# Patient Record
Sex: Female | Born: 1970
Health system: Southern US, Community
[De-identification: ages and names within clinical notes are randomized; demographics above are authoritative.]

## PROBLEM LIST (undated history)

## (undated) DIAGNOSIS — F988 Other specified behavioral and emotional disorders with onset usually occurring in childhood and adolescence: Secondary | ICD-10-CM

## (undated) DIAGNOSIS — Z973 Presence of spectacles and contact lenses: Secondary | ICD-10-CM

## (undated) DIAGNOSIS — C170 Malignant neoplasm of duodenum: Secondary | ICD-10-CM

## (undated) DIAGNOSIS — F419 Anxiety disorder, unspecified: Secondary | ICD-10-CM

## (undated) DIAGNOSIS — Z8489 Family history of other specified conditions: Secondary | ICD-10-CM

## (undated) HISTORY — PX: PERINEAL LACERATION REPAIR: SHX5389

---

## 2000-01-11 ENCOUNTER — Encounter: Payer: Self-pay | Admitting: Family Medicine

## 2000-01-11 ENCOUNTER — Ambulatory Visit (HOSPITAL_COMMUNITY): Admission: RE | Admit: 2000-01-11 | Discharge: 2000-01-11 | Payer: Self-pay | Admitting: Family Medicine

## 2000-02-29 ENCOUNTER — Other Ambulatory Visit: Admission: RE | Admit: 2000-02-29 | Discharge: 2000-02-29 | Payer: Self-pay | Admitting: Obstetrics and Gynecology

## 2004-11-27 ENCOUNTER — Inpatient Hospital Stay (HOSPITAL_COMMUNITY): Admission: AD | Admit: 2004-11-27 | Discharge: 2004-11-27 | Payer: Self-pay | Admitting: Obstetrics and Gynecology

## 2004-12-27 ENCOUNTER — Inpatient Hospital Stay (HOSPITAL_COMMUNITY): Admission: AD | Admit: 2004-12-27 | Discharge: 2004-12-29 | Payer: Self-pay | Admitting: *Deleted

## 2004-12-30 ENCOUNTER — Encounter: Admission: RE | Admit: 2004-12-30 | Discharge: 2005-01-29 | Payer: Self-pay | Admitting: Obstetrics and Gynecology

## 2005-01-27 ENCOUNTER — Other Ambulatory Visit: Admission: RE | Admit: 2005-01-27 | Discharge: 2005-01-27 | Payer: Self-pay | Admitting: Obstetrics and Gynecology

## 2007-06-25 ENCOUNTER — Inpatient Hospital Stay (HOSPITAL_COMMUNITY): Admission: AD | Admit: 2007-06-25 | Discharge: 2007-06-27 | Payer: Self-pay | Admitting: Obstetrics and Gynecology

## 2007-06-28 ENCOUNTER — Encounter: Admission: RE | Admit: 2007-06-28 | Discharge: 2007-07-25 | Payer: Self-pay | Admitting: Obstetrics & Gynecology

## 2007-07-26 ENCOUNTER — Encounter: Admission: RE | Admit: 2007-07-26 | Discharge: 2007-08-25 | Payer: Self-pay | Admitting: Obstetrics & Gynecology

## 2010-10-01 NOTE — Discharge Summary (Signed)
NAMEMarland Houston  DALLAS, SCORSONE NO.:  [id number]   MEDICAL RECORD NO.:  [id number]          PATIENT TYPE:  INP   LOCATION:  9110                          FACILITY:  [facility]   PHYSICIAN:  Malva Limes, M.D.    DATE OF BIRTH:  [birth year]   DATE OF ADMISSION:  12/27/2004  DATE OF DISCHARGE:  12/29/2004                                 DISCHARGE SUMMARY   FINAL DIAGNOSES:  1.  Intrauterine pregnancy at 68 and one-seventh weeks gestation.  2.  Uncomplicated spontaneous vaginal delivery of a 7-pound 5-ounce female      infant with Apgars of 9 and 9.  3.  Postpartum bleeding of left labial laceration and uterine atony.   PROCEDURES:  1.  Delivery performed by Dr. Conley Simmonds.  2.  Manual uterine exploration, suturing left labial laceration in the OR,      Dr. Conley Simmonds.   COMPLICATIONS:  None.   This 40 year old G1 P0 was admitted on December 27, 2004 at 83 and one-seventh  weeks gestation in active labor. The patient's antepartum course up to that  point had been uncomplicated. She had a negative group B strep culture  obtained in the office at 35 weeks. She was admitted at this time. She had  spontaneous vaginal delivery of a 7-pound 5-ounce female infant with Apgars of  9 and 9. The delivery went without complications. At that point, a bilateral  labial laceration was repaired. Dr. Edward Jolly was called later that day. The  patient was still having heavy vaginal bleeding with uterine atony and had  clots per vagina. The patient did receive some Pitocin and did have some  improvement but there was still an excess of blood. On examination there was  no laceration of the cervix, no clots in the vagina, but she was bleeding  from the left labial laceration site. She was taken to the OR at this time  to be examined under anesthesia. The patient was diagnosed with a left  labial vaginal laceration. There was a small hematoma forming in the left  labia and she was taken to the operating room  where manual uterine  exploration was performed with the absence of products of conception from  within the uterine cavity. There was a small clot removed. The cervix was  intact. The left labial laceration was repaired and this provided excellent  hemostasis. The patient's postpartum and postoperative courses were benign.  Her hemoglobin did drop to 8.2. She was felt ready for discharge on  postoperative day #2. She was sent home on a regular diet, told to decrease  activities, told to continue her prenatal vitamins. She needed to add an  iron supplement 325 milligrams twice daily, was given Percocet one to two  every 4 hours as needed for pain, told she could use over-the-counter  ibuprofen up to 600 milligrams every 6 hours as needed for pain. Was to  follow up in the office in 4 weeks.   LABORATORY ON DISCHARGE:  The patient had a hemoglobin of 8.2; white blood  cell count of 13.3; and platelets of 221,000.  Leilani Able, P.A.-C.    ______________________________  Malva Limes, M.D.    MB/MEDQ  D:  02/03/2005  T:  02/03/2005  Job:  [id number]

## 2010-10-01 NOTE — Op Note (Signed)
NAME:  Hailey Houston, Hailey Houston NO.:  [id number]   MEDICAL RECORD NO.:  [id number]          PATIENT TYPE:  INP   LOCATION:  9110                          FACILITY:  [facility]   PHYSICIAN:  Randye Lobo, M.D.   DATE OF BIRTH:  08-26-1970   DATE OF PROCEDURE:  12/27/2004  DATE OF DISCHARGE:                                 OPERATIVE REPORT   PREOPERATIVE DIAGNOSES:  1.  Status post spontaneous vaginal delivery.  2.  Bleeding left vaginal labial laceration.  3.  Uterine atony.   POSTOPERATIVE DIAGNOSES:  1.  Status post normal spontaneous vaginal delivery.  2.  Bleeding left vaginal labial laceration.  3.  Uterine atony.   PROCEDURES:  1.  Examination under anesthesia.  2.  Manual uterine exploration.  3.  Suturing of left vaginal labial laceration.   SURGEON:  Conley Simmonds, M.D.   ANESTHESIA:  IV sedation.   ESTIMATED BLOOD LOSS:  300 mL  of clot per vagina at the beginning of the  procedure.   URINE OUTPUT:  300 mL  by I&O catheterization.   COMPLICATIONS:  None.   INDICATIONS FOR PROCEDURE:  The patient is a 40 year old gravida 1, para 2,  Caucasian female admitted on December 27, 2004, at 24 plus one weeks'  gestation, who had a normal spontaneous vaginal delivery of a viable female at  47 with Apgars of 9 at one minute and 9 at five minutes, status post  bilateral labial laceration repair with 2-0 Vicryl, who was noted to have  uterine atony with vaginal bleeding and clots per vagina later that evening.  The patient did receive Pitocin intravenously and had some improvement of  the bleeding but still had a constant vaginal flow of blood.  The patient  was feeling well.   Upon examination the patient had a temperature of 99 degrees Fahrenheit and  vital signs which were stable.  The patient was examined vaginally, and a  ring forceps was placed on the anterior cervical lip.  There were no cervical lacerations.  The perineum was noted to be intact.  The right  vaginal laceration site was hemostatic.  The left labial  laceration site was examined and was found to have an arteriolar bleeder.   The patient was diagnosed with a bleeding left labial vaginal laceration,  and the plan was made to perform suturing at the bedside.  Local 1%  lidocaine was injected to the left labia and a series of figure-of-eight and  simple interrupted sutures of 2-0 Vicryl were placed.  Bleeding continued at  this time.  There was a small hematoma forming of the left labia.   A decision was made at this time to proceed with an examination under  anesthesia along with suturing of the left vaginal labial laceration after  risks and benefits were reviewed.   FINDINGS:  Manual uterine exploration at the beginning of the patient's  procedure documented the absence of products of conception from within the  uterine cavity.  A very small amount of clot was removed.  The cervix was  noted to be intact.  The left vaginal labial laceration was noted to be  bleeding as had been previously diagnosed in the patient's postpartum room.   SPECIMEN:  None.   PROCEDURE:  The patient was escorted from her postpartum room down to the  operating room.  The patient was placed in the dorsal lithotomy position and  IV sedation was administered.  The patient's vagina and perineum were then  sterilely prepped and the bladder was sterilely catheterized of urine.  She  was then sterilely draped.   A manual uterine exploration was performed first, and the findings ere as  noted above.  The cervix was then visualized with a ring forceps, and there  was no evidence of any bleeding from the cervix itself.  A series of figure-  of-eight and interrupted sutures of #2 chromic were placed along the left  labial vaginal laceration site.  This provided excellent hemostasis.   The patient was observed following completion of the suturing, and there was  no further bleeding appreciated.   This  concluded the patient's surgery.  There were no complications.  She is  escorted to the recovery room in stable and awake condition.  All needle,  instrument, sponge counts were correct.      Randye Lobo, M.D.  Electronically Signed     BES/MEDQ  D:  12/27/2004  T:  12/28/2004  Job:  [id number]

## 2011-02-04 LAB — CBC
HCT: 28.3 — ABNORMAL LOW
HCT: 34 — ABNORMAL LOW
Hemoglobin: 11.6 — ABNORMAL LOW
Hemoglobin: 9.7 — ABNORMAL LOW
MCHC: 34
MCHC: 34.1
MCV: 91.5
MCV: 92.1
Platelets: 262
Platelets: 327
RBC: 3.1 — ABNORMAL LOW
RBC: 3.69 — ABNORMAL LOW
RDW: 13.7
RDW: 13.9
WBC: 12.3 — ABNORMAL HIGH
WBC: 14.8 — ABNORMAL HIGH

## 2011-02-04 LAB — RPR: RPR Ser Ql: NONREACTIVE

## 2013-08-26 ENCOUNTER — Emergency Department (HOSPITAL_COMMUNITY)
Admission: EM | Admit: 2013-08-26 | Discharge: 2013-08-26 | Disposition: A | Discharge status: Home / Self Care | Payer: BC Managed Care – PPO | Attending: Emergency Medicine | Admitting: Emergency Medicine

## 2013-08-26 ENCOUNTER — Emergency Department (HOSPITAL_COMMUNITY): Payer: BC Managed Care – PPO

## 2013-08-26 ENCOUNTER — Encounter (HOSPITAL_COMMUNITY): Payer: Self-pay | Admitting: Emergency Medicine

## 2013-08-26 DIAGNOSIS — W010XXA Fall on same level from slipping, tripping and stumbling without subsequent striking against object, initial encounter: Secondary | ICD-10-CM | POA: Insufficient documentation

## 2013-08-26 DIAGNOSIS — F17200 Nicotine dependence, unspecified, uncomplicated: Secondary | ICD-10-CM | POA: Insufficient documentation

## 2013-08-26 DIAGNOSIS — S82209A Unspecified fracture of shaft of unspecified tibia, initial encounter for closed fracture: Secondary | ICD-10-CM | POA: Insufficient documentation

## 2013-08-26 DIAGNOSIS — S82202A Unspecified fracture of shaft of left tibia, initial encounter for closed fracture: Secondary | ICD-10-CM

## 2013-08-26 DIAGNOSIS — Y939 Activity, unspecified: Secondary | ICD-10-CM | POA: Insufficient documentation

## 2013-08-26 DIAGNOSIS — R110 Nausea: Secondary | ICD-10-CM | POA: Insufficient documentation

## 2013-08-26 DIAGNOSIS — Z79899 Other long term (current) drug therapy: Secondary | ICD-10-CM | POA: Insufficient documentation

## 2013-08-26 DIAGNOSIS — S82409A Unspecified fracture of shaft of unspecified fibula, initial encounter for closed fracture: Principal | ICD-10-CM

## 2013-08-26 DIAGNOSIS — R42 Dizziness and giddiness: Secondary | ICD-10-CM | POA: Insufficient documentation

## 2013-08-26 DIAGNOSIS — Y92009 Unspecified place in unspecified non-institutional (private) residence as the place of occurrence of the external cause: Secondary | ICD-10-CM | POA: Insufficient documentation

## 2013-08-26 DIAGNOSIS — S82402A Unspecified fracture of shaft of left fibula, initial encounter for closed fracture: Secondary | ICD-10-CM

## 2013-08-26 DIAGNOSIS — F988 Other specified behavioral and emotional disorders with onset usually occurring in childhood and adolescence: Secondary | ICD-10-CM | POA: Insufficient documentation

## 2013-08-26 DIAGNOSIS — S9000XA Contusion of unspecified ankle, initial encounter: Secondary | ICD-10-CM | POA: Insufficient documentation

## 2013-08-26 HISTORY — DX: Other specified behavioral and emotional disorders with onset usually occurring in childhood and adolescence: F98.8

## 2013-08-26 MED ORDER — FENTANYL CITRATE 0.05 MG/ML IJ SOLN
100.0000 ug | Freq: Once | INTRAMUSCULAR | Status: AC
  Administered 2013-08-26: 100 ug via INTRAVENOUS
  Filled 2013-08-26: qty 2

## 2013-08-26 MED ORDER — FENTANYL CITRATE 0.05 MG/ML IJ SOLN
50.0000 ug | Freq: Once | INTRAMUSCULAR | Status: AC
  Administered 2013-08-26: 50 ug via INTRAVENOUS
  Filled 2013-08-26: qty 2

## 2013-08-26 MED ORDER — HYDROMORPHONE HCL PF 1 MG/ML IJ SOLN
1.0000 mg | Freq: Once | INTRAMUSCULAR | Status: AC
  Administered 2013-08-26: 1 mg via INTRAVENOUS
  Filled 2013-08-26: qty 1

## 2013-08-26 MED ORDER — HYDROMORPHONE HCL PF 1 MG/ML IJ SOLN
1.0000 mg | INTRAMUSCULAR | Status: AC | PRN
  Administered 2013-08-26: 1 mg via INTRAVENOUS
  Filled 2013-08-26: qty 1

## 2013-08-26 MED ORDER — OXYCODONE-ACETAMINOPHEN 5-325 MG PO TABS: 2.0000 | ORAL_TABLET | ORAL | Status: DC | PRN

## 2013-08-26 MED ORDER — OXYCODONE-ACETAMINOPHEN 5-325 MG PO TABS
2.0000 | ORAL_TABLET | Freq: Once | ORAL | Status: AC
  Administered 2013-08-26: 2 via ORAL
  Filled 2013-08-26: qty 2

## 2013-08-26 NOTE — ED Notes (Signed)
Patient slipped on porch and fell, has obvious deformity to left ankle

## 2013-08-26 NOTE — ED Provider Notes (Addendum)
Medical screening examination/treatment/procedure(s) were conducted as a shared visit with non-physician practitioner(s) and myself.  I personally evaluated the patient during the encounter.   EKG Interpretation None      Dr. Percell Miller evaluated xrays and recommends action during splinting with pain medicine. This was performed by me in good anatomical alignment was achieved. The patient remained or vascular intact. Her compartments are soft. Her pain did improve somewhat with IV pain medications and reduction. It was then splinted by orthostatics. Dr. Percell Miller evaluated the reduction xrays and will see the patient in 2 days for surgery. The patient feels that her pain is controlled well enough that she shouldn't take oral meds at home. I discussed her to return precautions regarding worsening pain or concerns for compartment syndrome. The patient understands these will return if symptoms worsen.  Reduction of dislocation Date/Time: 10:45 PM Performed by: Ephraim Hamburger Authorized by: Ephraim Hamburger Consent: Verbal consent obtained. Risks and benefits: risks, benefits and alternatives were discussed Consent given by: patient Required items: required blood products, implants, devices, and special equipment available Time out: Immediately prior to procedure a "time out" was called to verify the correct patient, procedure, equipment, support staff and site/side marked as required.  Patient sedated: Dilaudid IV, total time 5 minutes  Vitals: Vital signs were monitored during sedation. Patient tolerance: Patient tolerated the procedure well with no immediate complications. Joint: Distal left tib/fib fracture with displacement above ankle joint Reduction technique: Traction    Ephraim Hamburger, MD 08/26/13 8325  Ephraim Hamburger, MD 09/04/13 1530

## 2013-08-26 NOTE — Progress Notes (Signed)
Orthopedic Tech Progress Note Patient Details:  Hailey Houston [birth year] [id number]  Ortho Devices Type of Ortho Device: Ace wrap;Crutches;Stirrup splint;Post (short leg) splint Ortho Device/Splint Location: LLE Ortho Device/Splint Interventions: Ordered;Application;Adjustment   Braulio Bosch 08/26/2013, 9:04 PM

## 2013-08-26 NOTE — Discharge Instructions (Signed)
Cast or Splint Care  Casts and splints support injured limbs and keep bones from moving while they heal. It is important to care for your cast or splint at home.   HOME CARE INSTRUCTIONS   Keep the cast or splint uncovered during the drying period. It can take 24 to 48 hours to dry if it is made of plaster. A fiberglass cast will dry in less than 1 hour.   Do not rest the cast on anything harder than a pillow for the first 24 hours.   Do not put weight on your injured limb or apply pressure to the cast until your health care provider gives you permission.   Keep the cast or splint dry. Wet casts or splints can lose their shape and may not support the limb as well. A wet cast that has lost its shape can also create harmful pressure on your skin when it dries. Also, wet skin can become infected.   Cover the cast or splint with a plastic bag when bathing or when out in the rain or snow. If the cast is on the trunk of the body, take sponge baths until the cast is removed.   If your cast does become wet, dry it with a towel or a blow dryer on the cool setting only.   Keep your cast or splint clean. Soiled casts may be wiped with a moistened cloth.   Do not place any hard or soft foreign objects under your cast or splint, such as cotton, toilet paper, lotion, or powder.   Do not try to scratch the skin under the cast with any object. The object could get stuck inside the cast. Also, scratching could lead to an infection. If itching is a problem, use a blow dryer on a cool setting to relieve discomfort.   Do not trim or cut your cast or remove padding from inside of it.   Exercise all joints next to the injury that are not immobilized by the cast or splint. For example, if you have a long leg cast, exercise the hip joint and toes. If you have an arm cast or splint, exercise the shoulder, elbow, thumb, and fingers.   Elevate your injured arm or leg on 1 or 2 pillows for the first 1 to 3 days to decrease  swelling and pain.It is best if you can comfortably elevate your cast so it is higher than your heart.  SEEK MEDICAL CARE IF:    Your cast or splint cracks.   Your cast or splint is too tight or too loose.   You have unbearable itching inside the cast.   Your cast becomes wet or develops a soft spot or area.   You have a bad smell coming from inside your cast.   You get an object stuck under your cast.   Your skin around the cast becomes red or raw.   You have new pain or worsening pain after the cast has been applied.  SEEK IMMEDIATE MEDICAL CARE IF:    You have fluid leaking through the cast.   You are unable to move your fingers or toes.   You have discolored (blue or white), cool, painful, or very swollen fingers or toes beyond the cast.   You have tingling or numbness around the injured area.   You have severe pain or pressure under the cast.   You have any difficulty with your breathing or have shortness of breath.   You have chest 

## 2013-08-26 NOTE — ED Notes (Signed)
Pt told this RN that she drank vodka before coming to hospital for pain control from fall. PA aware.

## 2013-08-26 NOTE — ED Provider Notes (Signed)
CSN: [id number]     Arrival date & time 08/26/13  1815 History   First MD Initiated Contact with Patient 08/26/13 1833     Chief Complaint  Patient presents with  . Ankle Pain     (Consider location/radiation/quality/duration/timing/severity/associated sxs/prior Treatment) HPI Comments: Pt is a 43 y/o female who presents to the ED complaining of left ankle pain after slipping off her porch and falling onto her leg. States her pain is severe, worse with any movement. She has not had any medication prior to arrival but did have less than an ounce of vodka to try to ease the pain. States when she looks down at her ankle looks deformed, swollen and bruised. She is feeling lightheaded and nauseated. Denies numbness. Denies knee pain. States she slipped and fell 1 week ago and bruised her lower leg.  Patient is a 43 y.o. female presenting with ankle pain. The history is provided by the patient.  Ankle Pain   Past Medical History  Diagnosis Date  . ADD (attention deficit disorder)    History reviewed. No pertinent past surgical history. History reviewed. No pertinent family history. History  Substance Use Topics  . Smoking status: Current Every Day Smoker  . Smokeless tobacco: Not on file  . Alcohol Use: Yes   OB History   Grav Para Term Preterm Abortions TAB SAB Ect Mult Living                 Review of Systems  Gastrointestinal: Positive for nausea.  Musculoskeletal:       Positive for left ankle pain and swelling.  Neurological: Positive for light-headedness.  All other systems reviewed and are negative.     Allergies  Review of patient's allergies indicates no known allergies.  Home Medications   Current Outpatient Rx  Name  Route  Sig  Dispense  Refill  . ALPRAZolam (XANAX) 0.5 MG tablet   Oral   Take 0.5 mg by mouth 2 (two) times daily as needed for anxiety.         Marland Kitchen amphetamine-dextroamphetamine (ADDERALL XR) 30 MG 24 hr capsule   Oral   Take 30 mg by mouth  every morning. Take 30mg  xr in the mornings, and regular 30mg  in the evening twice a day         . amphetamine-dextroamphetamine (ADDERALL) 30 MG tablet   Oral   Take 30 mg by mouth 2 (two) times daily.         . cetirizine (ZYRTEC) 10 MG tablet   Oral   Take 10 mg by mouth daily as needed for allergies.         Marland Kitchen levonorgestrel (MIRENA) 20 MCG/24HR IUD   Intrauterine   1 each by Intrauterine route once.         . venlafaxine (EFFEXOR) 75 MG tablet   Oral   Take 75 mg by mouth 4 (four) times daily.         . zaleplon (SONATA) 10 MG capsule   Oral   Take 10 mg by mouth at bedtime as needed for sleep.         Marland Kitchen oxyCODONE-acetaminophen (PERCOCET) 5-325 MG per tablet   Oral   Take 2 tablets by mouth every 4 (four) hours as needed.   30 tablet   0    BP 118/69  Pulse 105  Temp(Src) 98.2 F (36.8 C) (Oral)  Resp 18  Ht 5\' 11"  (1.803 m)  Wt 180 lb (81.647 kg)  BMI  25.12 kg/m2  SpO2 97%  LMP 02/26/2007 Physical Exam  Nursing note and vitals reviewed. Constitutional: She is oriented to person, place, and time. She appears well-developed and well-nourished. She appears distressed.  HENT:  Head: Normocephalic and atraumatic.  Mouth/Throat: Oropharynx is clear and moist.  Eyes: Conjunctivae are normal.  Neck: Normal range of motion. Neck supple.  Cardiovascular: Normal rate, regular rhythm and normal heart sounds.   Pulses:      Dorsalis pedis pulses are 2+ on the left side.  Pulmonary/Chest: Effort normal and breath sounds normal.  Musculoskeletal:  Left foot rotated outward with deformity at ankle, overlying bruising and swelling. Tenderness to palpation of entire ankle. TTP lower leg. Bruising noted proximal tibia.  Neurological: She is alert and oriented to person, place, and time.  Skin: Skin is warm and dry. She is not diaphoretic.  Psychiatric: She has a normal mood and affect. Her behavior is normal.    ED Course  Procedures (including critical care  time) Labs Review Labs Reviewed - No data to display Imaging Review Dg Tibia/fibula Left Port  08/26/2013   CLINICAL DATA:  Recent injury with pain  EXAM: PORTABLE LEFT TIBIA AND FIBULA - 2 VIEW  COMPARISON:  None.  FINDINGS: Comminuted fractures of the distal tibia and fibula are noted. Some lateral displacement of the distal fracture fragments is seen. Additionally posterior angulation of the distal fracture fragments is noted. No other focal abnormality is seen.  IMPRESSION: Distal left tibial and fibular fractures.   Electronically Signed   By: Inez Catalina M.D.   On: 08/26/2013 20:43   Dg Ankle Left Port  08/26/2013   CLINICAL DATA:  Postreduction film  EXAM: PORTABLE LEFT ANKLE - 2 VIEW  COMPARISON:  DG TIBIA/FIBULA*L*PORT dated 08/26/2013  FINDINGS: There is an oblique comminuted distal left tibial diametaphyseal fracture with improved alignment with minimal displacement. No significant angulation. There is a oblique comminuted displaced fracture of the left distal fibular diaphysis with 5 mm of lateral displacement and 9 mm of posterior displacement.  IMPRESSION: 1. Oblique comminuted distal left tibial diametaphyseal fracture with improved interval alignment. 2. Oblique comminuted displaced fracture of the left distal fibular diaphysis with improved alignment. There is persistent displacement. No significant angulation.   Electronically Signed   By: Kathreen Devoid   On: 08/26/2013 21:31   Dg Ankle Left Port  08/26/2013   CLINICAL DATA:  Pain and deformity secondary to a fall tonight.  EXAM: PORTABLE LEFT ANKLE - 2 VIEW  COMPARISON:  None.  FINDINGS: There are angulated displaced comminuted fractures of the distal shafts of the tibia and fibula. The fractures do not appear to involve the ankle joint.  IMPRESSION: Comminuted fractures of the distal shafts of the tibia and fibula.   Electronically Signed   By: Rozetta Nunnery M.D.   On: 08/26/2013 20:37     EKG Interpretation None      MDM    Final diagnoses:  Closed fracture of left tibia and fibula   Neurovascularly intact. Pain controlled with dilaudid. Awaiting ortho consult. Pt signed out to Dr. Regenia Skeeter at shift change.     Illene Labrador, PA-C 08/27/13 [phone]

## 2013-08-26 NOTE — ED Notes (Signed)
Pt states she hurt her L ankle falling down the stairs to go get the mail. Deformity and swelling present to L ankle.

## 2013-08-28 ENCOUNTER — Encounter (HOSPITAL_BASED_OUTPATIENT_CLINIC_OR_DEPARTMENT_OTHER): Payer: Self-pay | Admitting: *Deleted

## 2013-08-28 NOTE — H&P (Signed)
ORTHOPAEDIC CONSULTATION  REQUESTING PHYSICIAN: [name], MD  Chief Complaint: Left tibia fx, lat mal fx  HPI: Hailey Houston is a 43 y.o. female who complains of  pain  Past Medical History  Diagnosis Date  . ADD (attention deficit disorder)    No past surgical history on file. History   Social History  . Marital Status: Married    Spouse Name: N/A    Number of Children: N/A  . Years of Education: N/A   Social History Main Topics  . Smoking status: Current Every Day Smoker  . Smokeless tobacco: Not on file  . Alcohol Use: Yes  . Drug Use: No  . Sexual Activity: Not on file   Other Topics Concern  . Not on file   Social History Narrative  . No narrative on file   No family history on file. No Known Allergies Prior to Admission medications   Medication Sig Start Date End Date Taking? Authorizing Provider  ALPRAZolam Duanne Moron) 0.5 MG tablet Take 0.5 mg by mouth 2 (two) times daily as needed for anxiety.    Historical Provider, MD  amphetamine-dextroamphetamine (ADDERALL XR) 30 MG 24 hr capsule Take 30 mg by mouth every morning. Take 38m xr in the mornings, and regular 357min the evening twice a day    Historical Provider, MD  amphetamine-dextroamphetamine (ADDERALL) 30 MG tablet Take 30 mg by mouth 2 (two) times daily.    Historical Provider, MD  cetirizine (ZYRTEC) 10 MG tablet Take 10 mg by mouth daily as needed for allergies.    Historical Provider, MD  levonorgestrel (MIRENA) 20 MCG/24HR IUD 1 each by Intrauterine route once.    Historical Provider, MD  oxyCODONE-acetaminophen (PERCOCET) 5-325 MG per tablet Take 2 tablets by mouth every 4 (four) hours as needed. 08/26/13   ScEphraim HamburgerMD  venlafaxine (EFFEXOR) 75 MG tablet Take 75 mg by mouth 4 (four) times daily.    Historical Provider, MD  zaleplon (SONATA) 10 MG capsule Take 10 mg by mouth at bedtime as needed for sleep.    Historical Provider, MD   Dg Tibia/fibula Left Port  08/26/2013    CLINICAL DATA:  Recent injury with pain  EXAM: PORTABLE LEFT TIBIA AND FIBULA - 2 VIEW  COMPARISON:  None.  FINDINGS: Comminuted fractures of the distal tibia and fibula are noted. Some lateral displacement of the distal fracture fragments is seen. Additionally posterior angulation of the distal fracture fragments is noted. No other focal abnormality is seen.  IMPRESSION: Distal left tibial and fibular fractures.   Electronically Signed   By: MaInez Catalina.D.   On: 08/26/2013 20:43   Dg Ankle Left Port  08/26/2013   CLINICAL DATA:  Postreduction film  EXAM: PORTABLE LEFT ANKLE - 2 VIEW  COMPARISON:  DG TIBIA/FIBULA*L*PORT dated 08/26/2013  FINDINGS: There is an oblique comminuted distal left tibial diametaphyseal fracture with improved alignment with minimal displacement. No significant angulation. There is a oblique comminuted displaced fracture of the left distal fibular diaphysis with 5 mm of lateral displacement and 9 mm of posterior displacement.  IMPRESSION: 1. Oblique comminuted distal left tibial diametaphyseal fracture with improved interval alignment. 2. Oblique comminuted displaced fracture of the left distal fibular diaphysis with improved alignment. There is persistent displacement. No significant angulation.   Electronically Signed   By: HeKathreen Devoid On: 08/26/2013 21:31   Dg Ankle Left Port  08/26/2013   CLINICAL DATA:  Pain and deformity  secondary to a fall tonight.  EXAM: PORTABLE LEFT ANKLE - 2 VIEW  COMPARISON:  None.  FINDINGS: There are angulated displaced comminuted fractures of the distal shafts of the tibia and fibula. The fractures do not appear to involve the ankle joint.  IMPRESSION: Comminuted fractures of the distal shafts of the tibia and fibula.   Electronically Signed   By: Rozetta Nunnery M.D.   On: 08/26/2013 20:37    Positive ROS: All other systems have been reviewed and were otherwise negative with the exception of those mentioned in the HPI and as above.  Labs cbc No  results found for this basename: WBC, HGB, HCT, PLT,  in the last 72 hours  Labs inflam No results found for this basename: ESR, CRP,  in the last 72 hours  Labs coag No results found for this basename: INR, PT, PTT,  in the last 72 hours  No results found for this basename: NA, K, CL, CO2, GLUCOSE, BUN, CREATININE, CALCIUM,  in the last 72 hours  Physical Exam: There were no vitals filed for this visit. General: Alert, no acute distress Cardiovascular: No pedal edema Respiratory: No cyanosis, no use of accessory musculature GI: No organomegaly, abdomen is soft and non-tender Skin: No lesions in the area of chief complaint Neurologic: Sensation intact distally Psychiatric: Patient is competent for consent with normal mood and affect Lymphatic: No axillary or cervical lymphadenopathy  MUSCULOSKELETAL:  Toes NVI, splint intact Other extremities are atraumatic with painless ROM and NVI.  Assessment: Left tibia and lat mal fx  Plan: IMnail tibia, ORIF lat mal    [name], MD Cell [phone]   08/28/2013 1:58 PM

## 2013-08-28 NOTE — Progress Notes (Signed)
Pt has been in a lot of pain-will bring all meds overnight bag and plan to stay-mom coming from [facility] will stay with her

## 2013-08-29 ENCOUNTER — Ambulatory Visit (HOSPITAL_BASED_OUTPATIENT_CLINIC_OR_DEPARTMENT_OTHER)
Admission: RE | Admit: 2013-08-29 | Discharge: 2013-08-30 | Disposition: A | Discharge status: Home / Self Care | Payer: BC Managed Care – PPO | Source: Ambulatory Visit | Attending: Orthopedic Surgery | Admitting: Orthopedic Surgery

## 2013-08-29 ENCOUNTER — Encounter (HOSPITAL_BASED_OUTPATIENT_CLINIC_OR_DEPARTMENT_OTHER): Payer: Self-pay | Admitting: *Deleted

## 2013-08-29 ENCOUNTER — Ambulatory Visit (HOSPITAL_BASED_OUTPATIENT_CLINIC_OR_DEPARTMENT_OTHER): Payer: BC Managed Care – PPO | Admitting: Anesthesiology

## 2013-08-29 ENCOUNTER — Encounter (HOSPITAL_BASED_OUTPATIENT_CLINIC_OR_DEPARTMENT_OTHER): Payer: BC Managed Care – PPO | Admitting: Anesthesiology

## 2013-08-29 ENCOUNTER — Ambulatory Visit (HOSPITAL_COMMUNITY): Payer: BC Managed Care – PPO

## 2013-08-29 ENCOUNTER — Encounter (HOSPITAL_BASED_OUTPATIENT_CLINIC_OR_DEPARTMENT_OTHER)
Admission: RE | Disposition: A | Discharge status: Home / Self Care | Payer: Self-pay | Source: Ambulatory Visit | Attending: Orthopedic Surgery

## 2013-08-29 DIAGNOSIS — S82409A Unspecified fracture of shaft of unspecified fibula, initial encounter for closed fracture: Secondary | ICD-10-CM

## 2013-08-29 DIAGNOSIS — F17200 Nicotine dependence, unspecified, uncomplicated: Secondary | ICD-10-CM | POA: Insufficient documentation

## 2013-08-29 DIAGNOSIS — S82209A Unspecified fracture of shaft of unspecified tibia, initial encounter for closed fracture: Secondary | ICD-10-CM | POA: Diagnosis present

## 2013-08-29 DIAGNOSIS — F411 Generalized anxiety disorder: Secondary | ICD-10-CM | POA: Insufficient documentation

## 2013-08-29 DIAGNOSIS — S8263XA Displaced fracture of lateral malleolus of unspecified fibula, initial encounter for closed fracture: Secondary | ICD-10-CM | POA: Insufficient documentation

## 2013-08-29 DIAGNOSIS — X58XXXA Exposure to other specified factors, initial encounter: Secondary | ICD-10-CM | POA: Insufficient documentation

## 2013-08-29 DIAGNOSIS — F988 Other specified behavioral and emotional disorders with onset usually occurring in childhood and adolescence: Secondary | ICD-10-CM | POA: Insufficient documentation

## 2013-08-29 HISTORY — DX: Anxiety disorder, unspecified: F41.9

## 2013-08-29 HISTORY — PX: TIBIA IM NAIL INSERTION: SHX2516

## 2013-08-29 HISTORY — PX: ORIF ANKLE FRACTURE: SHX5408

## 2013-08-29 HISTORY — DX: Presence of spectacles and contact lenses: Z97.3

## 2013-08-29 LAB — POCT HEMOGLOBIN-HEMACUE: Hemoglobin: 12.2 g/dL (ref 12.0–15.0)

## 2013-08-29 SURGERY — OPEN REDUCTION INTERNAL FIXATION (ORIF) ANKLE FRACTURE
Anesthesia: Regional | Site: Leg Lower | Laterality: Left

## 2013-08-29 MED ORDER — BUPIVACAINE HCL 0.25 % IJ SOLN
INTRAMUSCULAR | Status: DC | PRN
  Administered 2013-08-29: 20 mL

## 2013-08-29 MED ORDER — FENTANYL CITRATE 0.05 MG/ML IJ SOLN
INTRAMUSCULAR | Status: AC
  Filled 2013-08-29: qty 2

## 2013-08-29 MED ORDER — VENLAFAXINE HCL 75 MG PO TABS
300.0000 mg | ORAL_TABLET | Freq: Every day | ORAL | Status: DC
  Administered 2013-08-29: 300 mg via ORAL

## 2013-08-29 MED ORDER — MIDAZOLAM HCL 2 MG/2ML IJ SOLN
INTRAMUSCULAR | Status: AC
  Filled 2013-08-29: qty 2

## 2013-08-29 MED ORDER — FENTANYL CITRATE 0.05 MG/ML IJ SOLN
50.0000 ug | INTRAMUSCULAR | Status: DC | PRN
  Administered 2013-08-29 (×2): 100 ug via INTRAVENOUS

## 2013-08-29 MED ORDER — ASPIRIN 81 MG PO CHEW
81.0000 mg | CHEWABLE_TABLET | Freq: Every day | ORAL | Status: DC
  Administered 2013-08-29: 81 mg via ORAL
  Filled 2013-08-29: qty 1

## 2013-08-29 MED ORDER — ACETAMINOPHEN 500 MG PO TABS
1000.0000 mg | ORAL_TABLET | Freq: Once | ORAL | Status: AC
Start: 2013-08-29 — End: 2013-08-29
  Administered 2013-08-29: 1000 mg via ORAL

## 2013-08-29 MED ORDER — PROPOFOL 10 MG/ML IV BOLUS
INTRAVENOUS | Status: AC
  Filled 2013-08-29: qty 20

## 2013-08-29 MED ORDER — DIAZEPAM 2 MG PO TABS
2.0000 mg | ORAL_TABLET | Freq: Four times a day (QID) | ORAL | Status: DC | PRN
  Administered 2013-08-29 – 2013-08-30 (×2): 2 mg via ORAL
  Filled 2013-08-29 (×2): qty 1

## 2013-08-29 MED ORDER — DEXAMETHASONE SODIUM PHOSPHATE 4 MG/ML IJ SOLN
INTRAMUSCULAR | Status: DC | PRN
  Administered 2013-08-29: 10 mg via INTRAVENOUS

## 2013-08-29 MED ORDER — METOCLOPRAMIDE HCL 5 MG/ML IJ SOLN: 5.0000 mg | Freq: Three times a day (TID) | INTRAMUSCULAR | Status: DC | PRN

## 2013-08-29 MED ORDER — BUPIVACAINE-EPINEPHRINE PF 0.5-1:200000 % IJ SOLN
INTRAMUSCULAR | Status: DC | PRN
  Administered 2013-08-29: 30 mL via PERINEURAL

## 2013-08-29 MED ORDER — CEFAZOLIN SODIUM-DEXTROSE 2-3 GM-% IV SOLR
2.0000 g | Freq: Four times a day (QID) | INTRAVENOUS | Status: AC
  Administered 2013-08-29 – 2013-08-30 (×3): 2 g via INTRAVENOUS

## 2013-08-29 MED ORDER — METOCLOPRAMIDE HCL 5 MG PO TABS: 5.0000 mg | ORAL_TABLET | Freq: Three times a day (TID) | ORAL | Status: DC | PRN

## 2013-08-29 MED ORDER — CEFAZOLIN SODIUM-DEXTROSE 2-3 GM-% IV SOLR
2.0000 g | INTRAVENOUS | Status: AC
  Administered 2013-08-29: 2 g via INTRAVENOUS

## 2013-08-29 MED ORDER — ASPIRIN 81 MG PO TABS: 81.0000 mg | ORAL_TABLET | Freq: Every day | ORAL | Status: DC

## 2013-08-29 MED ORDER — HYDROMORPHONE HCL PF 1 MG/ML IJ SOLN
0.2500 mg | INTRAMUSCULAR | Status: DC | PRN
  Administered 2013-08-29 (×2): 0.5 mg via INTRAVENOUS

## 2013-08-29 MED ORDER — FENTANYL CITRATE 0.05 MG/ML IJ SOLN
INTRAMUSCULAR | Status: DC | PRN
  Administered 2013-08-29 (×4): 25 ug via INTRAVENOUS
  Administered 2013-08-29: 100 ug via INTRAVENOUS
  Administered 2013-08-29 (×2): 25 ug via INTRAVENOUS

## 2013-08-29 MED ORDER — DEXTROSE-NACL 5-0.45 % IV SOLN: 100.0000 mL/h | INTRAVENOUS | Status: DC

## 2013-08-29 MED ORDER — BUPIVACAINE HCL (PF) 0.5 % IJ SOLN
INTRAMUSCULAR | Status: DC | PRN
  Administered 2013-08-29: 10 mL

## 2013-08-29 MED ORDER — PROMETHAZINE HCL 25 MG PO TABS
25.0000 mg | ORAL_TABLET | Freq: Four times a day (QID) | ORAL | Status: DC | PRN
  Administered 2013-08-30: 25 mg via ORAL
  Filled 2013-08-29: qty 1

## 2013-08-29 MED ORDER — HYDROCODONE-ACETAMINOPHEN 5-325 MG PO TABS: 2.0000 | ORAL_TABLET | ORAL | Status: DC | PRN

## 2013-08-29 MED ORDER — FENTANYL CITRATE 0.05 MG/ML IJ SOLN
INTRAMUSCULAR | Status: AC
  Filled 2013-08-29: qty 6

## 2013-08-29 MED ORDER — BUPIVACAINE HCL (PF) 0.25 % IJ SOLN
INTRAMUSCULAR | Status: AC
  Filled 2013-08-29: qty 30

## 2013-08-29 MED ORDER — CYCLOBENZAPRINE HCL 10 MG PO TABS
10.0000 mg | ORAL_TABLET | Freq: Three times a day (TID) | ORAL | Status: DC | PRN
  Administered 2013-08-30 (×2): 10 mg via ORAL
  Filled 2013-08-29 (×2): qty 1

## 2013-08-29 MED ORDER — CEFAZOLIN SODIUM-DEXTROSE 2-3 GM-% IV SOLR
INTRAVENOUS | Status: AC
  Filled 2013-08-29: qty 100

## 2013-08-29 MED ORDER — CEFAZOLIN SODIUM-DEXTROSE 2-3 GM-% IV SOLR
INTRAVENOUS | Status: AC
  Filled 2013-08-29: qty 50

## 2013-08-29 MED ORDER — ACETAMINOPHEN 500 MG PO TABS
ORAL_TABLET | ORAL | Status: AC
  Filled 2013-08-29: qty 2

## 2013-08-29 MED ORDER — DOCUSATE SODIUM 100 MG PO CAPS: 100.0000 mg | ORAL_CAPSULE | Freq: Two times a day (BID) | ORAL | Status: DC

## 2013-08-29 MED ORDER — PROPOFOL 10 MG/ML IV BOLUS
INTRAVENOUS | Status: DC | PRN
  Administered 2013-08-29: 200 mg via INTRAVENOUS
  Administered 2013-08-29: 50 mg via INTRAVENOUS
  Administered 2013-08-29 (×2): 30 mg via INTRAVENOUS

## 2013-08-29 MED ORDER — ONDANSETRON HCL 4 MG PO TABS: 4.0000 mg | ORAL_TABLET | Freq: Three times a day (TID) | ORAL | Status: DC | PRN

## 2013-08-29 MED ORDER — DIPHENHYDRAMINE HCL 25 MG PO TABS
25.0000 mg | ORAL_TABLET | Freq: Four times a day (QID) | ORAL | Status: DC | PRN
  Administered 2013-08-29 – 2013-08-30 (×2): 25 mg via ORAL
  Filled 2013-08-29 (×2): qty 1

## 2013-08-29 MED ORDER — ONDANSETRON HCL 4 MG/2ML IJ SOLN: 4.0000 mg | Freq: Four times a day (QID) | INTRAMUSCULAR | Status: DC | PRN

## 2013-08-29 MED ORDER — HYDROMORPHONE HCL PF 1 MG/ML IJ SOLN
INTRAMUSCULAR | Status: AC
  Filled 2013-08-29: qty 1

## 2013-08-29 MED ORDER — ONDANSETRON HCL 4 MG/2ML IJ SOLN
INTRAMUSCULAR | Status: DC | PRN
  Administered 2013-08-29: 4 mg via INTRAVENOUS

## 2013-08-29 MED ORDER — MIDAZOLAM HCL 2 MG/2ML IJ SOLN
1.0000 mg | INTRAMUSCULAR | Status: DC | PRN
  Administered 2013-08-29 (×2): 2 mg via INTRAVENOUS

## 2013-08-29 MED ORDER — LACTATED RINGERS IV SOLN
INTRAVENOUS | Status: DC
  Administered 2013-08-29 (×3): via INTRAVENOUS

## 2013-08-29 MED ORDER — OXYCODONE-ACETAMINOPHEN 5-325 MG PO TABS
1.0000 | ORAL_TABLET | ORAL | Status: DC | PRN
  Administered 2013-08-29 – 2013-08-30 (×4): 2 via ORAL
  Filled 2013-08-29 (×4): qty 2

## 2013-08-29 MED ORDER — ONDANSETRON HCL 4 MG PO TABS
4.0000 mg | ORAL_TABLET | Freq: Four times a day (QID) | ORAL | Status: DC | PRN
  Administered 2013-08-29: 4 mg via ORAL
  Filled 2013-08-29: qty 1

## 2013-08-29 MED ORDER — HYDROMORPHONE HCL PF 1 MG/ML IJ SOLN
0.5000 mg | INTRAMUSCULAR | Status: DC | PRN
  Administered 2013-08-29 – 2013-08-30 (×5): 1 mg via INTRAVENOUS
  Filled 2013-08-29 (×5): qty 1

## 2013-08-29 SURGICAL SUPPLY — 95 items
BANDAGE ELASTIC 4 VELCRO ST LF (GAUZE/BANDAGES/DRESSINGS) ×4 IMPLANT
BANDAGE ELASTIC 6 VELCRO ST LF (GAUZE/BANDAGES/DRESSINGS) ×4 IMPLANT
BANDAGE ESMARK 6X9 LF (GAUZE/BANDAGES/DRESSINGS) ×4 IMPLANT
BIT DRILL 2.5X125 (BIT) ×8 IMPLANT
BIT DRILL 3.5X125 (BIT) ×2 IMPLANT
BIT DRILL AO GAMMA 4.2X340 (BIT) ×4 IMPLANT
BLADE SURG 15 STRL LF DISP TIS (BLADE) ×4 IMPLANT
BLADE SURG 15 STRL SS (BLADE) ×4
BNDG COHESIVE 4X5 TAN STRL (GAUZE/BANDAGES/DRESSINGS) ×4 IMPLANT
BNDG ESMARK 6X9 LF (GAUZE/BANDAGES/DRESSINGS) ×8
CHLORAPREP W/TINT 26ML (MISCELLANEOUS) ×4 IMPLANT
CLOSURE WOUND 1/2 X4 (GAUZE/BANDAGES/DRESSINGS) ×1
COVER MAYO STAND STRL (DRAPES) ×4 IMPLANT
COVER TABLE BACK 60X90 (DRAPES) ×4 IMPLANT
CUFF TOURNIQUET SINGLE 24IN (TOURNIQUET CUFF) IMPLANT
CUFF TOURNIQUET SINGLE 34IN LL (TOURNIQUET CUFF) ×4 IMPLANT
DECANTER SPIKE VIAL GLASS SM (MISCELLANEOUS) IMPLANT
DRAPE C-ARM 42X72 X-RAY (DRAPES) IMPLANT
DRAPE C-ARMOR (DRAPES) ×4 IMPLANT
DRAPE EXTREMITY T 121X128X90 (DRAPE) ×4 IMPLANT
DRAPE OEC MINIVIEW 54X84 (DRAPES) IMPLANT
DRAPE U 20/CS (DRAPES) ×4 IMPLANT
DRAPE U-SHAPE 47X51 STRL (DRAPES) ×4 IMPLANT
DRAPE UTILITY 15X26 W/TAPE STR (DRAPE) ×4 IMPLANT
DRILL BIT 3.5X125 (BIT) ×2
DRILL, AO ×4 IMPLANT
DRSG EMULSION OIL 3X3 NADH (GAUZE/BANDAGES/DRESSINGS) ×4 IMPLANT
ELECT REM PT RETURN 9FT ADLT (ELECTROSURGICAL) ×4
ELECTRODE REM PT RTRN 9FT ADLT (ELECTROSURGICAL) ×2 IMPLANT
FACESHIELD LNG OPTICON STERILE (SAFETY) IMPLANT
GLOVE BIO SURGEON STRL SZ 6.5 (GLOVE) ×3 IMPLANT
GLOVE BIO SURGEON STRL SZ7.5 (GLOVE) ×4 IMPLANT
GLOVE BIO SURGEONS STRL SZ 6.5 (GLOVE) ×1
GLOVE BIOGEL M STRL SZ7.5 (GLOVE) IMPLANT
GLOVE BIOGEL PI IND STRL 7.0 (GLOVE) ×2 IMPLANT
GLOVE BIOGEL PI IND STRL 8 (GLOVE) ×4 IMPLANT
GLOVE BIOGEL PI INDICATOR 7.0 (GLOVE) ×2
GLOVE BIOGEL PI INDICATOR 8 (GLOVE) ×4
GLOVE INDICATOR 7.5 STRL GRN (GLOVE) ×4 IMPLANT
GLOVE SURG SS PI 7.0 STRL IVOR (GLOVE) ×4 IMPLANT
GLOVE SURG SS PI 7.5 STRL IVOR (GLOVE) ×4 IMPLANT
GOWN BRE IMP PREV XXLGXLNG (GOWN DISPOSABLE) ×4 IMPLANT
GOWN PREVENTION PLUS LG XLONG (DISPOSABLE) ×4 IMPLANT
GOWN STRL REIN XL XLG (GOWN DISPOSABLE) ×4 IMPLANT
GOWN STRL REUS W/ TWL LRG LVL3 (GOWN DISPOSABLE) ×8 IMPLANT
GOWN STRL REUS W/ TWL XL LVL3 (GOWN DISPOSABLE) ×2 IMPLANT
GOWN STRL REUS W/TWL LRG LVL3 (GOWN DISPOSABLE) ×8
GOWN STRL REUS W/TWL XL LVL3 (GOWN DISPOSABLE) ×2
GUIDEWIRE GAMMA (WIRE) ×4 IMPLANT
GUIDEWIRE GAMMA 800 (WIRE) ×4 IMPLANT
NAIL TIBIAL STD 9X345MM (Nail) ×4 IMPLANT
NEEDLE HYPO 22GX1.5 SAFETY (NEEDLE) IMPLANT
NS IRRIG 1000ML POUR BTL (IV SOLUTION) ×4 IMPLANT
PACK BASIN DAY SURGERY FS (CUSTOM PROCEDURE TRAY) ×4 IMPLANT
PAD CAST 4YDX4 CTTN HI CHSV (CAST SUPPLIES) ×2 IMPLANT
PADDING CAST COTTON 4X4 STRL (CAST SUPPLIES) ×2
PADDING CAST COTTON 6X4 STRL (CAST SUPPLIES) ×4 IMPLANT
PENCIL BUTTON HOLSTER BLD 10FT (ELECTRODE) ×4 IMPLANT
PLATE TUBUAL 1/3 6H (Plate) ×4 IMPLANT
POSITIONER SURGICAL ARM (MISCELLANEOUS) ×4 IMPLANT
REAMER SHAFT BIXCUT (INSTRUMENTS) ×4 IMPLANT
SCREW CANC FT 20X4X2.5XHEX (Screw) ×2 IMPLANT
SCREW CANCELLOUS 4.0X20 (Screw) ×2 IMPLANT
SCREW CANCELLOUS FT 4.0X18MM (Screw) ×4 IMPLANT
SCREW CORTEX ST MATTA 3.5X16MM (Screw) ×12 IMPLANT
SCREW CORTEX ST MATTA 3.5X20 (Screw) ×4 IMPLANT
SCREW GAMMA (Screw) ×4 IMPLANT
SCREW LOCKING T2 F/T  5MMX45MM (Screw) ×2 IMPLANT
SCREW LOCKING T2 F/T  5MMX50MM (Screw) ×2 IMPLANT
SCREW LOCKING T2 F/T  5X37.5MM (Screw) ×2 IMPLANT
SCREW LOCKING T2 F/T  5X42.5MM (Screw) ×2 IMPLANT
SCREW LOCKING T2 F/T 5MMX45MM (Screw) ×2 IMPLANT
SCREW LOCKING T2 F/T 5MMX50MM (Screw) ×2 IMPLANT
SCREW LOCKING T2 F/T 5X37.5MM (Screw) ×2 IMPLANT
SCREW LOCKING T2 F/T 5X42.5MM (Screw) ×2 IMPLANT
SLEEVE SCD COMPRESS KNEE MED (MISCELLANEOUS) IMPLANT
SPLINT FAST PLASTER 5X30 (CAST SUPPLIES)
SPLINT PLASTER CAST FAST 5X30 (CAST SUPPLIES) IMPLANT
SPONGE GAUZE 4X4 12PLY (GAUZE/BANDAGES/DRESSINGS) ×4 IMPLANT
SPONGE LAP 4X18 X RAY DECT (DISPOSABLE) ×4 IMPLANT
STRIP CLOSURE SKIN 1/2X4 (GAUZE/BANDAGES/DRESSINGS) ×3 IMPLANT
SUCTION FRAZIER TIP 10 FR DISP (SUCTIONS) ×4 IMPLANT
SUT MON AB 2-0 CT1 36 (SUTURE) ×4 IMPLANT
SUT MON AB 4-0 PC3 18 (SUTURE) ×4 IMPLANT
SUT VIC AB 0 SH 27 (SUTURE) IMPLANT
SUT VIC AB 2-0 SH 27 (SUTURE)
SUT VIC AB 2-0 SH 27XBRD (SUTURE) IMPLANT
SYR 20CC LL (SYRINGE) IMPLANT
SYR BULB 3OZ (MISCELLANEOUS) ×4 IMPLANT
TOWEL OR 17X24 6PK STRL BLUE (TOWEL DISPOSABLE) ×8 IMPLANT
TOWEL OR 17X26 10 PK STRL BLUE (TOWEL DISPOSABLE) ×8 IMPLANT
TOWEL OR NON WOVEN STRL DISP B (DISPOSABLE) ×4 IMPLANT
TUBE CONNECTING 20'X1/4 (TUBING) ×1
TUBE CONNECTING 20X1/4 (TUBING) ×3 IMPLANT
UNDERPAD 30X30 INCONTINENT (UNDERPADS AND DIAPERS) ×4 IMPLANT

## 2013-08-29 NOTE — Transfer of Care (Signed)
Immediate Anesthesia Transfer of Care Note  Patient: Hailey Houston  Procedure(s) Performed: Procedure(s): LEFT FIBULA -FRACTURE OPEN TREATMENT DISTAL FIBULAR (LATERALERAL MALLEOLUS) INCLUDES INTERNAL FIXATION,  (Left) FRACTURE TREATMENT TIBIAL SHAFT BY INTRAMEDULLARY IMPLANT WITH SCREWS  (Left)  Patient Location: PACU  Anesthesia Type:GA combined with regional for post-op pain  Level of Consciousness: awake, sedated and patient cooperative  Airway & Oxygen Therapy: Patient Spontanous Breathing and Patient connected to face mask oxygen  Post-op Assessment: Report given to PACU RN and Post -op Vital signs reviewed and stable  Post vital signs: Reviewed and stable  Complications: No apparent anesthesia complications

## 2013-08-29 NOTE — Anesthesia Preprocedure Evaluation (Addendum)
Anesthesia Evaluation  Patient identified by MRN, date of birth, ID band Patient awake    Reviewed: Allergy & Precautions, H&P , NPO status , Patient's Chart, lab work & pertinent test results  Airway Mallampati: II TM Distance: >3 FB Neck ROM: Full    Dental no notable dental hx. (+) Teeth Intact, Dental Advisory Given   Pulmonary Current Smoker,  breath sounds clear to auscultation  Pulmonary exam normal       Cardiovascular negative cardio ROS  Rhythm:Regular Rate:Normal     Neuro/Psych Anxiety negative neurological ROS     GI/Hepatic negative GI ROS, Neg liver ROS,   Endo/Other  negative endocrine ROS  Renal/GU negative Renal ROS  negative genitourinary   Musculoskeletal   Abdominal   Peds  Hematology negative hematology ROS (+)   Anesthesia Other Findings   Reproductive/Obstetrics negative OB ROS                          Anesthesia Physical Anesthesia Plan  ASA: II  Anesthesia Plan: General and Regional   Post-op Pain Management:    Induction: Intravenous  Airway Management Planned: LMA  Additional Equipment:   Intra-op Plan:   Post-operative Plan: Extubation in OR  Informed Consent: I have reviewed the patients History and Physical, chart, labs and discussed the procedure including the risks, benefits and alternatives for the proposed anesthesia with the patient or authorized representative who has indicated his/her understanding and acceptance.   Dental advisory given  Plan Discussed with: CRNA  Anesthesia Plan Comments:         Anesthesia Quick Evaluation

## 2013-08-29 NOTE — Progress Notes (Signed)
Assisted Dr. Ola Spurr with left, ultrasound guided, popliteal, ankle block. Side rails up, monitors on throughout procedure. See vital signs in flow sheet. Tolerated Procedure well.

## 2013-08-29 NOTE — Anesthesia Postprocedure Evaluation (Signed)
  Anesthesia Post-op Note  Patient: Hailey Houston  Procedure(s) Performed: Procedure(s): LEFT FIBULA -FRACTURE OPEN TREATMENT DISTAL FIBULAR (LATERALERAL MALLEOLUS) INCLUDES INTERNAL FIXATION,  (Left) FRACTURE TREATMENT TIBIAL SHAFT BY INTRAMEDULLARY IMPLANT WITH SCREWS  (Left)  Patient Location: PACU  Anesthesia Type:General and block  Level of Consciousness: awake and alert   Airway and Oxygen Therapy: Patient Spontanous Breathing  Post-op Pain: mild  Post-op Assessment: Post-op Vital signs reviewed, Patient's Cardiovascular Status Stable and Respiratory Function Stable  Post-op Vital Signs: Reviewed  Filed Vitals:   08/29/13 1215  BP: 124/68  Pulse: 102  Temp:   Resp: 14    Complications: No apparent anesthesia complications

## 2013-08-29 NOTE — Anesthesia Procedure Notes (Signed)
Anesthesia Regional Block:  Popliteal block  Pre-Anesthetic Checklist: ,, timeout performed, Correct Patient, Correct Site, Correct Laterality, Correct Procedure, Correct Position, site marked, Risks and benefits discussed, pre-op evaluation, post-op pain management  Laterality: Left  Prep: Maximum Sterile Barrier Precautions used and chloraprep       Needles:  Injection technique: Single-shot  Needle Type: Echogenic Stimulator Needle     Needle Length: 9cm 9 cm Needle Gauge: 21 and 21 G    Additional Needles:  Procedures: ultrasound guided (picture in chart) and nerve stimulator Popliteal block  Nerve Stimulator or Paresthesia:  Response: Peroneal,  Response: Tibial,   Additional Responses:   Narrative:  Start time: 08/29/2013 8:03 AM End time: 08/29/2013 8:19 AM Injection made incrementally with aspirations every 5 mL. Anesthesiologist: Ola Spurr, MD  Additional Notes: 2% Lidocaine skin wheel. Saphenous block with 10cc of 0.5% Bupivicaine plain.

## 2013-08-29 NOTE — Op Note (Signed)
08/29/2013  10:26 AM  PATIENT:  Hailey Houston    PRE-OPERATIVE DIAGNOSIS:  Closed fracture of lateral malleolus   LEFT  Closed fracture of shaft of fibula with tibia LEFT   POST-OPERATIVE DIAGNOSIS:  Same  PROCEDURE:  LEFT FIBULA -FRACTURE OPEN TREATMENT DISTAL FIBULAR (LATERALERAL MALLEOLUS) INCLUDES INTERNAL FIXATION, , FRACTURE TREATMENT TIBIAL SHAFT BY INTRAMEDULLARY IMPLANT WITH SCREWS   SURGEON:  [name], MD  ASSISTANT: Joya Gaskins OPA  ANESTHESIA:   General  PREOPERATIVE INDICATIONS:  Hailey Houston is a  43 y.o. female with a diagnosis of Closed fracture of lateral malleolus   LEFT  Closed fracture of shaft of fibula with tibia LEFT  who failed conservative measures and elected for surgical management.    The risks benefits and alternatives were discussed with the patient preoperatively including but not limited to the risks of infection, bleeding, nerve injury, cardiopulmonary complications, the need for revision surgery, among others, and the patient was willing to proceed.  OPERATIVE IMPLANTS: Stryker T2 Tibial nail 9x345, and a 1/3 tubular plate   BLOOD LOSS: minimal  COMPLICATIONS: none  TOURNIQUET TIME: none  OPERATIVE PROCEDURE:  Patient was identified in the preoperative holding area and site was marked by me He was transported to the operating theater and placed on the table in supine position taking care to pad all bony prominences. After a preincinduction time out anesthesia was induced. The left lower extremity was prepped and draped in normal sterile fashion and a pre-incision timeout was performed. Hailey Houston received ancef for preoperative antibiotics.   Given the distal nature of the fibular fracture as where about ankle stability side elected to fix this independently. I made a 5 cm incision centered over the fracture and dissected sharply down to the bone distally and then used spreading dissection proximally to protect the superficial  peroneal nerve. Once the periosteum I incised this in line with the skin incision and protected the peroneal tendons. I then performed a reduction maneuver with a clamp and placed a front to back lag screw in the fibula. As happy with the reduction and the holder the lag screws I placed a 6 hole one third tubular plate treating it posteriorly to protect from prominence and tibia proximal to the watershed line so as not to irritate the peroneal tendons. I then thoroughly irrigated and close this wound in layers with absorbable stitch.  This significantly helped the reduction of the tibia and it was lined up very well I felt that I can easily get 2 distal interlock screws in the nail side carried on with the plan to nail the tibia.  The leg was placed over the radiolucent triangle. I then made a 4 cm incision over the patella tendon. I dissected down and incised the patella tendon taking care to not penetrate into the joint itself.   I placed a guidewire under fluoroscopic guidance just medial to lateral tibial spine. I was happy with this placement on all views. I used the entry reamer to create a path into the proximal tibia staying out of the joint itself.  I then passed the ball tip guidewire down the tibia and across the fracture site. I held appropriate reduction confirmed on multiple views of fluoroscopy and sequentially reamed up to an appropriate size with appropriate chatter. I selected the above-sized nail and passed it down the ball-tipped guidewire and seated it completely to a was sunk into the proximal tibia.  I then used a perfect  circles technique to place one transverse distal interlock screw given the distraction from sinking the nail as low as we could. Next I back slapped on the outrigger compressing the fracture back together.  By this I confirmed the rotation was appropriate.  I then used the outrigger placed 2 oblique proximal locking screws. As happy with her length and  placement on multiple oblique fluoroscopic views.  The wounds were then all thoroughly irrigated and closed in layers. Sterile dressing was applied and he was taken to the PACU in stable condition.  POST OPERATIVE PLAN: NWB, DVT prophylaxis: Early ambulation, SCD's, ASA 81mg  daily

## 2013-08-29 NOTE — Interval H&P Note (Signed)
History and Physical Interval Note:  08/29/2013 7:22 AM  Hailey Houston  has presented today for surgery, with the diagnosis of Closed fracture of lateral malleolus   LEFT  Closed fracture of shaft of fibula with tibia LEFT   The various methods of treatment have been discussed with the patient and family. After consideration of risks, benefits and other options for treatment, the patient has consented to  Procedure(s): LEFT FIBULA -FRACTURE OPEN TREATMENT DISTAL FIBULAR (LATERALERAL MALLEOLUS) INCLUDES INTERNAL FIXATION,  (Left) FRACTURE TREATMENT TIBIAL SHAFT BY INTRAMEDULLARY IMPLANT WITH SCREWS  (Left) as a surgical intervention .  The patient's history has been reviewed, patient examined, no change in status, stable for surgery.  I have reviewed the patient's chart and labs.  Questions were answered to the patient's satisfaction.     Hailey Houston

## 2013-08-30 NOTE — Discharge Instructions (Signed)
No weight on leg.  Take Aspirin 81mg  daily for 30 days                                                                                                                                                                              Ankle Arthrodesis Care After Refer to this sheet during the next few weeks. These discharge instructions provide you with general information on caring for yourself after you leave the hospital or surgery center. Your caregiver may also give you additional specific instructions. Your treatment has been planned according to the most current medical practices available, but unavoidable complications sometimes occur. If you have any problems or questions after discharge, call your caregiver.  HOME CARE INSTRUCTIONS   Keep your foot raised (elevated) above the level of your heart as much as possible. This will decrease swelling.  Use 2 pillows under you knee and ankle to keep your leg elevated while you are sleeping.  Only put weight on your ankle as directed by your caregiver.  Non-weight bearing.  Bathe and shower with leg covered with plastic bag with tape or rubber band at top to keep dressing from getting wet.  If it does get damp, dry with hair dryer set on cool.  If it gets very wet, call dr's office to get dressing change to prevent post-operative infection.  Take all medicines as directed by your caregiver.  Do not use over-the-counter medicines, vitamins, or herbal supplements without your caregiver's approval.  Do not drive while you are using narcotic pain medicines or medicines for when you feel sick to your stomach (nauseous).  Follow the diet prescribed by your caregiver.  You may be asked to weigh yourself or measure your blood pressure daily. Your caregiver will tell you what results should be reported right away.  Follow your caregiver's recommendations for the activities you can do, the amount of weight you can lift, and when you can return to work.  These recommendations will depend on the type of surgery you had and your general health.  Your caregiver will explain the correct way to care for your splint and incision(s). Follow these recommendations carefully.  Write down questions to ask at your next appointment.  SEEK MEDICAL CARE IF:   Your pain level increases and does not improve after you have taken the pain medicines you have been given.  You are nauseous or you throw up (vomit).  You cannot have a bowel movement (are constipated) or you have diarrhea.  You develop a rash.  You have a burning sensation when you urinate or you need to urinate more often than usual.  You develop a new cough or difficulty breathing.   SEEK  IMMEDIATE MEDICAL CARE IF:   You have an oral temperature above 102 F (38.9 C), not controlled by medicine.  You have chest pain.  You have chills.  You have shortness of breath.  Your bandages become soaked with blood.  Your incision(s) becomes red, opens up, or develops a creamy or bad smelling discharge.  You have a severe increase in pain.  Your foot becomes very cold, blue, numb, or tingly.  You have pain, tenderness, or redness in your calf.  Bending or stretching your toes produces severe leg pain.

## 2013-09-04 ENCOUNTER — Encounter (HOSPITAL_BASED_OUTPATIENT_CLINIC_OR_DEPARTMENT_OTHER): Payer: Self-pay | Admitting: Orthopedic Surgery

## 2018-04-04 ENCOUNTER — Other Ambulatory Visit: Payer: Self-pay | Admitting: Psychiatry

## 2018-04-04 DIAGNOSIS — F900 Attention-deficit hyperactivity disorder, predominantly inattentive type: Secondary | ICD-10-CM

## 2018-04-04 MED ORDER — AMPHETAMINE-DEXTROAMPHETAMINE 30 MG PO TABS: 30.0000 mg | ORAL_TABLET | Freq: Two times a day (BID) | ORAL | 0 refills | Status: DC

## 2018-04-04 NOTE — Progress Notes (Signed)
adderall rf 3 mos

## 2018-05-27 DIAGNOSIS — F909 Attention-deficit hyperactivity disorder, unspecified type: Secondary | ICD-10-CM

## 2018-06-01 ENCOUNTER — Other Ambulatory Visit: Payer: Self-pay | Admitting: Family

## 2018-06-01 DIAGNOSIS — R748 Abnormal levels of other serum enzymes: Secondary | ICD-10-CM

## 2018-06-01 DIAGNOSIS — R1084 Generalized abdominal pain: Secondary | ICD-10-CM

## 2018-06-06 ENCOUNTER — Encounter: Payer: Self-pay | Admitting: Physician Assistant

## 2018-06-06 ENCOUNTER — Ambulatory Visit (INDEPENDENT_AMBULATORY_CARE_PROVIDER_SITE_OTHER): Payer: 59 | Admitting: Physician Assistant

## 2018-06-06 ENCOUNTER — Other Ambulatory Visit (INDEPENDENT_AMBULATORY_CARE_PROVIDER_SITE_OTHER): Payer: 59

## 2018-06-06 ENCOUNTER — Ambulatory Visit: Payer: Self-pay | Admitting: Gastroenterology

## 2018-06-06 VITALS — BP 124/80 | HR 78 | Ht 71.0 in | Wt 237.1 lb

## 2018-06-06 DIAGNOSIS — R945 Abnormal results of liver function studies: Secondary | ICD-10-CM

## 2018-06-06 DIAGNOSIS — R110 Nausea: Secondary | ICD-10-CM | POA: Diagnosis not present

## 2018-06-06 DIAGNOSIS — R9389 Abnormal findings on diagnostic imaging of other specified body structures: Secondary | ICD-10-CM

## 2018-06-06 DIAGNOSIS — R7989 Other specified abnormal findings of blood chemistry: Secondary | ICD-10-CM

## 2018-06-06 DIAGNOSIS — R1013 Epigastric pain: Secondary | ICD-10-CM

## 2018-06-06 DIAGNOSIS — R17 Unspecified jaundice: Secondary | ICD-10-CM

## 2018-06-06 DIAGNOSIS — K8689 Other specified diseases of pancreas: Secondary | ICD-10-CM

## 2018-06-06 DIAGNOSIS — K838 Other specified diseases of biliary tract: Secondary | ICD-10-CM

## 2018-06-06 LAB — CBC WITH DIFFERENTIAL/PLATELET
BASOS PCT: 0.4 % (ref 0.0–3.0)
Basophils Absolute: 0 10*3/uL (ref 0.0–0.1)
Eosinophils Absolute: 0.1 10*3/uL (ref 0.0–0.7)
Eosinophils Relative: 2.5 % (ref 0.0–5.0)
HEMATOCRIT: 36.3 % (ref 36.0–46.0)
Hemoglobin: 12.1 g/dL (ref 12.0–15.0)
LYMPHS ABS: 1.5 10*3/uL (ref 0.7–4.0)
LYMPHS PCT: 30.2 % (ref 12.0–46.0)
MCHC: 33.3 g/dL (ref 30.0–36.0)
MCV: 95 fl (ref 78.0–100.0)
Monocytes Absolute: 0.5 10*3/uL (ref 0.1–1.0)
Monocytes Relative: 10.6 % (ref 3.0–12.0)
NEUTROS ABS: 2.9 10*3/uL (ref 1.4–7.7)
NEUTROS PCT: 56.3 % (ref 43.0–77.0)
PLATELETS: 415 10*3/uL — AB (ref 150.0–400.0)
RBC: 3.83 Mil/uL — ABNORMAL LOW (ref 3.87–5.11)
RDW: 14.1 % (ref 11.5–15.5)
WBC: 5.1 10*3/uL (ref 4.0–10.5)

## 2018-06-06 LAB — PROTIME-INR
INR: 1 ratio (ref 0.8–1.0)
Prothrombin Time: 12.2 s (ref 9.6–13.1)

## 2018-06-06 MED ORDER — HYDROXYZINE HCL 25 MG PO TABS: ORAL_TABLET | ORAL | 2 refills | Status: DC

## 2018-06-06 MED ORDER — NYSTATIN 100000 UNIT/ML MT SUSP: 5.0000 mL | Freq: Four times a day (QID) | OROMUCOSAL | 1 refills | Status: DC

## 2018-06-06 MED ORDER — KETOROLAC TROMETHAMINE 10 MG PO TABS: ORAL_TABLET | ORAL | 1 refills | Status: DC

## 2018-06-06 MED ORDER — ONDANSETRON HCL 4 MG PO TABS: ORAL_TABLET | ORAL | 2 refills | Status: DC

## 2018-06-06 NOTE — Patient Instructions (Addendum)
Please go to the basement level to have your labs drawn.   We sent prescriptions to [facility]  1. Zofran 4 mg  2. Toradol 10 mg 3. Atarax 25 mg.   You have been scheduled for an ERCP Please follow written instructions given to you at your visit today. If you use inhalers (even only as needed), please bring them with you on the day of your procedure.   Normal BMI (Body Mass Index- based on height and weight) is between 19 and 25. Your BMI today is Body mass index is 33.07 kg/m. Marland Kitchen Please consider follow up  regarding your BMI with your Primary Care Provider.Marland Kitchen

## 2018-06-06 NOTE — Progress Notes (Signed)
I agree with the above note, plan 

## 2018-06-06 NOTE — H&P (View-Only) (Signed)
Subjective:    Patient ID: Hailey Houston, female    DOB: 03-02-1971, 48 y.o.   MRN: [id number]  HPI Hailey Houston is a pleasant 48 year old white female, new to GI today, referred by Alicia Amel, NP/[facility] at patient's employer. She has not had any prior GI issues. She says she started having problems around Christmas, noting that she just felt poorly in general.  She then began feeling queasy and nauseated the point that she did not feel like eating.  She has vomited but only occasionally with gagging with toothbrushing.  She has had some progressive difficulty eating and has started noticing that she generally feels worse with upper abdominal discomfort and queasiness after eating.  She has not had any fever or chills, no changes in her bowel habits melena or hematochezia.  She has lost about 6 pounds.  She is also noticed that her urine has become darker and is now tea colored over the past week or so despite pushing hydration. She was seen at employee health, had labs done on 06/05/2018 with BUN of 9/creatinine 0.67.  Total bili 2.1/alk phos 382/AST 276/ALT 439, Lipase 580 CT scan of the abdomen and pelvis was then done here in [place] at [facility] yesterday. CT scan shows focal fatty infiltration of the liver, a distended gallbladder containing dependent gallstones.  There is intra-and extrahepatic ductal dilation with common bile duct measuring 1.5 cm.  Also with mild dilation of the pancreatic duct and a heterogeneous masslike enlargement of the pancreatic head with a small amount of surrounding hazy stranding There is porta hepatic, gastrohepatic and retroperitoneal lymphadenopathy present, and pulmonary nodules in the left lower lobe concerning for metastatic disease. CT results had not been discussed with the patient prior to this office visit.  Review of Systems Pertinent positive and negative review of systems were noted in the above  HPI section.  All other review of systems was otherwise negative.  Outpatient Encounter Medications as of 06/06/2018  Medication Sig  . amphetamine-dextroamphetamine (ADDERALL XR) 30 MG 24 hr capsule Take 30 mg by mouth every morning. Take 30m xr in the mornings, and regular 324min the evening twice a day  . cetirizine (ZYRTEC) 10 MG tablet Take 10 mg by mouth daily as needed for allergies.  . hydrOXYzine (ATARAX/VISTARIL) 25 MG tablet Take 1-2 tablets by mouth every 6 hours as needed for itching.  . Marland Kitchenetorolac (TORADOL) 10 MG tablet Take 1 tablet by mouth every 6-8 hours as needed.  . Marland Kitchenevonorgestrel (MIRENA) 20 MCG/24HR IUD 1 each by Intrauterine route once.  . nystatin-triamcinolone ointment (MYCOLOG) Apply 1 application topically 2 (two) times daily.  . ondansetron (ZOFRAN) 4 MG tablet Take 1 tablet every 6 hours as needed for nausea.  . [DISCONTINUED] hydrOXYzine (ATARAX/VISTARIL) 25 MG tablet Take 25 mg by mouth 3 (three) times daily as needed.  . [DISCONTINUED] ketorolac (TORADOL) 10 MG tablet Take 10 mg by mouth every 6 (six) hours as needed.  . [DISCONTINUED] ondansetron (ZOFRAN) 4 MG tablet Take 1 tablet (4 mg total) by mouth every 8 (eight) hours as needed for nausea.  . [DISCONTINUED] ondansetron (ZOFRAN) 4 MG tablet Take 1 tablet every 6 hours as needed for nausea.  . Marland Kitchenystatin (MYCOSTATIN) 100000 UNIT/ML suspension Take 5 mLs (500,000 Units total) by mouth 4 (four) times daily.  . [DISCONTINUED] ALPRAZolam (XANAX) 0.5 MG tablet Take 0.5 mg by mouth 2 (two) times daily as needed for anxiety.  . [DISCONTINUED] amphetamine-dextroamphetamine (ADDERALL) 30  MG tablet Take 1 tablet by mouth 2 (two) times daily. (Patient not taking: Reported on 06/06/2018)  . [DISCONTINUED] amphetamine-dextroamphetamine (ADDERALL) 30 MG tablet Take 1 tablet by mouth 2 (two) times daily. (Patient not taking: Reported on 06/06/2018)  . [DISCONTINUED] amphetamine-dextroamphetamine (ADDERALL) 30 MG tablet Take 1  tablet by mouth 2 (two) times daily. (Patient not taking: Reported on 06/06/2018)  . [DISCONTINUED] aspirin 81 MG tablet Take 1 tablet (81 mg total) by mouth daily. (Patient not taking: Reported on 06/06/2018)  . [DISCONTINUED] cyclobenzaprine (FLEXERIL) 10 MG tablet Take 10 mg by mouth 3 (three) times daily as needed for muscle spasms.  . [DISCONTINUED] diazepam (VALIUM) 2 MG tablet Take 2 mg by mouth every 6 (six) hours as needed for anxiety.  . [DISCONTINUED] diphenhydrAMINE (BENADRYL) 25 MG tablet Take 25 mg by mouth every 6 (six) hours as needed.  . [DISCONTINUED] docusate sodium (COLACE) 100 MG capsule Take 1 capsule (100 mg total) by mouth 2 (two) times daily. Continue this while taking narcotics to help with bowel movements (Patient not taking: Reported on 06/06/2018)  . [DISCONTINUED] HYDROcodone-acetaminophen (NORCO) 5-325 MG per tablet Take 2 tablets by mouth every 4 (four) hours as needed. (Patient not taking: Reported on 06/06/2018)  . [DISCONTINUED] HYDROmorphone (DILAUDID) 2 MG tablet Take by mouth every 4 (four) hours as needed for severe pain.  . [DISCONTINUED] oxyCODONE-acetaminophen (PERCOCET/ROXICET) 5-325 MG per tablet Take by mouth every 4 (four) hours as needed for severe pain.  . [DISCONTINUED] promethazine (PHENERGAN) 25 MG tablet Take 25 mg by mouth every 6 (six) hours as needed for nausea or vomiting.  . [DISCONTINUED] venlafaxine (EFFEXOR) 75 MG tablet Take 300 mg by mouth daily.   . [DISCONTINUED] zaleplon (SONATA) 10 MG capsule Take 10 mg by mouth at bedtime as needed for sleep.   No facility-administered encounter medications on file as of 06/06/2018.    Allergies  Allergen Reactions  . Oxycodone Itching   Patient Active Problem List   Diagnosis Date Noted  . Attention deficit hyperactivity disorder (ADHD) 05/27/2018  . Tibia fracture 08/29/2013   Social History   Socioeconomic History  . Marital status: Single    Spouse name: Not on file  . Number of children:  Not on file  . Years of education: Not on file  . Highest education level: Not on file  Occupational History  . Not on file  Social Needs  . Financial resource strain: Not on file  . Food insecurity:    Worry: Not on file    Inability: Not on file  . Transportation needs:    Medical: Not on file    Non-medical: Not on file  Tobacco Use  . Smoking status: Current Some Day Smoker  . Smokeless tobacco: Never Used  Substance and Sexual Activity  . Alcohol use: Yes    Comment: occ  . Drug use: No  . Sexual activity: Not Currently    Comment: a pk/week  Lifestyle  . Physical activity:    Days per week: Not on file    Minutes per session: Not on file  . Stress: Not on file  Relationships  . Social connections:    Talks on phone: Not on file    Gets together: Not on file    Attends religious service: Not on file    Active member of club or organization: Not on file    Attends meetings of clubs or organizations: Not on file    Relationship status: Not on file  .  Intimate partner violence:    Fear of current or ex partner: Not on file    Emotionally abused: Not on file    Physically abused: Not on file    Forced sexual activity: Not on file  Other Topics Concern  . Not on file  Social History Narrative  . Not on file    Ms. Achor's family history includes Heart disease in her father and mother.      Objective:    Vitals:   06/06/18 1332  BP: 124/80  Pulse: 78  SpO2: 98%    Physical Exam; well-developed white female in no acute distress, very pleasant, height, 5 foot 11, weight 237, BMI 33.  HEENT; nontraumatic normocephalic EOMI PERRLA sclera with early icterus, oral mucosa benign, she does have a whitish coloration to the tongue but no definite thrush.  Cardiovascular; regular rate and rhythm with S1-S2 no murmur rub or gallop, Pulmonary ;clear bilaterally, Abdomen; soft, bowel sounds are present, there is no palpable mass or hepatosplenomegaly she has tenderness in  the epigastrium, and also has a macular rash along the upper abdomen, and a smaller patch and line in the lower abdomen.  There are some excoriations from itching.  Rectal ;exam not done, Extremities; no clubbing cyanosis or edema skin warm and dry, Neuro psych; alert and oriented, grossly nonfocal mood and affect appropriate       Assessment & Plan:   #49 48 year old white female with 1 month history of queasiness,and nausea, decreased appetite and vague abdominal discomfort worse after eating.  She has had a weight loss of about 6 pounds since onset and noted onset of tea colored urine within the past week. LFTs are elevated with T bili of 2.1/alk phos above 300. CT scan imaging done yesterday shows a pancreatic head mass, and intra-and extrahepatic biliary dilation with CBD of 1.5 cm, and a distended gallbladder with gallstones. In addition there is upper abdominal adenopathy involving the porta hepatis gastrohepatic area and retroperitoneum in addition to pulmonary nodules in the left lower lobe concerning for metastatic disease.  Imaging is consistent with a pancreatic malignancy with biliary obstruction, and possible metastatic disease.  #2 history of ADD  Plan; I had a long discussion with the patient regarding CT scan findings, and correlation with her symptoms including early jaundice consistent with impending biliary obstruction. I informed her that this appeared to be a malignancy of the pancreas which is causing partial obstruction of the bile duct.  Patient needs to have further diagnostic evaluation with upper endoscopy/ERCP with possible stent, and EUS/biopsy.  These procedures were discussed in detail with the patient including indications risks and benefits and she is agreeable to proceed.  Procedures have been scheduled with Dr. Ardis Hughs at [facility] on January 30 at 11:30 AM.  We will check CBC with differential, pro time/INR, CEA and CA-19-9 today We will plan to  repeat CBC and C met on January 30 prior to procedure.  Patient was assured that we would work to get her a definite diagnosis as soon as possible, help guide her through this process and and refer her to other specialists-i.e. oncology and/or surgery as appropriate for help with definitive management.    Genia Harold PA-C 06/06/2018   Cc: Alicia Amel NP

## 2018-06-06 NOTE — Progress Notes (Signed)
Subjective:    Patient ID: Hailey Houston, female    DOB: 03-02-1971, 48 y.o.   MRN: [id number]  HPI Hailey Houston is a pleasant 48 year old white female, new to GI today, referred by Alicia Amel, NP/[facility] at patient's employer. She has not had any prior GI issues. She says she started having problems around Christmas, noting that she just felt poorly in general.  She then began feeling queasy and nauseated the point that she did not feel like eating.  She has vomited but only occasionally with gagging with toothbrushing.  She has had some progressive difficulty eating and has started noticing that she generally feels worse with upper abdominal discomfort and queasiness after eating.  She has not had any fever or chills, no changes in her bowel habits melena or hematochezia.  She has lost about 6 pounds.  She is also noticed that her urine has become darker and is now tea colored over the past week or so despite pushing hydration. She was seen at employee health, had labs done on 06/05/2018 with BUN of 9/creatinine 0.67.  Total bili 2.1/alk phos 382/AST 276/ALT 439, Lipase 580 CT scan of the abdomen and pelvis was then done here in [place] at [facility] yesterday. CT scan shows focal fatty infiltration of the liver, a distended gallbladder containing dependent gallstones.  There is intra-and extrahepatic ductal dilation with common bile duct measuring 1.5 cm.  Also with mild dilation of the pancreatic duct and a heterogeneous masslike enlargement of the pancreatic head with a small amount of surrounding hazy stranding There is porta hepatic, gastrohepatic and retroperitoneal lymphadenopathy present, and pulmonary nodules in the left lower lobe concerning for metastatic disease. CT results had not been discussed with the patient prior to this office visit.  Review of Systems Pertinent positive and negative review of systems were noted in the above  HPI section.  All other review of systems was otherwise negative.  Outpatient Encounter Medications as of 06/06/2018  Medication Sig  . amphetamine-dextroamphetamine (ADDERALL XR) 30 MG 24 hr capsule Take 30 mg by mouth every morning. Take 30m xr in the mornings, and regular 324min the evening twice a day  . cetirizine (ZYRTEC) 10 MG tablet Take 10 mg by mouth daily as needed for allergies.  . hydrOXYzine (ATARAX/VISTARIL) 25 MG tablet Take 1-2 tablets by mouth every 6 hours as needed for itching.  . Marland Kitchenetorolac (TORADOL) 10 MG tablet Take 1 tablet by mouth every 6-8 hours as needed.  . Marland Kitchenevonorgestrel (MIRENA) 20 MCG/24HR IUD 1 each by Intrauterine route once.  . nystatin-triamcinolone ointment (MYCOLOG) Apply 1 application topically 2 (two) times daily.  . ondansetron (ZOFRAN) 4 MG tablet Take 1 tablet every 6 hours as needed for nausea.  . [DISCONTINUED] hydrOXYzine (ATARAX/VISTARIL) 25 MG tablet Take 25 mg by mouth 3 (three) times daily as needed.  . [DISCONTINUED] ketorolac (TORADOL) 10 MG tablet Take 10 mg by mouth every 6 (six) hours as needed.  . [DISCONTINUED] ondansetron (ZOFRAN) 4 MG tablet Take 1 tablet (4 mg total) by mouth every 8 (eight) hours as needed for nausea.  . [DISCONTINUED] ondansetron (ZOFRAN) 4 MG tablet Take 1 tablet every 6 hours as needed for nausea.  . Marland Kitchenystatin (MYCOSTATIN) 100000 UNIT/ML suspension Take 5 mLs (500,000 Units total) by mouth 4 (four) times daily.  . [DISCONTINUED] ALPRAZolam (XANAX) 0.5 MG tablet Take 0.5 mg by mouth 2 (two) times daily as needed for anxiety.  . [DISCONTINUED] amphetamine-dextroamphetamine (ADDERALL) 30  MG tablet Take 1 tablet by mouth 2 (two) times daily. (Patient not taking: Reported on 06/06/2018)  . [DISCONTINUED] amphetamine-dextroamphetamine (ADDERALL) 30 MG tablet Take 1 tablet by mouth 2 (two) times daily. (Patient not taking: Reported on 06/06/2018)  . [DISCONTINUED] amphetamine-dextroamphetamine (ADDERALL) 30 MG tablet Take 1  tablet by mouth 2 (two) times daily. (Patient not taking: Reported on 06/06/2018)  . [DISCONTINUED] aspirin 81 MG tablet Take 1 tablet (81 mg total) by mouth daily. (Patient not taking: Reported on 06/06/2018)  . [DISCONTINUED] cyclobenzaprine (FLEXERIL) 10 MG tablet Take 10 mg by mouth 3 (three) times daily as needed for muscle spasms.  . [DISCONTINUED] diazepam (VALIUM) 2 MG tablet Take 2 mg by mouth every 6 (six) hours as needed for anxiety.  . [DISCONTINUED] diphenhydrAMINE (BENADRYL) 25 MG tablet Take 25 mg by mouth every 6 (six) hours as needed.  . [DISCONTINUED] docusate sodium (COLACE) 100 MG capsule Take 1 capsule (100 mg total) by mouth 2 (two) times daily. Continue this while taking narcotics to help with bowel movements (Patient not taking: Reported on 06/06/2018)  . [DISCONTINUED] HYDROcodone-acetaminophen (NORCO) 5-325 MG per tablet Take 2 tablets by mouth every 4 (four) hours as needed. (Patient not taking: Reported on 06/06/2018)  . [DISCONTINUED] HYDROmorphone (DILAUDID) 2 MG tablet Take by mouth every 4 (four) hours as needed for severe pain.  . [DISCONTINUED] oxyCODONE-acetaminophen (PERCOCET/ROXICET) 5-325 MG per tablet Take by mouth every 4 (four) hours as needed for severe pain.  . [DISCONTINUED] promethazine (PHENERGAN) 25 MG tablet Take 25 mg by mouth every 6 (six) hours as needed for nausea or vomiting.  . [DISCONTINUED] venlafaxine (EFFEXOR) 75 MG tablet Take 300 mg by mouth daily.   . [DISCONTINUED] zaleplon (SONATA) 10 MG capsule Take 10 mg by mouth at bedtime as needed for sleep.   No facility-administered encounter medications on file as of 06/06/2018.    Allergies  Allergen Reactions  . Oxycodone Itching   Patient Active Problem List   Diagnosis Date Noted  . Attention deficit hyperactivity disorder (ADHD) 05/27/2018  . Tibia fracture 08/29/2013   Social History   Socioeconomic History  . Marital status: Single    Spouse name: Not on file  . Number of children:  Not on file  . Years of education: Not on file  . Highest education level: Not on file  Occupational History  . Not on file  Social Needs  . Financial resource strain: Not on file  . Food insecurity:    Worry: Not on file    Inability: Not on file  . Transportation needs:    Medical: Not on file    Non-medical: Not on file  Tobacco Use  . Smoking status: Current Some Day Smoker  . Smokeless tobacco: Never Used  Substance and Sexual Activity  . Alcohol use: Yes    Comment: occ  . Drug use: No  . Sexual activity: Not Currently    Comment: a pk/week  Lifestyle  . Physical activity:    Days per week: Not on file    Minutes per session: Not on file  . Stress: Not on file  Relationships  . Social connections:    Talks on phone: Not on file    Gets together: Not on file    Attends religious service: Not on file    Active member of club or organization: Not on file    Attends meetings of clubs or organizations: Not on file    Relationship status: Not on file  .  Intimate partner violence:    Fear of current or ex partner: Not on file    Emotionally abused: Not on file    Physically abused: Not on file    Forced sexual activity: Not on file  Other Topics Concern  . Not on file  Social History Narrative  . Not on file    Ms. Achor's family history includes Heart disease in her father and mother.      Objective:    Vitals:   06/06/18 1332  BP: 124/80  Pulse: 78  SpO2: 98%    Physical Exam; well-developed white female in no acute distress, very pleasant, height, 5 foot 11, weight 237, BMI 33.  HEENT; nontraumatic normocephalic EOMI PERRLA sclera with early icterus, oral mucosa benign, she does have a whitish coloration to the tongue but no definite thrush.  Cardiovascular; regular rate and rhythm with S1-S2 no murmur rub or gallop, Pulmonary ;clear bilaterally, Abdomen; soft, bowel sounds are present, there is no palpable mass or hepatosplenomegaly she has tenderness in  the epigastrium, and also has a macular rash along the upper abdomen, and a smaller patch and line in the lower abdomen.  There are some excoriations from itching.  Rectal ;exam not done, Extremities; no clubbing cyanosis or edema skin warm and dry, Neuro psych; alert and oriented, grossly nonfocal mood and affect appropriate       Assessment & Plan:   #49 48 year old white female with 1 month history of queasiness,and nausea, decreased appetite and vague abdominal discomfort worse after eating.  She has had a weight loss of about 6 pounds since onset and noted onset of tea colored urine within the past week. LFTs are elevated with T bili of 2.1/alk phos above 300. CT scan imaging done yesterday shows a pancreatic head mass, and intra-and extrahepatic biliary dilation with CBD of 1.5 cm, and a distended gallbladder with gallstones. In addition there is upper abdominal adenopathy involving the porta hepatis gastrohepatic area and retroperitoneum in addition to pulmonary nodules in the left lower lobe concerning for metastatic disease.  Imaging is consistent with a pancreatic malignancy with biliary obstruction, and possible metastatic disease.  #2 history of ADD  Plan; I had a long discussion with the patient regarding CT scan findings, and correlation with her symptoms including early jaundice consistent with impending biliary obstruction. I informed her that this appeared to be a malignancy of the pancreas which is causing partial obstruction of the bile duct.  Patient needs to have further diagnostic evaluation with upper endoscopy/ERCP with possible stent, and EUS/biopsy.  These procedures were discussed in detail with the patient including indications risks and benefits and she is agreeable to proceed.  Procedures have been scheduled with Dr. Ardis Hughs at [facility] on January 30 at 11:30 AM.  We will check CBC with differential, pro time/INR, CEA and CA-19-9 today We will plan to  repeat CBC and C met on January 30 prior to procedure.  Patient was assured that we would work to get her a definite diagnosis as soon as possible, help guide her through this process and and refer her to other specialists-i.e. oncology and/or surgery as appropriate for help with definitive management.   Amarii  Genia Harold PA-C 06/06/2018   Cc: Alicia Amel NP

## 2018-06-07 ENCOUNTER — Telehealth: Payer: Self-pay | Admitting: Gastroenterology

## 2018-06-07 ENCOUNTER — Telehealth: Payer: Self-pay | Admitting: Physician Assistant

## 2018-06-07 LAB — CANCER ANTIGEN 19-9: CA 19 9: 30 U/mL (ref <34)

## 2018-06-07 LAB — CEA: CEA: 7.9 ng/mL — ABNORMAL HIGH

## 2018-06-07 NOTE — Telephone Encounter (Signed)
Correction DOS * 06-06-2018

## 2018-06-07 NOTE — Telephone Encounter (Signed)
Pt pcp offc Dub Mikes called and wanted to know if visit notes can be faxed to [phone] for the [facility] 06/14/2018

## 2018-06-07 NOTE — Telephone Encounter (Signed)
This needs to go to [place] please route accordingly.  Thank you

## 2018-06-07 NOTE — Telephone Encounter (Signed)
Pt pcp offc Dub Mikes called and wanted to know if visit notes can be faxed to [phone] for the [facility] 06/14/2018

## 2018-06-08 NOTE — Telephone Encounter (Signed)
Faxed Belinda Block PA's office  Note from 06-07-18 to [phone] as requested.

## 2018-06-14 ENCOUNTER — Encounter (HOSPITAL_COMMUNITY): Payer: Self-pay | Admitting: *Deleted

## 2018-06-14 ENCOUNTER — Ambulatory Visit (HOSPITAL_COMMUNITY): Payer: 59 | Admitting: Anesthesiology

## 2018-06-14 ENCOUNTER — Other Ambulatory Visit: Payer: Self-pay

## 2018-06-14 ENCOUNTER — Encounter (HOSPITAL_COMMUNITY)
Admission: RE | Disposition: A | Discharge status: Home / Self Care | Payer: Self-pay | Source: Home / Self Care | Attending: Gastroenterology

## 2018-06-14 ENCOUNTER — Ambulatory Visit (HOSPITAL_BASED_OUTPATIENT_CLINIC_OR_DEPARTMENT_OTHER)
Admission: RE | Admit: 2018-06-14 | Discharge: 2018-06-14 | Disposition: A | Discharge status: Home / Self Care | Payer: 59 | Source: Home / Self Care | Attending: Gastroenterology | Admitting: Gastroenterology

## 2018-06-14 ENCOUNTER — Ambulatory Visit (HOSPITAL_COMMUNITY): Payer: 59

## 2018-06-14 DIAGNOSIS — K838 Other specified diseases of biliary tract: Secondary | ICD-10-CM

## 2018-06-14 DIAGNOSIS — R590 Localized enlarged lymph nodes: Secondary | ICD-10-CM | POA: Insufficient documentation

## 2018-06-14 DIAGNOSIS — K831 Obstruction of bile duct: Secondary | ICD-10-CM | POA: Insufficient documentation

## 2018-06-14 DIAGNOSIS — R17 Unspecified jaundice: Secondary | ICD-10-CM

## 2018-06-14 DIAGNOSIS — F909 Attention-deficit hyperactivity disorder, unspecified type: Secondary | ICD-10-CM

## 2018-06-14 DIAGNOSIS — Z8249 Family history of ischemic heart disease and other diseases of the circulatory system: Secondary | ICD-10-CM

## 2018-06-14 DIAGNOSIS — R1013 Epigastric pain: Secondary | ICD-10-CM

## 2018-06-14 DIAGNOSIS — F419 Anxiety disorder, unspecified: Secondary | ICD-10-CM

## 2018-06-14 DIAGNOSIS — Z79899 Other long term (current) drug therapy: Secondary | ICD-10-CM | POA: Insufficient documentation

## 2018-06-14 DIAGNOSIS — F17200 Nicotine dependence, unspecified, uncomplicated: Secondary | ICD-10-CM

## 2018-06-14 DIAGNOSIS — C170 Malignant neoplasm of duodenum: Secondary | ICD-10-CM

## 2018-06-14 DIAGNOSIS — R9389 Abnormal findings on diagnostic imaging of other specified body structures: Secondary | ICD-10-CM

## 2018-06-14 DIAGNOSIS — K3189 Other diseases of stomach and duodenum: Secondary | ICD-10-CM | POA: Insufficient documentation

## 2018-06-14 DIAGNOSIS — R110 Nausea: Secondary | ICD-10-CM

## 2018-06-14 DIAGNOSIS — R945 Abnormal results of liver function studies: Principal | ICD-10-CM

## 2018-06-14 DIAGNOSIS — K858 Other acute pancreatitis without necrosis or infection: Secondary | ICD-10-CM | POA: Diagnosis not present

## 2018-06-14 DIAGNOSIS — K8689 Other specified diseases of pancreas: Secondary | ICD-10-CM

## 2018-06-14 DIAGNOSIS — R7989 Other specified abnormal findings of blood chemistry: Secondary | ICD-10-CM

## 2018-06-14 HISTORY — PX: ESOPHAGOGASTRODUODENOSCOPY: SHX5428

## 2018-06-14 HISTORY — PX: BILIARY STENT PLACEMENT: SHX5538

## 2018-06-14 HISTORY — DX: Family history of other specified conditions: Z84.89

## 2018-06-14 HISTORY — PX: EUS: SHX5427

## 2018-06-14 HISTORY — PX: SPHINCTEROTOMY: SHX5544

## 2018-06-14 HISTORY — PX: ENDOSCOPIC RETROGRADE CHOLANGIOPANCREATOGRAPHY (ERCP) WITH PROPOFOL: SHX5810

## 2018-06-14 HISTORY — PX: BIOPSY: SHX5522

## 2018-06-14 LAB — COMPREHENSIVE METABOLIC PANEL
ALT: 439 U/L — ABNORMAL HIGH (ref 0–44)
AST: 282 U/L — ABNORMAL HIGH (ref 15–41)
Albumin: 4 g/dL (ref 3.5–5.0)
Alkaline Phosphatase: 341 U/L — ABNORMAL HIGH (ref 38–126)
Anion gap: 8 (ref 5–15)
BUN: 11 mg/dL (ref 6–20)
CO2: 23 mmol/L (ref 22–32)
Calcium: 9.3 mg/dL (ref 8.9–10.3)
Chloride: 106 mmol/L (ref 98–111)
Creatinine, Ser: 0.61 mg/dL (ref 0.44–1.00)
GFR calc Af Amer: >60 mL/min (ref >60)
GFR calc non Af Amer: >60 mL/min (ref >60)
Glucose, Bld: 113 mg/dL — ABNORMAL HIGH (ref 70–99)
Potassium: 3.7 mmol/L (ref 3.5–5.1)
SODIUM: 137 mmol/L (ref 135–145)
Total Bilirubin: 6.9 mg/dL — ABNORMAL HIGH (ref 0.3–1.2)
Total Protein: 7.6 g/dL (ref 6.5–8.1)

## 2018-06-14 LAB — CBC
HCT: 34.4 % — ABNORMAL LOW (ref 36.0–46.0)
Hemoglobin: 11.2 g/dL — ABNORMAL LOW (ref 12.0–15.0)
MCH: 31.2 pg (ref 26.0–34.0)
MCHC: 32.6 g/dL (ref 30.0–36.0)
MCV: 95.8 fL (ref 80.0–100.0)
PLATELETS: 394 10*3/uL (ref 150–400)
RBC: 3.59 MIL/uL — ABNORMAL LOW (ref 3.87–5.11)
RDW: 15.7 % — AB (ref 11.5–15.5)
WBC: 6.3 10*3/uL (ref 4.0–10.5)
nRBC: 0 % (ref 0.0–0.2)

## 2018-06-14 LAB — PREGNANCY, URINE: Preg Test, Ur: NEGATIVE

## 2018-06-14 SURGERY — UPPER ENDOSCOPIC ULTRASOUND (EUS) RADIAL
Anesthesia: General

## 2018-06-14 MED ORDER — MIDAZOLAM HCL 2 MG/2ML IJ SOLN
INTRAMUSCULAR | Status: AC
  Filled 2018-06-14: qty 2

## 2018-06-14 MED ORDER — GLUCAGON HCL RDNA (DIAGNOSTIC) 1 MG IJ SOLR
INTRAMUSCULAR | Status: AC
  Filled 2018-06-14: qty 1

## 2018-06-14 MED ORDER — SUGAMMADEX SODIUM 200 MG/2ML IV SOLN
INTRAVENOUS | Status: DC | PRN
  Administered 2018-06-14: 250 mg via INTRAVENOUS

## 2018-06-14 MED ORDER — INDOMETHACIN 50 MG RE SUPP
RECTAL | Status: AC
  Filled 2018-06-14: qty 2

## 2018-06-14 MED ORDER — FENTANYL CITRATE (PF) 100 MCG/2ML IJ SOLN
INTRAMUSCULAR | Status: AC
  Filled 2018-06-14: qty 2

## 2018-06-14 MED ORDER — LACTATED RINGERS IV SOLN
INTRAVENOUS | Status: DC
  Administered 2018-06-14 (×2): via INTRAVENOUS

## 2018-06-14 MED ORDER — FENTANYL CITRATE (PF) 100 MCG/2ML IJ SOLN
INTRAMUSCULAR | Status: DC | PRN
  Administered 2018-06-14 (×3): 50 ug via INTRAVENOUS
  Administered 2018-06-14: 100 ug via INTRAVENOUS

## 2018-06-14 MED ORDER — ALPRAZOLAM 0.5 MG PO TABS: 0.5000 mg | ORAL_TABLET | Freq: Three times a day (TID) | ORAL | 0 refills | Status: DC | PRN

## 2018-06-14 MED ORDER — INDOMETHACIN 50 MG RE SUPP
RECTAL | Status: DC | PRN
  Administered 2018-06-14: 100 mg via RECTAL

## 2018-06-14 MED ORDER — LIDOCAINE 2% (20 MG/ML) 5 ML SYRINGE
INTRAMUSCULAR | Status: DC | PRN
  Administered 2018-06-14: 100 mg via INTRAVENOUS

## 2018-06-14 MED ORDER — SODIUM CHLORIDE 0.9 % IV SOLN: INTRAVENOUS | Status: DC

## 2018-06-14 MED ORDER — IOPAMIDOL (ISOVUE-M 300) INJECTION 61%
INTRAMUSCULAR | Status: DC | PRN
  Administered 2018-06-14: 30 mL via INTRATHECAL

## 2018-06-14 MED ORDER — CIPROFLOXACIN IN D5W 400 MG/200ML IV SOLN
INTRAVENOUS | Status: AC
  Filled 2018-06-14: qty 200

## 2018-06-14 MED ORDER — MIDAZOLAM HCL 2 MG/2ML IJ SOLN
INTRAMUSCULAR | Status: DC | PRN
  Administered 2018-06-14: 2 mg via INTRAVENOUS

## 2018-06-14 MED ORDER — ONDANSETRON HCL 4 MG/2ML IJ SOLN
INTRAMUSCULAR | Status: DC | PRN
  Administered 2018-06-14: 4 mg via INTRAVENOUS

## 2018-06-14 MED ORDER — ROCURONIUM BROMIDE 10 MG/ML (PF) SYRINGE
PREFILLED_SYRINGE | INTRAVENOUS | Status: DC | PRN
  Administered 2018-06-14 (×2): 10 mg via INTRAVENOUS
  Administered 2018-06-14: 50 mg via INTRAVENOUS

## 2018-06-14 MED ORDER — GLUCAGON HCL RDNA (DIAGNOSTIC) 1 MG IJ SOLR
INTRAMUSCULAR | Status: DC | PRN
  Administered 2018-06-14 (×2): .5 mg via INTRAVENOUS

## 2018-06-14 MED ORDER — DEXAMETHASONE SODIUM PHOSPHATE 10 MG/ML IJ SOLN
INTRAMUSCULAR | Status: DC | PRN
  Administered 2018-06-14: 10 mg via INTRAVENOUS

## 2018-06-14 MED ORDER — PROPOFOL 10 MG/ML IV BOLUS
INTRAVENOUS | Status: AC
  Filled 2018-06-14: qty 20

## 2018-06-14 MED ORDER — CIPROFLOXACIN IN D5W 400 MG/200ML IV SOLN
400.0000 mg | Freq: Once | INTRAVENOUS | Status: AC
  Administered 2018-06-14: 400 mg via INTRAVENOUS

## 2018-06-14 MED ORDER — PROPOFOL 10 MG/ML IV BOLUS
INTRAVENOUS | Status: DC | PRN
  Administered 2018-06-14: 150 mg via INTRAVENOUS

## 2018-06-14 NOTE — Op Note (Signed)
[facility] Patient Name: Hailey Houston Procedure Date: 06/14/2018 MRN: [id number] Attending MD: Milus Banister , MD Date of Birth: June 16, 1970 CSN: [id number] Age: 48 Admit Type: Outpatient Procedure:                Upper EUS Indications:              Suspected mass in pancreas on CT scan (per [facility]                            CT radiologist report), dilated biliary tree,                            periportal adenopathy, stones in GB, elevated liver                            tests, jaundice, pruritis Providers:                Milus Banister, MD, Raynelle Bring, RN, Cleda Daub, RN, Charolette Child, Technician, Danley Danker,                            CRNA Referring MD:              Medicines:                General Anesthesia Complications:            No immediate complications. Estimated blood loss:                            None. Estimated Blood Loss:     Estimated blood loss: none. Procedure:                Pre-Anesthesia Assessment:                           - Prior to the procedure, a History and Physical                            was performed, and patient medications and                            allergies were reviewed. The patient's tolerance of                            previous anesthesia was also reviewed. The risks                            and benefits of the procedure and the sedation                            options and risks were discussed with the patient.                            All questions were answered,  and informed consent                            was obtained. Prior Anticoagulants: The patient has                            taken no previous anticoagulant or antiplatelet                            agents. ASA Grade Assessment: II - A patient with                            mild systemic disease. After reviewing the risks                            and benefits, the patient was deemed in                satisfactory condition to undergo the procedure.                           After obtaining informed consent, the endoscope was                            passed under direct vision. Throughout the                            procedure, the patient's blood pressure, pulse, and                            oxygen saturations were monitored continuously. The                            [id number] ([id number]) Olympus Radial EUS was                            introduced through the mouth, and advanced to the                            second part of duodenum. The upper EUS was                            accomplished without difficulty. The patient                            tolerated the procedure well. Scope In: Scope Out: Findings:      ENDOSCOPIC FINDING: :      1. Normal esophagus and normal stomach.      2. Deeply ulcerated and circumferentially neoplastic appearing duodenum       from the distal bulb and spanning about 2-3cm distally. This appears to       be a malignancy and I biopsied it extensively.      ENDOSONOGRAPHIC FINDING: :      1. The pancreatic parenchmya was abnormal throughout the gland (more       hypoechoic than usual, somewhat honeycombed) but I did  not see a       distinct pancreatic mass.      2. The wall of the duodenum was edematous, mass-like and vaguely       contiguous with the pancreatic head, creating a look of abnormal soft       tissue approximately 2cm by 4cm across.      3. Several abnormal appearing periportal lymphnodes including a chain of       lymphnodes measuring 3cm across.      4. CBD was 63m coursing the the head of the pancreas and then dilated to       186mmore proximally.      5. Main pancreatic duct was diffusely dilated throughout (up to 73m573mn       body/tail) Impression:               - Ulcerated, friable, neoplastic appearing mucosa                            in the duodenum, proximal edge at the distal                             duodenal bulb and then spanning about 2-3cm. This                            was biospied extensively.                           - Irregular soft tissue corrensponding with                            ulcerated, neoplastic duodenum and extending into                            the head of the pancreas very vaguely.                           - Periportal adenopathy.                           - Dilated bile duct and main pancreatic duct.                           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 Moderate Sedation:      Not Applicable - Patient had care per Anesthesia. Recommendation:           - Attempt ERCP now for biliary decompression, may                            be difficult given the duodenal process.                           - [facility]                            is very pruritic. Procedure Code(s):        --- Professional ---  11914, Esophagogastroduodenoscopy, flexible,                            transoral; with endoscopic ultrasound examination                            limited to the esophagus, stomach or duodenum, and                            adjacent structures                           43239, 59, Esophagogastroduodenoscopy, flexible,                            transoral; with biopsy, single or multiple Diagnosis Code(s):        --- Professional ---                           K31.89, Other diseases of stomach and duodenum                           R93.3, Abnormal findings on diagnostic imaging of                            other parts of digestive tract CPT copyright 2018 American Medical Association. All rights reserved. The codes documented in this report are preliminary and upon coder review may  be revised to meet current compliance requirements. Milus Banister, MD 06/14/2018 11:48:28 AM This report has been signed electronically. Number of Addenda: 0

## 2018-06-14 NOTE — Anesthesia Preprocedure Evaluation (Addendum)
Anesthesia Evaluation  Patient identified by MRN, date of birth, ID band Patient awake    Reviewed: Allergy & Precautions, NPO status , Patient's Chart, lab work & pertinent test results  Airway Mallampati: II  TM Distance: >3 FB Neck ROM: Full    Dental no notable dental hx. (+) Teeth Intact, Dental Advisory Given   Pulmonary Current Smoker,    Pulmonary exam normal breath sounds clear to auscultation       Cardiovascular Exercise Tolerance: Good negative cardio ROS Normal cardiovascular exam Rhythm:Regular Rate:Normal     Neuro/Psych PSYCHIATRIC DISORDERS Anxiety Hx of ADDnegative neurological ROS     GI/Hepatic negative GI ROS, Neg liver ROS,   Endo/Other  negative endocrine ROS  Renal/GU negative Renal ROS     Musculoskeletal negative musculoskeletal ROS (+)   Abdominal (+) + obese,   Peds  Hematology negative hematology ROS (+)   Anesthesia Other Findings   Reproductive/Obstetrics                           Anesthesia Physical Anesthesia Plan  ASA: II  Anesthesia Plan: General   Post-op Pain Management:    Induction: Intravenous  PONV Risk Score and Plan: 3 and Ondansetron, Dexamethasone and Treatment may vary due to age or medical condition  Airway Management Planned: Oral ETT  Additional Equipment:   Intra-op Plan:   Post-operative Plan: Extubation in OR  Informed Consent: I have reviewed the patients History and Physical, chart, labs and discussed the procedure including the risks, benefits and alternatives for the proposed anesthesia with the patient or authorized representative who has indicated his/her understanding and acceptance.     Dental advisory given  Plan Discussed with: CRNA  Anesthesia Plan Comments:        Anesthesia Quick Evaluation

## 2018-06-14 NOTE — Discharge Instructions (Signed)
YOU HAD AN ENDOSCOPIC PROCEDURE TODAY: Refer to the procedure report and other information in the discharge instructions given to you for any specific questions about what was found during the examination. If this information does not answer your questions, please call [place] office at [phone] to clarify.   YOU SHOULD EXPECT: Some feelings of bloating in the abdomen. Passage of more gas than usual. Walking can help get rid of the air that was put into your GI tract during the procedure and reduce the bloating. If you had a lower endoscopy (such as a colonoscopy or flexible sigmoidoscopy) you may notice spotting of blood in your stool or on the toilet paper. Some abdominal soreness may be present for a day or two, also.  DIET: Your first meal following the procedure should be a light meal and then it is ok to progress to your normal diet. A half-sandwich or bowl of soup is an example of a good first meal. Heavy or fried foods are harder to digest and may make you feel nauseous or bloated. Drink plenty of fluids but you should avoid alcoholic beverages for 24 hours. If you had a esophageal dilation, please see attached instructions for diet.    ACTIVITY: Your care partner should take you home directly after the procedure. You should plan to take it easy, moving slowly for the rest of the day. You can resume normal activity the day after the procedure however YOU SHOULD NOT DRIVE, use power tools, machinery or perform tasks that involve climbing or major physical exertion for 24 hours (because of the sedation medicines used during the test).   SYMPTOMS TO REPORT IMMEDIATELY: A gastroenterologist can be reached at any hour. Please call [phone]  for any of the following symptoms:   Following upper endoscopy (EGD, EUS, ERCP, esophageal dilation) Vomiting of blood or coffee ground material  New, significant abdominal pain  New, significant chest pain or pain under the shoulder blades  Painful or  persistently difficult swallowing  New shortness of breath  Black, tarry-looking or red, bloody stools  FOLLOW UP:  If any biopsies were taken you will be contacted by phone or by letter within the next 1-3 weeks. Call [phone]  if you have not heard about the biopsies in 3 weeks.  Please also call with any specific questions about appointments or follow up tests.  

## 2018-06-14 NOTE — Anesthesia Postprocedure Evaluation (Signed)
Anesthesia Post Note  Patient: Hailey Houston  Procedure(s) Performed: UPPER ENDOSCOPIC ULTRASOUND (EUS) RADIAL (N/A ) ENDOSCOPIC RETROGRADE CHOLANGIOPANCREATOGRAPHY (ERCP) WITH PROPOFOL (N/A ) BIOPSY SPHINCTEROTOMY BILIARY STENT PLACEMENT (N/A ) BILIARY DILATION     Patient location during evaluation: Endoscopy Anesthesia Type: General Level of consciousness: awake and alert Pain management: pain level controlled Vital Signs Assessment: post-procedure vital signs reviewed and stable Respiratory status: spontaneous breathing, nonlabored ventilation, respiratory function stable and patient connected to nasal cannula oxygen Cardiovascular status: blood pressure returned to baseline and stable Postop Assessment: no apparent nausea or vomiting Anesthetic complications: no    Last Vitals:  Vitals:   06/14/18 1310 06/14/18 1320  BP: (!) 145/73 136/72  Pulse: 76 74  Resp: 19 16  Temp:    SpO2: 97% 98%    Last Pain:  Vitals:   06/14/18 1256  TempSrc: Oral  PainSc: 0-No pain                 Barnet Glasgow

## 2018-06-14 NOTE — Transfer of Care (Signed)
Immediate Anesthesia Transfer of Care Note  Patient: Hailey Houston  Procedure(s) Performed: UPPER ENDOSCOPIC ULTRASOUND (EUS) RADIAL (N/A ) ENDOSCOPIC RETROGRADE CHOLANGIOPANCREATOGRAPHY (ERCP) WITH PROPOFOL (N/A ) BIOPSY SPHINCTEROTOMY BILIARY STENT PLACEMENT (N/A ) BILIARY DILATION  Patient Location: Endoscopy Unit  Anesthesia Type:General  Level of Consciousness: drowsy  Airway & Oxygen Therapy: Patient Spontanous Breathing and Patient connected to face mask oxygen  Post-op Assessment: Report given to RN and Post -op Vital signs reviewed and stable  Post vital signs: Reviewed and stable  Last Vitals:  Vitals Value Taken Time  BP    Temp    Pulse    Resp    SpO2      Last Pain:  Vitals:   06/14/18 0935  TempSrc: Oral  PainSc: 0-No pain         Complications: No apparent anesthesia complications

## 2018-06-14 NOTE — Interval H&P Note (Signed)
History and Physical Interval Note:  06/14/2018 10:03 AM  Hailey Houston  has presented today for surgery, with the diagnosis of possible stent, panc mass, obstructive jaundice, biliary dilation  The various methods of treatment have been discussed with the patient and family. After consideration of risks, benefits and other options for treatment, the patient has consented to  Procedure(s): UPPER ENDOSCOPIC ULTRASOUND (EUS) RADIAL (N/A) ENDOSCOPIC RETROGRADE CHOLANGIOPANCREATOGRAPHY (ERCP) WITH PROPOFOL (N/A) as a surgical intervention .  The patient's history has been reviewed, patient examined, no change in status, stable for surgery.  I have reviewed the patient's chart and labs.  Questions were answered to the patient's satisfaction.     Milus Banister

## 2018-06-14 NOTE — Anesthesia Procedure Notes (Signed)
Procedure Name: Intubation Date/Time: 06/14/2018 10:38 AM Performed by: Sharlette Dense, CRNA Patient Re-evaluated:Patient Re-evaluated prior to induction Oxygen Delivery Method: Circle system utilized Preoxygenation: Pre-oxygenation with 100% oxygen Induction Type: IV induction Ventilation: Mask ventilation without difficulty and Oral airway inserted - appropriate to patient size Grade View: Grade I Tube type: Oral Tube size: 7.0 mm Number of attempts: 1 Airway Equipment and Method: Stylet Placement Confirmation: ETT inserted through vocal cords under direct vision,  positive ETCO2 and breath sounds checked- equal and bilateral Secured at: 21 cm Tube secured with: Tape Dental Injury: Teeth and Oropharynx as per pre-operative assessment

## 2018-06-14 NOTE — Op Note (Addendum)
[facility] Patient Name: Sarea Fyfe Procedure Date: 06/14/2018 MRN: [id number] Attending MD: Milus Banister , MD Date of Birth: June 13, 1970 CSN: [id number] Age: 48 Admit Type: Outpatient Procedure:                ERCP Indications:              obstructed bile duct, jaundice, pruritis due to                            ulcerated duodenal mass described on EUS just prior                            to this exam Providers:                Milus Banister, MD, Raynelle Bring, RN, Cleda Daub, RN, Charolette Child, Technician, Danley Danker,                            CRNA Referring MD:              Medicines:                General Anesthesia, Indomethacin 100 mg PR, Cipro                            932 mg IV Complications:            No immediate complications. Estimated Blood Loss:     Estimated blood loss: none. Procedure:                Pre-Anesthesia Assessment:                           - Prior to the procedure, a History and Physical                            was performed, and patient medications and                            allergies were reviewed. The patient's tolerance of                            previous anesthesia was also reviewed. The risks                            and benefits of the procedure and the sedation                            options and risks were discussed with the patient.                            All questions were answered, and informed consent                            was  obtained. Prior Anticoagulants: The patient has                            taken no previous anticoagulant or antiplatelet                            agents. ASA Grade Assessment: II - A patient with                            mild systemic disease. After reviewing the risks                            and benefits, the patient was deemed in                            satisfactory condition to undergo the procedure.          After obtaining informed consent, the scope was                            passed under direct vision. Throughout the                            procedure, the patient's blood pressure, pulse, and                            oxygen saturations were monitored continuously. The                            [id number]-Q180V ([id number] Olympus duodenoscope was                            introduced through the mouth, and used to inject                            contrast into and used to inject contrast into the                            bile duct. The ERCP was accomplished without                            difficulty. The patient tolerated the procedure                            well. Scope In: Scope Out: Findings:      A duodenoscope was advanced to the region of the major papilla. The       previously described ulcerated duodenal mass was better visualized on       this exam than on EUS just prior and so I repeated mucosal biopsies with       forceps. The mass is circumferential, ulcerated, very friable and       extends from just distal to the duodenal bulb for about 2-3cm. The lumen       through the mass is narrowed but allowed for passage of the duodenoscope  with only mild resistence. The distal end of the mass is about 1cm       proximal to the major papilla. A 44 Autotome over a .035 hydrawire was       used to cannulate the bile duct and contrast was injected. Cholangiogram       revealed a very tight, 1.5cm long distal CBD stricture. The biliary tree       proximal to the stricture was diffusely dilated; CBD 6-33mm, CHD 61mm.       The cystic duct partially opacified. An initial attempt to place a SEMS       across the stricture was not successful (the stricture was too tight). I       then elected to create a limited biliary sphincterotomy and then dilated       the stricute using a 16mm hurricane dilating balloon.. Following balloon       dilation I stented the stricture with  a 4cm long, 63mm diameter fullly       covered SEMS in good position with the distal 7-69mm of the stent       extending across the major papilla. Impression:               - The previously described ulcerated duodenal mass                            was better visualized on this exam than on EUS just                            prior and so I repeated mucosal biopsies with                            forceps. This is very suspicious for duodenal                            cancer. The mass is circumferential, ulcerated,                            very friable and extends from just distal to the                            duodenal bulb for about 2-3cm. The distal end of                            the mass is about 1cm proximal to the major                            papilla. The lumen through the mass is narrowed but                            allowed for passage of the duodenoscope with only                            mild resistence.                           - The mass is  causing a distal CBD stricture,                            biliary obstruction. I treated the stricture with a                            limited biliary sphincterotomy, balloon dilation of                            the stricture and then stenting with a 4cm long,                            65mm fully covered SEMS. Moderate Sedation:      Not Applicable - Patient had care per Anesthesia. Recommendation:           - Discharge patient to home.                           - Await final pathology results to begin oncologic                            referrals. Procedure Code(s):        --- Professional ---                           [phone], Endoscopic retrograde                            cholangiopancreatography (ERCP); with placement of                            endoscopic stent into biliary or pancreatic duct,                            including pre- and post-dilation and guide wire                            passage,  when performed, including sphincterotomy,                            when performed, each stent Diagnosis Code(s):        --- Professional ---                           K83.1, Obstruction of bile duct CPT copyright 2018 American Medical Association. All rights reserved. The codes documented in this report are preliminary and upon coder review may  be revised to meet current compliance requirements. Milus Banister, MD 06/14/2018 1:21:19 PM This report has been signed electronically. Number of Addenda: 0

## 2018-06-15 ENCOUNTER — Telehealth: Payer: Self-pay

## 2018-06-15 ENCOUNTER — Encounter (HOSPITAL_COMMUNITY): Payer: Self-pay | Admitting: Gastroenterology

## 2018-06-15 ENCOUNTER — Telehealth: Payer: Self-pay | Admitting: Gastroenterology

## 2018-06-15 MED ORDER — HYDROCODONE-ACETAMINOPHEN 5-325 MG PO TABS: 1.0000 | ORAL_TABLET | Freq: Four times a day (QID) | ORAL | 0 refills | Status: DC | PRN

## 2018-06-15 NOTE — Telephone Encounter (Signed)
Angie Fava, LPN  Timothy Lasso, RN      Previous Messages    ----- Message -----  From: Milus Banister, MD  Sent: 06/15/2018 11:02 AM EST  To: Angie Fava, LPN  Subject: RE: pt call                    I did prescribe some xanax for anxiety but we did not talk about meds for itching or muscle relaxors at all. Advise her to try benedryl for the itching (25mg  pill 1-2 times a day, PRN, OTC). I do not advise that she try any muscle relaxors at all. If her pains are not much better in the next 1-2 hours, she needs labs (cbc, cmet, lipase, amylase)   thanks   ----- Message -----  From: Angie Fava, LPN  Sent: 06/25/4707  9:54 AM EST  To: Milus Banister, MD  Subject: FW: pt call                    Dr Ardis Hughs, I spoke to patient who states her pain is subsiding this morning. Dr Lyndel Safe told her to take some Advil,and that has been helpful. She said that you were going to prescribe her a muscle relaxer and medication for itching. She is taking Xanax at this time to help with her symptoms. Please advise.    Thanks Claiborne Billings      ----- Message -----  From: Jackquline Denmark, MD  Sent: 06/15/2018  8:41 AM EST  To: Angie Fava, LPN  Subject: pt call                      Hi Hailey Houston (DOB May 27, 1970)-called around 730AM today  Had EUS and ERCP with Dan yesterday  Was having pain  Can you please call and check on her. May have to run it by [employer]

## 2018-06-15 NOTE — Telephone Encounter (Signed)
Angie Fava, LPN  Timothy Lasso, RN      Previous Messages    ----- Message -----  From: Milus Banister, MD  Sent: 06/15/2018 11:02 AM EST  To: Angie Fava, LPN  Subject: RE: pt call                    I did prescribe some xanax for anxiety but we did not talk about meds for itching or muscle relaxors at all. Advise her to try benedryl for the itching (25mg  pill 1-2 times a day, PRN, OTC). I do not advise that she try any muscle relaxors at all. If her pains are not much better in the next 1-2 hours, she needs labs (cbc, cmet, lipase, amylase)   thanks   ----- Message -----  From: Angie Fava, LPN  Sent: 0/17/5102  9:54 AM EST  To: Milus Banister, MD  Subject: FW: pt call                    Dr Ardis Hughs, I spoke to patient who states her pain is subsiding this morning. Dr Lyndel Safe told her to take some Advil,and that has been helpful. She said that you were going to prescribe her a muscle relaxer and medication for itching. She is taking Xanax at this time to help with her symptoms. Please advise.    Thanks Claiborne Billings      ----- Message -----  From: Jackquline Denmark, MD  Sent: 06/15/2018  8:41 AM EST  To: Angie Fava, LPN  Subject: pt call                      Hi Chapman Fitch Nalepa (DOB 1971/02/14)-called around 730AM today  Had EUS and ERCP with Dan yesterday  Was having pain  Can you please call and check on her. May have to run it by [employer]

## 2018-06-15 NOTE — Telephone Encounter (Signed)
The pt was advised of Dr Ardis Hughs recommendations and prescription printed and left at the front desk for pick up.

## 2018-06-15 NOTE — Telephone Encounter (Signed)
Patient's mother called and states that pt was supposed to get pain meds after procedure yesterday but nothing was sent to her pharmacy, she also needs something for "itching"

## 2018-06-15 NOTE — Telephone Encounter (Signed)
Claiborne Billings was working on this as well.  We actually did not talk about pain meds but I'm happy to prescribe some.   vicodin 7;125, take 1-2 every 6 hours prn, disp 50, no refills.  Thanks

## 2018-06-16 ENCOUNTER — Other Ambulatory Visit: Payer: Self-pay

## 2018-06-16 ENCOUNTER — Inpatient Hospital Stay (HOSPITAL_COMMUNITY): Payer: 59

## 2018-06-16 ENCOUNTER — Emergency Department (HOSPITAL_COMMUNITY): Payer: 59

## 2018-06-16 ENCOUNTER — Telehealth: Payer: Self-pay | Admitting: Nurse Practitioner

## 2018-06-16 ENCOUNTER — Inpatient Hospital Stay (HOSPITAL_COMMUNITY)
Admission: EM | Admit: 2018-06-16 | Discharge: 2018-06-30 | DRG: 988 | Disposition: A | Discharge status: Home Health | Payer: 59 | Attending: Internal Medicine | Admitting: Internal Medicine

## 2018-06-16 ENCOUNTER — Encounter (HOSPITAL_COMMUNITY): Payer: Self-pay | Admitting: Emergency Medicine

## 2018-06-16 DIAGNOSIS — I10 Essential (primary) hypertension: Secondary | ICD-10-CM | POA: Diagnosis present

## 2018-06-16 DIAGNOSIS — Y848 Other medical procedures as the cause of abnormal reaction of the patient, or of later complication, without mention of misadventure at the time of the procedure: Secondary | ICD-10-CM | POA: Diagnosis present

## 2018-06-16 DIAGNOSIS — C781 Secondary malignant neoplasm of mediastinum: Secondary | ICD-10-CM | POA: Diagnosis present

## 2018-06-16 DIAGNOSIS — C801 Malignant (primary) neoplasm, unspecified: Secondary | ICD-10-CM

## 2018-06-16 DIAGNOSIS — D6489 Other specified anemias: Secondary | ICD-10-CM | POA: Diagnosis not present

## 2018-06-16 DIAGNOSIS — K858 Other acute pancreatitis without necrosis or infection: Secondary | ICD-10-CM | POA: Diagnosis present

## 2018-06-16 DIAGNOSIS — R918 Other nonspecific abnormal finding of lung field: Secondary | ICD-10-CM | POA: Diagnosis present

## 2018-06-16 DIAGNOSIS — K859 Acute pancreatitis without necrosis or infection, unspecified: Secondary | ICD-10-CM | POA: Diagnosis not present

## 2018-06-16 DIAGNOSIS — F419 Anxiety disorder, unspecified: Secondary | ICD-10-CM | POA: Diagnosis present

## 2018-06-16 DIAGNOSIS — R1013 Epigastric pain: Secondary | ICD-10-CM | POA: Diagnosis present

## 2018-06-16 DIAGNOSIS — C179 Malignant neoplasm of small intestine, unspecified: Secondary | ICD-10-CM | POA: Diagnosis not present

## 2018-06-16 DIAGNOSIS — K828 Other specified diseases of gallbladder: Secondary | ICD-10-CM | POA: Diagnosis present

## 2018-06-16 DIAGNOSIS — R590 Localized enlarged lymph nodes: Secondary | ICD-10-CM | POA: Diagnosis present

## 2018-06-16 DIAGNOSIS — E876 Hypokalemia: Secondary | ICD-10-CM | POA: Diagnosis present

## 2018-06-16 DIAGNOSIS — J45909 Unspecified asthma, uncomplicated: Secondary | ICD-10-CM | POA: Diagnosis present

## 2018-06-16 DIAGNOSIS — Z8249 Family history of ischemic heart disease and other diseases of the circulatory system: Secondary | ICD-10-CM

## 2018-06-16 DIAGNOSIS — Z79899 Other long term (current) drug therapy: Secondary | ICD-10-CM

## 2018-06-16 DIAGNOSIS — C779 Secondary and unspecified malignant neoplasm of lymph node, unspecified: Secondary | ICD-10-CM | POA: Diagnosis present

## 2018-06-16 DIAGNOSIS — K5900 Constipation, unspecified: Secondary | ICD-10-CM | POA: Diagnosis not present

## 2018-06-16 DIAGNOSIS — E119 Type 2 diabetes mellitus without complications: Secondary | ICD-10-CM | POA: Diagnosis present

## 2018-06-16 DIAGNOSIS — R1010 Upper abdominal pain, unspecified: Secondary | ICD-10-CM | POA: Diagnosis not present

## 2018-06-16 DIAGNOSIS — F17200 Nicotine dependence, unspecified, uncomplicated: Secondary | ICD-10-CM | POA: Diagnosis present

## 2018-06-16 DIAGNOSIS — R911 Solitary pulmonary nodule: Secondary | ICD-10-CM | POA: Diagnosis not present

## 2018-06-16 DIAGNOSIS — F909 Attention-deficit hyperactivity disorder, unspecified type: Secondary | ICD-10-CM | POA: Diagnosis present

## 2018-06-16 DIAGNOSIS — Z6833 Body mass index (BMI) 33.0-33.9, adult: Secondary | ICD-10-CM

## 2018-06-16 DIAGNOSIS — K9189 Other postprocedural complications and disorders of digestive system: Secondary | ICD-10-CM | POA: Diagnosis not present

## 2018-06-16 DIAGNOSIS — K831 Obstruction of bile duct: Secondary | ICD-10-CM | POA: Diagnosis not present

## 2018-06-16 DIAGNOSIS — E440 Moderate protein-calorie malnutrition: Secondary | ICD-10-CM | POA: Diagnosis not present

## 2018-06-16 DIAGNOSIS — Z885 Allergy status to narcotic agent status: Secondary | ICD-10-CM

## 2018-06-16 DIAGNOSIS — G8929 Other chronic pain: Secondary | ICD-10-CM | POA: Diagnosis not present

## 2018-06-16 DIAGNOSIS — R238 Other skin changes: Secondary | ICD-10-CM | POA: Diagnosis not present

## 2018-06-16 DIAGNOSIS — R112 Nausea with vomiting, unspecified: Secondary | ICD-10-CM | POA: Diagnosis not present

## 2018-06-16 DIAGNOSIS — C170 Malignant neoplasm of duodenum: Secondary | ICD-10-CM

## 2018-06-16 DIAGNOSIS — K760 Fatty (change of) liver, not elsewhere classified: Secondary | ICD-10-CM | POA: Diagnosis present

## 2018-06-16 DIAGNOSIS — E669 Obesity, unspecified: Secondary | ICD-10-CM | POA: Diagnosis present

## 2018-06-16 DIAGNOSIS — Z79891 Long term (current) use of opiate analgesic: Secondary | ICD-10-CM

## 2018-06-16 DIAGNOSIS — D649 Anemia, unspecified: Secondary | ICD-10-CM | POA: Diagnosis not present

## 2018-06-16 DIAGNOSIS — R110 Nausea: Secondary | ICD-10-CM | POA: Diagnosis not present

## 2018-06-16 DIAGNOSIS — K8021 Calculus of gallbladder without cholecystitis with obstruction: Secondary | ICD-10-CM | POA: Diagnosis present

## 2018-06-16 DIAGNOSIS — Z7189 Other specified counseling: Secondary | ICD-10-CM

## 2018-06-16 DIAGNOSIS — E860 Dehydration: Secondary | ICD-10-CM | POA: Diagnosis present

## 2018-06-16 DIAGNOSIS — K8500 Idiopathic acute pancreatitis without necrosis or infection: Secondary | ICD-10-CM | POA: Diagnosis not present

## 2018-06-16 DIAGNOSIS — Z791 Long term (current) use of non-steroidal anti-inflammatories (NSAID): Secondary | ICD-10-CM

## 2018-06-16 DIAGNOSIS — D571 Sickle-cell disease without crisis: Secondary | ICD-10-CM | POA: Diagnosis present

## 2018-06-16 DIAGNOSIS — K3189 Other diseases of stomach and duodenum: Secondary | ICD-10-CM

## 2018-06-16 DIAGNOSIS — R12 Heartburn: Secondary | ICD-10-CM | POA: Diagnosis not present

## 2018-06-16 HISTORY — DX: Malignant neoplasm of duodenum: C17.0

## 2018-06-16 LAB — CBC WITH DIFFERENTIAL/PLATELET
Abs Immature Granulocytes: 0.05 10*3/uL (ref 0.00–0.07)
BASOS ABS: 0 10*3/uL (ref 0.0–0.1)
Basophils Relative: 0 %
EOS PCT: 1 %
Eosinophils Absolute: 0.1 10*3/uL (ref 0.0–0.5)
HCT: 33.8 % — ABNORMAL LOW (ref 36.0–46.0)
Hemoglobin: 10.8 g/dL — ABNORMAL LOW (ref 12.0–15.0)
Immature Granulocytes: 1 %
LYMPHS ABS: 1.6 10*3/uL (ref 0.7–4.0)
Lymphocytes Relative: 15 %
MCH: 31.5 pg (ref 26.0–34.0)
MCHC: 32 g/dL (ref 30.0–36.0)
MCV: 98.5 fL (ref 80.0–100.0)
Monocytes Absolute: 1.2 10*3/uL — ABNORMAL HIGH (ref 0.1–1.0)
Monocytes Relative: 11 %
Neutro Abs: 7.7 10*3/uL (ref 1.7–7.7)
Neutrophils Relative %: 72 %
Platelets: 382 10*3/uL (ref 150–400)
RBC: 3.43 MIL/uL — ABNORMAL LOW (ref 3.87–5.11)
RDW: 14.6 % (ref 11.5–15.5)
WBC: 10.6 10*3/uL — ABNORMAL HIGH (ref 4.0–10.5)
nRBC: 0 % (ref 0.0–0.2)

## 2018-06-16 LAB — I-STAT BETA HCG BLOOD, ED (MC, WL, AP ONLY): I-stat hCG, quantitative: <5 m[IU]/mL (ref <5)

## 2018-06-16 LAB — COMPREHENSIVE METABOLIC PANEL
ALT: 322 U/L — ABNORMAL HIGH (ref 0–44)
ANION GAP: 10 (ref 5–15)
AST: 118 U/L — ABNORMAL HIGH (ref 15–41)
Albumin: 4 g/dL (ref 3.5–5.0)
Alkaline Phosphatase: 278 U/L — ABNORMAL HIGH (ref 38–126)
BUN: 13 mg/dL (ref 6–20)
CO2: 25 mmol/L (ref 22–32)
Calcium: 8.9 mg/dL (ref 8.9–10.3)
Chloride: 101 mmol/L (ref 98–111)
Creatinine, Ser: 0.69 mg/dL (ref 0.44–1.00)
GFR calc Af Amer: >60 mL/min (ref >60)
GFR calc non Af Amer: >60 mL/min (ref >60)
GLUCOSE: 107 mg/dL — AB (ref 70–99)
Potassium: 3 mmol/L — ABNORMAL LOW (ref 3.5–5.1)
Sodium: 136 mmol/L (ref 135–145)
TOTAL PROTEIN: 7.6 g/dL (ref 6.5–8.1)
Total Bilirubin: 2.8 mg/dL — ABNORMAL HIGH (ref 0.3–1.2)

## 2018-06-16 LAB — PROTIME-INR
INR: 1.05
Prothrombin Time: 13.6 seconds (ref 11.4–15.2)

## 2018-06-16 LAB — PHOSPHORUS: Phosphorus: 3.7 mg/dL (ref 2.5–4.6)

## 2018-06-16 LAB — LIPASE, BLOOD: Lipase: 348 U/L — ABNORMAL HIGH (ref 11–51)

## 2018-06-16 LAB — MAGNESIUM: Magnesium: 1.9 mg/dL (ref 1.7–2.4)

## 2018-06-16 MED ORDER — SODIUM CHLORIDE (PF) 0.9 % IJ SOLN
INTRAMUSCULAR | Status: AC
  Filled 2018-06-16: qty 50

## 2018-06-16 MED ORDER — ONDANSETRON HCL 4 MG/2ML IJ SOLN
4.0000 mg | Freq: Once | INTRAMUSCULAR | Status: AC
  Administered 2018-06-16: 4 mg via INTRAVENOUS
  Filled 2018-06-16: qty 2

## 2018-06-16 MED ORDER — ONDANSETRON HCL 4 MG/2ML IJ SOLN
4.0000 mg | Freq: Four times a day (QID) | INTRAMUSCULAR | Status: DC | PRN
  Administered 2018-06-22 – 2018-06-28 (×20): 4 mg via INTRAVENOUS
  Filled 2018-06-16 (×22): qty 2

## 2018-06-16 MED ORDER — ONDANSETRON HCL 4 MG PO TABS
4.0000 mg | ORAL_TABLET | Freq: Four times a day (QID) | ORAL | Status: DC | PRN
  Administered 2018-06-28 – 2018-06-30 (×5): 4 mg via ORAL
  Filled 2018-06-16 (×6): qty 1

## 2018-06-16 MED ORDER — IOPAMIDOL (ISOVUE-300) INJECTION 61%
INTRAVENOUS | Status: AC
  Administered 2018-06-16: 100 mL
  Filled 2018-06-16: qty 100

## 2018-06-16 MED ORDER — POTASSIUM CHLORIDE 10 MEQ/100ML IV SOLN
10.0000 meq | INTRAVENOUS | Status: AC
  Administered 2018-06-16: 10 meq via INTRAVENOUS
  Filled 2018-06-16: qty 100

## 2018-06-16 MED ORDER — PANTOPRAZOLE SODIUM 40 MG IV SOLR
40.0000 mg | INTRAVENOUS | Status: DC
  Administered 2018-06-16 – 2018-06-26 (×11): 40 mg via INTRAVENOUS
  Filled 2018-06-16 (×11): qty 40

## 2018-06-16 MED ORDER — HYDROMORPHONE HCL 1 MG/ML IJ SOLN
1.0000 mg | Freq: Once | INTRAMUSCULAR | Status: AC
  Administered 2018-06-16: 1 mg via INTRAVENOUS
  Filled 2018-06-16: qty 1

## 2018-06-16 MED ORDER — POLYETHYLENE GLYCOL 3350 17 G PO PACK
17.0000 g | PACK | Freq: Two times a day (BID) | ORAL | Status: DC
  Filled 2018-06-16: qty 1

## 2018-06-16 MED ORDER — HYDROMORPHONE HCL 1 MG/ML IJ SOLN
1.0000 mg | INTRAMUSCULAR | Status: DC | PRN
  Administered 2018-06-16: 1 mg via INTRAVENOUS
  Administered 2018-06-17 (×5): 2 mg via INTRAVENOUS
  Filled 2018-06-16 (×5): qty 2
  Filled 2018-06-16: qty 1

## 2018-06-16 MED ORDER — HYDROMORPHONE HCL 1 MG/ML IJ SOLN
0.5000 mg | INTRAMUSCULAR | Status: DC | PRN
  Administered 2018-06-16: 1 mg via INTRAVENOUS
  Filled 2018-06-16: qty 1

## 2018-06-16 MED ORDER — MORPHINE SULFATE (PF) 4 MG/ML IV SOLN
4.0000 mg | Freq: Once | INTRAVENOUS | Status: DC
  Administered 2018-06-16: 4 mg via INTRAVENOUS
  Filled 2018-06-16: qty 1

## 2018-06-16 MED ORDER — SODIUM CHLORIDE 0.9 % IV SOLN: INTRAVENOUS | Status: DC

## 2018-06-16 MED ORDER — ACETAMINOPHEN 325 MG PO TABS: 650.0000 mg | ORAL_TABLET | Freq: Four times a day (QID) | ORAL | Status: DC | PRN

## 2018-06-16 MED ORDER — HYDROMORPHONE HCL 1 MG/ML IJ SOLN: 1.0000 mg | Freq: Once | INTRAMUSCULAR | Status: DC

## 2018-06-16 MED ORDER — SODIUM CHLORIDE 0.9 % IV BOLUS
1000.0000 mL | Freq: Once | INTRAVENOUS | Status: AC
  Administered 2018-06-16: 1000 mL via INTRAVENOUS

## 2018-06-16 MED ORDER — ONDANSETRON HCL 4 MG/2ML IJ SOLN
4.0000 mg | Freq: Four times a day (QID) | INTRAMUSCULAR | Status: DC | PRN
  Administered 2018-06-17 (×2): 4 mg via INTRAVENOUS

## 2018-06-16 MED ORDER — HYDROMORPHONE HCL 1 MG/ML IJ SOLN
0.5000 mg | Freq: Once | INTRAMUSCULAR | Status: AC
  Administered 2018-06-16: 0.5 mg via INTRAVENOUS
  Filled 2018-06-16: qty 1

## 2018-06-16 MED ORDER — POTASSIUM CHLORIDE 10 MEQ/100ML IV SOLN
10.0000 meq | INTRAVENOUS | Status: AC
  Administered 2018-06-16 – 2018-06-17 (×3): 10 meq via INTRAVENOUS
  Filled 2018-06-16 (×2): qty 100

## 2018-06-16 MED ORDER — ACETAMINOPHEN 650 MG RE SUPP: 650.0000 mg | Freq: Four times a day (QID) | RECTAL | Status: DC | PRN

## 2018-06-16 MED ORDER — ENOXAPARIN SODIUM 40 MG/0.4ML ~~LOC~~ SOLN
40.0000 mg | SUBCUTANEOUS | Status: DC
  Administered 2018-06-16 – 2018-06-20 (×5): 40 mg via SUBCUTANEOUS
  Filled 2018-06-16 (×6): qty 0.4

## 2018-06-16 MED ORDER — SODIUM CHLORIDE 0.9 % IV SOLN
INTRAVENOUS | Status: AC
  Administered 2018-06-16 – 2018-06-17 (×3): via INTRAVENOUS

## 2018-06-16 MED ORDER — SODIUM CHLORIDE 0.9 % IV SOLN
INTRAVENOUS | Status: AC
  Administered 2018-06-17: 07:00:00 via INTRAVENOUS

## 2018-06-16 NOTE — ED Triage Notes (Signed)
Patient reports she had EGD on Thursday. Reports recent diagnosis of pancreatic mass and colon cancer. C/o severe pain from lower abdomen to esophagus since that time. Reports nausea. Denies vomiting and diarrhea.

## 2018-06-16 NOTE — Telephone Encounter (Signed)
Patient's mother called the answering service.  Patient had ERCP/EUS for evaluation of pancreatic mass on CT scan.  No discrete mass of the pancreas was found but duodenum severely abnormal . It was causing a distal CBD stricture.  A limited biliary sphincterotomy and balloon dilation of the stricture were performed then stent placed.  Extensive biopsies of the duodenum have resulted and this is adenocarcinoma.   Patient has been having pain since the procedures.  She called our office, prescribed Vicodin but per mother the Vicodin is nowhere near even touching the pain.  Patient is writhing in pain.   I advised patient's mother to bring her to the ED now for further evaluation

## 2018-06-16 NOTE — ED Notes (Signed)
ED TO INPATIENT HANDOFF REPORT  Name/Age/Gender Hailey Houston 48 y.o. female  Code Status Code Status History    Date Active Date Inactive Code Status Order ID Comments User Context   08/29/2013 1308 08/30/2013 1334 Full Code [id number]  [name], MD Inpatient      Home/SNF/Other Home  Chief Complaint Post Operation Pain   Level of Care/Admitting Diagnosis ED Disposition    ED Disposition Condition [place] [[id number]]  Level of Care: Telemetry [5]  Admit to tele based on following criteria: Other see comments  Comments: hypokalemia  Diagnosis: Acute pancreatitis [577.0.ICD-9-CM]  Admitting Physician: Toy Baker [[id number]]  Attending Physician: Toy Baker [[id number]]  Estimated length of stay: 3 - 4 days  Certification:: I certify this patient will need inpatient services for at least 2 midnights  PT Class (Do Not Modify): Inpatient [101]  PT Acc Code (Do Not Modify): Private [1]       Medical History Past Medical History:  Diagnosis Date  . ADD (attention deficit disorder)   . 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 Anxiety   . Family history of adverse reaction to anesthesia    mom goes into afib  . Wears glasses    driving    Allergies Allergies  Allergen Reactions  . Oxycodone Itching    IV Location/Drains/Wounds Patient Lines/Drains/Airways Status   Active Line/Drains/Airways    Name:   Placement date:   Placement time:   Site:   Days:   Peripheral IV 06/16/18 Left Antecubital   06/16/18    1556    Antecubital   less than 1   GI Stent 10 Fr.   06/14/18    1235    -   2   Incision (Closed) 08/29/13 Leg Left   08/29/13    1047     1752   Incision (Closed) 08/29/13 Knee Left   08/29/13    1049     1752          Labs/Imaging Results for orders placed or performed during the hospital encounter of 06/16/18 (from the past 48 hour(s))  CBC with Differential     Status:  Abnormal   Collection Time: 06/16/18  3:49 PM  Result Value Ref Range   WBC 10.6 (H) 4.0 - 10.5 K/uL   RBC 3.43 (L) 3.87 - 5.11 MIL/uL   Hemoglobin 10.8 (L) 12.0 - 15.0 g/dL   HCT 33.8 (L) 36.0 - 46.0 %   MCV 98.5 80.0 - 100.0 fL   MCH 31.5 26.0 - 34.0 pg   MCHC 32.0 30.0 - 36.0 g/dL   RDW 14.6 11.5 - 15.5 %   Platelets 382 150 - 400 K/uL   nRBC 0.0 0.0 - 0.2 %   Neutrophils Relative % 72 %   Neutro Abs 7.7 1.7 - 7.7 K/uL   Lymphocytes Relative 15 %   Lymphs Abs 1.6 0.7 - 4.0 K/uL   Monocytes Relative 11 %   Monocytes Absolute 1.2 (H) 0.1 - 1.0 K/uL   Eosinophils Relative 1 %   Eosinophils Absolute 0.1 0.0 - 0.5 K/uL   Basophils Relative 0 %   Basophils Absolute 0.0 0.0 - 0.1 K/uL   Immature Granulocytes 1 %   Abs Immature Granulocytes 0.05 0.00 - 0.07 K/uL    Comment: Performed at [facility], [place] [address] [place], [place] [address]  Comprehensive metabolic panel     Status: Abnormal  Collection Time: 06/16/18  3:49 PM  Result Value Ref Range   Sodium 136 135 - 145 mmol/L   Potassium 3.0 (L) 3.5 - 5.1 mmol/L   Chloride 101 98 - 111 mmol/L   CO2 25 22 - 32 mmol/L   Glucose, Bld 107 (H) 70 - 99 mg/dL   BUN 13 6 - 20 mg/dL   Creatinine, Ser 0.69 0.44 - 1.00 mg/dL   Calcium 8.9 8.9 - 10.3 mg/dL   Total Protein 7.6 6.5 - 8.1 g/dL   Albumin 4.0 3.5 - 5.0 g/dL   AST 118 (H) 15 - 41 U/L   ALT 322 (H) 0 - 44 U/L   Alkaline Phosphatase 278 (H) 38 - 126 U/L   Total Bilirubin 2.8 (H) 0.3 - 1.2 mg/dL   GFR calc non Af Amer >60 >60 mL/min   GFR calc Af Amer >60 >60 mL/min   Anion gap 10 5 - 15    Comment: Performed at [facility], [place] [address] [place], [place] [address]  Lipase, blood     Status: Abnormal   Collection Time: 06/16/18  3:49 PM  Result Value Ref Range   Lipase 348 (H) 11 - 51 U/L    Comment: Performed at [facility], [place] [address], [place], [place] [address]  Magnesium     Status: None    Collection Time: 06/16/18  3:49 PM  Result Value Ref Range   Magnesium 1.9 1.7 - 2.4 mg/dL    Comment: Performed at [facility], [place] [address] [place], [place] [address]  Phosphorus     Status: None   Collection Time: 06/16/18  3:49 PM  Result Value Ref Range   Phosphorus 3.7 2.5 - 4.6 mg/dL    Comment: Performed at [facility], [place] [address] [place], [place] [address]  I-Stat beta hCG blood, ED     Status: None   Collection Time: 06/16/18  4:07 PM  Result Value Ref Range   I-stat hCG, quantitative <5.0 <5 mIU/mL   Comment 3            Comment:   GEST. AGE      CONC.  (mIU/mL)   <=1 WEEK        5 - 50     2 WEEKS       50 - 500     3 WEEKS       100 - 10,000     4 WEEKS     1,000 - 30,000        FEMALE AND NON-PREGNANT FEMALE:     LESS THAN 5 mIU/mL    Ct Abdomen Pelvis W Contrast  Result Date: 06/16/2018 CLINICAL DATA:  Per patient ECG Thursday recent diagnosis pancreatic mass and colon caMid-Severe lower abdomen pain nauseaNo prior surgeriesPain since procedure, no fever. EXAM: CT ABDOMEN AND PELVIS WITH CONTRAST TECHNIQUE: Multidetector CT imaging of the abdomen and pelvis was performed using the standard protocol following bolus administration of intravenous contrast. CONTRAST:  157mL ISOVUE-300 IOPAMIDOL (ISOVUE-300) INJECTION 61% COMPARISON:  None. FINDINGS: Lower chest: Multiple lung base nodules consistent with metastatic disease. Two largest on the left, 6 mm nodule, subpleural lateral left lower lobe, image 15, series 4 and 7 mm nodule, posteromedial left lower lobe, image 27. Largest on the right, 6 mm posterior inferior lower lobe nodule, image 23. No acute findings at the lung bases. Hepatobiliary: Liver normal in size and attenuation. No masses or focal  lesions. There is intrahepatic biliary air. Gallbladder is mildly distended. It contains dense fluid had at least 1 discrete stone. No wall thickening or adjacent inflammation. There is  air within the common bile duct. A distal common bile duct stent extends across the pancreatic head to the second portion of the duodenum. Pancreas: There is fullness of the pancreatic head without a defined mass. Pancreatic duct is dilated to a maximum of 5 mm. There is hazy inflammatory change in the peripancreatic fat tracking along the gastrohepatic ligament. Multiple peripancreatic subcentimeter lymph nodes are noted. Spleen: Normal in size without focal abnormality. Adrenals/Urinary Tract: No adrenal masses. No renal masses. 3-4 mm nonobstructing stone in the upper pole the right kidney. No other intrarenal stones. No hydronephrosis. Ureters are normal in course and in caliber. Bladder is unremarkable. Stomach/Bowel: Mild wall thickening of the duodenum adjacent to the pancreatic head, presumed reactive. Stomach is unremarkable. Small bowel is normal in caliber with no wall thickening or inflammation. Colon is normal in caliber with no wall thickening or inflammation. No CT evidence of a colonic mass. Normal appendix visualized. Vascular/Lymphatic: There are prominent retroperitoneal lymph nodes all subcentimeter short axis. Portal vein, superior mesenteric vein and splenic vein are patent. No aortic atherosclerosis. Reproductive: Well-positioned IUD. Uterus and adnexa otherwise unremarkable. Other: No abdominal wall hernia.  No ascites. Musculoskeletal: No fracture or acute finding. No osteoblastic or osteolytic lesions. Disc degenerative changes at L2-L3. IMPRESSION: 1. There are peripancreatic inflammatory changes consistent with pancreatitis. No discrete fluid collection is seen to suggest a pseudocyst. There is no venous thrombosis and no evidence of pancreatic necrosis. No other evidence of an acute abnormality. 2. Fullness of the pancreatic head. No defined mass. Pancreatic duct is dilated to 5 mm. 3. Multiple subcentimeter peripancreatic lymph nodes. Given the history of pancreatic carcinoma this is  suspicious for metastatic adenopathy. 4. Well-positioned distal common bile duct stent. There is intrahepatic biliary and common bile duct air. 5. No visualized colonic mass. 6. Multiple lung base nodules consistent with metastatic disease. 7. Nonobstructing stone in the upper pole of the right kidney. Electronically Signed   By: Lajean Manes M.D.   On: 06/16/2018 17:13   EKG Interpretation  Date/Time:  Saturday June 16 2018 15:51:59 EST Ventricular Rate:  86 PR Interval:    QRS Duration: 87 QT Interval:  376 QTC Calculation: 450 R Axis:   16 Text Interpretation:  Sinus rhythm Low voltage, precordial leads RSR' in V1 or V2, right VCD or RVH Baseline wander in lead(s) II III aVF No old tracing to compare Confirmed by Lacretia Leigh ([id number]) on 06/16/2018 4:23:17 PM   Pending Labs Unresulted Labs (From admission, onward)    Start     Ordered   06/16/18 1556  Protime-INR  Once,   STAT     06/16/18 1555   Signed and Held  HIV antibody (Routine Testing)  Tomorrow morning,   R     Signed and Held   Signed and Held  Magnesium  Tomorrow morning,   R    Comments:  Call MD if <1.5    Signed and Held   Signed and Held  Phosphorus  Tomorrow morning,   R     Signed and Held   Signed and Held  TSH  Once,   R    Comments:  Cancel if already done within 1 month and notify MD    Signed and Held   Signed and Held  Comprehensive metabolic panel  Once,  R    Comments:  Cal MD for K<3.5 or >5.0    Signed and Held   Signed and Held  CBC  Once,   R    Comments:  Call for hg <8.0    Signed and Held          Vitals/Pain Today's Vitals   06/16/18 1727 06/16/18 1747 06/16/18 1839 06/16/18 1921  BP:  117/68    Pulse:  79    Resp:  14    Temp:  98.9 F (37.2 C)    TempSrc:  Oral    SpO2:  98%    PainSc: 10-Worst pain ever  4  2     Isolation Precautions No active isolations  Medications Medications  sodium chloride (PF) 0.9 % injection (has no administration in time range)   potassium chloride 10 mEq in 100 mL IVPB (10 mEq Intravenous New Bag/Given 06/16/18 1851)  HYDROmorphone (DILAUDID) injection 0.5-1 mg (has no administration in time range)  potassium chloride 10 mEq in 100 mL IVPB (has no administration in time range)  sodium chloride 0.9 % bolus 1,000 mL (0 mLs Intravenous Stopped 06/16/18 1728)  ondansetron (ZOFRAN) injection 4 mg (4 mg Intravenous Given 06/16/18 1601)  iopamidol (ISOVUE-300) 61 % injection (100 mLs  Contrast Given 06/16/18 1640)  HYDROmorphone (DILAUDID) injection 0.5 mg (0.5 mg Intravenous Given 06/16/18 1635)  HYDROmorphone (DILAUDID) injection 1 mg (1 mg Intravenous Given 06/16/18 1744)  HYDROmorphone (DILAUDID) injection 1 mg (1 mg Intravenous Given 06/16/18 1851)    Mobility walks

## 2018-06-16 NOTE — Consult Note (Signed)
Referring Provider:  Wyn Quaker, PA-C Primary Care Physician:  Patient, No Pcp Per Primary Gastroenterologist:  Dr. Phillip Heal  Reason for Consultation:  Upper abdominal pain, recent   HPI: Hailey Houston is a 48 y.o. female with a recent diagnosis of duodenal adenocarcinoma causing upper abdominal pain and weight loss but also biliary obstruction returning with upper abdominal pain.  She underwent an EUS and ERCP on 06/14/2018 with Dr. Ardis Hughs.  During the studies EUS showed the malignant mass appearing to originate from the duodenum rather than the pancreas.  There were multiple lymph nodes enlarged and a biliary stricture.  The mass was sampled.  ERCP was performed and the bile duct was cannulated.  There was a limited sphincterotomy followed by balloon dilation and the placement of a metallic biliary stent.  She reports that a few hours after returning home from her ERCP/EUS she developed upper abdominal pain which has progressively worsened.  Now she rates this 10 out of 10 has had a hard time lying or remaining still.  Very little appetite.  No bowel movement in about 6 days.  Has passed flatus.  Nauseous without vomiting.  No fevers or chills at home.  He has had diffuse itching   Past Medical History:  Diagnosis Date  . ADD (attention deficit disorder)   . Adenocarcinoma of duodenum ([place]) 06/16/2018  . Anxiety   . Family history of adverse reaction to anesthesia    mom goes into afib  . Wears glasses    driving    Past Surgical History:  Procedure Laterality Date  . BILIARY STENT PLACEMENT N/A 06/14/2018   Procedure: BILIARY STENT PLACEMENT;  Surgeon: Milus Banister, MD;  Location: [facility];  Service: Endoscopy;  Laterality: N/A;  . BIOPSY  06/14/2018   Procedure: BIOPSY;  Surgeon: Milus Banister, MD;  Location: [facility];  Service: Endoscopy;;  . ENDOSCOPIC RETROGRADE CHOLANGIOPANCREATOGRAPHY (ERCP) WITH PROPOFOL N/A 06/14/2018   Procedure: ENDOSCOPIC RETROGRADE  CHOLANGIOPANCREATOGRAPHY (ERCP) WITH PROPOFOL;  Surgeon: Milus Banister, MD;  Location: [facility];  Service: Endoscopy;  Laterality: N/A;  . ESOPHAGOGASTRODUODENOSCOPY N/A 06/14/2018   Procedure: ESOPHAGOGASTRODUODENOSCOPY (EGD);  Surgeon: Milus Banister, MD;  Location: [facility];  Service: Endoscopy;  Laterality: N/A;  . EUS N/A 06/14/2018   Procedure: UPPER ENDOSCOPIC ULTRASOUND (EUS) RADIAL;  Surgeon: Milus Banister, MD;  Location: [facility];  Service: Endoscopy;  Laterality: N/A;  . ORIF ANKLE FRACTURE Left 08/29/2013   Procedure: LEFT FIBULA -FRACTURE OPEN TREATMENT DISTAL FIBULAR (LATERALERAL MALLEOLUS) INCLUDES INTERNAL FIXATION, ;  Surgeon: [name], MD;  Location: [place];  Service: Orthopedics;  Laterality: Left;  . PERINEAL LACERATION REPAIR     post childbirth  . SPHINCTEROTOMY  06/14/2018   Procedure: SPHINCTEROTOMY;  Surgeon: Milus Banister, MD;  Location: [facility];  Service: Endoscopy;;  . TIBIA IM NAIL INSERTION Left 08/29/2013   Procedure: FRACTURE TREATMENT TIBIAL SHAFT BY INTRAMEDULLARY IMPLANT WITH SCREWS ;  Surgeon: [name], MD;  Location: [facility];  Service: Orthopedics;  Laterality: Left;    Prior to Admission medications   Medication Sig Start Date End Date Taking? Authorizing Provider  ALPRAZolam Duanne Moron) 0.5 MG tablet Take 1 tablet (0.5 mg total) by mouth 3 (three) times daily as needed for sleep or anxiety. 06/14/18 06/14/19 Yes Milus Banister, MD  amphetamine-dextroamphetamine (ADDERALL) 30 MG tablet Take 30 mg by mouth 2 (two) times daily.   Yes [provider]  anti-nausea (EMETROL) solution  Take 30 mLs by mouth every 15 (fifteen) minutes as needed for nausea or vomiting.   Yes [provider]  diphenhydrAMINE (BENADRYL) 50 MG capsule Take 50 mg by mouth every 6 (six) hours as needed for itching.   Yes [provider]  HYDROcodone-acetaminophen (NORCO/VICODIN) 5-325 MG  tablet Take 1-2 tablets by mouth every 6 (six) hours as needed for moderate pain. 06/15/18  Yes Milus Banister, MD  hydrOXYzine (ATARAX/VISTARIL) 25 MG tablet Take 1-2 tablets by mouth every 6 hours as needed for itching. Patient taking differently: Take 25-50 mg by mouth every 6 (six) hours as needed for itching.  06/06/18  Yes Esterwood, Amy S, PA-C  ibuprofen (ADVIL,MOTRIN) 200 MG tablet Take 800 mg by mouth every 6 (six) hours as needed for moderate pain.   Yes [provider]  ketorolac (TORADOL) 10 MG tablet Take 1 tablet by mouth every 6-8 hours as needed. Patient taking differently: Take 10 mg by mouth every 6 (six) hours as needed for moderate pain.  06/06/18  Yes Esterwood, Amy S, PA-C  levonorgestrel (MIRENA) 20 MCG/24HR IUD 1 each by Intrauterine route once.   Yes [provider]  magic mouthwash SOLN Take 5 mLs by mouth 4 (four) times daily as needed for mouth pain.   Yes [provider]  nystatin-triamcinolone ointment (MYCOLOG) Apply 1 application topically 2 (two) times daily as needed (for itching).    Yes [provider]  ondansetron (ZOFRAN) 4 MG tablet Take 1 tablet every 6 hours as needed for nausea. Patient taking differently: Take 4 mg by mouth every 6 (six) hours as needed for nausea or vomiting.  06/06/18  Yes Esterwood, Amy S, PA-C    Current Facility-Administered Medications  Medication Dose Route Frequency Provider Last Rate Last Dose  . potassium chloride 10 mEq in 100 mL IVPB  10 mEq Intravenous Q1 Hr x 3 Lorin Glass, PA-C 100 mL/hr at 06/16/18 1745 10 mEq at 06/16/18 1745  . sodium chloride (PF) 0.9 % injection            Current Outpatient Medications  Medication Sig Dispense Refill  . ALPRAZolam (XANAX) 0.5 MG tablet Take 1 tablet (0.5 mg total) by mouth 3 (three) times daily as needed for sleep or anxiety. 50 tablet 0  . amphetamine-dextroamphetamine (ADDERALL) 30 MG tablet Take 30 mg by mouth 2 (two) times daily.    Marland Kitchen  anti-nausea (EMETROL) solution Take 30 mLs by mouth every 15 (fifteen) minutes as needed for nausea or vomiting.    . diphenhydrAMINE (BENADRYL) 50 MG capsule Take 50 mg by mouth every 6 (six) hours as needed for itching.    Marland Kitchen HYDROcodone-acetaminophen (NORCO/VICODIN) 5-325 MG tablet Take 1-2 tablets by mouth every 6 (six) hours as needed for moderate pain. 50 tablet 0  . hydrOXYzine (ATARAX/VISTARIL) 25 MG tablet Take 1-2 tablets by mouth every 6 hours as needed for itching. (Patient taking differently: Take 25-50 mg by mouth every 6 (six) hours as needed for itching. ) 60 tablet 2  . ibuprofen (ADVIL,MOTRIN) 200 MG tablet Take 800 mg by mouth every 6 (six) hours as needed for moderate pain.    Marland Kitchen ketorolac (TORADOL) 10 MG tablet Take 1 tablet by mouth every 6-8 hours as needed. (Patient taking differently: Take 10 mg by mouth every 6 (six) hours as needed for moderate pain. ) 30 tablet 1  . levonorgestrel (MIRENA) 20 MCG/24HR IUD 1 each by Intrauterine route once.    . magic mouthwash SOLN  Take 5 mLs by mouth 4 (four) times daily as needed for mouth pain.    Marland Kitchen nystatin-triamcinolone ointment (MYCOLOG) Apply 1 application topically 2 (two) times daily as needed (for itching).     . ondansetron (ZOFRAN) 4 MG tablet Take 1 tablet every 6 hours as needed for nausea. (Patient taking differently: Take 4 mg by mouth every 6 (six) hours as needed for nausea or vomiting. ) 40 tablet 2    Allergies as of 06/16/2018 - Review Complete 06/16/2018  Allergen Reaction Noted  . Oxycodone Itching 08/28/2013    Family History  Problem Relation Age of Onset  . Heart disease Mother   . Heart disease Father     Social History   Socioeconomic History  . Marital status: Single  Occupational History  . Not on file  Tobacco Use  . Smoking status: Current Some Day Smoker  . Smokeless tobacco: Never Used  Substance and Sexual Activity  . Alcohol use: Yes    Comment: occ  . Drug use: No  . Sexual activity:  Not Currently    Comment: a pk/week   2 children age 21 and 76, both boys  Review of Systems: As per HPI, otherwise negative  Physical Exam: Vital signs in last 24 hours: Temp:  [97.7 F (36.5 C)-98.9 F (37.2 C)] 98.9 F (37.2 C) (02/01 1747) Pulse Rate:  [79-87] 79 (02/01 1747) Resp:  [14-18] 14 (02/01 1747) BP: (117-147)/(68-75) 117/68 (02/01 1747) SpO2:  [98 %-100 %] 98 % (02/01 1747)   Gen: awake, alert, obviously uncomfortable shifting and moving in bed, sitting up and then lying down repeatedly HEENT: icteric, op clear CV: RRR, no mrg Pulm: CTA b/l Abd: soft, diffusely tender in the upper abdomen without rebound or guarding/ND, +BS throughout Ext: no c/c/e Neuro: nonfocal Skin: Jaundiced   Intake/Output from previous day: No intake/output data recorded. Intake/Output this shift: No intake/output data recorded.  Lab Results: Recent Labs    06/14/18 0930 06/16/18 1549  WBC 6.3 10.6*  HGB 11.2* 10.8*  HCT 34.4* 33.8*  PLT 394 382   BMET Recent Labs    06/14/18 0930 06/16/18 1549  NA 137 136  K 3.7 3.0*  CL 106 101  CO2 23 25  GLUCOSE 113* 107*  BUN 11 13  CREATININE 0.61 0.69  CALCIUM 9.3 8.9   LFT Recent Labs    06/16/18 1549  PROT 7.6  ALBUMIN 4.0  AST 118*  ALT 322*  ALKPHOS 278*  BILITOT 2.8*   Lipase     Component Value Date/Time   LIPASE 348 (H) 06/16/2018 1549    Studies/Results: Ct Abdomen Pelvis W Contrast  Result Date: 06/16/2018 CLINICAL DATA:  Per patient ECG Thursday recent diagnosis pancreatic mass and colon caMid-Severe lower abdomen pain nauseaNo prior surgeriesPain since procedure, no fever. EXAM: CT ABDOMEN AND PELVIS WITH CONTRAST TECHNIQUE: Multidetector CT imaging of the abdomen and pelvis was performed using the standard protocol following bolus administration of intravenous contrast. CONTRAST:  115mL ISOVUE-300 IOPAMIDOL (ISOVUE-300) INJECTION 61% COMPARISON:  None. FINDINGS: Lower chest: Multiple lung base  nodules consistent with metastatic disease. Two largest on the left, 6 mm nodule, subpleural lateral left lower lobe, image 15, series 4 and 7 mm nodule, posteromedial left lower lobe, image 27. Largest on the right, 6 mm posterior inferior lower lobe nodule, image 23. No acute findings at the lung bases. Hepatobiliary: Liver normal in size and attenuation. No masses or focal lesions. There is intrahepatic biliary air. Gallbladder is mildly  distended. It contains dense fluid had at least 1 discrete stone. No wall thickening or adjacent inflammation. There is air within the common bile duct. A distal common bile duct stent extends across the pancreatic head to the second portion of the duodenum. Pancreas: There is fullness of the pancreatic head without a defined mass. Pancreatic duct is dilated to a maximum of 5 mm. There is hazy inflammatory change in the peripancreatic fat tracking along the gastrohepatic ligament. Multiple peripancreatic subcentimeter lymph nodes are noted. Spleen: Normal in size without focal abnormality. Adrenals/Urinary Tract: No adrenal masses. No renal masses. 3-4 mm nonobstructing stone in the upper pole the right kidney. No other intrarenal stones. No hydronephrosis. Ureters are normal in course and in caliber. Bladder is unremarkable. Stomach/Bowel: Mild wall thickening of the duodenum adjacent to the pancreatic head, presumed reactive. Stomach is unremarkable. Small bowel is normal in caliber with no wall thickening or inflammation. Colon is normal in caliber with no wall thickening or inflammation. No CT evidence of a colonic mass. Normal appendix visualized. Vascular/Lymphatic: There are prominent retroperitoneal lymph nodes all subcentimeter short axis. Portal vein, superior mesenteric vein and splenic vein are patent. No aortic atherosclerosis. Reproductive: Well-positioned IUD. Uterus and adnexa otherwise unremarkable. Other: No abdominal wall hernia.  No ascites. Musculoskeletal:  No fracture or acute finding. No osteoblastic or osteolytic lesions. Disc degenerative changes at L2-L3. IMPRESSION: 1. There are peripancreatic inflammatory changes consistent with pancreatitis. No discrete fluid collection is seen to suggest a pseudocyst. There is no venous thrombosis and no evidence of pancreatic necrosis. No other evidence of an acute abnormality. 2. Fullness of the pancreatic head. No defined mass. Pancreatic duct is dilated to 5 mm. 3. Multiple subcentimeter peripancreatic lymph nodes. Given the history of pancreatic carcinoma this is suspicious for metastatic adenopathy. 4. Well-positioned distal common bile duct stent. There is intrahepatic biliary and common bile duct air. 5. No visualized colonic mass. 6. Multiple lung base nodules consistent with metastatic disease. 7. Nonobstructing stone in the upper pole of the right kidney. Electronically Signed   By: Lajean Manes M.D.   On: 06/16/2018 17:13    IMPRESSION:  48 y.o. female with a recent diagnosis of duodenal adenocarcinoma causing upper abdominal pain and weight loss and biliary obstruction returning with upper abdominal pain most consistent with post-ERCP pancreatitis.  PLAN: 1. Post-ERCP pancreatitis --bowel rest, aggressive hydration, pain control, antiemetics as needed.  We will follow closely. --pain not adequately controlled in the ER, despite morphine and dilaudid.   --Will need to increase pain meds so that pain is controlled. --Aggressive IV hydration, esp in 1st 24 hours  2.  Duodenal adenocarcinoma --recently biopsied and biopsies consistent with adenocarcinoma.  Oncology consultation is pending as the diagnosis was very recently made.  Nonobstructing, but causing luminal narrowing.  The side-viewing endoscope was able to traverse the mass. --She was unaware of the pathology results as they have just returned.  We discussed the diagnosis at length of adenocarcinoma felt to be arising from the small bowel.  This  is at least locally advanced and likely stage III but cannot exclude stage IV with pulmonary nodule.  We discussed this in a very straightforward fashion, she was appreciative of the information.  Her mother also had questions. --We will move to have her referred to oncology ASAP to discuss treatment options  3.  Biliary obstruction --improving liver enzymes after CBD stent placement.  The stent appears to be in good position and liver enzymes are improving indicating  good biliary decompression.   Lajuan Lines Aranda Bihm  06/16/2018, 6:33 PM

## 2018-06-16 NOTE — H&P (Signed)
consult   Hailey Houston:[id number] DOB: 10-28-1970 DOA: 06/16/2018     PCP: Patient, No Pcp Per   Outpatient Specialists:     GI Dr. Christella Hartigan (  [facility])    Patient arrived to ER on 06/16/18 at 1514  Patient coming from: home Lives   With family    Chief Complaint:  Chief Complaint  Patient presents with  . Abdominal Pain    HPI: Hailey Houston is a 48 y.o. 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 There was some concern for metastatic spread with pulmonary nodules and porta hepatis gastrohepatic area and retroperitoneal lymphadenopathy.  On 30 January patient undergone ERCP with ultrasound with propofol. No Pancreatic mass was found but  duodenum severely abnormal which was actually causing a distal CBD stricture she had a limited biliary sphincterectomy and balloon dilatation biopsies were obtained biliary stent placed Since procedure patient has had significant abdominal pain a try to use some Advil she also has tried to use Xanax for muscle relaxation and anxiety.  She called in office reporting severe pain was prescribed Vicodin but it did not seem to even touch her pain. The point she presented to emergency department Pain has became severe and unbearable she have had some nausea and unable to tolerate p.o. medications.  Or she could eat is about 3 spoonfuls of broth. No true vomiting just spitting up.   No associated diarrhea constipation no fevers  Output, some mild chills. Denies light headedness. Reports chest pain  associated with abdominal pain No Shortness of breath.       While in ER: [facility] has been consulted will see patient in ER Was given morphine and Dilaudid for pain Repeat CT was obtained  The following Work up has been ordered so far:  Orders Placed This Encounter  Procedures  . CT Abdomen Pelvis W Contrast  . CBC with Differential  . Comprehensive metabolic panel  . Lipase, blood  . Protime-INR  . Diet NPO time specified  . Consult to gastroenterology  . Consult to gastroenterology  . Consult to hospitalist  . I-Stat beta hCG blood, ED  . ED EKG  . EKG 12-Lead  . Saline lock IV     Following Medications were ordered in ER: Medications  sodium chloride (PF) 0.9 % injection (has no administration in time range)  potassium chloride 10 mEq in 100 mL IVPB (10 mEq Intravenous New Bag/Given 06/16/18 1745)  HYDROmorphone (DILAUDID) injection 1 mg (has no administration in time range)  sodium chloride 0.9 % bolus 1,000 mL (0 mLs Intravenous Stopped 06/16/18 1728)  ondansetron (ZOFRAN) injection 4 mg (4 mg Intravenous Given 06/16/18 1601)  iopamidol (ISOVUE-300) 61 % injection (100 mLs  Contrast Given 06/16/18 1640)  HYDROmorphone (DILAUDID) injection 0.5 mg (0.5 mg Intravenous Given 06/16/18 1635)  HYDROmorphone (DILAUDID) injection 1 mg (1 mg Intravenous Given 06/16/18 1744)    Significant initial  Findings: Abnormal Labs Reviewed  CBC WITH DIFFERENTIAL/PLATELET - Abnormal; Notable for the following components:      Result Value   WBC 10.6 (*)  RBC 3.43 (*)    Hemoglobin 10.8 (*)    HCT 33.8 (*)    Monocytes Absolute 1.2 (*)    All other components within normal limits  COMPREHENSIVE METABOLIC PANEL - Abnormal; Notable for the following components:   Potassium 3.0 (*)    Glucose, Bld 107 (*)    AST 118 (*)    ALT 322 (*)    Alkaline Phosphatase 278 (*)    Total Bilirubin 2.8 (*)    All other components within normal limits  LIPASE, BLOOD - Abnormal; Notable for the following  components:   Lipase 348 (*)    All other components within normal limits    Lactic Acid, Venous No results found for: LATICACIDVEN  Na 136 K 3.0  Cr   stable,    Lab Results  Component Value Date   CREATININE 0.69 06/16/2018   CREATININE 0.61 06/14/2018    lipase 348  WBC  10.6 HG/HCT   Down  from baseline see below    Component Value Date/Time   HGB 10.8 (L) 06/16/2018 1549   HCT 33.8 (L) 06/16/2018 1549      UA  not ordered   CTabd/pelvis -  Pancreatitis, multiple lung nodules  ECG:  Personally reviewed by me showing: HR : 85 Rhythm: NSR,  no evidence of ischemic changes QTC 450     ED Triage Vitals  Enc Vitals Group     BP 06/16/18 1523 (!) 147/75     Pulse Rate 06/16/18 1523 87     Resp 06/16/18 1523 18     Temp 06/16/18 1523 97.7 F (36.5 C)     Temp Source 06/16/18 1523 Oral     SpO2 06/16/18 1523 100 %     Weight --      Height --      Head Circumference --      Peak Flow --      Pain Score 06/16/18 1525 10     Pain Loc --      Pain Edu? --      Excl. in GC? --   TMAX(24)@       Latest  Blood pressure 117/68, pulse 79, temperature 98.9 F (37.2 C), temperature source Oral, resp. rate 14, SpO2 98 %. ER MD has discussed pt.  with Dr. Rhea Belton who will see patient in   Hospitalist was called for admission for Pancreatitis post ERCP   Review of Systems:    Pertinent positives include:  abdominal pain, nausea, vomiting,   Constitutional:  No weight loss, night sweats, Fevers, chills, fatigue, weight loss  HEENT:  No headaches, Difficulty swallowing,Tooth/dental problems,Sore throat,  No sneezing, itching, ear ache, nasal congestion, post nasal drip,  Cardio-vascular:  No chest pain, Orthopnea, PND, anasarca, dizziness, palpitations.no Bilateral lower extremity swelling  GI:  No heartburn, indigestion,diarrhea, change in bowel habits, loss of appetite, melena, blood in stool, hematemesis Resp:  no shortness of breath at rest. No dyspnea on  exertion, No excess mucus, no productive cough, No non-productive cough, No coughing up of blood.No change in color of mucus.No wheezing. Skin:  no rash or lesions. No jaundice GU:  no dysuria, change in color of urine, no urgency or frequency. No straining to urinate.  No flank pain.  Musculoskeletal:  No joint pain or no joint swelling. No decreased range of motion. No back pain.  Psych:  No change in mood or affect. No depression or anxiety. No memory loss.  Neuro: no localizing neurological complaints,  no tingling, no weakness, no double vision, no gait abnormality, no slurred speech, no confusion  All systems reviewed and apart from HOPI all are negative  Past Medical History:   Past Medical History:  Diagnosis Date  . ADD (attention deficit disorder)   . Adenocarcinoma of duodenum (HCC) 06/16/2018  . Anxiety   . Family history of adverse reaction to anesthesia    mom goes into afib  . Wears glasses    driving      Past Surgical History:  Procedure Laterality Date  . BILIARY STENT PLACEMENT N/A 06/14/2018   Procedure: BILIARY STENT PLACEMENT;  Surgeon: Rachael Fee, MD;  Location: [facility];  Service: Endoscopy;  Laterality: N/A;  . BIOPSY  06/14/2018   Procedure: BIOPSY;  Surgeon: Rachael Fee, MD;  Location: [facility];  Service: Endoscopy;;  . ENDOSCOPIC RETROGRADE CHOLANGIOPANCREATOGRAPHY (ERCP) WITH PROPOFOL N/A 06/14/2018   Procedure: ENDOSCOPIC RETROGRADE CHOLANGIOPANCREATOGRAPHY (ERCP) WITH PROPOFOL;  Surgeon: Rachael Fee, MD;  Location: [facility];  Service: Endoscopy;  Laterality: N/A;  . ESOPHAGOGASTRODUODENOSCOPY N/A 06/14/2018   Procedure: ESOPHAGOGASTRODUODENOSCOPY (EGD);  Surgeon: Rachael Fee, MD;  Location: [facility];  Service: Endoscopy;  Laterality: N/A;  . EUS N/A 06/14/2018   Procedure: UPPER ENDOSCOPIC ULTRASOUND (EUS) RADIAL;  Surgeon: Rachael Fee, MD;  Location: [facility];  Service: Endoscopy;  Laterality: N/A;  . ORIF  ANKLE FRACTURE Left 08/29/2013   Procedure: LEFT FIBULA -FRACTURE OPEN TREATMENT DISTAL FIBULAR (LATERALERAL MALLEOLUS) INCLUDES INTERNAL FIXATION, ;  Surgeon: Sheral Apley, MD;  Location: [facility];  Service: Orthopedics;  Laterality: Left;  . PERINEAL LACERATION REPAIR     post childbirth  . SPHINCTEROTOMY  06/14/2018   Procedure: SPHINCTEROTOMY;  Surgeon: Rachael Fee, MD;  Location: [facility];  Service: Endoscopy;;  . TIBIA IM NAIL INSERTION Left 08/29/2013   Procedure: FRACTURE TREATMENT TIBIAL SHAFT BY INTRAMEDULLARY IMPLANT WITH SCREWS ;  Surgeon: Sheral Apley, MD;  Location: [facility];  Service: Orthopedics;  Laterality: Left;    Social History:  Ambulatory independently     reports that she has been smoking. She has never used smokeless tobacco. She reports current alcohol use. She reports that she does not use drugs.     Family History:   Family History  Problem Relation Age of Onset  . Heart disease Mother   . Heart disease Father     Allergies: Allergies  Allergen Reactions  . Oxycodone Itching     Prior to Admission medications   Medication Sig Start Date End Date Taking? Authorizing Provider  ALPRAZolam Prudy Feeler) 0.5 MG tablet Take 1 tablet (0.5 mg total) by mouth 3 (three) times daily as needed for sleep or anxiety. 06/14/18 06/14/19 Yes Rachael Fee, MD  amphetamine-dextroamphetamine (ADDERALL) 30 MG tablet Take 30 mg by mouth 2 (two) times daily.   Yes [provider]  anti-nausea (EMETROL) solution Take 30 mLs by mouth every 15 (fifteen) minutes as needed for nausea or vomiting.   Yes [provider]  diphenhydrAMINE (BENADRYL) 50 MG capsule Take 50 mg by mouth every 6 (six) hours as needed for itching.   Yes [provider]  HYDROcodone-acetaminophen (NORCO/VICODIN) 5-325 MG tablet Take 1-2 tablets by mouth every 6 (six) hours as needed for moderate pain. 06/15/18  Yes Rachael Fee, MD    hydrOXYzine (ATARAX/VISTARIL) 25 MG tablet Take 1-2 tablets by mouth every 6 hours as needed for itching. Patient taking differently: Take 25-50 mg by mouth  every 6 (six) hours as needed for itching.  06/06/18  Yes Esterwood, Amy S, PA-C  ibuprofen (ADVIL,MOTRIN) 200 MG tablet Take 800 mg by mouth every 6 (six) hours as needed for moderate pain.   Yes [provider]  ketorolac (TORADOL) 10 MG tablet Take 1 tablet by mouth every 6-8 hours as needed. Patient taking differently: Take 10 mg by mouth every 6 (six) hours as needed for moderate pain.  06/06/18  Yes Esterwood, Amy S, PA-C  levonorgestrel (MIRENA) 20 MCG/24HR IUD 1 each by Intrauterine route once.   Yes [provider]  magic mouthwash SOLN Take 5 mLs by mouth 4 (four) times daily as needed for mouth pain.   Yes [provider]  nystatin-triamcinolone ointment (MYCOLOG) Apply 1 application topically 2 (two) times daily as needed (for itching).    Yes [provider]  ondansetron (ZOFRAN) 4 MG tablet Take 1 tablet every 6 hours as needed for nausea. Patient taking differently: Take 4 mg by mouth every 6 (six) hours as needed for nausea or vomiting.  06/06/18  Yes Esterwood, Amy S, PA-C   Physical Exam: Blood pressure 117/68, pulse 79, temperature 98.9 F (37.2 C), temperature source Oral, resp. rate 14, SpO2 98 %. 1. General:  in  acute distress complaining of severe pain    Chronically ill -appearing 2. Psychological: Alert and   Oriented 3. Head/ENT:   Dry Mucous Membranes                          Head Non traumatic, neck supple                           Poor Dentition 4. SKIN:   decreased Skin turgor,  Skin clean Dry and intact no rash 5. Heart: Regular rate and rhythm no Murmur, no Rub or gallop 6. Lungs:  no wheezes or crackles   7. Abdomen: Soft,  tender, Non distended   Obese bowel sounds present 8. Lower extremities: no clubbing, cyanosis, no  edema 9. Neurologically Grossly intact, moving  all 4 extremities equally   10. MSK: Normal range of motion   LABS:     Recent Labs  Lab 06/14/18 0930 06/16/18 1549  WBC 6.3 10.6*  NEUTROABS  --  7.7  HGB 11.2* 10.8*  HCT 34.4* 33.8*  MCV 95.8 98.5  PLT 394 382   Basic Metabolic Panel: Recent Labs  Lab 06/14/18 0930 06/16/18 1549  NA 137 136  K 3.7 3.0*  CL 106 101  CO2 23 25  GLUCOSE 113* 107*  BUN 11 13  CREATININE 0.61 0.69  CALCIUM 9.3 8.9      Recent Labs  Lab 06/14/18 0930 06/16/18 1549  AST 282* 118*  ALT 439* 322*  ALKPHOS 341* 278*  BILITOT 6.9* 2.8*  PROT 7.6 7.6  ALBUMIN 4.0 4.0   Recent Labs  Lab 06/16/18 1549  LIPASE 348*   No results for input(s): AMMONIA in the last 168 hours.    HbA1C: No results for input(s): HGBA1C in the last 72 hours. CBG: No results for input(s): GLUCAP in the last 168 hours.    Urine analysis: No results found for: COLORURINE, APPEARANCEUR, LABSPEC, PHURINE, GLUCOSEU, HGBUR, BILIRUBINUR, KETONESUR, PROTEINUR, UROBILINOGEN, NITRITE, LEUKOCYTESUR     Cultures: No results found for: SDES, SPECREQUEST, CULT, REPTSTATUS   Radiological Exams on Admission: Ct Abdomen Pelvis W Contrast  Result Date: 06/16/2018 CLINICAL DATA:  Per patient ECG Thursday recent diagnosis pancreatic mass and colon caMid-Severe lower abdomen pain nauseaNo prior surgeriesPain since procedure, no fever. EXAM: CT ABDOMEN AND PELVIS WITH CONTRAST TECHNIQUE: Multidetector CT imaging of the abdomen and pelvis was performed using the standard protocol following bolus administration of intravenous contrast. CONTRAST:  ISOVUE-300 IOPAMIDOL (ISOVUE-300) INJECTION 61% COMPARISON:  None. FINDINGS: Lower chest: Multiple lung base nodules consistent with metastatic disease. Two largest on the left, 6 mm nodule, subpleural lateral left lower lobe, image 15, series 4 and 7 mm nodule, posteromedial left lower lobe, image 27. Largest on the right, 6 mm posterior inferior lower lobe nodule, image 23.  No acute findings at the lung bases. Hepatobiliary: Liver normal in size and attenuation. No masses or focal lesions. There is intrahepatic biliary air. Gallbladder is mildly distended. It contains dense fluid had at least 1 discrete stone. No wall thickening or adjacent inflammation. There is air within the common bile duct. A distal common bile duct stent extends across the pancreatic head to the second portion of the duodenum. Pancreas: There is fullness of the pancreatic head without a defined mass. Pancreatic duct is dilated to a maximum of 5 mm. There is hazy inflammatory change in the peripancreatic fat tracking along the gastrohepatic ligament. Multiple peripancreatic subcentimeter lymph nodes are noted. Spleen: Normal in size without focal abnormality. Adrenals/Urinary Tract: No adrenal masses. No renal masses. 3-4 mm nonobstructing stone in the upper pole the right kidney. No other intrarenal stones. No hydronephrosis. Ureters are normal in course and in caliber. Bladder is unremarkable. Stomach/Bowel: Mild wall thickening of the duodenum adjacent to the pancreatic head, presumed reactive. Stomach is unremarkable. Small bowel is normal in caliber with no wall thickening or inflammation. Colon is normal in caliber with no wall thickening or inflammation. No CT evidence of a colonic mass. Normal appendix visualized. Vascular/Lymphatic: There are prominent retroperitoneal lymph nodes all subcentimeter short axis. Portal vein, superior mesenteric vein and splenic vein are patent. No aortic atherosclerosis. Reproductive: Well-positioned IUD. Uterus and adnexa otherwise unremarkable. Other: No abdominal wall hernia.  No ascites. Musculoskeletal: No fracture or acute finding. No osteoblastic or osteolytic lesions. Disc degenerative changes at L2-L3. IMPRESSION: 1. There are peripancreatic inflammatory changes consistent with pancreatitis. No discrete fluid collection is seen to suggest a pseudocyst. There is no  venous thrombosis and no evidence of pancreatic necrosis. No other evidence of an acute abnormality. 2. Fullness of the pancreatic head. No defined mass. Pancreatic duct is dilated to 5 mm. 3. Multiple subcentimeter peripancreatic lymph nodes. Given the history of pancreatic carcinoma this is suspicious for metastatic adenopathy. 4. Well-positioned distal common bile duct stent. There is intrahepatic biliary and common bile duct air. 5. No visualized colonic mass. 6. Multiple lung base nodules consistent with metastatic disease. 7. Nonobstructing stone in the upper pole of the right kidney. Electronically Signed   By: Amie Portland M.D.   On: 06/16/2018 17:13    Chart has been reviewed    Assessment/Plan  48 y.o. female with medical history significant of recent diagnosis of adenocarcinoma of duodenum sp recent ERCP  Admitted for pancreatitis   Present on Admission: . Adenocarcinoma of duodenum (HCC) - will need to be set up with oncology follow up to discuss further plan of care. Spoke with Dr. Truett Perna who will help arrange for follow up, appreciate their help. . Pancreatitis - in the setting of recent ERCP, keep NPO, pain control, IVF rehydration, appreciate GI consult  . Hypokalemia - will replace  and check magnesium level . Dehydration - will rehydrate and follow fluid status . Nausea and vomiting - supportive managment  pulmonary metastasis - will obtain CXR hold off on CT chest given recent contrast bolus   Other plan as per orders.  DVT prophylaxis:  SCD    Code Status:  FULL CODE  as per patient   I had personally discussed CODE STATUS with patient       Family Communication:   Family  at  Bedside  plan of care was discussed with  mother  Disposition Plan:   To home once workup is complete and patient is stable                                       Consults called: [facility] will follow, Dr. Truett Perna with Oncology  is aware of admission  Admission status:   inpatient      Expect 2 midnight stay secondary to severity of patient's current illness including     Severe lab/radiological/exam abnormalities including:  pancreatitis   and extensive comorbidities including:   Malignancy,  That are currently affecting medical management.   I expect  patient to be hospitalized for 2 midnights requiring inpatient medical care.  Patient is at high risk for adverse outcome (such as loss of life or disability) if not treated.  Indication for inpatient stay as follows:    severe pain requiring acute inpatient management,  inability to maintain oral hydration       Need for IV fluids,  IV pain medications     Level of care     tele  For 12H      Keyly Baldonado 06/16/2018, 7:45 PM    [facility]     after 2 AM please page floor coverage PA If 7AM-7PM, please contact the day team taking care of the patient using [email or web address]

## 2018-06-16 NOTE — ED Provider Notes (Signed)
[facility] DEPT Provider Note   CSN: [id number] Arrival date & time: 06/16/18  1514     History   Chief Complaint Chief Complaint  Patient presents with  . Abdominal Pain    HPI Hailey Houston is a 48 y.o. female with a past medical history of ADHD, who presents today for evaluation of epigastric pain.  On 06/14/2018 she had a ERCP with biopsy, sphincterotomy, biliary stent placement, and biliary duct dilation, with Dr. Ardis Hughs for a mass.  According to notes from GI NP patient did not have a discrete mass of the pancreas found but had severely abnormal duodenum causing a distal CBD stricture, biopsies have reportedly showed this is adenocarcinoma.  She reports that her pain has been present since the procedure.  She reports that it has been worsening until today when it became unbearable.  She has been trying her home p.o. Vicodin without significant relief.  She has been unable to eat or drink, last p.o. intake was approximately 3 spoonfuls of broth at 1 PM.  She reports nausea without vomiting.  The pain radiates throughout her entire midline.  She denies any diarrhea or constipation.    HPI  Past Medical History:  Diagnosis Date  . ADD (attention deficit disorder)   . 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 Anxiety   . Family history of adverse reaction to anesthesia    mom goes into afib  . Wears glasses    driving    Patient Active Problem List   Diagnosis Date Noted  . 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 Abnormal CT scan   . Common bile duct (CBD) stricture   . Dilation of biliary tract   . Duodenal mass   . Attention deficit hyperactivity disorder (ADHD) 05/27/2018  . Tibia fracture 08/29/2013    Past Surgical History:  Procedure Laterality Date  . BILIARY STENT PLACEMENT N/A 06/14/2018   Procedure: BILIARY STENT PLACEMENT;  Surgeon: Milus Banister, MD;  Location: [facility];  Service: Endoscopy;  Laterality: N/A;    . BIOPSY  06/14/2018   Procedure: BIOPSY;  Surgeon: Milus Banister, MD;  Location: [facility];  Service: Endoscopy;;  . ENDOSCOPIC RETROGRADE CHOLANGIOPANCREATOGRAPHY (ERCP) WITH PROPOFOL N/A 06/14/2018   Procedure: ENDOSCOPIC RETROGRADE CHOLANGIOPANCREATOGRAPHY (ERCP) WITH PROPOFOL;  Surgeon: Milus Banister, MD;  Location: [facility];  Service: Endoscopy;  Laterality: N/A;  . ESOPHAGOGASTRODUODENOSCOPY N/A 06/14/2018   Procedure: ESOPHAGOGASTRODUODENOSCOPY (EGD);  Surgeon: Milus Banister, MD;  Location: [facility];  Service: Endoscopy;  Laterality: N/A;  . EUS N/A 06/14/2018   Procedure: UPPER ENDOSCOPIC ULTRASOUND (EUS) RADIAL;  Surgeon: Milus Banister, MD;  Location: [facility];  Service: Endoscopy;  Laterality: N/A;  . ORIF ANKLE FRACTURE Left 08/29/2013   Procedure: LEFT FIBULA -FRACTURE OPEN TREATMENT DISTAL FIBULAR (LATERALERAL MALLEOLUS) INCLUDES INTERNAL FIXATION, ;  Surgeon: [name], MD;  Location: [facility];  Service: Orthopedics;  Laterality: Left;  . PERINEAL LACERATION REPAIR     post childbirth  . SPHINCTEROTOMY  06/14/2018   Procedure: SPHINCTEROTOMY;  Surgeon: Milus Banister, MD;  Location: [facility];  Service: Endoscopy;;  . TIBIA IM NAIL INSERTION Left 08/29/2013   Procedure: FRACTURE TREATMENT TIBIAL SHAFT BY INTRAMEDULLARY IMPLANT WITH SCREWS ;  Surgeon: [name], MD;  Location: [facility];  Service: Orthopedics;  Laterality: Left;     OB History   No obstetric history on file.      Home Medications  Prior to Admission medications   Medication Sig Start Date End Date Taking? Authorizing Provider  ALPRAZolam Duanne Moron) 0.5 MG tablet Take 1 tablet (0.5 mg total) by mouth 3 (three) times daily as needed for sleep or anxiety. 06/14/18 06/14/19 Yes Milus Banister, MD  amphetamine-dextroamphetamine (ADDERALL) 30 MG tablet Take 30 mg by mouth 2 (two) times daily.   Yes [provider]  anti-nausea  (EMETROL) solution Take 30 mLs by mouth every 15 (fifteen) minutes as needed for nausea or vomiting.   Yes [provider]  diphenhydrAMINE (BENADRYL) 50 MG capsule Take 50 mg by mouth every 6 (six) hours as needed for itching.   Yes [provider]  HYDROcodone-acetaminophen (NORCO/VICODIN) 5-325 MG tablet Take 1-2 tablets by mouth every 6 (six) hours as needed for moderate pain. 06/15/18  Yes Milus Banister, MD  hydrOXYzine (ATARAX/VISTARIL) 25 MG tablet Take 1-2 tablets by mouth every 6 hours as needed for itching. Patient taking differently: Take 25-50 mg by mouth every 6 (six) hours as needed for itching.  06/06/18  Yes Esterwood, Amy S, PA-C  ibuprofen (ADVIL,MOTRIN) 200 MG tablet Take 800 mg by mouth every 6 (six) hours as needed for moderate pain.   Yes [provider]  ketorolac (TORADOL) 10 MG tablet Take 1 tablet by mouth every 6-8 hours as needed. Patient taking differently: Take 10 mg by mouth every 6 (six) hours as needed for moderate pain.  06/06/18  Yes Esterwood, Amy S, PA-C  levonorgestrel (MIRENA) 20 MCG/24HR IUD 1 each by Intrauterine route once.   Yes [provider]  magic mouthwash SOLN Take 5 mLs by mouth 4 (four) times daily as needed for mouth pain.   Yes [provider]  nystatin-triamcinolone ointment (MYCOLOG) Apply 1 application topically 2 (two) times daily as needed (for itching).    Yes [provider]  ondansetron (ZOFRAN) 4 MG tablet Take 1 tablet every 6 hours as needed for nausea. Patient taking differently: Take 4 mg by mouth every 6 (six) hours as needed for nausea or vomiting.  06/06/18  Yes Esterwood, Amy S, PA-C    Family History Family History  Problem Relation Age of Onset  . Heart disease Mother   . Heart disease Father     Social History Social History   Tobacco Use  . Smoking status: Current Some Day Smoker  . Smokeless tobacco: Never Used  Substance Use Topics  . Alcohol use: Yes     Comment: occ  . Drug use: No     Allergies   Oxycodone   Review of Systems Review of Systems  Constitutional: Positive for chills. Negative for fever.  HENT: Negative for congestion.   Eyes: Negative for visual disturbance.       Yellow sclera  Respiratory: Negative for chest tightness and shortness of breath.   Cardiovascular: Positive for chest pain. Negative for palpitations and leg swelling.  Gastrointestinal: Positive for abdominal pain and nausea. Negative for constipation, diarrhea and vomiting.       Itching  Genitourinary: Positive for decreased urine volume. Negative for dysuria.  Musculoskeletal: Negative for back pain and neck pain.  Skin: Negative for color change and rash.  Neurological: Negative for headaches.  All other systems reviewed and are negative.    Physical Exam Updated Vital Signs BP 117/68 (BP Location: Left Arm)   Pulse 79   Temp 98.9 F (37.2 C) (Oral)   Resp 14   SpO2 98%   Physical Exam Vitals signs and  nursing note reviewed.  Constitutional:      General: She is in acute distress.     Appearance: She is well-developed.     Comments: Appears uncomfortable  HENT:     Head: Normocephalic and atraumatic.     Mouth/Throat:     Mouth: Mucous membranes are moist.  Eyes:     General: Scleral icterus present.     Conjunctiva/sclera: Conjunctivae normal.  Neck:     Musculoskeletal: Neck supple.  Cardiovascular:     Rate and Rhythm: Normal rate and regular rhythm.     Heart sounds: Normal heart sounds. No murmur.  Pulmonary:     Effort: Pulmonary effort is normal. No respiratory distress.     Breath sounds: Normal breath sounds. No wheezing.  Abdominal:     General: Bowel sounds are normal. There is no distension.     Palpations: Abdomen is soft.     Tenderness: There is abdominal tenderness in the epigastric area. There is guarding. There is no rebound.     Hernia: No hernia is present.  Skin:    General: Skin is warm and dry.    Neurological:     General: No focal deficit present.     Mental Status: She is alert.  Psychiatric:        Mood and Affect: Mood normal.      ED Treatments / Results  Labs (all labs ordered are listed, but only abnormal results are displayed) Labs Reviewed  CBC WITH DIFFERENTIAL/PLATELET - Abnormal; Notable for the following components:      Result Value   WBC 10.6 (*)    RBC 3.43 (*)    Hemoglobin 10.8 (*)    HCT 33.8 (*)    Monocytes Absolute 1.2 (*)    All other components within normal limits  COMPREHENSIVE METABOLIC PANEL - Abnormal; Notable for the following components:   Potassium 3.0 (*)    Glucose, Bld 107 (*)    AST 118 (*)    ALT 322 (*)    Alkaline Phosphatase 278 (*)    Total Bilirubin 2.8 (*)    All other components within normal limits  LIPASE, BLOOD - Abnormal; Notable for the following components:   Lipase 348 (*)    All other components within normal limits  PROTIME-INR  I-STAT BETA HCG BLOOD, ED ([facility], [facility], [facility] ONLY)    EKG EKG Interpretation  Date/Time:  Saturday June 16 2018 15:51:59 EST Ventricular Rate:  86 PR Interval:    QRS Duration: 87 QT Interval:  376 QTC Calculation: 450 R Axis:   16 Text Interpretation:  Sinus rhythm Low voltage, precordial leads RSR' in V1 or V2, right VCD or RVH Baseline wander in lead(s) II III aVF No old tracing to compare Confirmed by Lacretia Leigh ([id number]) on 06/16/2018 4:23:17 PM   Radiology Ct Abdomen Pelvis W Contrast  Result Date: 06/16/2018 CLINICAL DATA:  Per patient ECG Thursday recent diagnosis pancreatic mass and colon caMid-Severe lower abdomen pain nauseaNo prior surgeriesPain since procedure, no fever. EXAM: CT ABDOMEN AND PELVIS WITH CONTRAST TECHNIQUE: Multidetector CT imaging of the abdomen and pelvis was performed using the standard protocol following bolus administration of intravenous contrast. CONTRAST:  138m ISOVUE-300 IOPAMIDOL (ISOVUE-300) INJECTION 61% COMPARISON:  None. FINDINGS: Lower  chest: Multiple lung base nodules consistent with metastatic disease. Two largest on the left, 6 mm nodule, subpleural lateral left lower lobe, image 15, series 4 and 7 mm nodule, posteromedial left lower lobe, image 27. Largest on the  right, 6 mm posterior inferior lower lobe nodule, image 23. No acute findings at the lung bases. Hepatobiliary: Liver normal in size and attenuation. No masses or focal lesions. There is intrahepatic biliary air. Gallbladder is mildly distended. It contains dense fluid had at least 1 discrete stone. No wall thickening or adjacent inflammation. There is air within the common bile duct. A distal common bile duct stent extends across the pancreatic head to the second portion of the duodenum. Pancreas: There is fullness of the pancreatic head without a defined mass. Pancreatic duct is dilated to a maximum of 5 mm. There is hazy inflammatory change in the peripancreatic fat tracking along the gastrohepatic ligament. Multiple peripancreatic subcentimeter lymph nodes are noted. Spleen: Normal in size without focal abnormality. Adrenals/Urinary Tract: No adrenal masses. No renal masses. 3-4 mm nonobstructing stone in the upper pole the right kidney. No other intrarenal stones. No hydronephrosis. Ureters are normal in course and in caliber. Bladder is unremarkable. Stomach/Bowel: Mild wall thickening of the duodenum adjacent to the pancreatic head, presumed reactive. Stomach is unremarkable. Small bowel is normal in caliber with no wall thickening or inflammation. Colon is normal in caliber with no wall thickening or inflammation. No CT evidence of a colonic mass. Normal appendix visualized. Vascular/Lymphatic: There are prominent retroperitoneal lymph nodes all subcentimeter short axis. Portal vein, superior mesenteric vein and splenic vein are patent. No aortic atherosclerosis. Reproductive: Well-positioned IUD. Uterus and adnexa otherwise unremarkable. Other: No abdominal wall hernia.  No  ascites. Musculoskeletal: No fracture or acute finding. No osteoblastic or osteolytic lesions. Disc degenerative changes at L2-L3. IMPRESSION: 1. There are peripancreatic inflammatory changes consistent with pancreatitis. No discrete fluid collection is seen to suggest a pseudocyst. There is no venous thrombosis and no evidence of pancreatic necrosis. No other evidence of an acute abnormality. 2. Fullness of the pancreatic head. No defined mass. Pancreatic duct is dilated to 5 mm. 3. Multiple subcentimeter peripancreatic lymph nodes. Given the history of pancreatic carcinoma this is suspicious for metastatic adenopathy. 4. Well-positioned distal common bile duct stent. There is intrahepatic biliary and common bile duct air. 5. No visualized colonic mass. 6. Multiple lung base nodules consistent with metastatic disease. 7. Nonobstructing stone in the upper pole of the right kidney. Electronically Signed   By: Lajean Manes M.D.   On: 06/16/2018 17:13    Procedures Procedures (including critical care time)  Medications Ordered in ED Medications  sodium chloride (PF) 0.9 % injection (has no administration in time range)  potassium chloride 10 mEq in 100 mL IVPB (10 mEq Intravenous New Bag/Given 06/16/18 1745)  sodium chloride 0.9 % bolus 1,000 mL (0 mLs Intravenous Stopped 06/16/18 1728)  ondansetron (ZOFRAN) injection 4 mg (4 mg Intravenous Given 06/16/18 1601)  iopamidol (ISOVUE-300) 61 % injection (100 mLs  Contrast Given 06/16/18 1640)  HYDROmorphone (DILAUDID) injection 0.5 mg (0.5 mg Intravenous Given 06/16/18 1635)  HYDROmorphone (DILAUDID) injection 1 mg (1 mg Intravenous Given 06/16/18 1744)     Initial Impression / Assessment and Plan / ED Course  I have reviewed the triage vital signs and the nursing notes.  Pertinent labs & imaging results that were available during my care of the patient were reviewed by me and considered in my medical decision making (see chart for details).  Clinical Course as  of Jun 16 1802  Sat Jun 16, 2018  1605 Discontinued morphine at 1552, however RN did not see and gave it at 1601.  Will hold on dilaudid for now.    [  EH]  1608 Spoke with Dr. Hilarie Fredrickson, on-call for [facility] who recommends labs, CT scan with IV contrast.   [EH]  42 No old to compare to.   Lipase(!): 348 [EH]  1735 Spoke with Dr. Hilarie Fredrickson from GI who will come to see the patient.   [EH]    Clinical Course User Index [EH] Lorin Glass, PA-C   Patient presents today for evaluation of abdominal pain.  She had a ERCP with CBD stent and dilation with duodenal biopsies on 06/14/2018 which showed duodenal adenocarcinoma.  She has been having worsening pain, consulted her GI who recommended coming to the emergency room.  Upon arrival here I touch base with Dr. Hilarie Fredrickson on-call for [facility] who recommended labs, and CT scan.  Patient's pain was treated in the emergency room with multiple doses of narcotic pain medicine.  Labs are significant for a elevated lipase at 348.  She does have transaminitis with elevated alk phos, however this appears improved from her previous labs.  Her potassium is low at 3.0.  As she is n.p.o. this was started to be IV replaced.  She has a mild leukocytosis at 10.6 with mild anemia at 10.8.  CT scan shows concern for peripancreatic inflammatory changes consistent with pancreatitis without obvious pseudocyst formation or evidence of necrosis.  She also has pancreatic duct dilation, concern for subcentimeter peripancreatic lymphadenopathy concerning for metastatic adenopathy in addition to multiple lung nodules concerning for metastatic disease.    I once again spoke with Dr. Hilarie Fredrickson who will come and see the patient.  Will medically admit patient for symptom control and further treatment.  I spoke with Dr. Roel Cluck who will admit the patient.   Final Clinical Impressions(s) / ED Diagnoses   Final diagnoses:  Other acute pancreatitis without infection or necrosis    Epigastric pain  Duodenal mass  Common bile duct (CBD) stricture  Adenocarcinoma of duodenum [facility])    ED Discharge Orders    None       Ollen Gross 06/17/18 0101    Lacretia Leigh, MD 06/17/18 [phone]

## 2018-06-17 ENCOUNTER — Encounter (HOSPITAL_COMMUNITY): Payer: Self-pay

## 2018-06-17 ENCOUNTER — Other Ambulatory Visit: Payer: Self-pay

## 2018-06-17 DIAGNOSIS — D649 Anemia, unspecified: Secondary | ICD-10-CM

## 2018-06-17 DIAGNOSIS — C170 Malignant neoplasm of duodenum: Secondary | ICD-10-CM

## 2018-06-17 DIAGNOSIS — R110 Nausea: Secondary | ICD-10-CM

## 2018-06-17 DIAGNOSIS — K831 Obstruction of bile duct: Secondary | ICD-10-CM

## 2018-06-17 LAB — HIV ANTIBODY (ROUTINE TESTING W REFLEX): HIV Screen 4th Generation wRfx: NONREACTIVE

## 2018-06-17 LAB — PHOSPHORUS: Phosphorus: 3.9 mg/dL (ref 2.5–4.6)

## 2018-06-17 LAB — LIPASE, BLOOD: Lipase: 204 U/L — ABNORMAL HIGH (ref 11–51)

## 2018-06-17 LAB — LIPID PANEL
CHOL/HDL RATIO: 6 ratio
Cholesterol: 186 mg/dL (ref 0–200)
HDL: 31 mg/dL — ABNORMAL LOW (ref >40)
LDL Cholesterol: 139 mg/dL — ABNORMAL HIGH (ref 0–99)
Triglycerides: 81 mg/dL (ref <150)
VLDL: 16 mg/dL (ref 0–40)

## 2018-06-17 LAB — COMPREHENSIVE METABOLIC PANEL
ALBUMIN: 3.4 g/dL — AB (ref 3.5–5.0)
ALT: 228 U/L — ABNORMAL HIGH (ref 0–44)
ANION GAP: 8 (ref 5–15)
AST: 69 U/L — ABNORMAL HIGH (ref 15–41)
Alkaline Phosphatase: 207 U/L — ABNORMAL HIGH (ref 38–126)
BUN: 12 mg/dL (ref 6–20)
CALCIUM: 8.2 mg/dL — AB (ref 8.9–10.3)
CO2: 22 mmol/L (ref 22–32)
Chloride: 106 mmol/L (ref 98–111)
Creatinine, Ser: 0.51 mg/dL (ref 0.44–1.00)
GFR calc Af Amer: >60 mL/min (ref >60)
GFR calc non Af Amer: >60 mL/min (ref >60)
Glucose, Bld: 100 mg/dL — ABNORMAL HIGH (ref 70–99)
Potassium: 3.5 mmol/L (ref 3.5–5.1)
Sodium: 136 mmol/L (ref 135–145)
Total Bilirubin: 2.7 mg/dL — ABNORMAL HIGH (ref 0.3–1.2)
Total Protein: 6.4 g/dL — ABNORMAL LOW (ref 6.5–8.1)

## 2018-06-17 LAB — CBC
HCT: 29.9 % — ABNORMAL LOW (ref 36.0–46.0)
Hemoglobin: 9.4 g/dL — ABNORMAL LOW (ref 12.0–15.0)
MCH: 30.9 pg (ref 26.0–34.0)
MCHC: 31.4 g/dL (ref 30.0–36.0)
MCV: 98.4 fL (ref 80.0–100.0)
NRBC: 0 % (ref 0.0–0.2)
Platelets: 348 10*3/uL (ref 150–400)
RBC: 3.04 MIL/uL — AB (ref 3.87–5.11)
RDW: 14.6 % (ref 11.5–15.5)
WBC: 6.2 10*3/uL (ref 4.0–10.5)

## 2018-06-17 LAB — MAGNESIUM: Magnesium: 1.8 mg/dL (ref 1.7–2.4)

## 2018-06-17 LAB — TSH: TSH: 8.083 u[IU]/mL — ABNORMAL HIGH (ref 0.350–4.500)

## 2018-06-17 MED ORDER — HYDROMORPHONE HCL 1 MG/ML IJ SOLN
1.0000 mg | INTRAMUSCULAR | Status: DC | PRN
  Administered 2018-06-17 (×2): 2 mg via INTRAVENOUS
  Administered 2018-06-17: 1 mg via INTRAVENOUS
  Administered 2018-06-18 (×8): 2 mg via INTRAVENOUS
  Administered 2018-06-18: 1 mg via INTRAVENOUS
  Administered 2018-06-18 – 2018-06-22 (×30): 2 mg via INTRAVENOUS
  Administered 2018-06-23: 1 mg via INTRAVENOUS
  Administered 2018-06-23 – 2018-06-25 (×18): 2 mg via INTRAVENOUS
  Administered 2018-06-25: 1 mg via INTRAVENOUS
  Administered 2018-06-25 – 2018-06-26 (×8): 2 mg via INTRAVENOUS
  Administered 2018-06-26: 1 mg via INTRAVENOUS
  Administered 2018-06-26 – 2018-06-27 (×2): 2 mg via INTRAVENOUS
  Administered 2018-06-27 (×2): 1 mg via INTRAVENOUS
  Filled 2018-06-17 (×15): qty 2
  Filled 2018-06-17: qty 1
  Filled 2018-06-17 (×28): qty 2
  Filled 2018-06-17: qty 1
  Filled 2018-06-17 (×26): qty 2
  Filled 2018-06-17: qty 1
  Filled 2018-06-17 (×3): qty 2
  Filled 2018-06-17: qty 1
  Filled 2018-06-17 (×2): qty 2

## 2018-06-17 MED ORDER — FLUCONAZOLE 150 MG PO TABS
150.0000 mg | ORAL_TABLET | Freq: Once | ORAL | Status: DC
  Filled 2018-06-17: qty 1

## 2018-06-17 MED ORDER — POTASSIUM CHLORIDE 10 MEQ/100ML IV SOLN
INTRAVENOUS | Status: AC
  Administered 2018-06-17: 10 meq
  Filled 2018-06-17: qty 100

## 2018-06-17 MED ORDER — CLOTRIMAZOLE 1 % VA CREA
1.0000 | TOPICAL_CREAM | Freq: Every day | VAGINAL | Status: AC
  Administered 2018-06-17 – 2018-06-23 (×7): 1 via VAGINAL
  Filled 2018-06-17: qty 45

## 2018-06-17 MED ORDER — HYDROMORPHONE HCL 1 MG/ML IJ SOLN
INTRAMUSCULAR | Status: AC
  Filled 2018-06-17: qty 1

## 2018-06-17 MED ORDER — HYDROMORPHONE HCL 1 MG/ML IJ SOLN
1.0000 mg | Freq: Once | INTRAMUSCULAR | Status: AC
  Administered 2018-06-17: 1 mg via INTRAVENOUS
  Filled 2018-06-17: qty 1

## 2018-06-17 MED ORDER — HYDROCORTISONE 1 % EX CREA
TOPICAL_CREAM | Freq: Three times a day (TID) | CUTANEOUS | Status: DC | PRN
  Administered 2018-06-17: 23:00:00 via TOPICAL
  Filled 2018-06-17 (×2): qty 28

## 2018-06-17 MED ORDER — HYDROXYZINE HCL 50 MG/ML IM SOLN
25.0000 mg | Freq: Four times a day (QID) | INTRAMUSCULAR | Status: DC | PRN
  Administered 2018-06-17: 25 mg via INTRAMUSCULAR
  Filled 2018-06-17 (×2): qty 0.5

## 2018-06-17 MED ORDER — PROMETHAZINE HCL 25 MG/ML IJ SOLN
12.5000 mg | Freq: Once | INTRAMUSCULAR | Status: AC
  Administered 2018-06-17: 12.5 mg via INTRAVENOUS
  Filled 2018-06-17: qty 1

## 2018-06-17 MED ORDER — HYDROCODONE-ACETAMINOPHEN 5-325 MG PO TABS: 2.0000 | ORAL_TABLET | Freq: Four times a day (QID) | ORAL | Status: DC | PRN

## 2018-06-17 MED ORDER — HYDROMORPHONE HCL 1 MG/ML IJ SOLN
1.0000 mg | INTRAMUSCULAR | Status: DC | PRN
  Administered 2018-06-17 (×3): 2 mg via INTRAVENOUS
  Filled 2018-06-17 (×3): qty 2

## 2018-06-17 MED ORDER — DIPHENHYDRAMINE HCL 50 MG/ML IJ SOLN
12.5000 mg | Freq: Four times a day (QID) | INTRAMUSCULAR | Status: DC | PRN
  Administered 2018-06-17 – 2018-06-21 (×8): 12.5 mg via INTRAVENOUS
  Filled 2018-06-17 (×9): qty 1

## 2018-06-17 MED ORDER — POTASSIUM CHLORIDE 10 MEQ/100ML IV SOLN
INTRAVENOUS | Status: AC
  Filled 2018-06-17: qty 100

## 2018-06-17 MED ORDER — HYDROMORPHONE HCL 1 MG/ML IJ SOLN
1.0000 mg | Freq: Once | INTRAMUSCULAR | Status: AC
  Administered 2018-06-17: 1 mg via INTRAVENOUS

## 2018-06-17 MED ORDER — DIPHENHYDRAMINE HCL 50 MG/ML IJ SOLN
12.5000 mg | Freq: Once | INTRAMUSCULAR | Status: AC
  Administered 2018-06-17: 12.5 mg via INTRAVENOUS
  Filled 2018-06-17: qty 1

## 2018-06-17 NOTE — Progress Notes (Signed)
Patient took several sips of a gingerale and had significant pain, medicated with 2 mg of IV Dilaudid which brought pain down from a 9 to an 8, notified MD who ordered an additional one time dose of 1 mg IV Dilaudid.  Will continue to monitor.  Patient continues to c/o itching in spite of Hydrocortisone cream and Iv Benadryl.

## 2018-06-17 NOTE — Progress Notes (Signed)
Progress Note   Subjective  Hailey Houston feels better today, pain significantly better.  Still receiving IV Dilaudid last dose 2 and half hours ago Scared to eat or drink because scared of pain No bowel movement or flatus Met Dr. Benay Spice, was told about her pulmonary nodules by x-ray.  She said her brother was recently diagnosed with sarcoid and she is holding out hope that the nodules are not related to her malignancy   Objective  Vital signs in last 24 hours: Temp:  [97.7 F (36.5 C)-98.9 F (37.2 C)] 98 F (36.7 C) (02/02 0628) Pulse Rate:  [79-91] 91 (02/02 0628) Resp:  [14-21] 16 (02/02 0628) BP: (115-147)/(60-83) 144/83 (02/02 0628) SpO2:  [98 %-100 %] 99 % (02/02 0628) Weight:  [105.3 kg] 105.3 kg (02/02 0049) Last BM Date: 06/16/18  Gen: awake, alert, NAD HEENT: les icteric, op clear CV: RRR, no mrg Pulm: CTA b/l Abd: soft, mild epigastric tenderness without rebound or guarding, much improved from yesterday, ND, +BS throughout Ext: no c/c/e Neuro: nonfocal   Intake/Output from previous day: 02/01 0701 - 02/02 0700 In: 1324.1 [I.V.:1080; IV Piggyback:244.1] Out: 0  Intake/Output this shift: No intake/output data recorded.  Lab Results: Recent Labs    06/16/18 1549 06/17/18 0515  WBC 10.6* 6.2  HGB 10.8* 9.4*  HCT 33.8* 29.9*  PLT 382 348   BMET Recent Labs    06/16/18 1549 06/17/18 0515  NA 136 136  K 3.0* 3.5  CL 101 106  CO2 25 22  GLUCOSE 107* 100*  BUN 13 12  CREATININE 0.69 0.51  CALCIUM 8.9 8.2*   LFT Recent Labs    06/17/18 0515  PROT 6.4*  ALBUMIN 3.4*  AST 69*  ALT 228*  ALKPHOS 207*  BILITOT 2.7*   PT/INR Recent Labs    06/16/18 1901  LABPROT 13.6  INR 1.05   Hepatitis Panel No results for input(s): HEPBSAG, HCVAB, HEPAIGM, HEPBIGM in the last 72 hours.  Studies/Results: Dg Chest 2 View  Result Date: 06/16/2018 CLINICAL DATA:  Pancreatic mass and colon cancer EXAM: CHEST - 2 VIEW COMPARISON:  None. FINDINGS: AP and  lateral views of the chest were obtained. The lungs are clear without focal pneumonia, edema, pneumothorax or pleural effusion. The cardiopericardial silhouette is within normal limits for size. Scattered bilateral pulmonary nodules are evident. The visualized bony structures of the thorax are intact. Telemetry leads overlie the chest. IMPRESSION: 1. Bilateral pulmonary nodules highly suspicious for metastatic disease. 2. No edema or pleural effusion. Electronically Signed   By: Misty Stanley M.D.   On: 06/16/2018 20:45   Ct Abdomen Pelvis W Contrast  Result Date: 06/16/2018 CLINICAL DATA:  Per patient ECG Thursday recent diagnosis pancreatic mass and colon caMid-Severe lower abdomen pain nauseaNo prior surgeriesPain since procedure, no fever. EXAM: CT ABDOMEN AND PELVIS WITH CONTRAST TECHNIQUE: Multidetector CT imaging of the abdomen and pelvis was performed using the standard protocol following bolus administration of intravenous contrast. CONTRAST:  174m ISOVUE-300 IOPAMIDOL (ISOVUE-300) INJECTION 61% COMPARISON:  None. FINDINGS: Lower chest: Multiple lung base nodules consistent with metastatic disease. Two largest on the left, 6 mm nodule, subpleural lateral left lower lobe, image 15, series 4 and 7 mm nodule, posteromedial left lower lobe, image 27. Largest on the right, 6 mm posterior inferior lower lobe nodule, image 23. No acute findings at the lung bases. Hepatobiliary: Liver normal in size and attenuation. No masses or focal lesions. There is intrahepatic biliary air. Gallbladder is mildly distended. It  contains dense fluid had at least 1 discrete stone. No wall thickening or adjacent inflammation. There is air within the common bile duct. A distal common bile duct stent extends across the pancreatic head to the second portion of the duodenum. Pancreas: There is fullness of the pancreatic head without a defined mass. Pancreatic duct is dilated to a maximum of 5 mm. There is hazy inflammatory change in  the peripancreatic fat tracking along the gastrohepatic ligament. Multiple peripancreatic subcentimeter lymph nodes are noted. Spleen: Normal in size without focal abnormality. Adrenals/Urinary Tract: No adrenal masses. No renal masses. 3-4 mm nonobstructing stone in the upper pole the right kidney. No other intrarenal stones. No hydronephrosis. Ureters are normal in course and in caliber. Bladder is unremarkable. Stomach/Bowel: Mild wall thickening of the duodenum adjacent to the pancreatic head, presumed reactive. Stomach is unremarkable. Small bowel is normal in caliber with no wall thickening or inflammation. Colon is normal in caliber with no wall thickening or inflammation. No CT evidence of a colonic mass. Normal appendix visualized. Vascular/Lymphatic: There are prominent retroperitoneal lymph nodes all subcentimeter short axis. Portal vein, superior mesenteric vein and splenic vein are patent. No aortic atherosclerosis. Reproductive: Well-positioned IUD. Uterus and adnexa otherwise unremarkable. Other: No abdominal wall hernia.  No ascites. Musculoskeletal: No fracture or acute finding. No osteoblastic or osteolytic lesions. Disc degenerative changes at L2-L3. IMPRESSION: 1. There are peripancreatic inflammatory changes consistent with pancreatitis. No discrete fluid collection is seen to suggest a pseudocyst. There is no venous thrombosis and no evidence of pancreatic necrosis. No other evidence of an acute abnormality. 2. Fullness of the pancreatic head. No defined mass. Pancreatic duct is dilated to 5 mm. 3. Multiple subcentimeter peripancreatic lymph nodes. Given the history of pancreatic carcinoma this is suspicious for metastatic adenopathy. 4. Well-positioned distal common bile duct stent. There is intrahepatic biliary and common bile duct air. 5. No visualized colonic mass. 6. Multiple lung base nodules consistent with metastatic disease. 7. Nonobstructing stone in the upper pole of the right  kidney. Electronically Signed   By: Lajean Manes M.D.   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  Post ERCP pancreatitis --she is improved today.  We have aggressively hydrated her and her white count, hemoglobin have declined indicating some delusional effect.  Pain is much improved from yesterday though still needing IV Dilaudid.  Lipase and liver enzymes trending down.  She has many questions about advancing diet --We discussed that she can start clear liquids but advance very slowly and listen to her body.  If worsening pain then decreased oral intake. --Continue IV pain control and IV fluids  2.  Biliary obstruction --relieved by metallic biliary stent.  Liver enzymes falling appropriately.  Pruritus and jaundice improving  3.  Duodenal adenocarcinoma --she has been seen by Dr. Benay Spice.  She feels like the plan is developing and she wishes to move as quickly as possible.  Dr. Benay Spice will arrange follow-up after discharge.  Appreciate his involvement and help.      LOS: 1 day   Lajuan Lines Braley Luckenbaugh  06/17/2018, 11:09 AM

## 2018-06-17 NOTE — Plan of Care (Signed)
Patient continues to c/o significant abdominal pain, requiring multiple doses of IV Dilaudid, on 7 a to 7 p shift.  Continues with itching and intermittent nausea as well.  Unable to tolerate anything by mouth without significant increase in abdominal pain.  Mother and sister at bedside.

## 2018-06-17 NOTE — Consult Note (Signed)
New Hematology/Oncology Consult   Requesting MD: Marylu Lund      Reason for Consult: Small bowel carcinoma  HPI: Hailey Houston reports the onset of feeling "green "and nauseated beginning 05/09/2018.  She developed dark urine 05/31/2018. She was evaluated at a [facility] facility in [place]) I do not have these records available today).  A CT revealed a pancreas head mass and biliary dilatation.  There is adenopathy at the porta hepatis and retroperitoneum.  Pulmonary nodules were noted.  She was referred to Dr. Ardis Hughs and was taken to an EUS and ERCP procedure on 06/14/2018.  A malignant mass was noted in the duodenum, proximal to the ampulla.  The mass was biopsied.  A biliary stricture was noted.  A metallic biliary stent was placed. The pathology from the duodenal mass biopsy returned as adenocarcinoma. She developed upper abdominal pain a few hours after the procedure.  The pain progressed and she presented to the emergency room on 06/16/2018.  Liver enzymes and lipase are elevated.  She was diagnosed with post ERCP pancreatitis. She was admitted.  A CT 06/16/2018 revealed peripancreatic inflammatory changes.  Fullness was noted at the pancreas head with no defined mass.  Multiple subcentimeter peripancreatic lymph nodes suspicious for metastatic adenopathy.  A stent is noted in the distal common bile duct.  Multiple lung base nodules consistent with metastatic disease.  Hailey Houston has noted improvement in abdominal pain.    Past Medical History:  Diagnosis Date  . ADD (attention deficit disorder)   . 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 Anxiety   . Family history of adverse reaction to anesthesia    mom goes into afib  . Wears glasses    driving  : .  [id number], IUD in place  Past Surgical History:  Procedure Laterality Date  . BILIARY STENT PLACEMENT N/A 06/14/2018   Procedure: BILIARY STENT PLACEMENT;  Surgeon: Milus Banister, MD;  Location: [facility];  Service: Endoscopy;   Laterality: N/A;  . BIOPSY  06/14/2018   Procedure: BIOPSY;  Surgeon: Milus Banister, MD;  Location: [facility];  Service: Endoscopy;;  . ENDOSCOPIC RETROGRADE CHOLANGIOPANCREATOGRAPHY (ERCP) WITH PROPOFOL N/A 06/14/2018   Procedure: ENDOSCOPIC RETROGRADE CHOLANGIOPANCREATOGRAPHY (ERCP) WITH PROPOFOL;  Surgeon: Milus Banister, MD;  Location: [facility];  Service: Endoscopy;  Laterality: N/A;  . ESOPHAGOGASTRODUODENOSCOPY N/A 06/14/2018   Procedure: ESOPHAGOGASTRODUODENOSCOPY (EGD);  Surgeon: Milus Banister, MD;  Location: [facility];  Service: Endoscopy;  Laterality: N/A;  . EUS N/A 06/14/2018   Procedure: UPPER ENDOSCOPIC ULTRASOUND (EUS) RADIAL;  Surgeon: Milus Banister, MD;  Location: [facility];  Service: Endoscopy;  Laterality: N/A;  . ORIF ANKLE FRACTURE Left 08/29/2013   Procedure: LEFT FIBULA -FRACTURE OPEN TREATMENT DISTAL FIBULAR (LATERALERAL MALLEOLUS) INCLUDES INTERNAL FIXATION, ;  Surgeon: [name], MD;  Location: [facility];  Service: Orthopedics;  Laterality: Left;  . PERINEAL LACERATION REPAIR     post childbirth  . SPHINCTEROTOMY  06/14/2018   Procedure: SPHINCTEROTOMY;  Surgeon: Milus Banister, MD;  Location: [facility];  Service: Endoscopy;;  . TIBIA IM NAIL INSERTION Left 08/29/2013   Procedure: FRACTURE TREATMENT TIBIAL SHAFT BY INTRAMEDULLARY IMPLANT WITH SCREWS ;  Surgeon: [name], MD;  Location: [facility];  Service: Orthopedics;  Laterality: Left;  :   Current Facility-Administered Medications:  .  0.9 %  sodium chloride infusion, , Intravenous, Continuous, Pyrtle, Lajuan Lines, MD, Stopped at 06/16/18 2248 .  acetaminophen (TYLENOL) tablet 650 mg, 650  mg, Oral, Q6H PRN **OR** acetaminophen (TYLENOL) suppository 650 mg, 650 mg, Rectal, Q6H PRN, Doutova, Anastassia, MD .  enoxaparin (LOVENOX) injection 40 mg, 40 mg, Subcutaneous, Q24H, Pyrtle, Lajuan Lines, MD, 40 mg at 06/16/18 2247 .  hydrocortisone cream 1 %, , Topical,  TID PRN, Donne Hazel, MD .  HYDROmorphone (DILAUDID) injection 1-2 mg, 1-2 mg, Intravenous, Q2H PRN, Pyrtle, Lajuan Lines, MD, 2 mg at 06/17/18 0258 .  hydrOXYzine (VISTARIL) injection 25 mg, 25 mg, Intramuscular, Q6H PRN, Donne Hazel, MD .  ondansetron [facility]) tablet 4 mg, 4 mg, Oral, Q6H PRN **OR** ondansetron (ZOFRAN) injection 4 mg, 4 mg, Intravenous, Q6H PRN, Doutova, Anastassia, MD .  ondansetron (ZOFRAN) injection 4 mg, 4 mg, Intravenous, Q6H PRN, Pyrtle, Lajuan Lines, MD, 4 mg at 06/17/18 0831 .  pantoprazole (PROTONIX) injection 40 mg, 40 mg, Intravenous, Q24H, Pyrtle, Lajuan Lines, MD, 40 mg at 06/16/18 2248 .  polyethylene glycol (MIRALAX / GLYCOLAX) packet 17 g, 17 g, Oral, BID, Pyrtle, Lajuan Lines, MD .  potassium chloride 10 MEQ/100ML IVPB, , , , :  . enoxaparin (LOVENOX) injection  40 mg Subcutaneous Q24H  . pantoprazole (PROTONIX) IV  40 mg Intravenous Q24H  . polyethylene glycol  17 g Oral BID  :  Allergies  Allergen Reactions  . Oxycodone Itching  :  FH: No family history of cancer  SOCIAL HISTORY: She lives in [place] and works in [place].  She works in Programmer, applications for a bank.  She is not use cigarettes.  She reports social alcohol use.  No transfusion history.  No risk factor for HIV or hepatitis.  Review of Systems:  Positives include: Hot flashes, 10 pound weight loss over the past 2 weeks, nausea, abdominal pain following the ERCP procedure-improved, soft fullness in the right axilla  A complete ROS was otherwise negative.   Physical Exam: Chaperone present  Blood pressure (!) 144/83, pulse 91, temperature 98 F (36.7 C), temperature source Oral, resp. rate 16, height 5' 11"  (1.803 m), weight 232 lb 3.2 oz (105.3 kg), SpO2 99 %.  HEENT: Oral cavity without visible mass, neck without mass Lungs: Clear bilaterally Cardiac: Regular rate and rhythm Abdomen: No hepatosplenomegaly, no mass, nontender  Vascular: No leg edema Lymph nodes: No cervical, supraclavicular,  axillary, or inguinal nodes Neurologic: The motor exam is intact in the upper and lower extremities bilaterally Skin: No rash Musculoskeletal: No spine tenderness Breast: Right breast without mass  LABS:  Recent Labs    06/16/18 1549 06/17/18 0515  WBC 10.6* 6.2  HGB 10.8* 9.4*  HCT 33.8* 29.9*  PLT 382 348    Recent Labs    06/16/18 1549 06/17/18 0515  NA 136 136  K 3.0* 3.5  CL 101 106  CO2 25 22  GLUCOSE 107* 100*  BUN 13 12  CREATININE 0.69 0.51  CALCIUM 8.9 8.2*  AST 69, ALT 228, bilirubin 2.7, alkaline phosphatase 207, lipase 204    RADIOLOGY:  Dg Chest 2 View  Result Date: 06/16/2018 CLINICAL DATA:  Pancreatic mass and colon cancer EXAM: CHEST - 2 VIEW COMPARISON:  None. FINDINGS: AP and lateral views of the chest were obtained. The lungs are clear without focal pneumonia, edema, pneumothorax or pleural effusion. The cardiopericardial silhouette is within normal limits for size. Scattered bilateral pulmonary nodules are evident. The visualized bony structures of the thorax are intact. Telemetry leads overlie the chest. IMPRESSION: 1. Bilateral pulmonary nodules highly suspicious for metastatic disease. 2. No edema or pleural effusion.  Electronically Signed   By: Misty Stanley M.D.   On: 06/16/2018 20:45   Ct Abdomen Pelvis W Contrast  Result Date: 06/16/2018 CLINICAL DATA:  Per patient ECG Thursday recent diagnosis pancreatic mass and colon caMid-Severe lower abdomen pain nauseaNo prior surgeriesPain since procedure, no fever. EXAM: CT ABDOMEN AND PELVIS WITH CONTRAST TECHNIQUE: Multidetector CT imaging of the abdomen and pelvis was performed using the standard protocol following bolus administration of intravenous contrast. CONTRAST:  141m ISOVUE-300 IOPAMIDOL (ISOVUE-300) INJECTION 61% COMPARISON:  None. FINDINGS: Lower chest: Multiple lung base nodules consistent with metastatic disease. Two largest on the left, 6 mm nodule, subpleural lateral left lower lobe, image  15, series 4 and 7 mm nodule, posteromedial left lower lobe, image 27. Largest on the right, 6 mm posterior inferior lower lobe nodule, image 23. No acute findings at the lung bases. Hepatobiliary: Liver normal in size and attenuation. No masses or focal lesions. There is intrahepatic biliary air. Gallbladder is mildly distended. It contains dense fluid had at least 1 discrete stone. No wall thickening or adjacent inflammation. There is air within the common bile duct. A distal common bile duct stent extends across the pancreatic head to the second portion of the duodenum. Pancreas: There is fullness of the pancreatic head without a defined mass. Pancreatic duct is dilated to a maximum of 5 mm. There is hazy inflammatory change in the peripancreatic fat tracking along the gastrohepatic ligament. Multiple peripancreatic subcentimeter lymph nodes are noted. Spleen: Normal in size without focal abnormality. Adrenals/Urinary Tract: No adrenal masses. No renal masses. 3-4 mm nonobstructing stone in the upper pole the right kidney. No other intrarenal stones. No hydronephrosis. Ureters are normal in course and in caliber. Bladder is unremarkable. Stomach/Bowel: Mild wall thickening of the duodenum adjacent to the pancreatic head, presumed reactive. Stomach is unremarkable. Small bowel is normal in caliber with no wall thickening or inflammation. Colon is normal in caliber with no wall thickening or inflammation. No CT evidence of a colonic mass. Normal appendix visualized. Vascular/Lymphatic: There are prominent retroperitoneal lymph nodes all subcentimeter short axis. Portal vein, superior mesenteric vein and splenic vein are patent. No aortic atherosclerosis. Reproductive: Well-positioned IUD. Uterus and adnexa otherwise unremarkable. Other: No abdominal wall hernia.  No ascites. Musculoskeletal: No fracture or acute finding. No osteoblastic or osteolytic lesions. Disc degenerative changes at L2-L3. IMPRESSION: 1. There  are peripancreatic inflammatory changes consistent with pancreatitis. No discrete fluid collection is seen to suggest a pseudocyst. There is no venous thrombosis and no evidence of pancreatic necrosis. No other evidence of an acute abnormality. 2. Fullness of the pancreatic head. No defined mass. Pancreatic duct is dilated to 5 mm. 3. Multiple subcentimeter peripancreatic lymph nodes. Given the history of pancreatic carcinoma this is suspicious for metastatic adenopathy. 4. Well-positioned distal common bile duct stent. There is intrahepatic biliary and common bile duct air. 5. No visualized colonic mass. 6. Multiple lung base nodules consistent with metastatic disease. 7. Nonobstructing stone in the upper pole of the right kidney. Electronically Signed   By: DLajean ManesM.D.   On: 06/16/2018 17:13   Dg Ercp Biliary & Pancreatic Ducts  Result Date: 06/14/2018 CLINICAL DATA:  CBD stent placement EXAM: ERCP TECHNIQUE: Multiple spot images obtained with the fluoroscopic device and submitted for interpretation post-procedure. FLUOROSCOPY TIME:  Fluoroscopy Time:  3 minutes and 28 seconds Radiation Exposure Index (if provided by the fluoroscopic device): Number of Acquired Spot Images: 0 COMPARISON:  None. FINDINGS: Imaging demonstrates cannulation of the common  bile duct and contrast filling the biliary tree. There is narrowing of the common bile duct through the pancreas. Subsequent images demonstrate biliary duct balloon dilatation. The final image demonstrates a metallic stent across the area of narrowing from the dilated common bile duct into the duodenum. IMPRESSION: Successful common bile duct stent placement as described. These images were submitted for radiologic interpretation only. Please see the procedural report for the amount of contrast and the fluoroscopy time utilized. Electronically Signed   By: Marybelle Killings M.D.   On: 06/14/2018 13:50    Assessment and Plan:   1.  Adenocarcinoma of the  duodenum  CT abdomen/pelvis 06/16/2018- changes of pancreatitis, fullness at the head of the pancreas without a defined mass, multiple subcentimeter peripancreatic lymph nodes, distal common bile duct stent, multiple nodules at the lung bases  EUS 06/14/2018-ulcerated mucosa/mass at the distal duodenal bulb with extension to the pancreas head, portal adenopathy, biopsy of duodenal mass-adenocarcinoma  ERCP 06/14/2018- distal duodenal bulb mass, 1 cm proximal to the ampulla causing biliary obstruction, status post placement of a metal common bile duct stent  2.  Biliary obstruction secondary to the duodenal mass, status post placement of a bile duct stent 06/14/2018 3.  Post ERCP pancreatitis 06/16/2018 4.  Anemia secondary to phlebotomy and multiple procedures 5.  G2, P2 6.  Nausea secondary to #1 7.  Attention deficit disorder  Hailey Houston has been diagnosed with adenocarcinoma of the duodenum.  She presented with biliary obstruction and underwent placement of a common bile duct stent 06/14/2018.  She is now admitted with post ERCP pancreatitis.  The pancreatitis appears to be improving.  A CT abdomen/pelvis 06/14/2018 revealed multiple nodules at the lower chest.  I reviewed the CT images with Hailey Houston.  The nodules have the appearance of metastatic disease.  She will obtain the CT scans from [facility] performed in January.  We will order a full chest CT at this has not been done.  We will also consider an outpatient PET scan and biopsy of a lung nodule to confirm a diagnosis of metastatic disease.  I will submit the duodenal tumor material for MSI, IHC, and molecular testing.  Outpatient follow-up will be scheduled at the Cancer center.    Betsy Coder, MD 06/17/2018, 8:35 AM

## 2018-06-17 NOTE — Progress Notes (Addendum)
PROGRESS NOTE    Hailey Houston  OZH:[id number] DOB: 02-21-1971 DOA: 06/16/2018 PCP: Patient, No Pcp Per    Brief Narrative:  48 y.o. 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 There was some concern for metastatic spread with pulmonary nodules and porta hepatis gastrohepatic area and retroperitoneal lymphadenopathy.  On 30 January patient undergone ERCP with ultrasound with propofol. No Pancreatic mass was found but  duodenum severely abnormal which was actually causing a distal CBD stricture she had a limited biliary sphincterectomy and balloon dilatation biopsies were obtained biliary stent placed Since procedure patient has had significant abdominal pain a try to use some Advil she also has tried to use Xanax for muscle relaxation and anxiety.  She called in office reporting severe pain was prescribed Vicodin but it did not seem to even touch her pain. The point she presented to emergency department Pain has became severe and unbearable she have had some nausea and unable to tolerate p.o. medications.  Or she could eat is about 3 spoonfuls of broth. No true vomiting just spitting up.   No associated diarrhea constipation no fevers  Output, some mild chills. Denies light headedness. Reports chest pain associated with abdominal pain No Shortness of breath.       Assessment & Plan:   Active Problems:   Adenocarcinoma of duodenum ([facility])   Pancreatitis   Hypokalemia   Dehydration   Nausea and vomiting   Acute pancreatitis  Pulmonary nodules  . Adenocarcinoma of duodenum [facility])  - Oncology consulted and following. Discussed with Dr. Benay Spice. Recommendations for further imaging, planned as outpatient -seems stable at present  . Pancreatitis - s/p recent ERCP -Had been NPO overnight -Continue with analgesics, IVF hydration -GI following -Will allow ice chips and sips with meds -Repeat cmp and lipase in AM  . Hypokalemia  - replaced  -repeat bmet in AM  . Dehydration  -secondary to presenting pancreatitis -Continued on IVF hydration  . Nausea and vomiting - Continue with supportive care   suspected pulmonary metastasis -  - Findings questionable for mets. Recommendation for follow up imaging as outpatient as well as PET with biopsy to confirm diagnosis of metastatic disease - Stable. On minimal O2 support at this time  DVT prophylaxis: Lovenox subQ Code Status: Full Family Communication: Pt in room, family not at bedside Disposition Plan: Uncertain at this time  Consultants:   GI  Oncology  Procedures:     Antimicrobials: Anti-infectives (From admission, onward)   None       Subjective: Complaining of epigastric and R sided pain  Objective: Vitals:   06/16/18 2117 06/17/18 0049 06/17/18 0628 06/17/18 1338  BP: (!) 147/83  (!) 144/83 136/77  Pulse: 81  91 76  Resp: 16  16 15   Temp: 98.4 F (36.9 C)  98 F (36.7 C) 98.1 F (36.7 C)  TempSrc: Oral  Oral Oral  SpO2: 99%  99% 95%  Weight:  105.3 kg    Height: 5\' 11"  (1.803 m)       Intake/Output Summary (Last 24 hours) at 06/17/2018  1615 Last data filed at 06/17/2018 0600 Gross per 24 hour  Intake 1324.13 ml  Output 0 ml  Net 1324.13 ml   Filed Weights   06/17/18 0049  Weight: 105.3 kg    Examination:  General exam: Appears calm and comfortable  Respiratory system: Clear to auscultation. Respiratory effort normal. Cardiovascular system: S1 & S2 heard, RRR Gastrointestinal system: decreased BS, generally tender  on palpation Central nervous system: Alert and oriented. No focal neurological deficits. Extremities: Symmetric 5 x 5 power. Skin: No rashes, lesions Psychiatry: Judgement and insight appear normal. Mood & affect appropriate.   Data Reviewed: I have personally reviewed following labs and imaging studies  CBC: Recent Labs  Lab 06/14/18 0930 06/16/18 1549 06/17/18 0515  WBC 6.3 10.6* 6.2  NEUTROABS  --  7.7  --   HGB 11.2* 10.8* 9.4*  HCT 34.4* 33.8* 29.9*  MCV 95.8 98.5 98.4  PLT 394 382 124   Basic Metabolic Panel: Recent Labs  Lab 06/14/18 0930 06/16/18 1549 06/17/18 0515  NA 137 136 136  K 3.7 3.0* 3.5  CL 106 101 106  CO2 23 25 22   GLUCOSE 113* 107* 100*  BUN 11 13 12   CREATININE 0.61 0.69 0.51  CALCIUM 9.3 8.9 8.2*  MG  --  1.9 1.8  PHOS  --  3.7 3.9   GFR: Estimated Creatinine Clearance: 118.6 mL/min (by C-G formula based on SCr of 0.51 mg/dL). Liver Function Tests: Recent Labs  Lab 06/14/18 0930 06/16/18 1549 06/17/18 0515  AST 282* 118* 69*  ALT 439* 322* 228*  ALKPHOS 341* 278* 207*  BILITOT 6.9* 2.8* 2.7*  PROT 7.6 7.6 6.4*  ALBUMIN 4.0 4.0 3.4*   Recent Labs  Lab 06/16/18 1549 06/17/18 0515  LIPASE 348* 204*   No results for input(s): AMMONIA in the last 168 hours. Coagulation Profile: Recent Labs  Lab 06/16/18 1901  INR 1.05   Cardiac Enzymes: No results for input(s): CKTOTAL, CKMB, CKMBINDEX, TROPONINI in the last 168 hours. BNP (last 3 results) No results for input(s): PROBNP in the last 8760 hours. HbA1C: No results for input(s): HGBA1C in the last 72 hours. CBG: No results for input(s): GLUCAP in the last 168 hours. Lipid Profile: Recent Labs    06/17/18 0515  CHOL 186  HDL 31*  LDLCALC 139*  TRIG 81  CHOLHDL 6.0   Thyroid Function Tests: Recent Labs    06/17/18 0515  TSH 8.083*   Anemia Panel: No results for input(s): VITAMINB12, FOLATE, FERRITIN, TIBC, IRON, RETICCTPCT in the last 72 hours. Sepsis Labs: No  results for input(s): PROCALCITON, LATICACIDVEN in the last 168 hours.  No results found for this or any previous visit (from the past 240 hour(s)).   Radiology Studies: Dg Chest 2 View  Result Date: 06/16/2018 CLINICAL DATA:  Pancreatic mass and colon cancer EXAM: CHEST - 2 VIEW COMPARISON:  None. FINDINGS: AP and lateral views of the chest were obtained. The lungs are clear without focal pneumonia, edema, pneumothorax or pleural effusion. The cardiopericardial silhouette is within normal limits for size. Scattered bilateral pulmonary nodules are evident. The visualized bony structures of the thorax are intact. Telemetry leads overlie the chest. IMPRESSION: 1. Bilateral pulmonary nodules highly suspicious for metastatic disease. 2. No edema or pleural effusion. Electronically Signed   By: Misty Stanley M.D.   On: 06/16/2018 20:45   Ct Abdomen Pelvis W Contrast  Result Date: 06/16/2018 CLINICAL DATA:  Per patient ECG Thursday recent diagnosis pancreatic mass and colon caMid-Severe  lower abdomen pain nauseaNo prior surgeriesPain since procedure, no fever. EXAM: CT ABDOMEN AND PELVIS WITH CONTRAST TECHNIQUE: Multidetector CT imaging of the abdomen and pelvis was performed using the standard protocol following bolus administration of intravenous contrast. CONTRAST:  159mL ISOVUE-300 IOPAMIDOL (ISOVUE-300) INJECTION 61% COMPARISON:  None. FINDINGS: Lower chest: Multiple lung base nodules consistent with metastatic disease. Two largest on the left, 6 mm nodule, subpleural lateral left lower lobe, image 15, series 4 and 7 mm nodule, posteromedial left lower lobe, image 27. Largest on the right, 6 mm posterior inferior lower lobe nodule, image 23. No acute findings at the lung bases. Hepatobiliary: Liver normal in size and attenuation. No masses or focal lesions. There is intrahepatic biliary air. Gallbladder is mildly distended. It contains dense fluid had at least 1 discrete stone. No wall thickening or adjacent  inflammation. There is air within the common bile duct. A distal common bile duct stent extends across the pancreatic head to the second portion of the duodenum. Pancreas: There is fullness of the pancreatic head without a defined mass. Pancreatic duct is dilated to a maximum of 5 mm. There is hazy inflammatory change in the peripancreatic fat tracking along the gastrohepatic ligament. Multiple peripancreatic subcentimeter lymph nodes are noted. Spleen: Normal in size without focal abnormality. Adrenals/Urinary Tract: No adrenal masses. No renal masses. 3-4 mm nonobstructing stone in the upper pole the right kidney. No other intrarenal stones. No hydronephrosis. Ureters are normal in course and in caliber. Bladder is unremarkable. Stomach/Bowel: Mild wall thickening of the duodenum adjacent to the pancreatic head, presumed reactive. Stomach is unremarkable. Small bowel is normal in caliber with no wall thickening or inflammation. Colon is normal in caliber with no wall thickening or inflammation. No CT evidence of a colonic mass. Normal appendix visualized. Vascular/Lymphatic: There are prominent retroperitoneal lymph nodes all subcentimeter short axis. Portal vein, superior mesenteric vein and splenic vein are patent. No aortic atherosclerosis. Reproductive: Well-positioned IUD. Uterus and adnexa otherwise unremarkable. Other: No abdominal wall hernia.  No ascites. Musculoskeletal: No fracture or acute finding. No osteoblastic or osteolytic lesions. Disc degenerative changes at L2-L3. IMPRESSION: 1. There are peripancreatic inflammatory changes consistent with pancreatitis. No discrete fluid collection is seen to suggest a pseudocyst. There is no venous thrombosis and no evidence of pancreatic necrosis. No other evidence of an acute abnormality. 2. Fullness of the pancreatic head. No defined mass. Pancreatic duct is dilated to 5 mm. 3. Multiple subcentimeter peripancreatic lymph nodes. Given the history of  pancreatic carcinoma this is suspicious for metastatic adenopathy. 4. Well-positioned distal common bile duct stent. There is intrahepatic biliary and common bile duct air. 5. No visualized colonic mass. 6. Multiple lung base nodules consistent with metastatic disease. 7. Nonobstructing stone in the upper pole of the right kidney. Electronically Signed   By: Lajean Manes M.D.   On: 06/16/2018 17:13    Scheduled Meds: . enoxaparin (LOVENOX) injection  40 mg Subcutaneous Q24H  . pantoprazole (PROTONIX) IV  40 mg Intravenous Q24H  . polyethylene glycol  17 g Oral BID   Continuous Infusions: . sodium chloride 200 mL/hr at 06/17/18 1325     LOS: 1 day   Marylu Lund, MD [facility] Pager On Amion  If 7PM-7AM, please contact night-coverage 06/17/2018, 4:15 PM

## 2018-06-18 ENCOUNTER — Inpatient Hospital Stay: Payer: Self-pay

## 2018-06-18 ENCOUNTER — Other Ambulatory Visit (HOSPITAL_COMMUNITY): Payer: Self-pay

## 2018-06-18 ENCOUNTER — Telehealth: Payer: Self-pay | Admitting: Oncology

## 2018-06-18 ENCOUNTER — Encounter: Payer: Self-pay | Admitting: Oncology

## 2018-06-18 DIAGNOSIS — R1010 Upper abdominal pain, unspecified: Secondary | ICD-10-CM

## 2018-06-18 DIAGNOSIS — R112 Nausea with vomiting, unspecified: Secondary | ICD-10-CM

## 2018-06-18 DIAGNOSIS — K9189 Other postprocedural complications and disorders of digestive system: Secondary | ICD-10-CM

## 2018-06-18 LAB — COMPREHENSIVE METABOLIC PANEL
ALT: 176 U/L — ABNORMAL HIGH (ref 0–44)
AST: 48 U/L — ABNORMAL HIGH (ref 15–41)
Albumin: 3.2 g/dL — ABNORMAL LOW (ref 3.5–5.0)
Alkaline Phosphatase: 186 U/L — ABNORMAL HIGH (ref 38–126)
Anion gap: 7 (ref 5–15)
BUN: 9 mg/dL (ref 6–20)
CHLORIDE: 104 mmol/L (ref 98–111)
CO2: 23 mmol/L (ref 22–32)
Calcium: 8.4 mg/dL — ABNORMAL LOW (ref 8.9–10.3)
Creatinine, Ser: 0.53 mg/dL (ref 0.44–1.00)
GFR calc Af Amer: >60 mL/min (ref >60)
GFR calc non Af Amer: >60 mL/min (ref >60)
Glucose, Bld: 86 mg/dL (ref 70–99)
POTASSIUM: 3.5 mmol/L (ref 3.5–5.1)
Sodium: 134 mmol/L — ABNORMAL LOW (ref 135–145)
Total Bilirubin: 1.9 mg/dL — ABNORMAL HIGH (ref 0.3–1.2)
Total Protein: 6.3 g/dL — ABNORMAL LOW (ref 6.5–8.1)

## 2018-06-18 LAB — LIPASE, BLOOD: Lipase: 102 U/L — ABNORMAL HIGH (ref 11–51)

## 2018-06-18 MED ORDER — DEXTROSE-NACL 5-0.45 % IV SOLN
INTRAVENOUS | Status: DC
  Administered 2018-06-18 – 2018-06-20 (×4): via INTRAVENOUS

## 2018-06-18 MED ORDER — LORAZEPAM 2 MG/ML IJ SOLN
0.5000 mg | Freq: Four times a day (QID) | INTRAMUSCULAR | Status: DC | PRN
  Administered 2018-06-19 – 2018-06-28 (×11): 0.5 mg via INTRAVENOUS
  Filled 2018-06-18 (×11): qty 1

## 2018-06-18 MED ORDER — DEXTROSE-NACL 2.5-0.45 % IV SOLN
INTRAVENOUS | Status: DC
  Filled 2018-06-18: qty 1000

## 2018-06-18 NOTE — Telephone Encounter (Signed)
A hospital follow up appt has been scheduled for the pt to see Dr. Benay Spice on 2/13 at 230pm. Pt is currently in the hospital. Letter mailed.

## 2018-06-18 NOTE — Progress Notes (Signed)
PROGRESS NOTE    Hailey Houston  TDD:[id number] DOB: 09/10/1970 DOA: 06/16/2018 PCP: Patient, No Pcp Per    Brief Narrative:  48 y.o. 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 There was some concern for metastatic spread with pulmonary nodules and porta hepatis gastrohepatic area and retroperitoneal lymphadenopathy.  On 30 January patient undergone ERCP with ultrasound with propofol. No Pancreatic mass was found but  duodenum severely abnormal which was actually causing a distal CBD stricture she had a limited biliary sphincterectomy and balloon dilatation biopsies were obtained biliary stent placed Since procedure patient has had significant abdominal pain a try to use some Advil she also has tried to use Xanax for muscle relaxation and anxiety.  She called in office reporting severe pain was prescribed Vicodin but it did not seem to even touch her pain. The point she presented to emergency department Pain has became severe and unbearable she have had some nausea and unable to tolerate p.o. medications.  Or she could eat is about 3 spoonfuls of broth. No true vomiting just spitting up.   No associated diarrhea constipation no fevers  Output, some mild chills. Denies light headedness. Reports chest pain associated with abdominal pain No Shortness of breath.       Assessment & Plan:   Active Problems:   Adenocarcinoma of duodenum (HCC)   Pancreatitis   Hypokalemia   Dehydration   Nausea and vomiting   Acute pancreatitis  Pulmonary nodules  . Adenocarcinoma of duodenum [facility])  - Oncology consulted and following. Had earlier discussed with Dr. Benay Spice. Recommendations for further imaging, planned as outpatient -seems stable at present  . Pancreatitis - s/p recent ERCP -Had been NPO overnight -Continue with analgesics, IVF hydration -GI following -Currently on ice chips and sips with meds -Repeat cmp and lipase in AM  . Hypokalemia  - replaced  - repeat BMET in AM  . Dehydration  -secondary to presenting pancreatitis -Continue patient on IVF hydration  . Nausea and vomiting - Continue with supportive care   suspected pulmonary metastasis -  - Findings questionable for mets. Recommendation for follow up imaging as outpatient as well as PET with biopsy to confirm diagnosis of metastatic disease - Stable. On minimal O2 support at this time  DVT prophylaxis: Lovenox subQ Code Status: Full Family Communication: Pt in room, family at bedside Disposition Plan: Uncertain at this time  Consultants:   GI  Oncology  Procedures:     Antimicrobials: Anti-infectives (From admission, onward)   Start     Dose/Rate Route Frequency Ordered Stop   06/17/18 1700  fluconazole (DIFLUCAN) tablet 150 mg     150 mg Oral  Once 06/17/18 1625        Subjective: Complaining of continued ab pain  Objective: Vitals:   06/17/18 1338 06/17/18 2127 06/18/18 0147 06/18/18 0632  BP: 136/77 138/73 133/71 127/71  Pulse: 76 96 85 91  Resp: 15 18 16 16   Temp: 98.1 F (36.7 C) 98.8 F (37.1 C) 98.4 F (36.9 C) 98.7 F (37.1 C)  TempSrc: Oral Oral Oral  Oral  SpO2: 95% 100% 97% 97%  Weight:      Height:        Intake/Output Summary (Last 24 hours) at 06/18/2018 1354 Last data filed at 06/17/2018 2217 Gross per 24 hour  Intake 1778.67 ml  Output -  Net 1778.67 ml   Filed Weights   06/17/18 0049  Weight: 105.3 kg    Examination: General exam: Awake, laying in bed, in nad Respiratory system: Normal  respiratory effort, no wheezing Cardiovascular system: regular rate, s1, s2 Gastrointestinal system: Pos BS, nondistended Central nervous system: CN2-12 grossly intact, strength intact Extremities: Perfused, no clubbing Skin: Normal skin turgor, no notable skin lesions seen Psychiatry: Mood normal // no visual hallucinations   Data Reviewed: I have personally reviewed following labs and imaging studies  CBC: Recent Labs  Lab 06/14/18 0930 06/16/18 1549 06/17/18 0515  WBC 6.3 10.6* 6.2  NEUTROABS  --  7.7  --   HGB 11.2* 10.8* 9.4*  HCT 34.4* 33.8* 29.9*  MCV 95.8 98.5 98.4  PLT 394 382 081   Basic Metabolic Panel: Recent Labs  Lab 06/14/18 0930 06/16/18 1549 06/17/18 0515 06/18/18 0521  NA 137 136 136 134*  K 3.7 3.0* 3.5 3.5  CL 106 101 106 104  CO2 23 25 22 23   GLUCOSE 113* 107* 100* 86  BUN 11 13 12 9   CREATININE 0.61 0.69 0.51 0.53  CALCIUM 9.3 8.9 8.2* 8.4*  MG  --  1.9 1.8  --   PHOS  --  3.7 3.9  --    GFR: Estimated Creatinine Clearance: 118.6 mL/min (by C-G formula based on SCr of 0.53 mg/dL). Liver Function Tests: Recent Labs  Lab 06/14/18 0930 06/16/18 1549 06/17/18 0515 06/18/18 0521  AST 282* 118* 69* 48*  ALT 439* 322* 228* 176*  ALKPHOS 341* 278* 207* 186*  BILITOT 6.9* 2.8* 2.7* 1.9*  PROT 7.6 7.6 6.4* 6.3*  ALBUMIN 4.0 4.0 3.4* 3.2*   Recent Labs  Lab 06/16/18 1549 06/17/18 0515 06/18/18 0521  LIPASE 348* 204* 102*   No results for input(s): AMMONIA in the last 168 hours. Coagulation Profile: Recent Labs  Lab 06/16/18 1901  INR 1.05   Cardiac Enzymes: No results for input(s): CKTOTAL, CKMB, CKMBINDEX, TROPONINI in the last 168 hours. BNP (last 3 results) No results for input(s): PROBNP in the last 8760 hours. HbA1C: No results for input(s): HGBA1C in the last 72 hours. CBG: No results for input(s): GLUCAP in the last 168 hours. Lipid Profile: Recent Labs    06/17/18 0515  CHOL 186  HDL 31*  LDLCALC 139*  TRIG 81    CHOLHDL 6.0   Thyroid Function Tests: Recent Labs    06/17/18 0515  TSH 8.083*   Anemia Panel: No results for input(s): VITAMINB12, FOLATE, FERRITIN, TIBC, IRON, RETICCTPCT in the last 72 hours. Sepsis Labs: No results for input(s): PROCALCITON, LATICACIDVEN in the last 168 hours.  No results found for this or any previous visit (from the past 240 hour(s)).   Radiology Studies: Dg Chest 2 View  Result Date: 06/16/2018 CLINICAL DATA:  Pancreatic mass and colon cancer EXAM: CHEST - 2 VIEW COMPARISON:  None. FINDINGS: AP and lateral views of the chest were obtained. The lungs are clear without focal pneumonia, edema, pneumothorax or pleural effusion. The cardiopericardial silhouette is within normal limits for size. Scattered bilateral pulmonary nodules are evident. The visualized bony structures of the thorax are intact. Telemetry leads overlie the chest. IMPRESSION: 1. Bilateral pulmonary nodules highly  suspicious for metastatic disease. 2. No edema or pleural effusion. Electronically Signed   By: Misty Stanley M.D.   On: 06/16/2018 20:45   Ct Abdomen Pelvis W Contrast  Result Date: 06/16/2018 CLINICAL DATA:  Per patient ECG Thursday recent diagnosis pancreatic mass and colon caMid-Severe lower abdomen pain nauseaNo prior surgeriesPain since procedure, no fever. EXAM: CT ABDOMEN AND PELVIS WITH CONTRAST TECHNIQUE: Multidetector CT imaging of the abdomen and pelvis was performed using the standard protocol following bolus administration of intravenous contrast. CONTRAST:  156mL ISOVUE-300 IOPAMIDOL (ISOVUE-300) INJECTION 61% COMPARISON:  None. FINDINGS: Lower chest: Multiple lung base nodules consistent with metastatic disease. Two largest on the left, 6 mm nodule, subpleural lateral left lower lobe, image 15, series 4 and 7 mm nodule, posteromedial left lower lobe, image 27. Largest on the right, 6 mm posterior inferior lower lobe nodule, image 23. No acute findings at the lung bases.  Hepatobiliary: Liver normal in size and attenuation. No masses or focal lesions. There is intrahepatic biliary air. Gallbladder is mildly distended. It contains dense fluid had at least 1 discrete stone. No wall thickening or adjacent inflammation. There is air within the common bile duct. A distal common bile duct stent extends across the pancreatic head to the second portion of the duodenum. Pancreas: There is fullness of the pancreatic head without a defined mass. Pancreatic duct is dilated to a maximum of 5 mm. There is hazy inflammatory change in the peripancreatic fat tracking along the gastrohepatic ligament. Multiple peripancreatic subcentimeter lymph nodes are noted. Spleen: Normal in size without focal abnormality. Adrenals/Urinary Tract: No adrenal masses. No renal masses. 3-4 mm nonobstructing stone in the upper pole the right kidney. No other intrarenal stones. No hydronephrosis. Ureters are normal in course and in caliber. Bladder is unremarkable. Stomach/Bowel: Mild wall thickening of the duodenum adjacent to the pancreatic head, presumed reactive. Stomach is unremarkable. Small bowel is normal in caliber with no wall thickening or inflammation. Colon is normal in caliber with no wall thickening or inflammation. No CT evidence of a colonic mass. Normal appendix visualized. Vascular/Lymphatic: There are prominent retroperitoneal lymph nodes all subcentimeter short axis. Portal vein, superior mesenteric vein and splenic vein are patent. No aortic atherosclerosis. Reproductive: Well-positioned IUD. Uterus and adnexa otherwise unremarkable. Other: No abdominal wall hernia.  No ascites. Musculoskeletal: No fracture or acute finding. No osteoblastic or osteolytic lesions. Disc degenerative changes at L2-L3. IMPRESSION: 1. There are peripancreatic inflammatory changes consistent with pancreatitis. No discrete fluid collection is seen to suggest a pseudocyst. There is no venous thrombosis and no evidence of  pancreatic necrosis. No other evidence of an acute abnormality. 2. Fullness of the pancreatic head. No defined mass. Pancreatic duct is dilated to 5 mm. 3. Multiple subcentimeter peripancreatic lymph nodes. Given the history of pancreatic carcinoma this is suspicious for metastatic adenopathy. 4. Well-positioned distal common bile duct stent. There is intrahepatic biliary and common bile duct air. 5. No visualized colonic mass. 6. Multiple lung base nodules consistent with metastatic disease. 7. Nonobstructing stone in the upper pole of the right kidney. Electronically Signed   By: Lajean Manes M.D.   On: 06/16/2018 17:13    Scheduled Meds: . clotrimazole  1 Applicatorful Vaginal QHS  . enoxaparin (LOVENOX) injection  40 mg Subcutaneous Q24H  . fluconazole  150 mg Oral Once  . pantoprazole (PROTONIX) IV  40 mg Intravenous Q24H   Continuous Infusions: . dextrose 2.5 % and 0.45 % NaCl       LOS: 2  days   Marylu Lund, MD [facility] Pager On Amion  If 7PM-7AM, please contact night-coverage 06/18/2018, 1:54 PM

## 2018-06-18 NOTE — Progress Notes (Addendum)
Patient ID: Hailey Houston, female   DOB: 1970-11-16, 48 y.o.   MRN: [id number]    Progress Note   Subjective    Abdominal pain persists - pain meds help but wearing off around 3 hours . Had severe pain yesterday after trying ginger ale  Upset about interaction  with hospitalist earlier today - feels they are not wanting her to have pain meds, and focused on BM's No BM x 5 days - has not eaten x 4 days  Admits to anxiety in general about situation    Objective   Vital signs in last 24 hours: Temp:  [98.1 F (36.7 C)-98.8 F (37.1 C)] 98.7 F (37.1 C) (02/03 0632) Pulse Rate:  [76-96] 91 (02/03 0632) Resp:  [15-18] 16 (02/03 0632) BP: (127-138)/(71-77) 127/71 (02/03 0632) SpO2:  [95 %-100 %] 97 % (02/03 4098) Last BM Date: 06/16/18(per pt) General:    white female in NAD, uncomfortable appearing, tearful Heart:  Regular rate and rhythm; no murmurs Lungs: Respirations even and unlabored, lungs CTA bilaterally Abdomen:  Soft, tender across upper abdomen and nondistended.  BS present Extremities:  Without edema. Neurologic:  Alert and oriented,  grossly normal neurologically. Psych:  Cooperative. Normal mood and affect.  Intake/Output from previous day: 02/02 0701 - 02/03 0700 In: 1778.7 [I.V.:1778.7] Out: -  Intake/Output this shift: No intake/output data recorded.  Lab Results: Recent Labs    06/16/18 1549 06/17/18 0515  WBC 10.6* 6.2  HGB 10.8* 9.4*  HCT 33.8* 29.9*  PLT 382 348   BMET Recent Labs    06/16/18 1549 06/17/18 0515 06/18/18 0521  NA 136 136 134*  K 3.0* 3.5 3.5  CL 101 106 104  CO2 25 22 23   GLUCOSE 107* 100* 86  BUN 13 12 9   CREATININE 0.69 0.51 0.53  CALCIUM 8.9 8.2* 8.4*   LFT Recent Labs    06/18/18 0521  PROT 6.3*  ALBUMIN 3.2*  AST 48*  ALT 176*  ALKPHOS 186*  BILITOT 1.9*   PT/INR Recent Labs    06/16/18 1901  LABPROT 13.6  INR 1.05    Studies/Results: Dg Chest 2 View  Result Date: 06/16/2018 CLINICAL DATA:   Pancreatic mass and colon cancer EXAM: CHEST - 2 VIEW COMPARISON:  None. FINDINGS: AP and lateral views of the chest were obtained. The lungs are clear without focal pneumonia, edema, pneumothorax or pleural effusion. The cardiopericardial silhouette is within normal limits for size. Scattered bilateral pulmonary nodules are evident. The visualized bony structures of the thorax are intact. Telemetry leads overlie the chest. IMPRESSION: 1. Bilateral pulmonary nodules highly suspicious for metastatic disease. 2. No edema or pleural effusion. Electronically Signed   By: Misty Stanley M.D.   On: 06/16/2018 20:45   Ct Abdomen Pelvis W Contrast  Result Date: 06/16/2018 CLINICAL DATA:  Per patient ECG Thursday recent diagnosis pancreatic mass and colon caMid-Severe lower abdomen pain nauseaNo prior surgeriesPain since procedure, no fever. EXAM: CT ABDOMEN AND PELVIS WITH CONTRAST TECHNIQUE: Multidetector CT imaging of the abdomen and pelvis was performed using the standard protocol following bolus administration of intravenous contrast. CONTRAST:  19mL ISOVUE-300 IOPAMIDOL (ISOVUE-300) INJECTION 61% COMPARISON:  None. FINDINGS: Lower chest: Multiple lung base nodules consistent with metastatic disease. Two largest on the left, 6 mm nodule, subpleural lateral left lower lobe, image 15, series 4 and 7 mm nodule, posteromedial left lower lobe, image 27. Largest on the right, 6 mm posterior inferior lower lobe nodule, image 23. No acute findings at the  lung bases. Hepatobiliary: Liver normal in size and attenuation. No masses or focal lesions. There is intrahepatic biliary air. Gallbladder is mildly distended. It contains dense fluid had at least 1 discrete stone. No wall thickening or adjacent inflammation. There is air within the common bile duct. A distal common bile duct stent extends across the pancreatic head to the second portion of the duodenum. Pancreas: There is fullness of the pancreatic head without a defined  mass. Pancreatic duct is dilated to a maximum of 5 mm. There is hazy inflammatory change in the peripancreatic fat tracking along the gastrohepatic ligament. Multiple peripancreatic subcentimeter lymph nodes are noted. Spleen: Normal in size without focal abnormality. Adrenals/Urinary Tract: No adrenal masses. No renal masses. 3-4 mm nonobstructing stone in the upper pole the right kidney. No other intrarenal stones. No hydronephrosis. Ureters are normal in course and in caliber. Bladder is unremarkable. Stomach/Bowel: Mild wall thickening of the duodenum adjacent to the pancreatic head, presumed reactive. Stomach is unremarkable. Small bowel is normal in caliber with no wall thickening or inflammation. Colon is normal in caliber with no wall thickening or inflammation. No CT evidence of a colonic mass. Normal appendix visualized. Vascular/Lymphatic: There are prominent retroperitoneal lymph nodes all subcentimeter short axis. Portal vein, superior mesenteric vein and splenic vein are patent. No aortic atherosclerosis. Reproductive: Well-positioned IUD. Uterus and adnexa otherwise unremarkable. Other: No abdominal wall hernia.  No ascites. Musculoskeletal: No fracture or acute finding. No osteoblastic or osteolytic lesions. Disc degenerative changes at L2-L3. IMPRESSION: 1. There are peripancreatic inflammatory changes consistent with pancreatitis. No discrete fluid collection is seen to suggest a pseudocyst. There is no venous thrombosis and no evidence of pancreatic necrosis. No other evidence of an acute abnormality. 2. Fullness of the pancreatic head. No defined mass. Pancreatic duct is dilated to 5 mm. 3. Multiple subcentimeter peripancreatic lymph nodes. Given the history of pancreatic carcinoma this is suspicious for metastatic adenopathy. 4. Well-positioned distal common bile duct stent. There is intrahepatic biliary and common bile duct air. 5. No visualized colonic mass. 6. Multiple lung base nodules  consistent with metastatic disease. 7. Nonobstructing stone in the upper pole of the right kidney. Electronically Signed   By: Lajean Manes M.D.   On: 06/16/2018 17:13       Assessment / Plan:     #1 48 yo WF with new dx of Adenocarcinoma of duodenum with bilary obstruction . Admitted with post ERCP pancreatits after ERCP /EGD/EUS and bilary stent placement 06/04/18. No worrisome parameters .  LFT's are trending down appropriately  Pain has persisted and unable thus far to take any po's. Requiring regular doses of Dilaudid- may be another 3- 4 days before able to take pos's  #2 Constipation - not unexpected  #3 Nausea  - multifactorial #4 anxiety - situational  #5 lung nodules - worrisome for metastatic disease- will Need Chest CT  Can do before Discharge,, and PET scan outpt  Dr Benay Spice  Following   Plan; Restart IV fluids until she is taking po's adequately Continue IV dilaudid  q 2-3 hours prn. She was requiring pain meds prior to admit , and will need opioid   on discharge . Start ice chips, sips water -do not advance until she is ready   Will give tap water enema x 1 today, try to start ambulating , would not give any laxatives PO for now Start Xanax - low dose prn    Active Problems:   Adenocarcinoma of duodenum ([place])  Pancreatitis   Hypokalemia   Dehydration   Nausea and vomiting   Acute pancreatitis   Pulmonary nodules     LOS: 2 days   [name]  06/18/2018, 11:44 AM   I have discussed the case with the PA, and that is the plan I formulated. I personally interviewed and examined the patient.  Her pancreatitis is slow to resolve, so we will continue supportive management with sufficient opioid pain control, antiemetics, IV fluids and trials of water and popsicles until she is improved.  I also spoke with Dr.Chiu, who graciously agreed to address the patient's understandable anxiety over her entire situation.  She has not had a bowel movement about 5 days, at  least partially because she is taking little by mouth, also because she is on opioids for pain control of this pancreatitis episode.  I hope not to need stimulant laxative such as Dulcolax, as I am concerned that would set off her abdominal pain.  We will start with a warm tap water enema and proceed to low-dose MiraLAX after that if the patient can tolerate some liquids by mouth without much increase in pain.  Much thanks to hospitalist service, and also to oncology for consultation.  Total time 30 minutes.  [name] Office: [phone]

## 2018-06-18 NOTE — Progress Notes (Signed)
  Oncology Nurse Navigator Documentation     Met with patient, her mother and sister on in-patient unit to introduce myself and the role of GI Navigator. Contact information for treatment team members provided.  General overview of what is involved during initial consult with Dr. Benay Spice explained. Provided my contact information for questions or concerns. Will contact LCSW for help in telling children about diagnosis.  Will provide additional resources and educational material at initial consult.

## 2018-06-19 DIAGNOSIS — K858 Other acute pancreatitis without necrosis or infection: Principal | ICD-10-CM

## 2018-06-19 LAB — HEPATIC FUNCTION PANEL
ALT: 139 U/L — ABNORMAL HIGH (ref 0–44)
AST: 42 U/L — ABNORMAL HIGH (ref 15–41)
Albumin: 3.2 g/dL — ABNORMAL LOW (ref 3.5–5.0)
Alkaline Phosphatase: 170 U/L — ABNORMAL HIGH (ref 38–126)
Bilirubin, Direct: 0.9 mg/dL — ABNORMAL HIGH (ref 0.0–0.2)
Indirect Bilirubin: 1 mg/dL — ABNORMAL HIGH (ref 0.3–0.9)
TOTAL PROTEIN: 6.5 g/dL (ref 6.5–8.1)
Total Bilirubin: 1.9 mg/dL — ABNORMAL HIGH (ref 0.3–1.2)

## 2018-06-19 LAB — LIPASE, BLOOD: LIPASE: 88 U/L — AB (ref 11–51)

## 2018-06-19 MED ORDER — KETOROLAC TROMETHAMINE 30 MG/ML IJ SOLN
30.0000 mg | Freq: Once | INTRAMUSCULAR | Status: AC
  Administered 2018-06-19: 30 mg via INTRAVENOUS
  Filled 2018-06-19: qty 1

## 2018-06-19 NOTE — Progress Notes (Signed)
PROGRESS NOTE    ANAHY ESH  RSW:[id number] DOB: [birth year] DOA: 06/16/2018 PCP: Patient, No Pcp Per    Brief Narrative:  48 y.o. 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 There was some concern for metastatic spread with pulmonary nodules and porta hepatis gastrohepatic area and retroperitoneal lymphadenopathy.  On 30 January patient undergone ERCP with ultrasound with propofol. No Pancreatic mass was found but  duodenum severely abnormal which was actually causing a distal CBD stricture she had a limited biliary sphincterectomy and balloon dilatation biopsies were obtained biliary stent placed Since procedure patient has had significant abdominal pain a try to use some Advil she also has tried to use Xanax for muscle relaxation and anxiety.  She called in office reporting severe pain was prescribed Vicodin but it did not seem to even touch her pain. The point she presented to emergency department Pain has became severe and unbearable she have had some nausea and unable to tolerate p.o. medications.  Or she could eat is about 3 spoonfuls of broth. No true vomiting just spitting up.   No associated diarrhea constipation no fevers  Output, some mild chills. Denies light headedness. Reports chest pain associated with abdominal pain No Shortness of breath.     Assessment & Plan:   Active Problems:   Adenocarcinoma of duodenum (HCC)   Pancreatitis   Hypokalemia   Dehydration   Nausea and vomiting   Acute pancreatitis   Pulmonary  nodules  . Adenocarcinoma of duodenum [facility])  - Oncology consulted and following. Had earlier discussed with Dr. Benay Spice. Recommendations for further imaging, planned as outpatient -Presently stable  . Pancreatitis - s/p recent ERCP -Had been NPO overnight -Continue with analgesics, IVF hydration -GI following -Currently on ice chips and sips with meds -Lipase down to 88 this AM. Advance diet per GI  . Hypokalemia  - replaced  - repeat BMET in AM  . Dehydration  -secondary to presenting pancreatitis -Continue patient on IVF hydration  . Nausea and vomiting - Continue with supportive care   suspected pulmonary metastasis -  - Findings questionable for mets. Recommendation for follow up imaging as outpatient as well as PET with biopsy to confirm diagnosis of metastatic disease - Stable. On minimal O2 support at this time  DVT prophylaxis: Lovenox subQ Code Status: Full Family Communication: Pt in room, family at bedside Disposition Plan: Uncertain at this time  Consultants:   GI  Oncology  Procedures:     Antimicrobials: Anti-infectives (From admission, onward)   Start     Dose/Rate Route Frequency Ordered Stop   06/17/18 1700  fluconazole (DIFLUCAN) tablet 150 mg  Status:  Discontinued     150 mg Oral  Once 06/17/18 1625 06/19/18 1120      Subjective: Ambulating in hallway, in better spirits  Objective: Vitals:   06/19/18 0614 06/19/18 0743 06/19/18 0757 06/19/18 1322  BP: 126/75 (!) 146/82 (!) 145/78 (!) 145/89  Pulse: 80 90  93  Resp: 18 18  17   Temp: 98.1 F (36.7 C) 98.3 F (36.8 C)  98.9 F (  37.2 C)  TempSrc: Oral Oral  Oral  SpO2: 98% 99%  99%  Weight:      Height:        Intake/Output Summary (Last 24 hours) at 06/19/2018 1337 Last data filed at 06/19/2018 0700 Gross per 24 hour  Intake 1397.83 ml  Output -  Net 1397.83 ml   Filed Weights   06/17/18 0049  Weight: 105.3 kg    Examination: General exam: Conversant, in no acute  distress, ambulating Respiratory system: normal chest rise, clear, no audible wheezing  Data Reviewed: I have personally reviewed following labs and imaging studies  CBC: Recent Labs  Lab 06/14/18 0930 06/16/18 1549 06/17/18 0515  WBC 6.3 10.6* 6.2  NEUTROABS  --  7.7  --   HGB 11.2* 10.8* 9.4*  HCT 34.4* 33.8* 29.9*  MCV 95.8 98.5 98.4  PLT 394 382 284   Basic Metabolic Panel: Recent Labs  Lab 06/14/18 0930 06/16/18 1549 06/17/18 0515 06/18/18 0521  NA 137 136 136 134*  K 3.7 3.0* 3.5 3.5  CL 106 101 106 104  CO2 23 25 22 23   GLUCOSE 113* 107* 100* 86  BUN 11 13 12 9   CREATININE 0.61 0.69 0.51 0.53  CALCIUM 9.3 8.9 8.2* 8.4*  MG  --  1.9 1.8  --   PHOS  --  3.7 3.9  --    GFR: Estimated Creatinine Clearance: 118.6 mL/min (by C-G formula based on SCr of 0.53 mg/dL). Liver Function Tests: Recent Labs  Lab 06/14/18 0930 06/16/18 1549 06/17/18 0515 06/18/18 0521 06/19/18 0554  AST 282* 118* 69* 48* 42*  ALT 439* 322* 228* 176* 139*  ALKPHOS 341* 278* 207* 186* 170*  BILITOT 6.9* 2.8* 2.7* 1.9* 1.9*  PROT 7.6 7.6 6.4* 6.3* 6.5  ALBUMIN 4.0 4.0 3.4* 3.2* 3.2*   Recent Labs  Lab 06/16/18 1549 06/17/18 0515 06/18/18 0521 06/19/18 0554  LIPASE 348* 204* 102* 88*   No results for input(s): AMMONIA in the last 168 hours. Coagulation Profile: Recent Labs  Lab 06/16/18 1901  INR 1.05   Cardiac Enzymes: No results for input(s): CKTOTAL, CKMB, CKMBINDEX, TROPONINI in the last 168 hours. BNP (last 3 results) No results for input(s): PROBNP in the last 8760 hours. HbA1C: No results for input(s): HGBA1C in the last 72 hours. CBG: No results for input(s): GLUCAP in the last 168 hours. Lipid Profile: Recent Labs    06/17/18 0515  CHOL 186  HDL 31*  LDLCALC 139*  TRIG 81  CHOLHDL 6.0   Thyroid Function Tests: Recent Labs    06/17/18 0515  TSH 8.083*   Anemia Panel: No results for input(s): VITAMINB12, FOLATE, FERRITIN, TIBC, IRON, RETICCTPCT in  the last 72 hours. Sepsis Labs: No results for input(s): PROCALCITON, LATICACIDVEN in the last 168 hours.  No results found for this or any previous visit (from the past 240 hour(s)).   Radiology Studies: No results found.  Scheduled Meds: . clotrimazole  1 Applicatorful Vaginal QHS  . enoxaparin (LOVENOX) injection  40 mg Subcutaneous Q24H  . pantoprazole (PROTONIX) IV  40 mg Intravenous Q24H   Continuous Infusions: . dextrose 5 % and 0.45% NaCl 100 mL/hr at 06/19/18 0401     LOS: 3 days   Marylu Lund, MD [facility] Pager On Amion  If 7PM-7AM, please contact night-coverage 06/19/2018, 1:37 PM

## 2018-06-19 NOTE — Progress Notes (Signed)
Patient C/O new pain on her back and mid chest that it's different from what she has been having in the upper abd. Vitals 159/86, 89, 99%-ra, 20, PRN pain med given and oncalled provider Blount-NP notified one time order given for Toradol and patient stated it was effective.  Will continue to F/U with plan of care.

## 2018-06-19 NOTE — Progress Notes (Addendum)
Patient ID: Hailey Houston, female   DOB: Jul 29, 1970, 48 y.o.   MRN: [id number]    Progress Note   Subjective  Mom and sister at bedside  Pt says she had severe pain after ice chips yesterday  Worsened  About 2 hours later and also had pain in mid back - toradol helped  More comfortable this am-  Able to go  close to 4 hours between doses of dilaudid  She does not want to try to drink anything today - has been up ambulating Mostly liquid stool with enema Lipase 88 LFT's continue to trend down    Review of systems: No chest pain or dyspnea.  Anxiety is improved.  Objective   Vital signs in last 24 hours: Temp:  [97.7 F (36.5 C)-98.3 F (36.8 C)] 98.3 F (36.8 C) (02/04 0743) Pulse Rate:  [80-94] 90 (02/04 0743) Resp:  [12-18] 18 (02/04 0743) BP: (126-159)/(73-89) 145/78 (02/04 0757) SpO2:  [95 %-100 %] 99 % (02/04 0743) Last BM Date: 06/16/18 General:    white female in NAD- more comfortable appearing than yesterday  Heart:  Regular rate and rhythm; no murmurs Lungs: Respirations even and unlabored, lungs CTA bilaterally Abdomen:  Soft, tender across upper abdomen  and nondistended. Normal bowel sounds.Faint rash upper abdomen Extremities:  Without edema. Neurologic:  Alert and oriented,  grossly normal neurologically. Psych:  Cooperative. Normal mood and affect.  Intake/Output from previous day: 02/03 0701 - 02/04 0700 In: 1397.8 [I.V.:1397.8] Out: -  Intake/Output this shift: No intake/output data recorded.  Lab Results: Recent Labs    06/16/18 1549 06/17/18 0515  WBC 10.6* 6.2  HGB 10.8* 9.4*  HCT 33.8* 29.9*  PLT 382 348   BMET Recent Labs    06/16/18 1549 06/17/18 0515 06/18/18 0521  NA 136 136 134*  K 3.0* 3.5 3.5  CL 101 106 104  CO2 25 22 23   GLUCOSE 107* 100* 86  BUN 13 12 9   CREATININE 0.69 0.51 0.53  CALCIUM 8.9 8.2* 8.4*   LFT Recent Labs    06/19/18 0554  PROT 6.5  ALBUMIN 3.2*  AST 42*  ALT 139*  ALKPHOS 170*  BILITOT 1.9*    BILIDIR 0.9*  IBILI 1.0*   PT/INR Recent Labs    06/16/18 1901  LABPROT 13.6  INR 1.05        Assessment / Plan:    #1 47 yo WF with acute post ERCP pancreatitis, day # 4 hospital stay  Parameters stable,LFT;s continue trending down   Continued significant pain- requiring Dilaudid,able to increase interval between doses.  Unable to tolerate any Po's at [facility] point  Continue Bowel rest , IV fluids  Until able to handle po's  #2 Biliary obstruction - stable s/p CBD stent 1/30  #3 Duodenal Adenocarcinoma  With ulcerated tumor - likely contributing to pain  Secondary biliary obstruction , and metastatic disease likely with retroperitoneal nodes and multiple lung nodules   Pt needs Chest CT - would do before discharge ,not today  PET scan will need to be ordered outpt Appt with Dr Benay Spice has been scheduled for next week  F/U labs in am  Active Problems:   Adenocarcinoma of duodenum ([place])   Pancreatitis   Hypokalemia   Dehydration   Nausea and vomiting   Acute pancreatitis   Pulmonary nodules     LOS: 3 days   [name]  06/19/2018, 8:51 AM   I have discussed the case with the PA, and that is  the plan I formulated. I personally interviewed and examined the patient.  She looks better today.  She is more comfortable, and her affect is brighter.  She has a good outlook, and is sitting up in bed doing some work on her laptop.  Unfortunately, she still has significant pain even just drinking water requiring IV opioids.  I have reassured her that I believe she is steadily getting better, lipase is improving, and that I expect she will be able to go home soon.  When she can tolerate nutrition with pain that can be managed on oral analgesics, she can be discharged home.  She is requesting that her CT scan of the chest be done prior to discharge.  I will leave this to the primary medical team, but I see no obstacle to that as long as her renal function remains normal,  allowing IV contrast.    [name] Office: [phone]

## 2018-06-20 ENCOUNTER — Inpatient Hospital Stay (HOSPITAL_COMMUNITY): Payer: 59

## 2018-06-20 LAB — CBC
HEMATOCRIT: 27.8 % — AB (ref 36.0–46.0)
Hemoglobin: 9 g/dL — ABNORMAL LOW (ref 12.0–15.0)
MCH: 31.9 pg (ref 26.0–34.0)
MCHC: 32.4 g/dL (ref 30.0–36.0)
MCV: 98.6 fL (ref 80.0–100.0)
Platelets: 344 10*3/uL (ref 150–400)
RBC: 2.82 MIL/uL — ABNORMAL LOW (ref 3.87–5.11)
RDW: 13.5 % (ref 11.5–15.5)
WBC: 4.6 10*3/uL (ref 4.0–10.5)
nRBC: 0 % (ref 0.0–0.2)

## 2018-06-20 LAB — COMPREHENSIVE METABOLIC PANEL
ALT: 112 U/L — ABNORMAL HIGH (ref 0–44)
AST: 43 U/L — ABNORMAL HIGH (ref 15–41)
Albumin: 3 g/dL — ABNORMAL LOW (ref 3.5–5.0)
Alkaline Phosphatase: 155 U/L — ABNORMAL HIGH (ref 38–126)
Anion gap: 8 (ref 5–15)
BUN: 5 mg/dL — ABNORMAL LOW (ref 6–20)
CO2: 26 mmol/L (ref 22–32)
Calcium: 8.5 mg/dL — ABNORMAL LOW (ref 8.9–10.3)
Chloride: 103 mmol/L (ref 98–111)
Creatinine, Ser: 0.51 mg/dL (ref 0.44–1.00)
GFR calc Af Amer: >60 mL/min (ref >60)
Glucose, Bld: 118 mg/dL — ABNORMAL HIGH (ref 70–99)
Potassium: 3.1 mmol/L — ABNORMAL LOW (ref 3.5–5.1)
Sodium: 137 mmol/L (ref 135–145)
Total Bilirubin: 1.6 mg/dL — ABNORMAL HIGH (ref 0.3–1.2)
Total Protein: 6.2 g/dL — ABNORMAL LOW (ref 6.5–8.1)

## 2018-06-20 LAB — LIPASE, BLOOD: Lipase: 94 U/L — ABNORMAL HIGH (ref 11–51)

## 2018-06-20 MED ORDER — IOHEXOL 300 MG/ML  SOLN
75.0000 mL | Freq: Once | INTRAMUSCULAR | Status: AC | PRN
  Administered 2018-06-20: 75 mL via INTRAVENOUS

## 2018-06-20 MED ORDER — KCL IN DEXTROSE-NACL 20-5-0.45 MEQ/L-%-% IV SOLN
INTRAVENOUS | Status: DC
  Administered 2018-06-20 (×2): via INTRAVENOUS
  Filled 2018-06-20 (×3): qty 1000

## 2018-06-20 MED ORDER — BISACODYL 10 MG RE SUPP
10.0000 mg | Freq: Once | RECTAL | Status: DC
  Filled 2018-06-20: qty 1

## 2018-06-20 MED ORDER — DIPHENHYDRAMINE HCL 50 MG PO CAPS
50.0000 mg | ORAL_CAPSULE | Freq: Once | ORAL | Status: AC
  Administered 2018-06-21: 50 mg via ORAL
  Filled 2018-06-20: qty 1

## 2018-06-20 MED ORDER — HYDROCORTISONE 1 % EX CREA: TOPICAL_CREAM | Freq: Three times a day (TID) | CUTANEOUS | Status: DC | PRN

## 2018-06-20 MED ORDER — DIPHENHYDRAMINE HCL 25 MG PO CAPS: 25.0000 mg | ORAL_CAPSULE | ORAL | Status: DC | PRN

## 2018-06-20 MED ORDER — SODIUM CHLORIDE (PF) 0.9 % IJ SOLN
INTRAMUSCULAR | Status: AC
  Filled 2018-06-20: qty 200

## 2018-06-20 NOTE — Progress Notes (Signed)
  Oncology Nurse Navigator Documentation   Met with patient on unit to provide information on colon cancer and potential treatments, Port-A-Cath,  [place], and support groups available at [facility]. Will continue to follow and offer support.

## 2018-06-20 NOTE — Progress Notes (Signed)
PROGRESS NOTE  Hailey Houston  IRW:[id number] DOB: 1971/04/24 DOA: 06/16/2018 PCP: Patient, No Pcp Per   Brief Narrative: 48 y.o.femalewith medical history significant of ADHD who presented 2/1 with post-ERCP pancreatitis. ERCP was on 1/30 in investigation of 1 month of nausea, decreased appetite, abdominal pain and weight loss with elevated LFTs and pancreatic head mass with intr- and extra-hepatic ductal dilatation. There was some concern for metastatic spread with pulmonary nodules and porta hepatis gastrohepatic area and retroperitoneal lymphadenopathy. ERCP found severely abnormal duodenum causing a distal CBD stricture she had a limited biliary sphincterectomy and balloon dilatation. Biopsies were obtained, and biliary stent placed. Since admission diet has been restricted and IVF's given with GI consulted.   Assessment & Plan: Active Problems:   Adenocarcinoma of duodenum (HCC)   Pancreatitis   Hypokalemia   Dehydration   Nausea and vomiting   Acute pancreatitis   Pulmonary nodules  Adenocarcinoma of duodenum: with suspected pulmonary metastases.  - Oncology, Dr. Benay Spice consulted. Ordered CT chest for staging.   Post-ERCP pancreatitis:  - Appreciate GI recommendations regarding diet. Encouraged pt to attempt liquids today.  - Continue supportive care with IVF, analgesics, etc.   Hypokalemia:  - Supplement prn  Dehydration:  - Continue IVF  DVT prophylaxis: Lovenox subQ Code Status: Full Family Communication: None at bedside Disposition Plan: Uncertain at this time  Consultants:   GI  Oncology  Procedures:   None  Antimicrobials:  None   Subjective: Abdominal pain is severe, described as fireworks going off inside the belly, dull and sharp, radiating to both sides from the upper abdomen described as karate chops.   Objective: Vitals:   06/19/18 1322 06/19/18 2126 06/20/18 0451 06/20/18 1346  BP: (!) 145/89 (!) 152/84 134/80 (!) 142/80  Pulse: 93 94  83 95  Resp: 17 20 16 18   Temp: 98.9 F (37.2 C) 98.5 F (36.9 C) 98.4 F (36.9 C) 98.5 F (36.9 C)  TempSrc: Oral Oral Oral Oral  SpO2: 99% 93% 94% 93%  Weight:      Height:        Intake/Output Summary (Last 24 hours) at 06/20/2018 2034 Last data filed at 06/20/2018 0700 Gross per 24 hour  Intake 1593.79 ml  Output -  Net 1593.79 ml   Filed Weights   06/17/18 0049  Weight: 105.3 kg    Gen: 48 y.o. female in no distress  Pulm: Non-labored breathing. Clear to auscultation bilaterally.  CV: Regular rate and rhythm. No murmur, rub, or gallop. No JVD, no pedal edema. GI: Abdomen soft, diffusely tender to minimal palpation worst in epigastrium, +BS. No LLQ tenderness or stool burden palpated. Ext: Warm, no deformities Skin: Maculopapular rash under breast, upper abdomen and on back  Neuro: Alert and oriented. No focal neurological deficits. Psych: Judgement and insight appear normal. Mood & affect appropriate.   Data Reviewed: I have personally reviewed following labs and imaging studies  CBC: Recent Labs  Lab 06/14/18 0930 06/16/18 1549 06/17/18 0515 06/20/18 0558  WBC 6.3 10.6* 6.2 4.6  NEUTROABS  --  7.7  --   --   HGB 11.2* 10.8* 9.4* 9.0*  HCT 34.4* 33.8* 29.9* 27.8*  MCV 95.8 98.5 98.4 98.6  PLT 394 382 348 761   Basic Metabolic Panel: Recent Labs  Lab 06/14/18 0930 06/16/18 1549 06/17/18 0515 06/18/18 0521 06/20/18 0558  NA 137 136 136 134* 137  K 3.7 3.0* 3.5 3.5 3.1*  CL 106 101 106 104 103  CO2 23 25  22 23 26   GLUCOSE 113* 107* 100* 86 118*  BUN 11 13 12 9  5*  CREATININE 0.61 0.69 0.51 0.53 0.51  CALCIUM 9.3 8.9 8.2* 8.4* 8.5*  MG  --  1.9 1.8  --   --   PHOS  --  3.7 3.9  --   --    GFR: Estimated Creatinine Clearance: 118.6 mL/min (by C-G formula based on SCr of 0.51 mg/dL). Liver Function Tests: Recent Labs  Lab 06/16/18 1549 06/17/18 0515 06/18/18 0521 06/19/18 0554 06/20/18 0558  AST 118* 69* 48* 42* 43*  ALT 322* 228* 176* 139*  112*  ALKPHOS 278* 207* 186* 170* 155*  BILITOT 2.8* 2.7* 1.9* 1.9* 1.6*  PROT 7.6 6.4* 6.3* 6.5 6.2*  ALBUMIN 4.0 3.4* 3.2* 3.2* 3.0*   Recent Labs  Lab 06/16/18 1549 06/17/18 0515 06/18/18 0521 06/19/18 0554 06/20/18 0558  LIPASE 348* 204* 102* 88* 94*   No results for input(s): AMMONIA in the last 168 hours. Coagulation Profile: Recent Labs  Lab 06/16/18 1901  INR 1.05   Cardiac Enzymes: No results for input(s): CKTOTAL, CKMB, CKMBINDEX, TROPONINI in the last 168 hours. BNP (last 3 results) No results for input(s): PROBNP in the last 8760 hours. HbA1C: No results for input(s): HGBA1C in the last 72 hours. CBG: No results for input(s): GLUCAP in the last 168 hours. Lipid Profile: No results for input(s): CHOL, HDL, LDLCALC, TRIG, CHOLHDL, LDLDIRECT in the last 72 hours. Thyroid Function Tests: No results for input(s): TSH, T4TOTAL, FREET4, T3FREE, THYROIDAB in the last 72 hours. Anemia Panel: No results for input(s): VITAMINB12, FOLATE, FERRITIN, TIBC, IRON, RETICCTPCT in the last 72 hours. Urine analysis: No results found for: COLORURINE, APPEARANCEUR, LABSPEC, PHURINE, GLUCOSEU, HGBUR, BILIRUBINUR, KETONESUR, PROTEINUR, UROBILINOGEN, NITRITE, LEUKOCYTESUR No results found for this or any previous visit (from the past 240 hour(s)).    Radiology Studies: Ct Chest W Contrast  Result Date: 06/20/2018 CLINICAL DATA:  Malignant biliary obstruction status post ERCP and common bile duct stent placement 06/14/2018. Large ulcerated duodenal mass was identified. Evaluate for possible lung nodules. EXAM: CT CHEST WITH CONTRAST TECHNIQUE: Multidetector CT imaging of the chest was performed during intravenous contrast administration. CONTRAST:  70mL OMNIPAQUE IOHEXOL 300 MG/ML  SOLN COMPARISON:  CT abdomen 06/16/2018 FINDINGS: Cardiovascular: The heart is normal in size. No pericardial effusion. The aorta is normal in caliber. No dissection. The branch vessels are patent. No definite  coronary artery calcifications. Mediastinum/Nodes: 9.5 mm right subclavicular lymph node adjacent to the left thyroid lobe is suspicious for malignant adenopathy. Slightly more inferiorly is an 8 mm node between the left carotid artery and the left subclavian artery. Mediastinal lymphadenopathy, worrisome for metastatic disease. 10 mm right hilar node on image number 61. 10 mm subcarinal lymph node on image number 62. 7 mm right paratracheal node on image number 49. The esophagus is grossly normal. Lungs/Pleura: Numerous bilateral pulmonary nodules consistent with metastatic disease. Some of these demonstrate mild cavitation. Index node in the right upper lobe on image number 64 measures 9.5 mm. Index node in the left lower lobe on image number 120 measures 8 mm. 7 mm right lower lobe on image number 75. No acute pulmonary findings.  No pleural effusion. Upper Abdomen: There is a common bile duct stent in place. There is New significant inflammatory change involving the pancreas and peripancreatic tissues suggesting acute pancreatitis. Is also inflammation and fluid extending into the periportal region and into the anterior pararenal fossa bilaterally. High attenuation lesions in the  gallbladder, likely gallstones. Pneumobilia associated with the common bile duct stent. Musculoskeletal: No breast masses, chest wall mass or axillary adenopathy. IMPRESSION: 1. Diffuse metastatic pulmonary nodules in both lungs. 2. Subclavicular, mediastinal and hilar lymph nodes suspicious for metastatic disease. 3. Acute pancreatitis. Poor enhancement of the pancreas could suggest pancreatic necrosis. These results will be called to the ordering clinician or representative by the Radiologist Assistant, and communication documented in the PACS or zVision Dashboard. Electronically Signed   By: Marijo Sanes M.D.   On: 06/20/2018 17:59    Scheduled Meds: . bisacodyl  10 mg Rectal Once  . clotrimazole  1 Applicatorful Vaginal QHS    . enoxaparin (LOVENOX) injection  40 mg Subcutaneous Q24H  . pantoprazole (PROTONIX) IV  40 mg Intravenous Q24H  . sodium chloride (PF)       Continuous Infusions: . dextrose 5 % and 0.45 % NaCl with KCl 20 mEq/L 100 mL/hr at 06/20/18 0825     LOS: 4 days   Time spent: 25 minutes.  Patrecia Pour, MD [facility] [email or web address] Password [facility] 06/20/2018, 8:34 PM

## 2018-06-20 NOTE — Progress Notes (Addendum)
Patient ID: Hailey Houston, female   DOB: 11-01-1970, 48 y.o.   MRN: [id number]     Progress Note   Subjective    Looks comfortable, doing work on laptop. Pain persists , well controlled at present - willing to try some clears . Good atittude  titiude about her upcoming battle  Dr Benay Spice was in this am, wants her to get chest Ct today Lipase 94  Objective   Vital signs in last 24 hours: Temp:  [98.4 F (36.9 C)-98.9 F (37.2 C)] 98.4 F (36.9 C) (02/05 0451) Pulse Rate:  [83-94] 83 (02/05 0451) Resp:  [16-20] 16 (02/05 0451) BP: (134-152)/(80-89) 134/80 (02/05 0451) SpO2:  [93 %-99 %] 94 % (02/05 0451) Last BM Date: 06/13/18 General:    white female in NAD Heart:  Regular rate and rhythm; no murmurs Lungs: Respirations even and unlabored, lungs CTA bilaterally Abdomen:  Soft, tender upper abdomen,and nondistended. Normal bowel sounds. Extremities:  Without edema. Neurologic:  Alert and oriented,  grossly normal neurologically. Psych:  Cooperative. Normal mood and affect.  Intake/Output from previous day: 02/04 0701 - 02/05 0700 In: 2223.9 [I.V.:2223.9] Out: -  Intake/Output this shift: No intake/output data recorded.  Lab Results: Recent Labs    06/20/18 0558  WBC 4.6  HGB 9.0*  HCT 27.8*  PLT 344   BMET Recent Labs    06/18/18 0521 06/20/18 0558  NA 134* 137  K 3.5 3.1*  CL 104 103  CO2 23 26  GLUCOSE 86 118*  BUN 9 5*  CREATININE 0.53 0.51  CALCIUM 8.4* 8.5*   LFT Recent Labs    06/19/18 0554 06/20/18 0558  PROT 6.5 6.2*  ALBUMIN 3.2* 3.0*  AST 42* 43*  ALT 139* 112*  ALKPHOS 170* 155*  BILITOT 1.9* 1.6*  BILIDIR 0.9*  --   IBILI 1.0*  --    PT/INR No results for input(s): LABPROT, INR in the last 72 hours.      Assessment / Plan:    #1 48 yo female with post ERCP pancreatitis, clinically resolving, with no worrisome parameters. #2.  Malignant biliary obstruction that is post ERCP and common bile duct stent placement on  06/14/2018, with finding of a large ulcerated duodenal mass, partially obstructing the bile duct.  Biopsies positive for adenocarcinoma.  Patient very likely has metastatic disease with retroperitoneal nodes and multiple lung nodules.  #3 obstipation secondary to #1 #4 anxiety-situational  Plan; patient has been very reluctant to try any p.o.'s since admission.  She is agreeable to start clear liquid diet today and can start with sips of what ever sounds appealing. She understands that ability to keep down clear to full liquids, will allow her to be discharged home. If she is able to keep down some liquids, would then work to transition to oral pain meds.  She may require MS Contin. We will order Dulcolax suppository for tomorrow a.m., no oral laxatives she is feeling much better.  Will order CT of the chest today. Hopefully she will be able to be discharged in the next 24 to 48 hours   Active Problems:   Adenocarcinoma of duodenum (HCC)   Pancreatitis   Hypokalemia   Dehydration   Nausea and vomiting   Acute pancreatitis   Pulmonary nodules     LOS: 4 days   [name]  06/20/2018, 9:36 AM   I have discussed the case with the PA, and that is the plan I formulated.   CC: Post ERCP pancreatitis  She is clinically improved.  Believe her pancreatitis is resolved.  We have tried to reassure her that postprandial pain is to be expected with the underlying malignancy.  When the pain is under reasonable control on oral opioids, she can be discharged home to follow-up with oncology as scheduled.  CT scan of the chest with contrast for staging per oncology will be ordered.    [name] Office: [phone]

## 2018-06-21 ENCOUNTER — Inpatient Hospital Stay: Payer: Self-pay

## 2018-06-21 DIAGNOSIS — G8929 Other chronic pain: Secondary | ICD-10-CM

## 2018-06-21 DIAGNOSIS — R1013 Epigastric pain: Secondary | ICD-10-CM

## 2018-06-21 DIAGNOSIS — R918 Other nonspecific abnormal finding of lung field: Secondary | ICD-10-CM

## 2018-06-21 DIAGNOSIS — D6489 Other specified anemias: Secondary | ICD-10-CM

## 2018-06-21 DIAGNOSIS — K859 Acute pancreatitis without necrosis or infection, unspecified: Secondary | ICD-10-CM

## 2018-06-21 LAB — CBC
HCT: 28.8 % — ABNORMAL LOW (ref 36.0–46.0)
Hemoglobin: 9.3 g/dL — ABNORMAL LOW (ref 12.0–15.0)
MCH: 31.5 pg (ref 26.0–34.0)
MCHC: 32.3 g/dL (ref 30.0–36.0)
MCV: 97.6 fL (ref 80.0–100.0)
Platelets: 395 10*3/uL (ref 150–400)
RBC: 2.95 MIL/uL — ABNORMAL LOW (ref 3.87–5.11)
RDW: 13.2 % (ref 11.5–15.5)
WBC: 4.5 10*3/uL (ref 4.0–10.5)
nRBC: 0 % (ref 0.0–0.2)

## 2018-06-21 LAB — COMPREHENSIVE METABOLIC PANEL
ALT: 101 U/L — ABNORMAL HIGH (ref 0–44)
AST: 49 U/L — ABNORMAL HIGH (ref 15–41)
Albumin: 3.2 g/dL — ABNORMAL LOW (ref 3.5–5.0)
Alkaline Phosphatase: 149 U/L — ABNORMAL HIGH (ref 38–126)
Anion gap: 8 (ref 5–15)
BUN: <5 mg/dL — ABNORMAL LOW (ref 6–20)
CO2: 26 mmol/L (ref 22–32)
Calcium: 8.4 mg/dL — ABNORMAL LOW (ref 8.9–10.3)
Chloride: 103 mmol/L (ref 98–111)
Creatinine, Ser: 0.47 mg/dL (ref 0.44–1.00)
GFR calc non Af Amer: >60 mL/min (ref >60)
Glucose, Bld: 124 mg/dL — ABNORMAL HIGH (ref 70–99)
Potassium: 2.7 mmol/L — CL (ref 3.5–5.1)
Sodium: 137 mmol/L (ref 135–145)
TOTAL PROTEIN: 6.5 g/dL (ref 6.5–8.1)
Total Bilirubin: 1.8 mg/dL — ABNORMAL HIGH (ref 0.3–1.2)

## 2018-06-21 LAB — PHOSPHORUS: Phosphorus: 3.9 mg/dL (ref 2.5–4.6)

## 2018-06-21 MED ORDER — KCL IN DEXTROSE-NACL 40-5-0.45 MEQ/L-%-% IV SOLN
INTRAVENOUS | Status: AC
  Administered 2018-06-21 – 2018-06-22 (×4): via INTRAVENOUS
  Filled 2018-06-21 (×4): qty 1000

## 2018-06-21 MED ORDER — HYOSCYAMINE SULFATE 0.125 MG SL SUBL
0.2500 mg | SUBLINGUAL_TABLET | Freq: Once | SUBLINGUAL | Status: DC
  Filled 2018-06-21: qty 2

## 2018-06-21 MED ORDER — SODIUM CHLORIDE 0.9% FLUSH
10.0000 mL | INTRAVENOUS | Status: DC | PRN
  Administered 2018-06-23: 10 mL
  Administered 2018-06-28: 20 mL
  Administered 2018-06-30: 10 mL
  Filled 2018-06-21 (×3): qty 40

## 2018-06-21 MED ORDER — TRIAMCINOLONE ACETONIDE 0.1 % EX CREA
TOPICAL_CREAM | Freq: Three times a day (TID) | CUTANEOUS | Status: DC
  Administered 2018-06-21: 17:00:00 via TOPICAL
  Administered 2018-06-21: 1 via TOPICAL
  Administered 2018-06-22 – 2018-06-23 (×5): via TOPICAL
  Administered 2018-06-23: 1 via TOPICAL
  Administered 2018-06-24 – 2018-06-25 (×4): via TOPICAL
  Administered 2018-06-25: 1 via TOPICAL
  Administered 2018-06-25 – 2018-06-30 (×12): via TOPICAL
  Filled 2018-06-21 (×2): qty 15

## 2018-06-21 MED ORDER — ALUM & MAG HYDROXIDE-SIMETH 200-200-20 MG/5ML PO SUSP
30.0000 mL | Freq: Once | ORAL | Status: AC
  Administered 2018-06-21: 30 mL via ORAL
  Filled 2018-06-21: qty 30

## 2018-06-21 MED ORDER — POTASSIUM CHLORIDE 10 MEQ/100ML IV SOLN
10.0000 meq | INTRAVENOUS | Status: DC
  Administered 2018-06-21 (×3): 10 meq via INTRAVENOUS
  Filled 2018-06-21 (×3): qty 100

## 2018-06-21 MED ORDER — HYDROMORPHONE HCL 2 MG PO TABS
4.0000 mg | ORAL_TABLET | ORAL | Status: DC | PRN
  Administered 2018-06-21 – 2018-06-23 (×2): 4 mg via ORAL
  Filled 2018-06-21 (×3): qty 2

## 2018-06-21 MED ORDER — ENOXAPARIN SODIUM 40 MG/0.4ML ~~LOC~~ SOLN
40.0000 mg | SUBCUTANEOUS | Status: DC
  Administered 2018-06-22 – 2018-06-29 (×8): 40 mg via SUBCUTANEOUS
  Filled 2018-06-21 (×8): qty 0.4

## 2018-06-21 MED ORDER — POTASSIUM CHLORIDE CRYS ER 20 MEQ PO TBCR: 40.0000 meq | EXTENDED_RELEASE_TABLET | ORAL | Status: DC

## 2018-06-21 MED ORDER — HYOSCYAMINE SULFATE 0.5 MG/ML IJ SOLN
0.2500 mg | Freq: Once | INTRAMUSCULAR | Status: DC
  Filled 2018-06-21: qty 0.5

## 2018-06-21 MED ORDER — LIDOCAINE VISCOUS HCL 2 % MT SOLN
15.0000 mL | Freq: Once | OROMUCOSAL | Status: AC
  Administered 2018-06-21: 15 mL via ORAL
  Filled 2018-06-21: qty 15

## 2018-06-21 MED ORDER — MAGNESIUM SULFATE 2 GM/50ML IV SOLN
2.0000 g | Freq: Once | INTRAVENOUS | Status: AC
  Administered 2018-06-21: 2 g via INTRAVENOUS
  Filled 2018-06-21: qty 50

## 2018-06-21 MED ORDER — DIPHENHYDRAMINE HCL 50 MG/ML IJ SOLN
25.0000 mg | Freq: Four times a day (QID) | INTRAMUSCULAR | Status: DC | PRN
  Administered 2018-06-22 (×2): 25 mg via INTRAVENOUS
  Filled 2018-06-21 (×2): qty 1

## 2018-06-21 NOTE — Progress Notes (Signed)
Peripherally Inserted Central Catheter/Midline Placement  The IV Nurse has discussed with the patient and/or persons authorized to consent for the patient, the purpose of this procedure and the potential benefits and risks involved with this procedure.  The benefits include less needle sticks, lab draws from the catheter, and the patient may be discharged home with the catheter. Risks include, but not limited to, infection, bleeding, blood clot (thrombus formation), and puncture of an artery; nerve damage and irregular heartbeat and possibility to perform a PICC exchange if needed/ordered by physician.  Alternatives to this procedure were also discussed.  Bard Power PICC patient education guide, fact sheet on infection prevention and patient information card has been provided to patient /or left at bedside.    PICC/Midline Placement Documentation  PICC Double Lumen 06/21/18 PICC Right Brachial 38 cm 1 cm (Active)  Indication for Insertion or Continuance of Line Administration of hyperosmolar/irritating solutions (i.e. TPN, Vancomycin, etc.) 06/21/2018  4:09 PM  Exposed Catheter (cm) 1 cm 06/21/2018  4:09 PM  Site Assessment Clean;Intact;Dry 06/21/2018  4:09 PM  Lumen #1 Status Flushed;Blood return noted;Saline locked 06/21/2018  4:09 PM  Lumen #2 Status Flushed;Blood return noted;Saline locked 06/21/2018  4:09 PM  Dressing Type Transparent 06/21/2018  4:09 PM  Dressing Status Clean;Dry;Intact;Antimicrobial disc in place 06/21/2018  4:09 PM  Dressing Change Due 06/28/18 06/21/2018  4:09 PM       [address] 06/21/2018, 4:10 PM

## 2018-06-21 NOTE — Progress Notes (Signed)
PHARMACY - ADULT TOTAL PARENTERAL NUTRITION CONSULT NOTE   Pharmacy Consult for TPN Indication: Severe pancreatitis  Patient Measurements: Height: 5\' 11"  (180.3 cm) Weight: 232 lb 3.2 oz (105.3 kg) IBW/kg (Calculated) : 70.8   Body mass index is 32.39 kg/m. Usual Weight: Most recent weight 105 kg on 2/2  Assessment:   Insulin Requirements: n/a  Current Nutrition: NPO  IVF: D5 1/2NS with KCl 40 mEq/L at 100 mL/hr per attending MD  Central access: No central line access at this time. PICC line ordered - will likely be placed this evening TPN start date: Anticipate starting 2/7 pending PICC line placement.   ASSESSMENT                                                                                              HPI: Pt has PMH significant for 1 month of nausea, decreased appetite, abdominal pain, and weight loss. Underwent ERCP and biliary stent placement on 1/30 with finding of a large ulcerated duodenal mass. Pt presented on 2/1 with post-ERCP pancreatitis. Pt has been unable to tolerate PO intake during admission. Pharmacy consulted today regarding initiation of TPN.  Significant events:  -1/30 ERCP with biliary stent placement  Today:    Glucose - FBG 124. Pt does not have DM.  Electrolytes -   Na, Cl, Phos, Corrected Ca - WNL  K (2.7) - low which is expected with decreased PO intake. Being replaced with KCl 10 mEq IV x 3 runs as well as maintenance fluids with K per MD.  Renal - SCr stable, WNL  LFTs - Slightly elevated but trending down (AST 49, ALT 101)  TGs - WNL (81 on 2/2)  Prealbumin -  NUTRITIONAL GOALS                                                                                 RD recs: -Have entered RD consult for TPN -Await RD recommendations for nutritional goals  PLAN                                                                                                                MD replaced electrolytes today  Since pt does not have central  line access and consult was received after 12:00, TPN will start on 2/7 pending PICC line placement.   TPN lab panel  ordered with AM labs tomorrow  Lenis Noon, PharmD 06/21/18 2:01 PM

## 2018-06-21 NOTE — Progress Notes (Signed)
IP PROGRESS NOTE  Subjective:   Ms. Podolak continues to have abdominal pain.  IV Dilaudid helps, but is short lasting.  She had nausea and some pain prior to the ERCP procedure, but reports the pain is more "acute ".  Objective: Vital signs in last 24 hours: Blood pressure 134/76, pulse 83, temperature 98 F (36.7 C), temperature source Oral, resp. rate 14, height 5\' 11"  (1.803 m), weight 232 lb 3.2 oz (105.3 kg), SpO2 99 %.  Intake/Output from previous day: 02/05 0701 - 02/06 0700 In: 1802.9 [P.O.:120; I.V.:1682.9] Out: -   Physical Exam:  Lymph nodes: No supraclavicular node Abdomen: Soft, tender in the mid and left upper abdomen, no mass    Lab Results: Recent Labs    06/20/18 0558 06/21/18 0523  WBC 4.6 4.5  HGB 9.0* 9.3*  HCT 27.8* 28.8*  PLT 344 395    BMET Recent Labs    06/20/18 0558 06/21/18 0523  NA 137 137  K 3.1* 2.7*  CL 103 103  CO2 26 26  GLUCOSE 118* 124*  BUN 5* <5*  CREATININE 0.51 0.47  CALCIUM 8.5* 8.4*    No results found for: CEA1  Studies/Results: Ct Chest W Contrast  Result Date: 06/20/2018 CLINICAL DATA:  Malignant biliary obstruction status post ERCP and common bile duct stent placement 06/14/2018. Large ulcerated duodenal mass was identified. Evaluate for possible lung nodules. EXAM: CT CHEST WITH CONTRAST TECHNIQUE: Multidetector CT imaging of the chest was performed during intravenous contrast administration. CONTRAST:  77mL OMNIPAQUE IOHEXOL 300 MG/ML  SOLN COMPARISON:  CT abdomen 06/16/2018 FINDINGS: Cardiovascular: The heart is normal in size. No pericardial effusion. The aorta is normal in caliber. No dissection. The branch vessels are patent. No definite coronary artery calcifications. Mediastinum/Nodes: 9.5 mm right subclavicular lymph node adjacent to the left thyroid lobe is suspicious for malignant adenopathy. Slightly more inferiorly is an 8 mm node between the left carotid artery and the left subclavian artery. Mediastinal  lymphadenopathy, worrisome for metastatic disease. 10 mm right hilar node on image number 61. 10 mm subcarinal lymph node on image number 62. 7 mm right paratracheal node on image number 49. The esophagus is grossly normal. Lungs/Pleura: Numerous bilateral pulmonary nodules consistent with metastatic disease. Some of these demonstrate mild cavitation. Index node in the right upper lobe on image number 64 measures 9.5 mm. Index node in the left lower lobe on image number 120 measures 8 mm. 7 mm right lower lobe on image number 75. No acute pulmonary findings.  No pleural effusion. Upper Abdomen: There is a common bile duct stent in place. There is New significant inflammatory change involving the pancreas and peripancreatic tissues suggesting acute pancreatitis. Is also inflammation and fluid extending into the periportal region and into the anterior pararenal fossa bilaterally. High attenuation lesions in the gallbladder, likely gallstones. Pneumobilia associated with the common bile duct stent. Musculoskeletal: No breast masses, chest wall mass or axillary adenopathy. IMPRESSION: 1. Diffuse metastatic pulmonary nodules in both lungs. 2. Subclavicular, mediastinal and hilar lymph nodes suspicious for metastatic disease. 3. Acute pancreatitis. Poor enhancement of the pancreas could suggest pancreatic necrosis. These results will be called to the ordering clinician or representative by the Radiologist Assistant, and communication documented in the PACS or zVision Dashboard. Electronically Signed   By: Marijo Sanes M.D.   On: 06/20/2018 17:59   [place] Ekg Site Rite  Result Date: 06/21/2018 If Site Rite image not attached, placement could not be confirmed due to current cardiac rhythm.  Medications: I have reviewed the patient's current medications.  Assessment/Plan: 1.  Adenocarcinoma of the duodenum  CT abdomen/pelvis 06/16/2018- changes of pancreatitis, fullness at the head of the pancreas without a defined  mass, multiple subcentimeter peripancreatic lymph nodes, distal common bile duct stent, multiple nodules at the lung bases  EUS 06/14/2018-ulcerated mucosa/mass at the distal duodenal bulb with extension to the pancreas head, portal adenopathy, biopsy of duodenal mass-adenocarcinoma  ERCP 06/14/2018- distal duodenal bulb mass, 1 cm proximal to the ampulla causing biliary obstruction, status post placement of a metal common bile duct stent  CT chest 06/20/2018- bilateral pulmonary nodules consistent with metastatic disease, supraclavicular, mediastinal, and hilar lymphadenopathy, changes of pancreatitis  2.  Biliary obstruction secondary to the duodenal mass, status post placement of a bile duct stent 06/14/2018 3.  Post ERCP pancreatitis 06/16/2018 4.  Anemia secondary to phlebotomy and multiple procedures 5.  G2, P2 6.  Nausea secondary to #1 7.  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  I discussed the chest CT findings with her.  I recommend proceeding with a diagnostic biopsy to confirm metastatic disease.  The left supraclavicular node may be accessible to a needle biopsy.  If not she can undergo a bronchoscopy for biopsy of the mediastinal node.  She will try oral Dilaudid for pain.  Molecular testing is pending on the duodenal biopsy.  Outpatient follow-up is scheduled at the Cancer center for next week.  Recommendations: 1.  Management of pancreatitis per gastroenterology 2.  Consult interventional radiology consider biopsy of the left supraclavicular lymph node 3.  Trial of oral Dilaudid for pain   LOS: 5 days   Betsy Coder, MD   06/21/2018, 2:04 PM

## 2018-06-21 NOTE — Progress Notes (Addendum)
Patient ID: Hailey Houston, female   DOB: 1970/07/07, 48 y.o.   MRN: [id number]     Progress Note   Subjective    Pt has copy of CT from yesterday , Dr Benay Spice saw her this am  and reviewed lung findings - apparently mentioned another  bx .   Still unable to eat ,anything she puts in her system makes her worse . She is frustrated , feel like we are pushing her towards home and she doesn't feel any better than when she came in  Rash on abdomen which she had prior to admit is worse and has spread - itchy and painful    Objective   Vital signs in last 24 hours: Temp:  [97.7 F (36.5 C)-98.6 F (37 C)] 97.7 F (36.5 C) (02/06 0437) Pulse Rate:  [78-121] 78 (02/06 0530) Resp:  [16-18] 16 (02/06 0530) BP: (136-183)/(73-93) 140/82 (02/06 0530) SpO2:  [93 %-99 %] 96 % (02/06 0530) Last BM Date: 06/13/18 General:    white female in NAD Heart:  Regular rate and rhythm; no murmurs Lungs: Respirations even and unlabored, lungs CTA bilaterally Abdomen:  Soft,tender upper abdomen,  nondistended. Normal bowel sounds. Extremities:  Without edema. Neurologic:  Alert and oriented,  grossly normal neurologically. Psych:  Cooperative. Normal mood and affect.  Intake/Output from previous day: 02/05 0701 - 02/06 0700 In: 1802.9 [P.O.:120; I.V.:1682.9] Out: -  Intake/Output this shift: Total I/O In: 120 [P.O.:120] Out: -   Lab Results: Recent Labs    06/20/18 0558 06/21/18 0523  WBC 4.6 4.5  HGB 9.0* 9.3*  HCT 27.8* 28.8*  PLT 344 395   BMET Recent Labs    06/20/18 0558 06/21/18 0523  NA 137 137  K 3.1* 2.7*  CL 103 103  CO2 26 26  GLUCOSE 118* 124*  BUN 5* <5*  CREATININE 0.51 0.47  CALCIUM 8.5* 8.4*   LFT Recent Labs    06/19/18 0554  06/21/18 0523  PROT 6.5   < > 6.5  ALBUMIN 3.2*   < > 3.2*  AST 42*   < > 49*  ALT 139*   < > 101*  ALKPHOS 170*   < > 149*  BILITOT 1.9*   < > 1.8*  BILIDIR 0.9*  --   --   IBILI 1.0*  --   --    < > = values in this interval  not displayed.   PT/INR No results for input(s): LABPROT, INR in the last 72 hours.  Studies/Results: Ct Chest W Contrast  Result Date: 06/20/2018 CLINICAL DATA:  Malignant biliary obstruction status post ERCP and common bile duct stent placement 06/14/2018. Large ulcerated duodenal mass was identified. Evaluate for possible lung nodules. EXAM: CT CHEST WITH CONTRAST TECHNIQUE: Multidetector CT imaging of the chest was performed during intravenous contrast administration. CONTRAST:  42mL OMNIPAQUE IOHEXOL 300 MG/ML  SOLN COMPARISON:  CT abdomen 06/16/2018 FINDINGS: Cardiovascular: The heart is normal in size. No pericardial effusion. The aorta is normal in caliber. No dissection. The branch vessels are patent. No definite coronary artery calcifications. Mediastinum/Nodes: 9.5 mm right subclavicular lymph node adjacent to the left thyroid lobe is suspicious for malignant adenopathy. Slightly more inferiorly is an 8 mm node between the left carotid artery and the left subclavian artery. Mediastinal lymphadenopathy, worrisome for metastatic disease. 10 mm right hilar node on image number 61. 10 mm subcarinal lymph node on image number 62. 7 mm right paratracheal node on image number 49. The esophagus is grossly  normal. Lungs/Pleura: Numerous bilateral pulmonary nodules consistent with metastatic disease. Some of these demonstrate mild cavitation. Index node in the right upper lobe on image number 64 measures 9.5 mm. Index node in the left lower lobe on image number 120 measures 8 mm. 7 mm right lower lobe on image number 75. No acute pulmonary findings.  No pleural effusion. Upper Abdomen: There is a common bile duct stent in place. There is New significant inflammatory change involving the pancreas and peripancreatic tissues suggesting acute pancreatitis. Is also inflammation and fluid extending into the periportal region and into the anterior pararenal fossa bilaterally. High attenuation lesions in the  gallbladder, likely gallstones. Pneumobilia associated with the common bile duct stent. Musculoskeletal: No breast masses, chest wall mass or axillary adenopathy. IMPRESSION: 1. Diffuse metastatic pulmonary nodules in both lungs. 2. Subclavicular, mediastinal and hilar lymph nodes suspicious for metastatic disease. 3. Acute pancreatitis. Poor enhancement of the pancreas could suggest pancreatic necrosis. These results will be called to the ordering clinician or representative by the Radiologist Assistant, and communication documented in the PACS or zVision Dashboard. Electronically Signed   By: Marijo Sanes M.D.   On: 06/20/2018 17:59       Assessment / Plan:    #1 48 yo female what appears to be widely metastatic adenocarcinoma  With duodenal primary- large ulcerated duodenal mass  Not obstructing duodenum but caused bilary obstruction   Now s/p ERCP and stent  4/26 , complicated by post ERCP pancreatitis which has been slow to resolve   Diffuse pulmonary metastatic nodules , and mediastinal adenopathy   Repeat imaging yesterday (of chest ) still showing significant pancreatitis with poor enhancement of pancreas   She has been unable to take any po's  Over the past week - at this point I think pancreatitis   any duodenal mass both contributing. Do not think she would tolerate postpyloric tube well, and would be sitting at the area of stent and ulcerated mass.  I discussed PICC  and TPN with her today which I believe we need to proceed with.  #2 Hypokalemia - correcting  #3 anemia -stable #4 Rash - progressive - etiology not clear - increase IV benadryl, start benadryl cream  ?malignancy related   Plan; proceed with PICC and TPN Continue IV pain meds- ? duragesic patch for home If pt is going to get a Port - will discuss with Oncology -  Regarding ability to use for TPN and chemo Continue IV fluids, electrolyte replacement, check mg and phos   Active Problems:   Adenocarcinoma of  duodenum (HCC)   Pancreatitis   Hypokalemia   Dehydration   Nausea and vomiting   Acute pancreatitis   Pulmonary nodules     LOS: 5 days   [name]  06/21/2018, 10:42 AM   I have discussed the case with the PA, and that is the plan I formulated.  I was unable to see the patient today because she was having a PICC line placed at the time I rounded on her.  CC: Post ERCP pancreatitis  Her pain is very slow to improve.  She is very concerned about going home, so PICC line was placed.  TPN will be started.  She has probable metastatic disease on CT of the chest and oncology plans a possible biopsy of target node.  We will follow the patient, though I feel that at this point we have little else to add other than supportive care with analgesics, antiemetics and nutritional  support.   [name] Office: [phone]

## 2018-06-21 NOTE — Consult Note (Signed)
Chief Complaint: Patient was seen in consultation today for left supraclavicular lymphadenopathy.  Referring Physician(s): Patrecia Pour  Supervising Physician: Arne Cleveland  Patient Status: [facility] - In-pt  History of Present Illness: Hailey Houston is a 48 y.o. female with a past medical history of hypertension, HF, collagen vascular disease, asthma, adenocarcinoma of duodenum, renal insufficiency, multiple myeloma, sickle cell anemia, diabetes mellitus, anxiety, and ADD. On 06/14/2018, she underwent ERCP with biopsy, sphincterotomy, biliary stent placement, and biliary duct dilation, with Dr. Ardis Hughs 06/14/2018 for a mass that was diagnosed as adenocarcinoma. She had intractable abdominal pain following procedure, so she went to [facility] ED 06/16/2018 for further management. She was found to have pancreatitis and was admitted for further management. Oncology was consulted and she met with Dr. Benay Spice who will be overseeing management of her cancer. Upon workup, she was found to have lymphadenopathy suspicious for metastatic disease.  CT chest 06/20/2018: 1. Diffuse metastatic pulmonary nodules in both lungs. 2. Subclavicular, mediastinal and hilar lymph nodes suspicious for metastatic disease. 3. Acute pancreatitis. Poor enhancement of the pancreas could suggest pancreatic necrosis.  IR requested by Dr. Bonner Puna for possible image-guided left supraclavicular lymph node biopsy to evaluate for possible metastatic disease. Patient awake and alert sitting in bed. Complains of abdominal pain rated 8.5/10, stable at this time. Denies fever, chills, chest pain, dyspnea, dizziness, or headache.  Patient is currently receiving Lovenox 40 mg SQ Q24H injections- last injection 06/20/2018 at 2219.   Past Medical History:  Diagnosis Date  . ADD (attention deficit disorder)   . Adenocarcinoma of duodenum ([place]) 06/16/2018  . Anxiety   . Family history of adverse reaction to anesthesia    mom goes into afib    . Wears glasses    driving    Past Surgical History:  Procedure Laterality Date  . BILIARY STENT PLACEMENT N/A 06/14/2018   Procedure: BILIARY STENT PLACEMENT;  Surgeon: Milus Banister, MD;  Location: [facility]OSCOPY;  Service: Endoscopy;  Laterality: N/A;  . BIOPSY  06/14/2018   Procedure: BIOPSY;  Surgeon: Milus Banister, MD;  Location: [facility];  Service: Endoscopy;;  . ENDOSCOPIC RETROGRADE CHOLANGIOPANCREATOGRAPHY (ERCP) WITH PROPOFOL N/A 06/14/2018   Procedure: ENDOSCOPIC RETROGRADE CHOLANGIOPANCREATOGRAPHY (ERCP) WITH PROPOFOL;  Surgeon: Milus Banister, MD;  Location: [facility];  Service: Endoscopy;  Laterality: N/A;  . ESOPHAGOGASTRODUODENOSCOPY N/A 06/14/2018   Procedure: ESOPHAGOGASTRODUODENOSCOPY (EGD);  Surgeon: Milus Banister, MD;  Location: [facility];  Service: Endoscopy;  Laterality: N/A;  . EUS N/A 06/14/2018   Procedure: UPPER ENDOSCOPIC ULTRASOUND (EUS) RADIAL;  Surgeon: Milus Banister, MD;  Location: [facility];  Service: Endoscopy;  Laterality: N/A;  . ORIF ANKLE FRACTURE Left 08/29/2013   Procedure: LEFT FIBULA -FRACTURE OPEN TREATMENT DISTAL FIBULAR (LATERALERAL MALLEOLUS) INCLUDES INTERNAL FIXATION, ;  Surgeon: [name], MD;  Location: [place];  Service: Orthopedics;  Laterality: Left;  . PERINEAL LACERATION REPAIR     post childbirth  . SPHINCTEROTOMY  06/14/2018   Procedure: SPHINCTEROTOMY;  Surgeon: Milus Banister, MD;  Location: [facility];  Service: Endoscopy;;  . TIBIA IM NAIL INSERTION Left 08/29/2013   Procedure: FRACTURE TREATMENT TIBIAL SHAFT BY INTRAMEDULLARY IMPLANT WITH SCREWS ;  Surgeon: [name], MD;  Location: [place];  Service: Orthopedics;  Laterality: Left;    Allergies: Oxycodone  Medications: Prior to Admission medications   Medication Sig Start Date End Date Taking? Authorizing Provider  ALPRAZolam Duanne Moron) 0.5 MG tablet Take 1 tablet (0.5  mg total) by mouth 3 (three) times  daily as needed for sleep or anxiety. 06/14/18 06/14/19 Yes Milus Banister, MD  amphetamine-dextroamphetamine (ADDERALL) 30 MG tablet Take 30 mg by mouth 2 (two) times daily.   Yes [provider]  anti-nausea (EMETROL) solution Take 30 mLs by mouth every 15 (fifteen) minutes as needed for nausea or vomiting.   Yes [provider]  diphenhydrAMINE (BENADRYL) 50 MG capsule Take 50 mg by mouth every 6 (six) hours as needed for itching.   Yes [provider]  HYDROcodone-acetaminophen (NORCO/VICODIN) 5-325 MG tablet Take 1-2 tablets by mouth every 6 (six) hours as needed for moderate pain. 06/15/18  Yes Milus Banister, MD  hydrOXYzine (ATARAX/VISTARIL) 25 MG tablet Take 1-2 tablets by mouth every 6 hours as needed for itching. Patient taking differently: Take 25-50 mg by mouth every 6 (six) hours as needed for itching.  06/06/18  Yes Esterwood, Amy S, PA-C  ibuprofen (ADVIL,MOTRIN) 200 MG tablet Take 800 mg by mouth every 6 (six) hours as needed for moderate pain.   Yes [provider]  ketorolac (TORADOL) 10 MG tablet Take 1 tablet by mouth every 6-8 hours as needed. Patient taking differently: Take 10 mg by mouth every 6 (six) hours as needed for moderate pain.  06/06/18  Yes Esterwood, Amy S, PA-C  levonorgestrel (MIRENA) 20 MCG/24HR IUD 1 each by Intrauterine route once.   Yes [provider]  magic mouthwash SOLN Take 5 mLs by mouth 4 (four) times daily as needed for mouth pain.   Yes [provider]  nystatin-triamcinolone ointment (MYCOLOG) Apply 1 application topically 2 (two) times daily as needed (for itching).    Yes [provider]  ondansetron (ZOFRAN) 4 MG tablet Take 1 tablet every 6 hours as needed for nausea. Patient taking differently: Take 4 mg by mouth every 6 (six) hours as needed for nausea or vomiting.  06/06/18  Yes Esterwood, Amy S, PA-C     Family History  Problem Relation Age of Onset  . Heart disease Mother   .  Heart disease Father     Social History   Socioeconomic History  . Marital status: Single    Spouse name: Not on file  . Number of children: Not on file  . Years of education: Not on file  . Highest education level: Not on file  Occupational History  . Not on file  Social Needs  . Financial resource strain: Not on file  . Food insecurity:    Worry: Not on file    Inability: Not on file  . Transportation needs:    Medical: Not on file    Non-medical: Not on file  Tobacco Use  . Smoking status: Current Some Day Smoker  . Smokeless tobacco: Never Used  Substance and Sexual Activity  . Alcohol use: Yes    Comment: occ  . Drug use: No  . Sexual activity: Not Currently    Comment: a pk/week  Lifestyle  . Physical activity:    Days per week: Not on file    Minutes per session: Not on file  . Stress: Not on file  Relationships  . Social connections:    Talks on phone: Not on file    Gets together: Not on file    Attends religious service: Not on file    Active member of club or organization: Not on file    Attends meetings of clubs or organizations: Not on file  Relationship status: Not on file  Other Topics Concern  . Not on file  Social History Narrative  . Not on file     Review of Systems: A 12 point ROS discussed and pertinent positives are indicated in the HPI above.  All other systems are negative.  Review of Systems  Constitutional: Negative for chills and fever.  Respiratory: Negative for shortness of breath and wheezing.   Cardiovascular: Negative for chest pain and palpitations.  Gastrointestinal: Positive for abdominal pain.  Neurological: Negative for dizziness and headaches.  Psychiatric/Behavioral: Negative for behavioral problems and confusion.    Vital Signs: BP 134/76 (BP Location: Right Arm)   Pulse 83   Temp 98 F (36.7 C) (Oral)   Resp 14   Ht _0  (1.803 m)   Wt 232 lb 3.2 oz (105.3 kg)   SpO2 99%   BMI 32.39 kg/m   Physical  Exam Vitals signs and nursing note reviewed.  Constitutional:      General: She is not in acute distress.    Appearance: Normal appearance.  Cardiovascular:     Rate and Rhythm: Normal rate and regular rhythm.     Heart sounds: Normal heart sounds. No murmur.  Pulmonary:     Effort: Pulmonary effort is normal. No respiratory distress.     Breath sounds: Normal breath sounds. No wheezing.  Skin:    General: Skin is warm and dry.  Neurological:     Mental Status: She is alert and oriented to person, place, and time.  Psychiatric:        Mood and Affect: Mood normal.        Behavior: Behavior normal.        Thought Content: Thought content normal.        Judgment: Judgment normal.      MD Evaluation Airway: WNL Heart: WNL Abdomen: WNL Chest/ Lungs: WNL ASA  Classification: 3 Mallampati/Airway Score: Two   Imaging: Dg Chest 2 View  Result Date: 06/16/2018 CLINICAL DATA:  Pancreatic mass and colon cancer EXAM: CHEST - 2 VIEW COMPARISON:  None. FINDINGS: AP and lateral views of the chest were obtained. The lungs are clear without focal pneumonia, edema, pneumothorax or pleural effusion. The cardiopericardial silhouette is within normal limits for size. Scattered bilateral pulmonary nodules are evident. The visualized bony structures of the thorax are intact. Telemetry leads overlie the chest. IMPRESSION: 1. Bilateral pulmonary nodules highly suspicious for metastatic disease. 2. No edema or pleural effusion. Electronically Signed   By: Misty Stanley M.D.   On: 06/16/2018 20:45   Ct Chest W Contrast  Result Date: 06/20/2018 CLINICAL DATA:  Malignant biliary obstruction status post ERCP and common bile duct stent placement 06/14/2018. Large ulcerated duodenal mass was identified. Evaluate for possible lung nodules. EXAM: CT CHEST WITH CONTRAST TECHNIQUE: Multidetector CT imaging of the chest was performed during intravenous contrast administration. CONTRAST:  1m OMNIPAQUE IOHEXOL 300  MG/ML  SOLN COMPARISON:  CT abdomen 06/16/2018 FINDINGS: Cardiovascular: The heart is normal in size. No pericardial effusion. The aorta is normal in caliber. No dissection. The branch vessels are patent. No definite coronary artery calcifications. Mediastinum/Nodes: 9.5 mm right subclavicular lymph node adjacent to the left thyroid lobe is suspicious for malignant adenopathy. Slightly more inferiorly is an 8 mm node between the left carotid artery and the left subclavian artery. Mediastinal lymphadenopathy, worrisome for metastatic disease. 10 mm right hilar node on image number 61. 10 mm subcarinal lymph node on image number 62. 7 mm  right paratracheal node on image number 49. The esophagus is grossly normal. Lungs/Pleura: Numerous bilateral pulmonary nodules consistent with metastatic disease. Some of these demonstrate mild cavitation. Index node in the right upper lobe on image number 64 measures 9.5 mm. Index node in the left lower lobe on image number 120 measures 8 mm. 7 mm right lower lobe on image number 75. No acute pulmonary findings.  No pleural effusion. Upper Abdomen: There is a common bile duct stent in place. There is New significant inflammatory change involving the pancreas and peripancreatic tissues suggesting acute pancreatitis. Is also inflammation and fluid extending into the periportal region and into the anterior pararenal fossa bilaterally. High attenuation lesions in the gallbladder, likely gallstones. Pneumobilia associated with the common bile duct stent. Musculoskeletal: No breast masses, chest wall mass or axillary adenopathy. IMPRESSION: 1. Diffuse metastatic pulmonary nodules in both lungs. 2. Subclavicular, mediastinal and hilar lymph nodes suspicious for metastatic disease. 3. Acute pancreatitis. Poor enhancement of the pancreas could suggest pancreatic necrosis. These results will be called to the ordering clinician or representative by the Radiologist Assistant, and communication  documented in the PACS or zVision Dashboard. Electronically Signed   By: Marijo Sanes M.D.   On: 06/20/2018 17:59   Ct Abdomen Pelvis W Contrast  Result Date: 06/16/2018 CLINICAL DATA:  Per patient ECG Thursday recent diagnosis pancreatic mass and colon caMid-Severe lower abdomen pain nauseaNo prior surgeriesPain since procedure, no fever. EXAM: CT ABDOMEN AND PELVIS WITH CONTRAST TECHNIQUE: Multidetector CT imaging of the abdomen and pelvis was performed using the standard protocol following bolus administration of intravenous contrast. CONTRAST:  141m ISOVUE-300 IOPAMIDOL (ISOVUE-300) INJECTION 61% COMPARISON:  None. FINDINGS: Lower chest: Multiple lung base nodules consistent with metastatic disease. Two largest on the left, 6 mm nodule, subpleural lateral left lower lobe, image 15, series 4 and 7 mm nodule, posteromedial left lower lobe, image 27. Largest on the right, 6 mm posterior inferior lower lobe nodule, image 23. No acute findings at the lung bases. Hepatobiliary: Liver normal in size and attenuation. No masses or focal lesions. There is intrahepatic biliary air. Gallbladder is mildly distended. It contains dense fluid had at least 1 discrete stone. No wall thickening or adjacent inflammation. There is air within the common bile duct. A distal common bile duct stent extends across the pancreatic head to the second portion of the duodenum. Pancreas: There is fullness of the pancreatic head without a defined mass. Pancreatic duct is dilated to a maximum of 5 mm. There is hazy inflammatory change in the peripancreatic fat tracking along the gastrohepatic ligament. Multiple peripancreatic subcentimeter lymph nodes are noted. Spleen: Normal in size without focal abnormality. Adrenals/Urinary Tract: No adrenal masses. No renal masses. 3-4 mm nonobstructing stone in the upper pole the right kidney. No other intrarenal stones. No hydronephrosis. Ureters are normal in course and in caliber. Bladder is  unremarkable. Stomach/Bowel: Mild wall thickening of the duodenum adjacent to the pancreatic head, presumed reactive. Stomach is unremarkable. Small bowel is normal in caliber with no wall thickening or inflammation. Colon is normal in caliber with no wall thickening or inflammation. No CT evidence of a colonic mass. Normal appendix visualized. Vascular/Lymphatic: There are prominent retroperitoneal lymph nodes all subcentimeter short axis. Portal vein, superior mesenteric vein and splenic vein are patent. No aortic atherosclerosis. Reproductive: Well-positioned IUD. Uterus and adnexa otherwise unremarkable. Other: No abdominal wall hernia.  No ascites. Musculoskeletal: No fracture or acute finding. No osteoblastic or osteolytic lesions. Disc degenerative changes at L2-L3.  IMPRESSION: 1. There are peripancreatic inflammatory changes consistent with pancreatitis. No discrete fluid collection is seen to suggest a pseudocyst. There is no venous thrombosis and no evidence of pancreatic necrosis. No other evidence of an acute abnormality. 2. Fullness of the pancreatic head. No defined mass. Pancreatic duct is dilated to 5 mm. 3. Multiple subcentimeter peripancreatic lymph nodes. Given the history of pancreatic carcinoma this is suspicious for metastatic adenopathy. 4. Well-positioned distal common bile duct stent. There is intrahepatic biliary and common bile duct air. 5. No visualized colonic mass. 6. Multiple lung base nodules consistent with metastatic disease. 7. Nonobstructing stone in the upper pole of the right kidney. Electronically Signed   By: Lajean Manes M.D.   On: 06/16/2018 17:13   Dg Ercp Biliary & Pancreatic Ducts  Result Date: 06/14/2018 CLINICAL DATA:  CBD stent placement EXAM: ERCP TECHNIQUE: Multiple spot images obtained with the fluoroscopic device and submitted for interpretation post-procedure. FLUOROSCOPY TIME:  Fluoroscopy Time:  3 minutes and 28 seconds Radiation Exposure Index (if provided  by the fluoroscopic device): Number of Acquired Spot Images: 0 COMPARISON:  None. FINDINGS: Imaging demonstrates cannulation of the common bile duct and contrast filling the biliary tree. There is narrowing of the common bile duct through the pancreas. Subsequent images demonstrate biliary duct balloon dilatation. The final image demonstrates a metallic stent across the area of narrowing from the dilated common bile duct into the duodenum. IMPRESSION: Successful common bile duct stent placement as described. These images were submitted for radiologic interpretation only. Please see the procedural report for the amount of contrast and the fluoroscopy time utilized. Electronically Signed   By: Marybelle Killings M.D.   On: 06/14/2018 13:50   [place] Ekg Site Rite  Result Date: 06/21/2018 If Site Rite image not attached, placement could not be confirmed due to current cardiac rhythm.  Ct Outside Films Spine  Result Date: 06/18/2018 This examination belongs to an outside facility and is stored here for comparison purposes only.  Contact the originating outside institution for any associated report or interpretation.  Ct Outside Films Body  Result Date: 06/18/2018 This examination belongs to an outside facility and is stored here for comparison purposes only.  Contact the originating outside institution for any associated report or interpretation.   Labs:  CBC: Recent Labs    06/16/18 1549 06/17/18 0515 06/20/18 0558 06/21/18 0523  WBC 10.6* 6.2 4.6 4.5  HGB 10.8* 9.4* 9.0* 9.3*  HCT 33.8* 29.9* 27.8* 28.8*  PLT 382 348 344 395    COAGS: Recent Labs    06/06/18 1458 06/16/18 1901  INR 1.0 1.05    BMP: Recent Labs    06/17/18 0515 06/18/18 0521 06/20/18 0558 06/21/18 0523  NA 136 134* 137 137  K 3.5 3.5 3.1* 2.7*  CL 106 104 103 103  CO2 _0 GLUCOSE 100* 86 118* 124*  BUN 12 9 5* <5*  CALCIUM 8.2* 8.4* 8.5* 8.4*  CREATININE 0.51 0.53 0.51 0.47  GFRNONAA >60 >60 >60 >60    GFRAA >60 >60 >60 >60    LIVER FUNCTION TESTS: Recent Labs    06/18/18 0521 06/19/18 0554 06/20/18 0558 06/21/18 0523  BILITOT 1.9* 1.9* 1.6* 1.8*  AST 48* 42* 43* 49*  ALT 176* 139* 112* 101*  ALKPHOS 186* 170* 155* 149*  PROT 6.3* 6.5 6.2* 6.5  ALBUMIN 3.2* 3.2* 3.0* 3.2*    TUMOR MARKERS: Recent Labs    06/06/18 1458  CEA 7.9*  CA199 30  Assessment and Plan:  Left supraclavicular lymphadenopathy. Plan for image-guided left supraclavicular lymphadenopathy tentatively for tomorrow pending schedule with Dr. Laurence Ferrari- if we cannot accommodate tomorrow due to scheduling, procedure can be scheduled on an outpatient basis. Patient will be NPO at midnight the night prior to procedure. Afebrile and WBCs WNL. Patient is currently receiving Lovenox 40 mg SQ Q24H injections, last injection 06/20/2018 at 2219- will hold per IR protocol. INR 1.05 seconds 06/16/2018.  Risks and benefits discussed with the patient including, but not limited to bleeding, infection, damage to adjacent structures or low yield requiring additional tests. All of the patient's questions were answered, patient is agreeable to proceed. Consent signed and in chart.   Thank you for this interesting consult.  I greatly enjoyed meeting Hailey Houston and look forward to participating in their care.  A copy of this report was sent to the requesting provider on this date.  Electronically Signed: Earley Abide, PA-C 06/21/2018, 2:45 PM   I spent a total of 40 Minutes in face to face in clinical consultation, greater than 50% of which was counseling/coordinating care for left supraclavicular lymphadenopathy.

## 2018-06-21 NOTE — Progress Notes (Signed)
CRITICAL VALUE ALERT  Critical Value:  Potassium 2.7  Date & Time Notified:  06/21/2018 4975  Provider Notified: Lamar Blinks, NP  Orders Received/Actions taken: awaiting orders, RN Mechele Claude notified.

## 2018-06-21 NOTE — Progress Notes (Signed)
PROGRESS NOTE  Hailey Houston  TDS:[id number] DOB: 05/05/1971 DOA: 06/16/2018 PCP: Patient, No Pcp Per   Brief Narrative: 48 y.o.femalewith medical history significant of ADHD who presented 2/1 with post-ERCP pancreatitis. ERCP was on 1/30 in investigation of 1 month of nausea, decreased appetite, abdominal pain and weight loss with elevated LFTs and pancreatic head mass with intr- and extra-hepatic ductal dilatation. There was some concern for metastatic spread with pulmonary nodules and porta hepatis gastrohepatic area and retroperitoneal lymphadenopathy. ERCP found severely abnormal duodenum causing a distal CBD stricture she had a limited biliary sphincterectomy and balloon dilatation. Biopsies were obtained, and biliary stent placed. Since admission diet has been restricted and IVF's given with GI consulted.   Assessment & Plan: Active Problems:   Adenocarcinoma of duodenum (HCC)   Pancreatitis   Hypokalemia   Dehydration   Nausea and vomiting   Acute pancreatitis   Pulmonary nodules  Adenocarcinoma of duodenum: with CT findings suggestive of metastases throughout lungs and lymph nodes.  - Oncology, Dr. Benay Spice consulted. Has provided information regarding chemotherapy and mentioned additional biopsies of lymph nodes. Discussed with him today, would like to prove this is metastatic disease with biopsy. I've consulted IR for biopsy, likely target would be supraclavicular node.  Post-ERCP pancreatitis: With limited contrast enhancement on CT 2/5, consistent with forming necrosis.  - Appreciate GI recommendations.  - Will go back to NPO for bowel rest. Discussed with GI PA and pt, will insert PICC and start TPN due to anticipated protracted recovery and need for optimization with chemotherapy forthcoming. - Continue supportive care with IVF, analgesics, etc.   Hypokalemia:  - Supplement in IVF's and through IV today with empiric magnesium. Recheck regularly.  Dehydration:  -  Continue IVF  Rash: Unclear etiology, though may be worsened by anxiety.  - Advised increased potency (to medium) topical steroid TID and occlusive therapy.  - Increase prn antihistamine as this pruritus is contributing to irritability.  DVT prophylaxis: Lovenox subQ Code Status: Full Family Communication: None at bedside Disposition Plan: Uncertain at this time  Consultants:   GI  Oncology  Procedures:   None  Antimicrobials:  None   Subjective: Abdominal pain is severe, constant, waxing/waning in the mid abdomen and upper abdomen radiating at times to both sides. Frustrated with hospitalization, was only able to take a couple ounces of clear liquids all of yesterday.   Objective: Vitals:   06/20/18 2152 06/21/18 0437 06/21/18 0530 06/21/18 1301  BP: 136/73 (!) 183/93 140/82 134/76  Pulse: 78 (!) 121 78 83  Resp: 18 18 16 14   Temp: 98.6 F (37 C) 97.7 F (36.5 C)  98 F (36.7 C)  TempSrc: Oral Oral  Oral  SpO2: 98% 99% 96% 99%  Weight:      Height:        Intake/Output Summary (Last 24 hours) at 06/21/2018 1313 Last data filed at 06/21/2018 0900 Gross per 24 hour  Intake 1922.93 ml  Output -  Net 1922.93 ml   Filed Weights   06/17/18 0049  Weight: 105.3 kg   Gen: 48 y.o. female in no distress Pulm: Nonlabored breathing room air. Clear. CV: Regular rate and rhythm. No murmur, rub, or gallop. No JVD, no dependent edema. GI: Abdomen soft, tender diffusely worst in epigastrium, non-distended, with normoactive bowel sounds.  Ext: Warm, no deformities Skin: Scattered areas of pruritic erythema.  Neuro: Alert and oriented. No focal neurological deficits. Psych: Judgement and insight appear fair. Mood euthymic & affect congruent. Behavior  is appropriate.    Data Reviewed: I have personally reviewed following labs and imaging studies  CBC: Recent Labs  Lab 06/16/18 1549 06/17/18 0515 06/20/18 0558 06/21/18 0523  WBC 10.6* 6.2 4.6 4.5  NEUTROABS 7.7  --    --   --   HGB 10.8* 9.4* 9.0* 9.3*  HCT 33.8* 29.9* 27.8* 28.8*  MCV 98.5 98.4 98.6 97.6  PLT 382 348 344 235   Basic Metabolic Panel: Recent Labs  Lab 06/16/18 1549 06/17/18 0515 06/18/18 0521 06/20/18 0558 06/21/18 0523  NA 136 136 134* 137 137  K 3.0* 3.5 3.5 3.1* 2.7*  CL 101 106 104 103 103  CO2 25 22 23 26 26   GLUCOSE 107* 100* 86 118* 124*  BUN 13 12 9  5* <5*  CREATININE 0.69 0.51 0.53 0.51 0.47  CALCIUM 8.9 8.2* 8.4* 8.5* 8.4*  MG 1.9 1.8  --   --   --   PHOS 3.7 3.9  --   --  3.9   GFR: Estimated Creatinine Clearance: 118.6 mL/min (by C-G formula based on SCr of 0.47 mg/dL). Liver Function Tests: Recent Labs  Lab 06/17/18 0515 06/18/18 0521 06/19/18 0554 06/20/18 0558 06/21/18 0523  AST 69* 48* 42* 43* 49*  ALT 228* 176* 139* 112* 101*  ALKPHOS 207* 186* 170* 155* 149*  BILITOT 2.7* 1.9* 1.9* 1.6* 1.8*  PROT 6.4* 6.3* 6.5 6.2* 6.5  ALBUMIN 3.4* 3.2* 3.2* 3.0* 3.2*   Recent Labs  Lab 06/16/18 1549 06/17/18 0515 06/18/18 0521 06/19/18 0554 06/20/18 0558  LIPASE 348* 204* 102* 88* 94*   No results for input(s): AMMONIA in the last 168 hours. Coagulation Profile: Recent Labs  Lab 06/16/18 1901  INR 1.05   Cardiac Enzymes: No results for input(s): CKTOTAL, CKMB, CKMBINDEX, TROPONINI in the last 168 hours. BNP (last 3 results) No results for input(s): PROBNP in the last 8760 hours. HbA1C: No results for input(s): HGBA1C in the last 72 hours. CBG: No results for input(s): GLUCAP in the last 168 hours. Lipid Profile: No results for input(s): CHOL, HDL, LDLCALC, TRIG, CHOLHDL, LDLDIRECT in the last 72 hours. Thyroid Function Tests: No results for input(s): TSH, T4TOTAL, FREET4, T3FREE, THYROIDAB in the last 72 hours. Anemia Panel: No results for input(s): VITAMINB12, FOLATE, FERRITIN, TIBC, IRON, RETICCTPCT in the last 72 hours. Urine analysis: No results found for: COLORURINE, APPEARANCEUR, LABSPEC, PHURINE, GLUCOSEU, HGBUR, BILIRUBINUR,  KETONESUR, PROTEINUR, UROBILINOGEN, NITRITE, LEUKOCYTESUR No results found for this or any previous visit (from the past 240 hour(s)).    Radiology Studies: Ct Chest W Contrast  Result Date: 06/20/2018 CLINICAL DATA:  Malignant biliary obstruction status post ERCP and common bile duct stent placement 06/14/2018. Large ulcerated duodenal mass was identified. Evaluate for possible lung nodules. EXAM: CT CHEST WITH CONTRAST TECHNIQUE: Multidetector CT imaging of the chest was performed during intravenous contrast administration. CONTRAST:  16mL OMNIPAQUE IOHEXOL 300 MG/ML  SOLN COMPARISON:  CT abdomen 06/16/2018 FINDINGS: Cardiovascular: The heart is normal in size. No pericardial effusion. The aorta is normal in caliber. No dissection. The branch vessels are patent. No definite coronary artery calcifications. Mediastinum/Nodes: 9.5 mm right subclavicular lymph node adjacent to the left thyroid lobe is suspicious for malignant adenopathy. Slightly more inferiorly is an 8 mm node between the left carotid artery and the left subclavian artery. Mediastinal lymphadenopathy, worrisome for metastatic disease. 10 mm right hilar node on image number 61. 10 mm subcarinal lymph node on image number 62. 7 mm right paratracheal node on image number  44. The esophagus is grossly normal. Lungs/Pleura: Numerous bilateral pulmonary nodules consistent with metastatic disease. Some of these demonstrate mild cavitation. Index node in the right upper lobe on image number 64 measures 9.5 mm. Index node in the left lower lobe on image number 120 measures 8 mm. 7 mm right lower lobe on image number 75. No acute pulmonary findings.  No pleural effusion. Upper Abdomen: There is a common bile duct stent in place. There is New significant inflammatory change involving the pancreas and peripancreatic tissues suggesting acute pancreatitis. Is also inflammation and fluid extending into the periportal region and into the anterior pararenal fossa  bilaterally. High attenuation lesions in the gallbladder, likely gallstones. Pneumobilia associated with the common bile duct stent. Musculoskeletal: No breast masses, chest wall mass or axillary adenopathy. IMPRESSION: 1. Diffuse metastatic pulmonary nodules in both lungs. 2. Subclavicular, mediastinal and hilar lymph nodes suspicious for metastatic disease. 3. Acute pancreatitis. Poor enhancement of the pancreas could suggest pancreatic necrosis. These results will be called to the ordering clinician or representative by the Radiologist Assistant, and communication documented in the PACS or zVision Dashboard. Electronically Signed   By: Marijo Sanes M.D.   On: 06/20/2018 17:59    Scheduled Meds: . bisacodyl  10 mg Rectal Once  . clotrimazole  1 Applicatorful Vaginal QHS  . diphenhydrAMINE  50 mg Oral Once  . enoxaparin (LOVENOX) injection  40 mg Subcutaneous Q24H  . pantoprazole (PROTONIX) IV  40 mg Intravenous Q24H  . triamcinolone cream   Topical TID   Continuous Infusions: . dextrose 5 % and 0.45 % NaCl with KCl 40 mEq/L 100 mL/hr at 06/21/18 0838     LOS: 5 days   Time spent: 35 minutes.  Patrecia Pour, MD [facility] [email or web address] Password [email or web address] 06/21/2018, 1:13 PM

## 2018-06-22 ENCOUNTER — Inpatient Hospital Stay (HOSPITAL_COMMUNITY): Payer: 59

## 2018-06-22 DIAGNOSIS — R590 Localized enlarged lymph nodes: Secondary | ICD-10-CM

## 2018-06-22 DIAGNOSIS — E440 Moderate protein-calorie malnutrition: Secondary | ICD-10-CM

## 2018-06-22 LAB — GLUCOSE, CAPILLARY: Glucose-Capillary: 101 mg/dL — ABNORMAL HIGH (ref 70–99)

## 2018-06-22 LAB — COMPREHENSIVE METABOLIC PANEL
ALT: 95 U/L — ABNORMAL HIGH (ref 0–44)
AST: 57 U/L — ABNORMAL HIGH (ref 15–41)
Albumin: 3.2 g/dL — ABNORMAL LOW (ref 3.5–5.0)
Alkaline Phosphatase: 142 U/L — ABNORMAL HIGH (ref 38–126)
Anion gap: 7 (ref 5–15)
BUN: <5 mg/dL — ABNORMAL LOW (ref 6–20)
CALCIUM: 8.3 mg/dL — AB (ref 8.9–10.3)
CO2: 25 mmol/L (ref 22–32)
Chloride: 104 mmol/L (ref 98–111)
Creatinine, Ser: 0.5 mg/dL (ref 0.44–1.00)
GFR calc Af Amer: >60 mL/min (ref >60)
Glucose, Bld: 124 mg/dL — ABNORMAL HIGH (ref 70–99)
Potassium: 3.4 mmol/L — ABNORMAL LOW (ref 3.5–5.1)
Sodium: 136 mmol/L (ref 135–145)
Total Bilirubin: 1.8 mg/dL — ABNORMAL HIGH (ref 0.3–1.2)
Total Protein: 6.2 g/dL — ABNORMAL LOW (ref 6.5–8.1)

## 2018-06-22 LAB — DIFFERENTIAL
Abs Immature Granulocytes: 0.02 10*3/uL (ref 0.00–0.07)
Basophils Absolute: 0 10*3/uL (ref 0.0–0.1)
Basophils Relative: 1 %
Eosinophils Absolute: 0.3 10*3/uL (ref 0.0–0.5)
Eosinophils Relative: 5 %
Immature Granulocytes: 0 %
Lymphocytes Relative: 22 %
Lymphs Abs: 1.1 10*3/uL (ref 0.7–4.0)
MONOS PCT: 15 %
Monocytes Absolute: 0.7 10*3/uL (ref 0.1–1.0)
Neutro Abs: 2.8 10*3/uL (ref 1.7–7.7)
Neutrophils Relative %: 57 %

## 2018-06-22 LAB — CBC
HCT: 29 % — ABNORMAL LOW (ref 36.0–46.0)
Hemoglobin: 9.3 g/dL — ABNORMAL LOW (ref 12.0–15.0)
MCH: 31.6 pg (ref 26.0–34.0)
MCHC: 32.1 g/dL (ref 30.0–36.0)
MCV: 98.6 fL (ref 80.0–100.0)
Platelets: 438 10*3/uL — ABNORMAL HIGH (ref 150–400)
RBC: 2.94 MIL/uL — ABNORMAL LOW (ref 3.87–5.11)
RDW: 13.4 % (ref 11.5–15.5)
WBC: 4.9 10*3/uL (ref 4.0–10.5)
nRBC: 0 % (ref 0.0–0.2)

## 2018-06-22 LAB — PHOSPHORUS: Phosphorus: 4.2 mg/dL (ref 2.5–4.6)

## 2018-06-22 LAB — MAGNESIUM: Magnesium: 2.2 mg/dL (ref 1.7–2.4)

## 2018-06-22 LAB — TRIGLYCERIDES: TRIGLYCERIDES: 81 mg/dL (ref <150)

## 2018-06-22 LAB — PREALBUMIN: PREALBUMIN: 7.2 mg/dL — AB (ref 18–38)

## 2018-06-22 MED ORDER — TRAVASOL 10 % IV SOLN
INTRAVENOUS | Status: AC
  Administered 2018-06-22: 18:00:00 via INTRAVENOUS
  Filled 2018-06-22: qty 381.6

## 2018-06-22 MED ORDER — DIPHENHYDRAMINE HCL 25 MG PO CAPS
25.0000 mg | ORAL_CAPSULE | Freq: Four times a day (QID) | ORAL | Status: DC | PRN
  Administered 2018-06-22: 25 mg via ORAL
  Filled 2018-06-22: qty 1

## 2018-06-22 MED ORDER — INSULIN ASPART 100 UNIT/ML ~~LOC~~ SOLN
0.0000 [IU] | Freq: Four times a day (QID) | SUBCUTANEOUS | Status: AC
  Administered 2018-06-23 – 2018-06-29 (×13): 1 [IU] via SUBCUTANEOUS

## 2018-06-22 MED ORDER — BISACODYL 10 MG RE SUPP
10.0000 mg | Freq: Every day | RECTAL | Status: DC | PRN
  Administered 2018-06-23 – 2018-06-28 (×4): 10 mg via RECTAL
  Filled 2018-06-22 (×4): qty 1

## 2018-06-22 MED ORDER — DEXTROSE-NACL 5-0.45 % IV SOLN
INTRAVENOUS | Status: AC
  Administered 2018-06-22 – 2018-06-23 (×2): via INTRAVENOUS

## 2018-06-22 MED ORDER — DIPHENHYDRAMINE HCL 50 MG PO CAPS
50.0000 mg | ORAL_CAPSULE | Freq: Four times a day (QID) | ORAL | Status: DC | PRN
  Administered 2018-06-22 – 2018-06-27 (×17): 50 mg via ORAL
  Filled 2018-06-22 (×18): qty 1

## 2018-06-22 MED ORDER — KETOROLAC TROMETHAMINE 30 MG/ML IJ SOLN
30.0000 mg | Freq: Once | INTRAMUSCULAR | Status: AC
  Administered 2018-06-22: 30 mg via INTRAVENOUS
  Filled 2018-06-22: qty 1

## 2018-06-22 MED ORDER — LIDOCAINE HCL 1 % IJ SOLN
INTRAMUSCULAR | Status: AC
  Filled 2018-06-22: qty 20

## 2018-06-22 NOTE — Progress Notes (Signed)
Initial Nutrition Assessment  DOCUMENTATION CODES:   Non-severe (moderate) malnutrition in context of acute illness/injury, Obesity unspecified  INTERVENTION:  - TPN initiation and advancement per Pharmacy. - Diet advancement as medically feasible.  Monitor magnesium, potassium, and phosphorus daily for at least 3 days, MD to replete as needed, as pt is at risk for refeeding syndrome given malnutrition, very poor PO intakes x3 weeks PTA.    NUTRITION DIAGNOSIS:   Moderate Malnutrition related to acute illness, cancer and cancer related treatments as evidenced by percent weight loss, energy intake < or equal to 50% for > or equal to 5 days.  GOAL:   Patient will meet greater than or equal to 90% of their needs  MONITOR:   Diet advancement, Weight trends, Labs, Other (Comment)(TPN regimen)  REASON FOR ASSESSMENT:   Consult New TPN/TNA  ASSESSMENT:   48 y.o. female with medical history significant of ADHD who presented 2/1 with post-ERCP pancreatitis. ERCP was on 1/30 in investigation of 1 month of nausea, decreased appetite, abdominal pain and weight loss with elevated LFTs and pancreatic head mass with hepatic ductal dilatation. There was some concern for metastatic spread with pulmonary nodules and porta hepatis gastrohepatic area and retroperitoneal lymphadenopathy. ERCP found severely abnormal duodenum causing a distal CBD stricture. Biopsies were obtained, and biliary stent placed.  Patient admitted on 2/1 and has been NPO since that time other than being on CLD from 2/5 at 10:30 AM-2/6 at 1:15 PM. During that time she was only able to drink a few sips. Patient reports that she has had a very poor appetite over the past 3-4 weeks with ongoing nausea, cramping, shooting pain, and feelings of reflux. Her last intake of solid food was on 1/31 when she attempted to eat a few bites of pizza at [employer]. Prior to that, she was able to eat two small meals of tuna and  asparagus, which she reports was the most she has eaten in the past 3 weeks.   Patient reports ongoing abdominal pain/pressure/cramping and nausea. She has not experienced any vomiting today. Double lumen PICC placed in R brachial on 2/6 with plan to start TPN today. Talked with Pharmacist and plan is to start TPN at less than half rate and to be mindful of patient being at high risk for refeeding.   Per chart review, current weight is 227 lb and weight on 1/22 was 236 lb. This indicates 9 lb weight loss (4% body weight) in the past 16 days; significant for time frame.   Per Dr. Loletha Carrow' note yesterday: patient unlikely to tolerate post-pyloric tube as tip of tube would be lying near stent and ulcerated mass and plan to discuss with oncology about a port and ability to use port for TPN and chemo. RD in agreement with these items.    Medications reviewed. Labs reviewed; K: 3.4 mmol/l, BUN: <5 mg/dl, Ca: 8.3 mg/dl, Alk Phos elevated but trending down, LFTs elevated.  IVF; D5-1/2 NS-40 mEq IV KCl @ 100 ml/hr (408 kcal).     NUTRITION - FOCUSED PHYSICAL EXAM:  Completed; no muscle and no fat wasting.  Diet Order:   Diet Order            Diet NPO time specified  Diet effective now              EDUCATION NEEDS:   No education needs have been identified at this time  Skin:  Skin Assessment: Reviewed RN Assessment  Last BM:  1/29  Height:   Ht Readings from Last 1 Encounters:  06/16/18 5' 11"  (1.803 m)    Weight:   Wt Readings from Last 1 Encounters:  06/22/18 102.9 kg    Ideal Body Weight:  70.45 kg  BMI:  Body mass index is 31.65 kg/m.  Estimated Nutritional Needs:   Kcal:  2200-2400 kcal  Protein:  110-120 grams  Fluid:  >/= 2.2 L/day     Jarome Matin, MS, RD, LDN, [facility] Inpatient Clinical Dietitian Pager # [phone] After hours/weekend pager # [phone]

## 2018-06-22 NOTE — Progress Notes (Addendum)
Patient ID: Hailey Houston, female   DOB: Jan 13, 1971, 48 y.o.   MRN: [id number]      Progress Note   Subjective   In better spirits today , waiting for lymph node BX this afternoon PICC line placed , TPN to start today   Still with somewhat painful pruritic , papular rash - higher dose benadryl helpful. She has been able to take Dilaudid and benadryl by mouth  Pain fairly well controlled No BM    Objective   Vital signs in last 24 hours: Temp:  [98 F (36.7 C)-99.3 F (37.4 C)] 98.8 F (37.1 C) (02/07 0435) Pulse Rate:  [75-87] 75 (02/07 0435) Resp:  [14-20] 20 (02/07 0435) BP: (134-149)/(76-95) 140/82 (02/07 0435) SpO2:  [97 %-100 %] 100 % (02/07 0435) Weight:  [102.9 kg-103.1 kg] 102.9 kg (02/07 0435) Last BM Date: 06/13/18 General:    white female in NAD Heart:  Regular rate and rhythm; no murmurs Lungs: Respirations even and unlabored, lungs CTA bilaterally Abdomen:  Soft, tender upper abdomen and nondistended. Normal bowel sounds.  Rash persists, less angry looking Extremities:  Without edema. Neurologic:  Alert and oriented,  grossly normal neurologically. Psych:  Cooperative. Normal mood and affect.  Intake/Output from previous day: 02/06 0701 - 02/07 0700 In: 1813.7 [P.O.:120; I.V.:1693.7] Out: -  Intake/Output this shift: No intake/output data recorded.  Lab Results: Recent Labs    06/20/18 0558 06/21/18 0523 06/22/18 0423  WBC 4.6 4.5 4.9  HGB 9.0* 9.3* 9.3*  HCT 27.8* 28.8* 29.0*  PLT 344 395 438*   BMET Recent Labs    06/20/18 0558 06/21/18 0523 06/22/18 0423  NA 137 137 136  K 3.1* 2.7* 3.4*  CL 103 103 104  CO2 26 26 25   GLUCOSE 118* 124* 124*  BUN 5* <5* <5*  CREATININE 0.51 0.47 0.50  CALCIUM 8.5* 8.4* 8.3*   LFT Recent Labs    06/22/18 0423  PROT 6.2*  ALBUMIN 3.2*  AST 57*  ALT 95*  ALKPHOS 142*  BILITOT 1.8*   PT/INR No results for input(s): LABPROT, INR in the last 72 hours.  Studies/Results: Ct Chest W  Contrast  Result Date: 06/20/2018 CLINICAL DATA:  Malignant biliary obstruction status post ERCP and common bile duct stent placement 06/14/2018. Large ulcerated duodenal mass was identified. Evaluate for possible lung nodules. EXAM: CT CHEST WITH CONTRAST TECHNIQUE: Multidetector CT imaging of the chest was performed during intravenous contrast administration. CONTRAST:  29mL OMNIPAQUE IOHEXOL 300 MG/ML  SOLN COMPARISON:  CT abdomen 06/16/2018 FINDINGS: Cardiovascular: The heart is normal in size. No pericardial effusion. The aorta is normal in caliber. No dissection. The branch vessels are patent. No definite coronary artery calcifications. Mediastinum/Nodes: 9.5 mm right subclavicular lymph node adjacent to the left thyroid lobe is suspicious for malignant adenopathy. Slightly more inferiorly is an 8 mm node between the left carotid artery and the left subclavian artery. Mediastinal lymphadenopathy, worrisome for metastatic disease. 10 mm right hilar node on image number 61. 10 mm subcarinal lymph node on image number 62. 7 mm right paratracheal node on image number 49. The esophagus is grossly normal. Lungs/Pleura: Numerous bilateral pulmonary nodules consistent with metastatic disease. Some of these demonstrate mild cavitation. Index node in the right upper lobe on image number 64 measures 9.5 mm. Index node in the left lower lobe on image number 120 measures 8 mm. 7 mm right lower lobe on image number 75. No acute pulmonary findings.  No pleural effusion. Upper Abdomen: There is  a common bile duct stent in place. There is New significant inflammatory change involving the pancreas and peripancreatic tissues suggesting acute pancreatitis. Is also inflammation and fluid extending into the periportal region and into the anterior pararenal fossa bilaterally. High attenuation lesions in the gallbladder, likely gallstones. Pneumobilia associated with the common bile duct stent. Musculoskeletal: No breast masses,  chest wall mass or axillary adenopathy. IMPRESSION: 1. Diffuse metastatic pulmonary nodules in both lungs. 2. Subclavicular, mediastinal and hilar lymph nodes suspicious for metastatic disease. 3. Acute pancreatitis. Poor enhancement of the pancreas could suggest pancreatic necrosis. These results will be called to the ordering clinician or representative by the Radiologist Assistant, and communication documented in the PACS or zVision Dashboard. Electronically Signed   By: Marijo Sanes M.D.   On: 06/20/2018 17:59   [place] Ekg Site Rite  Result Date: 06/21/2018 If Site Rite image not attached, placement could not be confirmed due to current cardiac rhythm.      Assessment / Plan:    #74 48 year old white female with metastatic duodenal adenocarcinoma with large ulcerated duodenal mass causing outlet obstruction but with associated biliary obstruction. Status post ERCP and stent placement 3/41/9622 and course complicated by post ERCP pancreatitis  Patient has persistent significant active pancreatitis on CT done 06/20/2017 some areas of poor enhancement.  He has been unable to tolerate p.o.'s for the past week, pain postprandially [place] secondary to the duodenal cancer and persistent pancreatitis   PICC  line placed yesterday, TPN to start today. Will allow sips as tolerated  #2 probable diffuse pulmonary metastatic nodules and mediastinal adenopathy.-Patient is to have supraclavicular node biopsy later today to confirm  #3 anemia -multifactorial improved #4 hypokalemia-improved, continue potassium replacement #5 painful pruritic rash-etiology not clear.  Rash was present prior to admission but has progressed.  Question paraneoplastic--Benadryl has been helpful  #5 constipation-we will start Dulcolax suppositories daily as needed  Plan; as above Goal over the next days will be to get her converted to all oral meds if she can tolerate, then home on TPN with clears as tolerates, and keep  outpatient appointment with Dr.Sherrill for 06/28/2017.           Active Problems:   Adenocarcinoma of duodenum (HCC)   Pancreatitis   Hypokalemia   Dehydration   Nausea and vomiting   Acute pancreatitis   Pulmonary nodules     LOS: 6 days   [name]  06/22/2018, 10:59 AM    I have discussed the case with the PA, and that is the plan I formulated. I personally interviewed and examined the patient.  CC: post ercp pancreatitis  She is stable.  Still unable to tolerate much other than small amount of clear liquid.  PICC placed - TPN to start tonight.  Has a rash of unclear cause that reportedly improved with benadryl.  Stay on clear liquid diet, and we will see again on Monday.  Call sooner if needed.    [name] Office: [phone]

## 2018-06-22 NOTE — Progress Notes (Signed)
Patient returned from procedure, Biopsy site band aid, small stain. Will continue to monitor

## 2018-06-22 NOTE — Progress Notes (Signed)
PHARMACY - ADULT TOTAL PARENTERAL NUTRITION CONSULT NOTE   Pharmacy Consult for TPN Indication: Severe pancreatitis  Patient Measurements: Height: 5\' 11"  (180.3 cm) Weight: 226 lb 14.4 oz (102.9 kg) IBW/kg (Calculated) : 70.8   Body mass index is 31.65 kg/m. Usual Weight: Most recent weight 105 kg on 2/2 Current weight: 103 kg  Assessment:   Insulin Requirements: n/a  Current Nutrition: NPO  IVF: D5 1/2NS with KCl 40 mEq/L at 100 mL/hr per MD  Central access: Double lumen PICC placed 2/6 TPN start date: 2/7  ASSESSMENT                                                                                              HPI: Pt has PMH significant for 1 month of nausea, decreased appetite, abdominal pain, and weight loss. Underwent ERCP and biliary stent placement on 1/30 with finding of a large ulcerated duodenal mass. Pt presented on 2/1 with post-ERCP pancreatitis. Pt has been unable to tolerate PO intake during admission and has had minimal PO intake for several weeks. Pharmacy consulted to dose TPN.  Significant events:  -1/30: ERCP with biliary stent placement -2/6: PICC placed -2/7: Initiate TPN  Today:    Glucose - FBG 124. Pt does not have DM.  Electrolytes -   Na (136), Cl (104), Phos (4.2), Corrected Ca (8.9), Mg (2.2) - WNL  K (3.4) - slightly low but improved with electrolyte replacement. Remains on maintenance fluids w/KCl per MD.  Renal - SCr stable, WNL  LFTs - Slightly elevated (AST 57, ALT 95)  TGs - WNL (81)  Prealbumin - 7.2, low  NUTRITIONAL GOALS                                                                                 RD recs per note on 2/7: Kcal: 2200 - 2400 kcal Protein: 110 - 120 g Fluid: >/= 2.2 L/day  TPN at goal rate of 92 mL/hr will provide: 2330 kcal, 117 g protein, and 353 g dextrose, meeting 100% of patient needs  PLAN                                                                                                                At 1800 today:  Start TPN at 30 mL/hr. Per discussion with RD, pt has had minimal  PO intake for several weeks and is at risk for refeeding syndrome. Will initiate TPN at low rate.   Plan to advance as tolerated to the goal rate.  TPN to contain standard multivitamins and trace elements.  TPN to contain standard concentration of electrolytes  Reduce IVF to 70 mL/hr. Will remove K from maintenance fluids.  Add q6h CBG checks and sensitive SSI.   TPN lab panels on Mondays & Thursdays.  Check electrolytes tomorrow morning.  F/u daily.  Lenis Noon, PharmD 06/22/18 11:21 AM

## 2018-06-22 NOTE — Progress Notes (Signed)
PROGRESS NOTE    Hailey Houston  ENI:[id number] DOB: 08/22/1970 DOA: 06/16/2018 PCP: Patient, No Pcp Per    Brief Narrative:  48 y.o.femalewith medical history significant of ADHD who presented 2/1 with post-ERCP pancreatitis. ERCP was on 1/30 in investigation of 1 month of nausea, decreased appetite, abdominal pain and weight loss with elevated LFTs and pancreatic head mass with intr- and extra-hepatic ductal dilatation. There was some concern for metastatic spread with pulmonary nodules and porta hepatis gastrohepatic area and retroperitoneal lymphadenopathy. ERCP found severely abnormal duodenum causing a distal CBD stricture she had a limited biliary sphincterectomy and balloon dilatation. Biopsies were obtained, and biliary stent placed. Since admission diet has been restricted and IVF's given with GI consulted.   Assessment & Plan:   Active Problems:   Adenocarcinoma of duodenum (HCC)   Pancreatitis   Hypokalemia   Dehydration   Nausea and vomiting   Acute pancreatitis   Pulmonary nodules   Malnutrition of moderate degree   Adenocarcinoma of the duodenum She was found to have mets throughout the lungs and lymph nodes. Oncologist Dr. Benay Spice consulted and plan for lymph node biopsy today of the supraclavicular lymph node. Symptomatic management of the nausea vomiting and itching with IV Zofran and Benadryl.   Post ERCP pancreatitis CT on 06/20/2018 consistent with forming necrosis. GI consulted and recommendations given. N.p.o. for bowel rest.  TPN for protracted recovery and moderate malnutrition.   Hypokalemia Replaced.  Recheck tomorrow.  Dehydration Continue with IV fluids.    DVT prophylaxis: Lovenox  code Status: Full code Family Communication: Family at bedside  disposition Plan: Pending lymph node biopsy.   Consultants:   IR  Gastroenterologist Dr. Loletha Carrow  Oncology Dr. Benay Spice  Procedures: Supraclavicular lymph node biopsy by IR scheduled  today  Antimicrobials: None Subjective: Reports nauseated and has itching all over the body.  She continues to have abdominal pain.  No vomiting this morning.  Objective: Vitals:   06/21/18 1400 06/21/18 2046 06/22/18 0435 06/22/18 1311  BP:  (!) 149/95 140/82 (!) 150/94  Pulse:  87 75 88  Resp:  20 20 15   Temp:  99.3 F (37.4 C) 98.8 F (37.1 C) 97.6 F (36.4 C)  TempSrc:  Oral Oral Oral  SpO2:  97% 100% 98%  Weight: 103.1 kg  102.9 kg   Height:        Intake/Output Summary (Last 24 hours) at 06/22/2018 1348 Last data filed at 06/22/2018 0900 Gross per 24 hour  Intake 1693.73 ml  Output -  Net 1693.73 ml   Filed Weights   06/17/18 0049 06/21/18 1400 06/22/18 0435  Weight: 105.3 kg 103.1 kg 102.9 kg    Examination:  General exam: She is appearing anxious but not in any distress Respiratory system: Clear to auscultation. Respiratory effort normal. Cardiovascular system: S1 & S2 heard, RRR. No JVD, murmurs, rubs, gallops or clicks. No pedal edema. Gastrointestinal system: Abdomen is soft, mildly tender generalized, nondistended, bowel sounds are good Central nervous system: Alert and oriented. No focal neurological deficits. Extremities: Symmetric 5 x 5 power. Skin: No rashes, lesions or ulcers Psychiatry:  Mood & affect appropriate.     Data Reviewed: I have personally reviewed following labs and imaging studies  CBC: Recent Labs  Lab 06/16/18 1549 06/17/18 0515 06/20/18 0558 06/21/18 0523 06/22/18 0423  WBC 10.6* 6.2 4.6 4.5 4.9  NEUTROABS 7.7  --   --   --  2.8  HGB 10.8* 9.4* 9.0* 9.3* 9.3*  HCT 33.8* 29.9*  27.8* 28.8* 29.0*  MCV 98.5 98.4 98.6 97.6 98.6  PLT 382 348 344 395 476*   Basic Metabolic Panel: Recent Labs  Lab 06/16/18 1549 06/17/18 0515 06/18/18 0521 06/20/18 0558 06/21/18 0523 06/22/18 0423  NA 136 136 134* 137 137 136  K 3.0* 3.5 3.5 3.1* 2.7* 3.4*  CL 101 106 104 103 103 104  CO2 25 22 23 26 26 25   GLUCOSE 107* 100* 86 118*  124* 124*  BUN 13 12 9  5* <5* <5*  CREATININE 0.69 0.51 0.53 0.51 0.47 0.50  CALCIUM 8.9 8.2* 8.4* 8.5* 8.4* 8.3*  MG 1.9 1.8  --   --   --  2.2  PHOS 3.7 3.9  --   --  3.9 4.2   GFR: Estimated Creatinine Clearance: 117.2 mL/min (by C-G formula based on SCr of 0.5 mg/dL). Liver Function Tests: Recent Labs  Lab 06/18/18 0521 06/19/18 0554 06/20/18 0558 06/21/18 0523 06/22/18 0423  AST 48* 42* 43* 49* 57*  ALT 176* 139* 112* 101* 95*  ALKPHOS 186* 170* 155* 149* 142*  BILITOT 1.9* 1.9* 1.6* 1.8* 1.8*  PROT 6.3* 6.5 6.2* 6.5 6.2*  ALBUMIN 3.2* 3.2* 3.0* 3.2* 3.2*   Recent Labs  Lab 06/16/18 1549 06/17/18 0515 06/18/18 0521 06/19/18 0554 06/20/18 0558  LIPASE 348* 204* 102* 88* 94*   No results for input(s): AMMONIA in the last 168 hours. Coagulation Profile: Recent Labs  Lab 06/16/18 1901  INR 1.05   Cardiac Enzymes: No results for input(s): CKTOTAL, CKMB, CKMBINDEX, TROPONINI in the last 168 hours. BNP (last 3 results) No results for input(s): PROBNP in the last 8760 hours. HbA1C: No results for input(s): HGBA1C in the last 72 hours. CBG: No results for input(s): GLUCAP in the last 168 hours. Lipid Profile: Recent Labs    06/22/18 0423  TRIG 81   Thyroid Function Tests: No results for input(s): TSH, T4TOTAL, FREET4, T3FREE, THYROIDAB in the last 72 hours. Anemia Panel: No results for input(s): VITAMINB12, FOLATE, FERRITIN, TIBC, IRON, RETICCTPCT in the last 72 hours. Sepsis Labs: No results for input(s): PROCALCITON, LATICACIDVEN in the last 168 hours.  No results found for this or any previous visit (from the past 240 hour(s)).       Radiology Studies: Ct Chest W Contrast  Result Date: 06/20/2018 CLINICAL DATA:  Malignant biliary obstruction status post ERCP and common bile duct stent placement 06/14/2018. Large ulcerated duodenal mass was identified. Evaluate for possible lung nodules. EXAM: CT CHEST WITH CONTRAST TECHNIQUE: Multidetector CT  imaging of the chest was performed during intravenous contrast administration. CONTRAST:  23mL OMNIPAQUE IOHEXOL 300 MG/ML  SOLN COMPARISON:  CT abdomen 06/16/2018 FINDINGS: Cardiovascular: The heart is normal in size. No pericardial effusion. The aorta is normal in caliber. No dissection. The branch vessels are patent. No definite coronary artery calcifications. Mediastinum/Nodes: 9.5 mm right subclavicular lymph node adjacent to the left thyroid lobe is suspicious for malignant adenopathy. Slightly more inferiorly is an 8 mm node between the left carotid artery and the left subclavian artery. Mediastinal lymphadenopathy, worrisome for metastatic disease. 10 mm right hilar node on image number 61. 10 mm subcarinal lymph node on image number 62. 7 mm right paratracheal node on image number 49. The esophagus is grossly normal. Lungs/Pleura: Numerous bilateral pulmonary nodules consistent with metastatic disease. Some of these demonstrate mild cavitation. Index node in the right upper lobe on image number 64 measures 9.5 mm. Index node in the left lower lobe on image number 120 measures  8 mm. 7 mm right lower lobe on image number 75. No acute pulmonary findings.  No pleural effusion. Upper Abdomen: There is a common bile duct stent in place. There is New significant inflammatory change involving the pancreas and peripancreatic tissues suggesting acute pancreatitis. Is also inflammation and fluid extending into the periportal region and into the anterior pararenal fossa bilaterally. High attenuation lesions in the gallbladder, likely gallstones. Pneumobilia associated with the common bile duct stent. Musculoskeletal: No breast masses, chest wall mass or axillary adenopathy. IMPRESSION: 1. Diffuse metastatic pulmonary nodules in both lungs. 2. Subclavicular, mediastinal and hilar lymph nodes suspicious for metastatic disease. 3. Acute pancreatitis. Poor enhancement of the pancreas could suggest pancreatic necrosis.  These results will be called to the ordering clinician or representative by the Radiologist Assistant, and communication documented in the PACS or zVision Dashboard. Electronically Signed   By: Marijo Sanes M.D.   On: 06/20/2018 17:59   [place] Ekg Site Rite  Result Date: 06/21/2018 If Site Rite image not attached, placement could not be confirmed due to current cardiac rhythm.       Scheduled Meds: . bisacodyl  10 mg Rectal Once  . clotrimazole  1 Applicatorful Vaginal QHS  . enoxaparin (LOVENOX) injection  40 mg Subcutaneous Q24H  . insulin aspart  0-9 Units Subcutaneous Q6H  . pantoprazole (PROTONIX) IV  40 mg Intravenous Q24H  . triamcinolone cream   Topical TID   Continuous Infusions: . dextrose 5 % and 0.45 % NaCl with KCl 40 mEq/L 100 mL/hr at 06/22/18 0500  . dextrose 5 % and 0.45% NaCl    . TPN ADULT (ION)       LOS: 6 days    Time spent: 32 minutes    Hosie Poisson, MD [facility] Pager [phone]  If 7PM-7AM, please contact night-coverage [email or web address] Password [email or web address] 06/22/2018, 1:48 PM

## 2018-06-22 NOTE — Procedures (Signed)
Interventional Radiology Procedure Note  Procedure: Successful US guided core biopsy of left supraclavicular LN.   Complications: None  Estimated Blood Loss: None  Recommendations: - Return to room - Path pending  Signed,  Criselda Peaches, MD

## 2018-06-23 LAB — GLUCOSE, CAPILLARY
Glucose-Capillary: 106 mg/dL — ABNORMAL HIGH (ref 70–99)
Glucose-Capillary: 116 mg/dL — ABNORMAL HIGH (ref 70–99)
Glucose-Capillary: 118 mg/dL — ABNORMAL HIGH (ref 70–99)
Glucose-Capillary: 132 mg/dL — ABNORMAL HIGH (ref 70–99)
Glucose-Capillary: 98 mg/dL (ref 70–99)

## 2018-06-23 LAB — MAGNESIUM: Magnesium: 2.1 mg/dL (ref 1.7–2.4)

## 2018-06-23 LAB — BASIC METABOLIC PANEL
Anion gap: 7 (ref 5–15)
BUN: <5 mg/dL — ABNORMAL LOW (ref 6–20)
CALCIUM: 8.6 mg/dL — AB (ref 8.9–10.3)
CO2: 26 mmol/L (ref 22–32)
CREATININE: 0.57 mg/dL (ref 0.44–1.00)
Chloride: 104 mmol/L (ref 98–111)
GFR calc Af Amer: >60 mL/min (ref >60)
GFR calc non Af Amer: >60 mL/min (ref >60)
Glucose, Bld: 135 mg/dL — ABNORMAL HIGH (ref 70–99)
Potassium: 3.6 mmol/L (ref 3.5–5.1)
Sodium: 137 mmol/L (ref 135–145)

## 2018-06-23 LAB — PHOSPHORUS: Phosphorus: 4.9 mg/dL — ABNORMAL HIGH (ref 2.5–4.6)

## 2018-06-23 MED ORDER — HYDROMORPHONE HCL 2 MG PO TABS
6.0000 mg | ORAL_TABLET | ORAL | Status: DC | PRN
  Administered 2018-06-25: 6 mg via ORAL
  Filled 2018-06-23: qty 3

## 2018-06-23 MED ORDER — TRAVASOL 10 % IV SOLN
INTRAVENOUS | Status: AC
  Administered 2018-06-23: 18:00:00 via INTRAVENOUS
  Filled 2018-06-23: qty 508.8

## 2018-06-23 MED ORDER — CLOBETASOL PROPIONATE 0.05 % EX CREA
TOPICAL_CREAM | Freq: Two times a day (BID) | CUTANEOUS | Status: DC
  Administered 2018-06-23 – 2018-06-25 (×5): via TOPICAL
  Administered 2018-06-25: 1 via TOPICAL
  Administered 2018-06-26 – 2018-06-30 (×9): via TOPICAL
  Filled 2018-06-23 (×3): qty 15

## 2018-06-23 MED ORDER — DEXTROSE-NACL 5-0.45 % IV SOLN
INTRAVENOUS | Status: AC
  Administered 2018-06-23 – 2018-06-24 (×2): via INTRAVENOUS

## 2018-06-23 NOTE — Progress Notes (Addendum)
PROGRESS NOTE  Hailey Houston  SNK:[id number] DOB: [birth year] DOA: 06/16/2018 PCP: Patient, No Pcp Per   Brief Narrative: 48 y.o.femalewith medical history significant of ADHD who presented 2/1 with post-ERCP pancreatitis. ERCP was on 1/30 in investigation of 1 month of nausea, decreased appetite, abdominal pain and weight loss with elevated LFTs and pancreatic head mass with intr- and extra-hepatic ductal dilatation. There was some concern for metastatic spread with pulmonary nodules and porta hepatis gastrohepatic area and retroperitoneal lymphadenopathy. ERCP found severely abnormal duodenum causing a distal CBD stricture she had a limited biliary sphincterectomy and balloon dilatation. Biopsies were obtained, and biliary stent placed. Since admission diet has been restricted and IVF's given with GI consulted.   Assessment & Plan: Active Problems:   Adenocarcinoma of duodenum (HCC)   Pancreatitis   Hypokalemia   Dehydration   Nausea and vomiting   Acute pancreatitis   Pulmonary nodules   Malnutrition of moderate degree  Adenocarcinoma of duodenum: with CT findings suggestive of metastases throughout lungs and lymph nodes.  - Oncology, Dr. Benay Spice consulted. Has provided information regarding chemotherapy and mentioned additional biopsies of lymph nodes. Discussed with him today, would like to prove this is metastatic disease with biopsy. I've consulted IR for biopsy, likely target would be supraclavicular node. - Follow up pathology from left supraclavicular LN biopsy.  Post-ERCP pancreatitis: With limited contrast enhancement on CT 2/5, consistent with forming necrosis.  - Appreciate GI recommendations. Planning to discharge on TPN with oral medications once tolerating. - Augment dilaudid po, continue prn IV dosing. - Inserted PICC and started TPN due to anticipated protracted recovery and need for optimization with chemotherapy forthcoming. - Continue supportive care with IVF,  analgesics, etc.   Hypokalemia:  - Supplement  Dehydration:  - Continue IVF, tapering down with TPN up.  Rash: Unclear etiology, though may be worsened by anxiety. ?Paraneoplastic vs. contact irritant/allergic dermatitis. - Continue tiramcinolone for most lesions, can use clobetasol for worst locations, occlude limited areas like right forearm with gauze. Continue benadryl, prefer po formulation if tolerable.   DVT prophylaxis: Lovenox subQ Code Status: Full Family Communication: Sister at bedside Disposition Plan: Uncertain, pending ability to take only oral medications and stability on TPN.  Consultants:   GI  Oncology  Procedures:   None  Antimicrobials:  None   Subjective: Benadryl has helped itching, cream also helps but still significantly irritating to limit sleep. Pain is better with IV dilaudid but oral dose is not helpful at all. Took some ice chips last night and that made for a "bad night," with severe pain.   Objective: Vitals:   06/22/18 1825 06/22/18 2038 06/23/18 0445 06/23/18 1509  BP: (!) 146/98 (!) 153/81 126/76 (!) 146/85  Pulse: 93 89 83 88  Resp: 16 18 20 18   Temp: 98 F (36.7 C) 98.3 F (36.8 C) 98.2 F (36.8 C) 98.3 F (36.8 C)  TempSrc: Oral Oral Oral Oral  SpO2: 99% 99% 100% 100%  Weight:      Height:        Intake/Output Summary (Last 24 hours) at 06/23/2018 1557 Last data filed at 06/23/2018 0354 Gross per 24 hour  Intake 2000.15 ml  Output -  Net 2000.15 ml   Filed Weights   06/17/18 0049 06/21/18 1400 06/22/18 0435  Weight: 105.3 kg 103.1 kg 102.9 kg   Gen: 48 y.o. female in no distress Pulm: Nonlabored breathing room air. Clear. CV: Regular rate and rhythm. No murmur, rub, or gallop. No JVD, no  dependent edema. GI: Abdomen soft, tender diffusely, worst in epigastrium to light palpation with voluntary guarding, non-distended, with normoactive bowel sounds.  Ext: Warm, no deformities Skin: Maculopapular erythema eruptions  across upper abdomen/lower chest and lower back less so in mid back. Right forearm with stable erythema, pruritic.  Neuro: Alert and oriented. No focal neurological deficits. Psych: Judgement and insight appear fair. Mood euthymic & affect congruent. Behavior is appropriate.    Data Reviewed: I have personally reviewed following labs and imaging studies  CBC: Recent Labs  Lab 06/17/18 0515 06/20/18 0558 06/21/18 0523 06/22/18 0423  WBC 6.2 4.6 4.5 4.9  NEUTROABS  --   --   --  2.8  HGB 9.4* 9.0* 9.3* 9.3*  HCT 29.9* 27.8* 28.8* 29.0*  MCV 98.4 98.6 97.6 98.6  PLT 348 344 395 355*   Basic Metabolic Panel: Recent Labs  Lab 06/17/18 0515 06/18/18 0521 06/20/18 0558 06/21/18 0523 06/22/18 0423 06/23/18 0353  NA 136 134* 137 137 136 137  K 3.5 3.5 3.1* 2.7* 3.4* 3.6  CL 106 104 103 103 104 104  CO2 22 23 26 26 25 26   GLUCOSE 100* 86 118* 124* 124* 135*  BUN 12 9 5* <5* <5* <5*  CREATININE 0.51 0.53 0.51 0.47 0.50 0.57  CALCIUM 8.2* 8.4* 8.5* 8.4* 8.3* 8.6*  MG 1.8  --   --   --  2.2 2.1  PHOS 3.9  --   --  3.9 4.2 4.9*   GFR: Estimated Creatinine Clearance: 117.2 mL/min (by C-G formula based on SCr of 0.57 mg/dL). Liver Function Tests: Recent Labs  Lab 06/18/18 0521 06/19/18 0554 06/20/18 0558 06/21/18 0523 06/22/18 0423  AST 48* 42* 43* 49* 57*  ALT 176* 139* 112* 101* 95*  ALKPHOS 186* 170* 155* 149* 142*  BILITOT 1.9* 1.9* 1.6* 1.8* 1.8*  PROT 6.3* 6.5 6.2* 6.5 6.2*  ALBUMIN 3.2* 3.2* 3.0* 3.2* 3.2*   Recent Labs  Lab 06/17/18 0515 06/18/18 0521 06/19/18 0554 06/20/18 0558  LIPASE 204* 102* 88* 94*   Coagulation Profile: Recent Labs  Lab 06/16/18 1901  INR 1.05   CBG: Recent Labs  Lab 06/22/18 1814 06/23/18 0055 06/23/18 0443 06/23/18 1115  GLUCAP 101* 132* 116* 98   Lipid Profile: Recent Labs    06/22/18 0423  TRIG 81   Radiology Studies: [place] Core Biopsy (lymph Nodes)  Result Date: 06/22/2018 INDICATION: 48 year old female with a  history of duodenal adenocarcinoma and suspicious left supraclavicular lymphadenopathy. Of note, she does have a brother who was recently diagnosed with sarcoidosis. She presents for ultrasound-guided core biopsy to confirm tissue diagnosis. EXAM: Ultrasound-guided core biopsy of left supraclavicular lymph node. MEDICATIONS: None. ANESTHESIA/SEDATION: None FLUOROSCOPY TIME:  None COMPLICATIONS: None immediate. PROCEDURE: Informed written consent was obtained from the patient after a thorough discussion of the procedural risks, benefits and alternatives. All questions were addressed. A timeout was performed prior to the initiation of the procedure. Ultrasound was used to interrogate the left supraclavicular fossa. A 1.2 x 0.9 cm hypoechoic lymph node is evident posterior to the jugular vein and adjacent to the carotid artery. A suitable window could be identified. The overlying skin was sterilely prepped and draped in the standard sterile fashion using chlorhexidine skin prep. Local anesthesia was attained by infiltration with 1% lidocaine. A small dermatotomy was made. Under real time sonographic guidance, multiple 18 gauge core biopsies were obtained using the bio Pince automated biopsy device. The biopsy specimens were placed in saline and sent to pathology  for further analysis. Post biopsy ultrasound imaging demonstrates no evidence of immediate complication. IMPRESSION: Technically successful ultrasound-guided core biopsy of left supraclavicular lymph node. Electronically Signed   By: Jacqulynn Cadet M.D.   On: 06/22/2018 17:44    Scheduled Meds: . bisacodyl  10 mg Rectal Once  . clobetasol cream   Topical BID  . clotrimazole  1 Applicatorful Vaginal QHS  . enoxaparin (LOVENOX) injection  40 mg Subcutaneous Q24H  . insulin aspart  0-9 Units Subcutaneous Q6H  . pantoprazole (PROTONIX) IV  40 mg Intravenous Q24H  . triamcinolone cream   Topical TID   Continuous Infusions: . dextrose 5 % and 0.45%  NaCl 70 mL/hr at 06/23/18 0805  . dextrose 5 % and 0.45% NaCl    . TPN ADULT (ION) 30 mL/hr at 06/22/18 1800  . TPN ADULT (ION)       LOS: 7 days   Time spent: 35 minutes.  Patrecia Pour, MD [facility] [email or web address] Password [facility] 06/23/2018, 3:57 PM

## 2018-06-23 NOTE — Progress Notes (Signed)
PHARMACY - ADULT TOTAL PARENTERAL NUTRITION CONSULT NOTE   Pharmacy Consult for TPN Indication: Severe pancreatitis  Patient Measurements: Height: 5\' 11"  (180.3 cm) Weight: 226 lb 14.4 oz (102.9 kg) IBW/kg (Calculated) : 70.8   Body mass index is 31.65 kg/m. Usual Weight: Most recent weight 105 kg on 2/2 Current weight: 103 kg  Insulin Requirements: n/a  Current Nutrition: NPO  IVF: D5 1/2NS at 90 mL/hr per MD  Central access: Double lumen PICC placed 2/6 TPN start date: 2/7  ASSESSMENT                                                                                              HPI: Pt has PMH significant for 1 month of nausea, decreased appetite, abdominal pain, and weight loss. Underwent ERCP and biliary stent placement on 1/30 with finding of a large ulcerated duodenal mass. Pt presented on 2/1 with post-ERCP pancreatitis. Pt has been unable to tolerate PO intake during admission and has had minimal PO intake for several weeks. Pharmacy consulted to dose TPN.  Significant events:  -1/30: ERCP with biliary stent placement -2/6: PICC placed -2/7: Initiate TPN  Today:    Glucose - CBG range 101-116. Pt does not have DM.  Electrolytes -   Na (137), Cl (104), Phos (4.9, slightly elevated), Corrected Ca (9.24), Mg (2.1) - WNL  K (3.6) - KCl removed from IVF yesterday.  Renal - SCr stable, WNL  LFTs - Slightly elevated (AST 57, ALT 95) on 2/7  TGs - WNL (81)  Prealbumin - 7.2, low  NUTRITIONAL GOALS                                                                                 RD recs per note on 2/7: Kcal: 2200 - 2400 kcal Protein: 110 - 120 g Fluid: >/= 2.2 L/day  TPN at goal rate of 92 mL/hr will provide: 2330 kcal, 117 g protein, and 353 g dextrose, meeting 100% of patient needs  PLAN                                                                                                               At 1800 today:  Increase TPN to 40 mL/hr, slow advancement -  per discussion with RD, pt has had minimal PO intake for several weeks and is at  risk for refeeding syndrome.   Plan to advance as tolerated to the goal rate.  TPN to contain standard multivitamins and trace elements.  TPN to contain standard concentration of electrolytes except  Increase potassium to 60 meq/L  Decrease phosphorus to 10 mmol/L  Reduce IVF to 60 mL/hr.   CBG q6h checks and sensitive SSI.   TPN lab panels on Mondays & Thursdays.  Check electrolytes tomorrow morning.  F/u daily.  Peggyann Juba, PharmD, BCPS Pager: [phone] 06/23/18 8:31 AM

## 2018-06-23 NOTE — Plan of Care (Signed)
  Problem: Health Behavior/Discharge Planning: Goal: Ability to manage health-related needs will improve Outcome: Progressing   

## 2018-06-24 LAB — MAGNESIUM: Magnesium: 2 mg/dL (ref 1.7–2.4)

## 2018-06-24 LAB — GLUCOSE, CAPILLARY
GLUCOSE-CAPILLARY: 122 mg/dL — AB (ref 70–99)
Glucose-Capillary: 109 mg/dL — ABNORMAL HIGH (ref 70–99)
Glucose-Capillary: 110 mg/dL — ABNORMAL HIGH (ref 70–99)
Glucose-Capillary: 113 mg/dL — ABNORMAL HIGH (ref 70–99)

## 2018-06-24 LAB — COMPREHENSIVE METABOLIC PANEL
ALT: 72 U/L — ABNORMAL HIGH (ref 0–44)
AST: 41 U/L (ref 15–41)
Albumin: 3.3 g/dL — ABNORMAL LOW (ref 3.5–5.0)
Alkaline Phosphatase: 127 U/L — ABNORMAL HIGH (ref 38–126)
Anion gap: 9 (ref 5–15)
BUN: 8 mg/dL (ref 6–20)
CO2: 25 mmol/L (ref 22–32)
Calcium: 8.9 mg/dL (ref 8.9–10.3)
Chloride: 104 mmol/L (ref 98–111)
Creatinine, Ser: 0.53 mg/dL (ref 0.44–1.00)
GFR calc Af Amer: >60 mL/min (ref >60)
GFR calc non Af Amer: >60 mL/min (ref >60)
Glucose, Bld: 115 mg/dL — ABNORMAL HIGH (ref 70–99)
POTASSIUM: 3.8 mmol/L (ref 3.5–5.1)
SODIUM: 138 mmol/L (ref 135–145)
Total Bilirubin: 1.4 mg/dL — ABNORMAL HIGH (ref 0.3–1.2)
Total Protein: 6.4 g/dL — ABNORMAL LOW (ref 6.5–8.1)

## 2018-06-24 LAB — PHOSPHORUS: Phosphorus: 4.8 mg/dL — ABNORMAL HIGH (ref 2.5–4.6)

## 2018-06-24 MED ORDER — TRAVASOL 10 % IV SOLN
INTRAVENOUS | Status: DC
  Administered 2018-06-24: 19:00:00 via INTRAVENOUS
  Filled 2018-06-24: qty 763.2

## 2018-06-24 MED ORDER — DEXTROSE-NACL 5-0.45 % IV SOLN
INTRAVENOUS | Status: AC
  Administered 2018-06-25: 08:00:00 via INTRAVENOUS

## 2018-06-24 MED ORDER — PROMETHAZINE HCL 25 MG/ML IJ SOLN
12.5000 mg | Freq: Four times a day (QID) | INTRAMUSCULAR | Status: AC | PRN
  Administered 2018-06-24: 12.5 mg via INTRAVENOUS
  Administered 2018-06-25: 25 mg via INTRAVENOUS
  Filled 2018-06-24 (×2): qty 1

## 2018-06-24 MED ORDER — ALBUTEROL SULFATE (2.5 MG/3ML) 0.083% IN NEBU
INHALATION_SOLUTION | RESPIRATORY_TRACT | Status: AC
  Filled 2018-06-24: qty 3

## 2018-06-24 NOTE — Progress Notes (Signed)
PROGRESS NOTE  EVALETTE MONTROSE  ZSW:[id number] DOB: 23-Mar-1971 DOA: 06/16/2018 PCP: Patient, No Pcp Per   Brief Narrative: 48 y.o.femalewith medical history significant of ADHD who presented 2/1 with post-ERCP pancreatitis. ERCP was on 1/30 in investigation of 1 month of nausea, decreased appetite, abdominal pain and weight loss with elevated LFTs and pancreatic head mass with intr- and extra-hepatic ductal dilatation. There was some concern for metastatic spread with pulmonary nodules and porta hepatis gastrohepatic area and retroperitoneal lymphadenopathy. ERCP found severely abnormal duodenum causing a distal CBD stricture she had a limited biliary sphincterectomy and balloon dilatation. Biopsies were obtained, and biliary stent placed. Since admission diet has been restricted and IVF's given with GI consulted.   Assessment & Plan: Active Problems:   Adenocarcinoma of duodenum (HCC)   Pancreatitis   Hypokalemia   Dehydration   Nausea and vomiting   Acute pancreatitis   Pulmonary nodules   Malnutrition of moderate degree  Adenocarcinoma of duodenum: with CT findings suggestive of metastases throughout lungs and lymph nodes.  - Oncology, Dr. Benay Spice consulted. - Follow up pathology from left supraclavicular LN biopsy.  Post-ERCP pancreatitis: With limited contrast enhancement on CT 2/5, consistent with forming necrosis. Severe, not improving as expected. - Appreciate GI recommendations. Planning to discharge on TPN with oral medications once tolerating. - Will give dilaudid 6mg  po prn with IV still available as breakthrough. Discussed plan to get this dose right prior to discharge.  - Inserted PICC and started TPN due to anticipated protracted recovery and need for optimization with chemotherapy forthcoming. - Continue supportive care with IVF, analgesics, etc.   Hypokalemia:  - Supplement  Dehydration:  - Continue IVF, tapering down with TPN up.  Moderate protein-calorie  malnutrition:  - Supplement with protein through TPN currently.   Rash: Unclear etiology, though may be worsened by anxiety. ?Paraneoplastic vs. contact irritant/allergic dermatitis. Clinically appears very improved. - Improving with topical steroid, po antihistamine. Improved control with augmentation of steroid. Will continue clobetasol prn for short duration. Continue po benadryl as well.   DVT prophylaxis: Lovenox  Code Status: Full Family Communication: None at bedside Disposition Plan: Uncertain, pending ability to take only oral medications and stability on TPN. Possible discharge in next 24-48 hours.  Consultants:   GI  Oncology  Procedures:   None  Antimicrobials:  None   Subjective: Rash significantly improved, tolerating po benadryl as well. Tolerated po dilaudid at 4mg  dose but didn't feel like it helped at all. IV dilaudid relieved severe, constant, waxing/waning epigastric pain radiating bilaterally around to the back. Suppository worsened abd pain last night, no BM.  Objective: Vitals:   06/23/18 0445 06/23/18 1509 06/23/18 2339 06/24/18 0643  BP: 126/76 (!) 146/85 140/79 (!) 143/92  Pulse: 83 88 88 87  Resp: 20 18 16 18   Temp: 98.2 F (36.8 C) 98.3 F (36.8 C) 98.7 F (37.1 C) 98.2 F (36.8 C)  TempSrc: Oral Oral Oral Oral  SpO2: 100% 100% 95% 100%  Weight:    103.8 kg  Height:        Intake/Output Summary (Last 24 hours) at 06/24/2018 1453 Last data filed at 06/24/2018 0600 Gross per 24 hour  Intake 1557.62 ml  Output -  Net 1557.62 ml   Filed Weights   06/21/18 1400 06/22/18 0435 06/24/18 0643  Weight: 103.1 kg 102.9 kg 103.8 kg   Gen: 48 y.o. female in no distress Pulm: Nonlabored breathing room air. Clear. CV: Regular rate and rhythm. No murmur, rub, or gallop.  No JVD, no dependent edema. GI: Abdomen soft, no tenderness in LLQ, RLQ, +tenderness to light palpation in epigastrium and upper quadrants with no rebound or guarding. + very  hypoactive bowel sounds.  Ext: Warm, no deformities Skin: Maculopapular eruption across back and upper abdomen largely resolved, improving without induration or fluctuance. No other new rashes, lesions or ulcers on visualized skin. Neuro: Alert and oriented. No focal neurological deficits. Psych: Judgement and insight appear fair. Mood euthymic & affect congruent. Behavior is appropriate.    Data Reviewed: I have personally reviewed following labs and imaging studies  CBC: Recent Labs  Lab 06/20/18 0558 06/21/18 0523 06/22/18 0423  WBC 4.6 4.5 4.9  NEUTROABS  --   --  2.8  HGB 9.0* 9.3* 9.3*  HCT 27.8* 28.8* 29.0*  MCV 98.6 97.6 98.6  PLT 344 395 616*   Basic Metabolic Panel: Recent Labs  Lab 06/20/18 0558 06/21/18 0523 06/22/18 0423 06/23/18 0353 06/24/18 0323  NA 137 137 136 137 138  K 3.1* 2.7* 3.4* 3.6 3.8  CL 103 103 104 104 104  CO2 26 26 25 26 25   GLUCOSE 118* 124* 124* 135* 115*  BUN 5* <5* <5* <5* 8  CREATININE 0.51 0.47 0.50 0.57 0.53  CALCIUM 8.5* 8.4* 8.3* 8.6* 8.9  MG  --   --  2.2 2.1 2.0  PHOS  --  3.9 4.2 4.9* 4.8*   GFR: Estimated Creatinine Clearance: 117.8 mL/min (by C-G formula based on SCr of 0.53 mg/dL). Liver Function Tests: Recent Labs  Lab 06/19/18 0554 06/20/18 0558 06/21/18 0523 06/22/18 0423 06/24/18 0323  AST 42* 43* 49* 57* 41  ALT 139* 112* 101* 95* 72*  ALKPHOS 170* 155* 149* 142* 127*  BILITOT 1.9* 1.6* 1.8* 1.8* 1.4*  PROT 6.5 6.2* 6.5 6.2* 6.4*  ALBUMIN 3.2* 3.0* 3.2* 3.2* 3.3*   Recent Labs  Lab 06/18/18 0521 06/19/18 0554 06/20/18 0558  LIPASE 102* 88* 94*   Coagulation Profile: No results for input(s): INR, PROTIME in the last 168 hours. CBG: Recent Labs  Lab 06/23/18 1115 06/23/18 1623 06/23/18 2335 06/24/18 0639 06/24/18 1114  GLUCAP 98 106* 118* 110* 122*   Lipid Profile: Recent Labs    06/22/18 0423  TRIG 81   Radiology Studies: [place] Core Biopsy (lymph Nodes)  Result Date:  06/22/2018 INDICATION: 48 year old female with a history of duodenal adenocarcinoma and suspicious left supraclavicular lymphadenopathy. Of note, she does have a brother who was recently diagnosed with sarcoidosis. She presents for ultrasound-guided core biopsy to confirm tissue diagnosis. EXAM: Ultrasound-guided core biopsy of left supraclavicular lymph node. MEDICATIONS: None. ANESTHESIA/SEDATION: None FLUOROSCOPY TIME:  None COMPLICATIONS: None immediate. PROCEDURE: Informed written consent was obtained from the patient after a thorough discussion of the procedural risks, benefits and alternatives. All questions were addressed. A timeout was performed prior to the initiation of the procedure. Ultrasound was used to interrogate the left supraclavicular fossa. A 1.2 x 0.9 cm hypoechoic lymph node is evident posterior to the jugular vein and adjacent to the carotid artery. A suitable window could be identified. The overlying skin was sterilely prepped and draped in the standard sterile fashion using chlorhexidine skin prep. Local anesthesia was attained by infiltration with 1% lidocaine. A small dermatotomy was made. Under real time sonographic guidance, multiple 18 gauge core biopsies were obtained using the bio Pince automated biopsy device. The biopsy specimens were placed in saline and sent to pathology for further analysis. Post biopsy ultrasound imaging demonstrates no evidence of immediate complication.  IMPRESSION: Technically successful ultrasound-guided core biopsy of left supraclavicular lymph node. Electronically Signed   By: Jacqulynn Cadet M.D.   On: 06/22/2018 17:44    Scheduled Meds: . albuterol      . bisacodyl  10 mg Rectal Once  . clobetasol cream   Topical BID  . enoxaparin (LOVENOX) injection  40 mg Subcutaneous Q24H  . insulin aspart  0-9 Units Subcutaneous Q6H  . pantoprazole (PROTONIX) IV  40 mg Intravenous Q24H  . triamcinolone cream   Topical TID   Continuous Infusions: .  dextrose 5 % and 0.45% NaCl 60 mL/hr at 06/24/18 1122  . dextrose 5 % and 0.45% NaCl    . TPN ADULT (ION) 40 mL/hr at 06/23/18 1828  . TPN ADULT (ION)       LOS: 8 days   Time spent: 35 minutes.  Patrecia Pour, MD [facility] [email or web address] Password [facility] 06/24/2018, 2:53 PM

## 2018-06-24 NOTE — Progress Notes (Signed)
PHARMACY - ADULT TOTAL PARENTERAL NUTRITION CONSULT NOTE   Pharmacy Consult for TPN Indication: Severe pancreatitis  Patient Measurements: Height: 5\' 11"  (180.3 cm) Weight: 228 lb 12.8 oz (103.8 kg) IBW/kg (Calculated) : 70.8   Body mass index is 31.91 kg/m. Usual Weight: Most recent weight 105 kg on 2/2 Current weight: 103 kg  Insulin Requirements: n/a  Current Nutrition: NPO  IVF: D5 1/2NS at 60 mL/hr   Central access: Double lumen PICC placed 2/6 TPN start date: 2/7  ASSESSMENT                                                                                              HPI: Pt has PMH significant for 1 month of nausea, decreased appetite, abdominal pain, and weight loss. Underwent ERCP and biliary stent placement on 1/30 with finding of a large ulcerated duodenal mass. Pt presented on 2/1 with post-ERCP pancreatitis. Pt has been unable to tolerate PO intake during admission and has had minimal PO intake for several weeks. Pharmacy consulted to dose TPN.  Significant events:  -1/30: ERCP with biliary stent placement -2/6: PICC placed -2/7: Initiate TPN  Today:    Glucose - CBG range 98-118. Pt does not have DM.  Electrolytes -   Na (138), Cl (104), Phos (4.8, slightly elevated), Corrected Ca (9.46), Mg (2.0) - WNL  K (3.8) - KCl removed from IVF on 2/7, concentration in TPN increased yesterday.  Renal - SCr stable, WNL  LFTs - Slightly elevated (AST 41, ALT 72, AlkPhos 127, Tbili 1.4) decreased from 2/7  TGs - WNL (81), recheck in AM  Prealbumin - 7.2, low, recheck in AM  NUTRITIONAL GOALS                                                                                 RD recs per note on 2/7: Kcal: 2200 - 2400 kcal Protein: 110 - 120 g Fluid: >/= 2.2 L/day  TPN at goal rate of 92 mL/hr will provide: 2330 kcal, 117 g protein, and 353 g dextrose, meeting 100% of patient needs  PLAN                                                                                                                At 1800 today:  Increase TPN to 60 mL/hr, slow advancement -  per discussion with RD, pt has had minimal PO intake for several weeks and is at risk for refeeding syndrome.   Plan to advance as tolerated to the goal rate.  TPN to contain standard multivitamins and trace elements.  TPN to contain standard concentration of electrolytes except increased potassium 57meq/L  Remove phosphorus  Reduce IVF to 40 mL/hr.   CBG q6h checks and sensitive SSI.   TPN lab panels on Mondays & Thursdays.  F/u daily.  Peggyann Juba, PharmD, BCPS Pager: [phone] 06/24/18 8:38 AM

## 2018-06-24 NOTE — Plan of Care (Signed)
Pt received suppository this shift w/ no results

## 2018-06-25 ENCOUNTER — Inpatient Hospital Stay (HOSPITAL_COMMUNITY): Payer: 59

## 2018-06-25 DIAGNOSIS — R1013 Epigastric pain: Secondary | ICD-10-CM

## 2018-06-25 DIAGNOSIS — C179 Malignant neoplasm of small intestine, unspecified: Secondary | ICD-10-CM

## 2018-06-25 LAB — COMPREHENSIVE METABOLIC PANEL
ALT: 63 U/L — ABNORMAL HIGH (ref 0–44)
AST: 35 U/L (ref 15–41)
Albumin: 3.5 g/dL (ref 3.5–5.0)
Alkaline Phosphatase: 128 U/L — ABNORMAL HIGH (ref 38–126)
Anion gap: 9 (ref 5–15)
BUN: 12 mg/dL (ref 6–20)
CO2: 25 mmol/L (ref 22–32)
Calcium: 9.1 mg/dL (ref 8.9–10.3)
Chloride: 103 mmol/L (ref 98–111)
Creatinine, Ser: 0.61 mg/dL (ref 0.44–1.00)
GFR calc Af Amer: >60 mL/min (ref >60)
GFR calc non Af Amer: >60 mL/min (ref >60)
Glucose, Bld: 113 mg/dL — ABNORMAL HIGH (ref 70–99)
POTASSIUM: 4 mmol/L (ref 3.5–5.1)
Sodium: 137 mmol/L (ref 135–145)
Total Bilirubin: 1.3 mg/dL — ABNORMAL HIGH (ref 0.3–1.2)
Total Protein: 7.1 g/dL (ref 6.5–8.1)

## 2018-06-25 LAB — DIFFERENTIAL
Abs Immature Granulocytes: 0.04 10*3/uL (ref 0.00–0.07)
Basophils Absolute: 0 10*3/uL (ref 0.0–0.1)
Basophils Relative: 0 %
Eosinophils Absolute: 0.2 10*3/uL (ref 0.0–0.5)
Eosinophils Relative: 3 %
Immature Granulocytes: 1 %
Lymphocytes Relative: 25 %
Lymphs Abs: 1.7 10*3/uL (ref 0.7–4.0)
Monocytes Absolute: 0.8 10*3/uL (ref 0.1–1.0)
Monocytes Relative: 11 %
Neutro Abs: 4.1 10*3/uL (ref 1.7–7.7)
Neutrophils Relative %: 60 %

## 2018-06-25 LAB — GLUCOSE, CAPILLARY
Glucose-Capillary: 119 mg/dL — ABNORMAL HIGH (ref 70–99)
Glucose-Capillary: 122 mg/dL — ABNORMAL HIGH (ref 70–99)
Glucose-Capillary: 121 mg/dL — ABNORMAL HIGH (ref 70–99)

## 2018-06-25 LAB — CBC
HCT: 32.1 % — ABNORMAL LOW (ref 36.0–46.0)
Hemoglobin: 10.1 g/dL — ABNORMAL LOW (ref 12.0–15.0)
MCH: 31.2 pg (ref 26.0–34.0)
MCHC: 31.5 g/dL (ref 30.0–36.0)
MCV: 99.1 fL (ref 80.0–100.0)
Platelets: 481 10*3/uL — ABNORMAL HIGH (ref 150–400)
RBC: 3.24 MIL/uL — ABNORMAL LOW (ref 3.87–5.11)
RDW: 13.2 % (ref 11.5–15.5)
WBC: 6.9 10*3/uL (ref 4.0–10.5)
nRBC: 0 % (ref 0.0–0.2)

## 2018-06-25 LAB — PREALBUMIN: Prealbumin: 8.4 mg/dL — ABNORMAL LOW (ref 18–38)

## 2018-06-25 LAB — MAGNESIUM: Magnesium: 2.1 mg/dL (ref 1.7–2.4)

## 2018-06-25 LAB — PHOSPHORUS: Phosphorus: 4.2 mg/dL (ref 2.5–4.6)

## 2018-06-25 LAB — TRIGLYCERIDES: Triglycerides: 141 mg/dL (ref <150)

## 2018-06-25 MED ORDER — TRAVASOL 10 % IV SOLN
INTRAVENOUS | Status: AC
  Filled 2018-06-25: qty 763.2

## 2018-06-25 MED ORDER — FENTANYL 50 MCG/HR TD PT72
1.0000 | MEDICATED_PATCH | TRANSDERMAL | Status: DC
  Administered 2018-06-25: 1 via TRANSDERMAL
  Filled 2018-06-25: qty 1

## 2018-06-25 MED ORDER — TRAVASOL 10 % IV SOLN
INTRAVENOUS | Status: AC
  Administered 2018-06-25: 18:00:00 via INTRAVENOUS
  Filled 2018-06-25: qty 1017.6

## 2018-06-25 MED ORDER — DEXTROSE-NACL 5-0.45 % IV SOLN
INTRAVENOUS | Status: DC
  Administered 2018-06-25 – 2018-06-29 (×3): via INTRAVENOUS

## 2018-06-25 MED ORDER — HYDROMORPHONE HCL 2 MG PO TABS
4.0000 mg | ORAL_TABLET | ORAL | Status: DC | PRN
  Administered 2018-06-25: 4 mg via ORAL
  Administered 2018-06-25: 8 mg via ORAL
  Administered 2018-06-25: 4 mg via ORAL
  Administered 2018-06-26 – 2018-06-30 (×21): 8 mg via ORAL
  Filled 2018-06-25 (×12): qty 4
  Filled 2018-06-25: qty 2
  Filled 2018-06-25 (×4): qty 4
  Filled 2018-06-25: qty 2
  Filled 2018-06-25 (×7): qty 4

## 2018-06-25 NOTE — Progress Notes (Addendum)
IP PROGRESS NOTE  Subjective:   Hailey Houston continues to have abdominal pain.  She is receiving frequent IV Dilaudid.  She reports the oral Dilaudid did not help the pain.  She underwent biopsy of a left supraclavicular lymph node on 06/22/2018.  She complains of a pruritic rash.  She reports this predated hospital admission.  Objective: Vital signs in last 24 hours: Blood pressure (!) 141/92, pulse 87, temperature 98.3 F (36.8 C), temperature source Oral, resp. rate 16, height 5\' 11"  (1.803 m), weight 228 lb 12.8 oz (103.8 kg), SpO2 100 %.  Intake/Output from previous day: 02/09 0701 - 02/10 0700 In: 2249 [I.V.:2249] Out: -   Physical Exam:   Abdomen: Soft, tender in the mid and left upper abdomen, no mass Skin: Erythematous rash over the trunk    Lab Results: Recent Labs    06/25/18 0338  WBC 6.9  HGB 10.1*  HCT 32.1*  PLT 481*    BMET Recent Labs    06/24/18 0323 06/25/18 0338  NA 138 137  K 3.8 4.0  CL 104 103  CO2 25 25  GLUCOSE 115* 113*  BUN 8 12  CREATININE 0.53 0.61  CALCIUM 8.9 9.1    No results found for: CEA1  Studies/Results: No results found.  Medications: I have reviewed the patient's current medications.  Assessment/Plan: 1.  Adenocarcinoma of the duodenum  CT abdomen/pelvis 06/16/2018- changes of pancreatitis, fullness at the head of the pancreas without a defined mass, multiple subcentimeter peripancreatic lymph nodes, distal common bile duct stent, multiple nodules at the lung bases  EUS 06/14/2018-ulcerated mucosa/mass at the distal duodenal bulb with extension to the pancreas head, portal adenopathy, biopsy of duodenal mass-adenocarcinoma  ERCP 06/14/2018- distal duodenal bulb mass, 1 cm proximal to the ampulla causing biliary obstruction, status post placement of a metal common bile duct stent  CT chest 06/20/2018- bilateral pulmonary nodules consistent with metastatic disease, supraclavicular, mediastinal, and hilar lymphadenopathy,  changes of pancreatitis  Ultrasound-guided biopsy of a left supraclavicular lymph node 06/22/2018  2.  Biliary obstruction secondary to the duodenal mass, status post placement of a bile duct stent 06/14/2018 3.  Post ERCP pancreatitis 06/16/2018 4.  Anemia secondary to phlebotomy and multiple procedures 5.  G2, P2 6.  Nausea secondary to #1 7.  Attention deficit disorder 8.  Abdominal pain- secondary to the small bowel carcinoma versus pancreatitis  9.  Rash   Hailey Houston continues to have abdominal pain.  It is unclear whether the pain is related to the duodenal tumor or pancreatitis.  She is receiving frequent IV Dilaudid.  I recommend starting a long acting narcotic in an attempt to decrease the need for IV pain medication.  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  I will follow-up on the supraclavicular lymph node biopsy today.  I will recommend beginning FOLFOX chemotherapy if the biopsy confirms small bowel carcinoma.  We are waiting on molecular testing from the duodenal biopsy.      Recommendations: 1.  Management of pancreatitis per gastroenterology 2.  Follow-up pathology from supraclavicular lymph node biopsy today 3.  Begin a Duragesic patch  LOS: 9 days   Betsy Coder, MD   06/25/2018, 8:13 AM Addendum: The biopsy from the left supraclavicular node revealed metastatic adenocarcinoma.  I discussed the biopsy result with Hailey Houston.  She has been diagnosed with metastatic small bowel carcinoma.  She understands no therapy will be curative.  We discussed the expected prognosis and treatment options.  We are  waiting on molecular testing the duodenal biopsy.  I recommend beginning FOLFOX chemotherapy within the next few days if she remains in the hospital or as an outpatient next week. I reviewed potential toxicities associated with the FOLFOX regimen including the chance for nausea/vomiting, mucositis, diarrhea, alopecia, and hematologic toxicity.  We discussed the  sun sensitivity, rash, hyperpigmentation, and hand/foot syndrome associated with 5-fluorouracil.  We reviewed the allergic reaction and various types of neuropathy seen with oxaliplatin.  She agrees to proceed.  She reports not obtaining pain relief from the 6 mg Dilaudid dose.  The Duragesic patch has been started.  I will increase the Dilaudid dose to 8 mg as needed.

## 2018-06-25 NOTE — Progress Notes (Signed)
PROGRESS NOTE  Hailey Houston  OMB:[id number] DOB: October 14, 1970 DOA: 06/16/2018 PCP: Patient, No Pcp Per   Brief Narrative: 48 y.o.femalewith medical history significant of ADHD who presented 2/1 with post-ERCP pancreatitis. ERCP was on 1/30 in investigation of 1 month of nausea, decreased appetite, abdominal pain and weight loss with elevated LFTs and pancreatic head mass with intr- and extra-hepatic ductal dilatation. There was some concern for metastatic spread with pulmonary nodules and porta hepatis gastrohepatic area and retroperitoneal lymphadenopathy. ERCP found severely abnormal duodenum causing a distal CBD stricture she had a limited biliary sphincterectomy and balloon dilatation. Biopsies were obtained, and biliary stent placed. Since admission diet has been restricted and IVF's given with GI consulted.   Assessment & Plan: Active Problems:   Adenocarcinoma of duodenum (HCC)   Pancreatitis   Hypokalemia   Dehydration   Nausea and vomiting   Acute pancreatitis   Pulmonary nodules   Malnutrition of moderate degree   Epigastric pain  Metastatic adenocarcinoma of duodenum: to lungs and LN-proven spread.  - Oncology, Dr. Benay Spice consulted. Planning to initiate FOLFOX soon.   Post-ERCP pancreatitis: With limited contrast enhancement on CT 2/5, consistent with forming necrosis. Severe, not improving as expected. Suspect some pain is from CA. - Appreciate GI recommendations. - Pain control augmented today to dilaudid 8mg  po prn with continued rescue by IV available. Fentanyl patch 15mcg started 2/10 AM. Will titrate as able as pt reports no pain-free times. - Inserted PICC and started TPN due to anticipated protracted recovery and need for optimization with chemotherapy forthcoming. - Continue supportive care with IVF, analgesics, etc.   Constipation:  - Suppositories - AXR  Hypokalemia:  - Supplement as needed  Dehydration:  - Continue IVF, tapering down with TPN  up.  Moderate protein-calorie malnutrition:  - Supplement with protein through TPN currently.   Rash: Unclear etiology, though may be worsened by anxiety. ?Paraneoplastic vs. contact irritant/allergic dermatitis. Clinically appears very improved. - Improving with topical steroid, po antihistamine. Improved control with augmentation of steroid. Will continue clobetasol prn for short duration. Continue po benadryl as well.   DVT prophylaxis: Lovenox  Code Status: Full Family Communication: None at bedside Disposition Plan: Home once improved and stable without requiring IV pain medications. Titrating pain control aggressively.   Consultants:   GI  Oncology  Procedures:   None  Antimicrobials:  None   Subjective: Very reluctant to attempt oral pain medications due to fear that IV rescue would take too long. Abdominal pain remains severe, constant, never not in pain, at a 4/10 at the least, worst in epigastrium but sparks of worsening radiation bilaterally. Flatus only with suppository and worsened abd pain.   Objective: Vitals:   06/24/18 0643 06/24/18 1658 06/24/18 2340 06/25/18 0607  BP: (!) 143/92 131/72 (!) 146/87 (!) 141/92  Pulse: 87 84 85 87  Resp: 18 15 16 16   Temp: 98.2 F (36.8 C) 98.7 F (37.1 C) 98.9 F (37.2 C) 98.3 F (36.8 C)  TempSrc: Oral Oral Oral Oral  SpO2: 100% 93% 98% 100%  Weight: 103.8 kg     Height:        Intake/Output Summary (Last 24 hours) at 06/25/2018 1525 Last data filed at 06/25/2018 1358 Gross per 24 hour  Intake 2916.15 ml  Output 200 ml  Net 2716.15 ml   Filed Weights   06/21/18 1400 06/22/18 0435 06/24/18 0643  Weight: 103.1 kg 102.9 kg 103.8 kg   Gen: 48 y.o. female in no distress Pulm: Nonlabored  breathing room air. Clear. CV: Regular rate and rhythm. No murmur, rub, or gallop. No JVD, no dependent edema. GI: Abdomen soft, tender in epigastrium primarily, hypoactive bowel sounds. Non-distended  Ext: Warm, no  deformities Skin: No new rashes, lesions or ulcers on visualized skin. Existing rashes stable. Neuro: Alert and oriented. No focal neurological deficits. Psych: Judgement and insight appear fair. Mood euthymic & affect congruent. Behavior is appropriate.    Data Reviewed: I have personally reviewed following labs and imaging studies  CBC: Recent Labs  Lab 06/20/18 0558 06/21/18 0523 06/22/18 0423 06/25/18 0338  WBC 4.6 4.5 4.9 6.9  NEUTROABS  --   --  2.8 4.1  HGB 9.0* 9.3* 9.3* 10.1*  HCT 27.8* 28.8* 29.0* 32.1*  MCV 98.6 97.6 98.6 99.1  PLT 344 395 438* 078*   Basic Metabolic Panel: Recent Labs  Lab 06/21/18 0523 06/22/18 0423 06/23/18 0353 06/24/18 0323 06/25/18 0338  NA 137 136 137 138 137  K 2.7* 3.4* 3.6 3.8 4.0  CL 103 104 104 104 103  CO2 26 25 26 25 25   GLUCOSE 124* 124* 135* 115* 113*  BUN <5* <5* <5* 8 12  CREATININE 0.47 0.50 0.57 0.53 0.61  CALCIUM 8.4* 8.3* 8.6* 8.9 9.1  MG  --  2.2 2.1 2.0 2.1  PHOS 3.9 4.2 4.9* 4.8* 4.2   GFR: Estimated Creatinine Clearance: 117.8 mL/min (by C-G formula based on SCr of 0.61 mg/dL). Liver Function Tests: Recent Labs  Lab 06/20/18 0558 06/21/18 0523 06/22/18 0423 06/24/18 0323 06/25/18 0338  AST 43* 49* 57* 41 35  ALT 112* 101* 95* 72* 63*  ALKPHOS 155* 149* 142* 127* 128*  BILITOT 1.6* 1.8* 1.8* 1.4* 1.3*  PROT 6.2* 6.5 6.2* 6.4* 7.1  ALBUMIN 3.0* 3.2* 3.2* 3.3* 3.5   Recent Labs  Lab 06/19/18 0554 06/20/18 0558  LIPASE 88* 94*   Coagulation Profile: No results for input(s): INR, PROTIME in the last 168 hours. CBG: Recent Labs  Lab 06/24/18 1114 06/24/18 1808 06/24/18 2337 06/25/18 0604 06/25/18 1143  GLUCAP 122* 109* 113* 119* 122*   Lipid Profile: Recent Labs    06/25/18 0338  TRIG 141   Radiology Studies: No results found.  Scheduled Meds: . bisacodyl  10 mg Rectal Once  . clobetasol cream   Topical BID  . enoxaparin (LOVENOX) injection  40 mg Subcutaneous Q24H  . fentaNYL  1  patch Transdermal Q72H  . insulin aspart  0-9 Units Subcutaneous Q6H  . pantoprazole (PROTONIX) IV  40 mg Intravenous Q24H  . triamcinolone cream   Topical TID   Continuous Infusions: . dextrose 5 % and 0.45% NaCl 40 mL/hr at 06/25/18 0819  . dextrose 5 % and 0.45% NaCl    . TPN ADULT (ION)    . TPN ADULT (ION) 60 mL/hr at 06/25/18 0809     LOS: 9 days   Time spent: 35 minutes.  Patrecia Pour, MD [facility] [email or web address] Password [facility] 06/25/2018, 3:25 PM

## 2018-06-25 NOTE — Progress Notes (Addendum)
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 Generally doesn't feel well. Some bursts of upper abdominal pain. No BM. Passed gas after Dulcolax suppository. Takes 45 minutes for nurses to give pain medication.   Objective:  Vital signs in last 24 hours: Temp:  [98.3 F (36.8 C)-98.9 F (37.2 C)] 98.3 F (36.8 C) (02/10 0607) Pulse Rate:  [84-87] 87 (02/10 0607) Resp:  [15-16] 16 (02/10 0607) BP: (131-146)/(72-92) 141/92 (02/10 0607) SpO2:  [93 %-100 %] 100 % (02/10 0607) Last BM Date: 06/24/18(per pt very small, mucus like) General:   Alert,  In NAD. Heart:  Regular rate and rhythm; no murmurs Pulm: clear throughout.  Abdomen:  Soft, epigastric tenderness without rebound or guarding. Positive BS x 4 quads. Extremities:  Without edema. Neurologic:  Alert and  oriented x4;  grossly normal neurologically. Psych:  Alert and cooperative. Normal mood and affect.  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 LFTs improving. Alk  phos 128. AST 35. ALT 63. T. Bili 1.3. Lipase 94 on 2/5. WBC 6.9. H/H stable. PICC line placed 2/6 for TPN. Biopsy of the left supraclavicular lymph node pending.  -continue TPN, lab management per pharmacy - further management per Dr. Benay Spice   2) Epigastric abdominal pain due to # 1, constipation possible contributing factor -discussed transitioning from IV to PO Dilaudid prior to discharge home -KUB to assess for ileus, constipation  -Dulcolax suppository as needed -continue Pantoprazole 52m IV for now  3) Nausea -Zofran as needed  4) Puritic Rash: etiology unknown -Benadryl as needed -followed by Dr. GBonner Puna    LOS: 9 days   CNoralyn Pick 06/25/2018, 12:23 PM    [facility] Attending   I have taken an interval history, reviewed the chart and examined the patient. I agree with the Advanced Practitioner's note, impression and recommendations.   Difficult situation with metastatic small bowel cancer, post intervention pancreatitis and pain control problems.  Suspect pain is from pancreatitis and cancer - will check AXR to see if any bowel dilation or findings that would lead to different treatment.  Dr. AHavery Moroswill f/u again tomorrow - he is primary GI hospitalist for [facility] this week.  CGatha Mayer MD, [facility] 06/25/2018 1:14 PM Pager [phone]

## 2018-06-25 NOTE — Plan of Care (Signed)
Pt is on a Clear Liquid diet now. Not tolerating very well. Enjoyed hot tea w/ no sweetener. Pt did have liquid stool on dayshift.. Pt needs pain meds Q2 in order for her pain to be tolerable. Notified on call for something other than Zofran for nausea relief. Phenergan working better.

## 2018-06-25 NOTE — Progress Notes (Signed)
PHARMACY - ADULT TOTAL PARENTERAL NUTRITION CONSULT NOTE   Pharmacy Consult for TPN Indication: Severe pancreatitis  Patient Measurements: Height: 5' 11"  (180.3 cm) Weight: 228 lb 12.8 oz (103.8 kg) IBW/kg (Calculated) : 70.8   Body mass index is 31.91 kg/m. Usual Weight: Most recent weight 105 kg on 2/2 Current weight: 103 kg  Insulin Requirements: 1 unit in the past 24 hrs; on sensitive SSI q6h - no hx DM  Current Nutrition: NPO  IVF: D5 1/2NS at 40 mL/hr   Central access: Double lumen PICC placed 2/6 TPN start date: 2/7  ASSESSMENT                                                                                              HPI: Pt has PMH significant for 1 month of nausea, decreased appetite, abdominal pain, and weight loss. Underwent ERCP and biliary stent placement on 1/30 with finding of a large ulcerated duodenal mass. Pt presented on 2/1 with post-ERCP pancreatitis. Pt has been unable to tolerate PO intake during admission and has had minimal PO intake for several weeks. Pharmacy consulted to dose TPN.  Significant events:  -1/30: ERCP with biliary stent placement -2/6: PICC placed -2/7: Initiate TPN  Today:   Glucose (goal <150): wnl  Electrolytes: all lytes wnl  Renal: SCr stable, WNL  LFTs: AST down to wnl, ALT slightly elevated but trending down to 63, Alk Phos stable at 128: Tbili down 1.3  TGs: wnl but trending up to 141 -- will monitor closely  Prealbumin: 7.2 (2/7), 8.4 (2/10)  NUTRITIONAL GOALS                                                                                 RD recs per note on 2/7: Kcal: 2200 - 2400 kcal Protein: 110 - 120 g Fluid: >/= 2.2 L/day  TPN at goal rate of 92 mL/hr will provide: 2330 kcal, 117 g protein, and 353 g dextrose, meeting 100% of patient needs  PLAN                                                                                                               At 1800 today:  Increase TPN to 80 mL/hr, slow  advancement - per discussion with RD, pt has had minimal PO intake for several weeks and is at  risk for refeeding syndrome.   Plan to advance as tolerated to the goal rate.  TPN to contain standard multivitamins and trace elements.  TPN to contain standard concentration of electrolytes except:  Decrease potassium down to standard conc of 59mq/L  Add phosphorus back at 10 mmol/L  Reduce IVF to 20 mL/hr.   Continue sensitive SSI q6h  TPN lab panels on Mondays & Thursdays.  F/u daily.  ADia Sitter PharmD, BCPS 06/25/2018 7:17 AM

## 2018-06-25 NOTE — Progress Notes (Addendum)
Nutrition Follow-up  DOCUMENTATION CODES:   Non-severe (moderate) malnutrition in context of acute illness/injury, Obesity unspecified  INTERVENTION:  - Continue to monitor TPN; advance per Pharmacy - Continue monitoring Mg, K, and Phos as pt is at risk for refeeding. - Monitor PO intake  NUTRITION DIAGNOSIS:   Moderate Malnutrition related to acute illness, cancer and cancer related treatments as evidenced by percent weight loss, energy intake < or equal to 50% for > or equal to 5 days.  Ongoing  GOAL:   Patient will meet greater than or equal to 90% of their needs  Progressing  MONITOR:   PO intake, Diet advancement, Weight trends, Labs, Other (Comment)(TPN regimen)  ASSESSMENT:   47 y.o. female with medical history significant of ADHD who presented 2/1 with post-ERCP pancreatitis. ERCP was on 1/30 in investigation of 1 month of nausea, decreased appetite, abdominal pain and weight loss with elevated LFTs and pancreatic head mass with hepatic ductal dilatation. There was some concern for metastatic spread with pulmonary nodules and porta hepatis gastrohepatic area and retroperitoneal lymphadenopathy. ERCP found severely abnormal duodenum causing a distal CBD stricture. Biopsies were obtained, and biliary stent placed.  Pt had biopsy of left supraclavicular LN on 02/07; will meet with outpatient oncologist on 02/13. Likely plan to start FOLFOX  02/07: Began TPN @ 69ml/hr 02/08: Pt adv to clear liquid 02/10: TPN @ 56ml/hr, adv to 57ml/hr  Spoke w/ pt at bedside. Pt says she has been feeling poorly here lately. Very limited PO intake; only had a few sips of juice and broth yesterday (02/09) and with that experienced abdominal pain and nausea. Pt reported that anything PO is not being tolerated well, even ice chips. Pt has not ordered anything for today. No issues with energy levels or mobility.   Spoke with Pharmacist about plans for advancing TPN; will adv to 55ml/hr today  (02/10) and plan to adv to goal rate tomorrow (02/11). Plan to add phosphorus back to TPN. Per pharmacy note: TPN goal rate of 41ml/hr to provide 100% of pt needs (2330 kcal, 117g protein)  Current goal is for pt to discharge on TPN. Will need to monitor and educate if needed.  Medications reviewed and include: Dex NaCl 36ml/hr, TPN 64ml/hr, insulin aspart 0-9 units q 6hr, Dulcolax 10mg  once Labs reviewed: Na 137 (WNL), K 4.0 (WNL), Phos 4.2 (WNL), Mg 2.1 (WNL), CBG (113, 119), Alkaline Phos 128 (H), ALT 63 (H), AST 35 (WNL), Triglycerides 141 (WNL)  Diet Order:   Diet Order            Diet clear liquid Room service appropriate? Yes; Fluid consistency: Thin  Diet effective ____              EDUCATION NEEDS:   No education needs have been identified at this time  Skin:  Skin Assessment: Reviewed RN Assessment  Last BM:  02/09(very small, mucus like)  Height:   Ht Readings from Last 1 Encounters:  06/16/18 5\' 11"  (1.803 m)    Weight:   Wt Readings from Last 1 Encounters:  06/24/18 103.8 kg    Ideal Body Weight:  70.45 kg  BMI:  Body mass index is 31.91 kg/m.  Estimated Nutritional Needs:   Kcal:  2200-2400 kcal  Protein:  110-120 grams  Fluid:  >/= 2.2 L/day    [employer] Dietetic Intern

## 2018-06-26 ENCOUNTER — Other Ambulatory Visit: Payer: Self-pay | Admitting: *Deleted

## 2018-06-26 DIAGNOSIS — Z7189 Other specified counseling: Secondary | ICD-10-CM

## 2018-06-26 DIAGNOSIS — C170 Malignant neoplasm of duodenum: Secondary | ICD-10-CM

## 2018-06-26 LAB — BASIC METABOLIC PANEL
Anion gap: 8 (ref 5–15)
BUN: 25 mg/dL — ABNORMAL HIGH (ref 6–20)
CALCIUM: 9.2 mg/dL (ref 8.9–10.3)
CO2: 26 mmol/L (ref 22–32)
Chloride: 100 mmol/L (ref 98–111)
Creatinine, Ser: 0.62 mg/dL (ref 0.44–1.00)
GFR calc Af Amer: >60 mL/min (ref >60)
GFR calc non Af Amer: >60 mL/min (ref >60)
Glucose, Bld: 120 mg/dL — ABNORMAL HIGH (ref 70–99)
Potassium: 4.1 mmol/L (ref 3.5–5.1)
SODIUM: 134 mmol/L — AB (ref 135–145)

## 2018-06-26 LAB — GLUCOSE, CAPILLARY
Glucose-Capillary: 120 mg/dL — ABNORMAL HIGH (ref 70–99)
Glucose-Capillary: 121 mg/dL — ABNORMAL HIGH (ref 70–99)
Glucose-Capillary: 126 mg/dL — ABNORMAL HIGH (ref 70–99)
Glucose-Capillary: 142 mg/dL — ABNORMAL HIGH (ref 70–99)

## 2018-06-26 LAB — MAGNESIUM: Magnesium: 2.1 mg/dL (ref 1.7–2.4)

## 2018-06-26 LAB — PHOSPHORUS: Phosphorus: 4.7 mg/dL — ABNORMAL HIGH (ref 2.5–4.6)

## 2018-06-26 MED ORDER — SUCRALFATE 1 GM/10ML PO SUSP
1.0000 g | Freq: Four times a day (QID) | ORAL | Status: DC | PRN
  Administered 2018-06-26 – 2018-06-28 (×3): 1 g via ORAL
  Filled 2018-06-26 (×3): qty 10

## 2018-06-26 MED ORDER — TRAVASOL 10 % IV SOLN
INTRAVENOUS | Status: AC
  Administered 2018-06-26: 17:00:00 via INTRAVENOUS
  Filled 2018-06-26: qty 1017.6

## 2018-06-26 NOTE — Progress Notes (Signed)
PHARMACY - ADULT TOTAL PARENTERAL NUTRITION CONSULT NOTE   Pharmacy Consult for TPN Indication: Severe pancreatitis  Patient Measurements: Height: 5' 11"  (180.3 cm) Weight: 228 lb 12.8 oz (103.8 kg) IBW/kg (Calculated) : 70.8   Body mass index is 31.91 kg/m. Usual Weight: Most recent weight 105 kg on 2/2 Current weight: 103 kg  Insulin Requirements: 2 units from sensitive SSI q6h since TPN rate increased yesterday - no hx DM  Current Nutrition: NPO  IVF: D5 1/2NS at 20 mL/hr   Central access: Double lumen PICC placed 2/6 TPN start date: 2/7  ASSESSMENT                                                                                              HPI: Pt has PMH significant for 1 month of nausea, decreased appetite, abdominal pain, and weight loss. Underwent ERCP and biliary stent placement on 1/30 with finding of a large ulcerated duodenal mass. Pt presented on 2/1 with post-ERCP pancreatitis. Pt has been unable to tolerate PO intake during admission and has had minimal PO intake for several weeks. Pharmacy consulted to dose TPN.  Significant events:  -1/30: ERCP with biliary stent placement -2/6: PICC placed -2/7: Initiate TPN - 2/10: clear liquid diet   Today:   Glucose (goal <150): wnl  Electrolytes: Na slightly low at 134; phos slightly elevated at 4.7 with phos added back to TPN; other labs wnl  Renal: SCr stable, WNL  LFTs: AST down to wnl, ALT slightly elevated but trending down to 63, Alk Phos stable at 128: Tbili down 1.3  TGs: wnl but trending up to 141 -- will monitor closely  Prealbumin: 7.2 (2/7), 8.4 (2/10)  NUTRITIONAL GOALS                                                                                 RD recs per note on 2/7: Kcal: 2200 - 2400 kcal Protein: 110 - 120 g Fluid: >/= 2.2 L/day  TPN at goal rate of 92 mL/hr will provide: 2330 kcal, 117 g protein, and 353 g dextrose, meeting 100% of patient needs  PLAN                                                                                                                At 1800 today:  Per Dr.  Grunz (on 2/11), plan is to adv diet and, if tolerates, will titrate off TPN. Will continue TPN at 80 mL/hr for now and f/u on pt's oral intake.  Plan to advance as tolerated to the goal rate.  TPN to contain standard multivitamins and trace elements.  TPN to contain standard concentration of electrolytes except:  remove phosphorus from TPN  Continue IVF at 20 ml/hr.   Continue sensitive SSI q6h  TPN lab panels on Mondays & Thursdays.  F/u daily.   Dia Sitter, PharmD, BCPS 06/26/2018 7:03 AM

## 2018-06-26 NOTE — Progress Notes (Signed)
IP PROGRESS NOTE  Subjective:   Hailey Houston reports improvement in the abdominal pain this morning.  She continues to take frequent Dilaudid for breakthrough pain.  She has a pruritic rash.  She reports the rash predated the ERCP procedure. Objective: Vital signs in last 24 hours: Blood pressure 126/84, pulse 91, temperature 98.1 F (36.7 C), temperature source Oral, resp. rate 18, height 5\' 11"  (1.803 m), weight 226 lb 8 oz (102.7 kg), SpO2 98 %.  Intake/Output from previous day: 02/10 0701 - 02/11 0700 In: 2581 [P.O.:320; I.V.:2261] Out: 1100 [Urine:1100]  Physical Exam:   Abdomen: Soft, nontender, no mass, no hepatomegaly Skin: Erythematous rash over the trunk    Lab Results: Recent Labs    06/25/18 0338  WBC 6.9  HGB 10.1*  HCT 32.1*  PLT 481*    BMET Recent Labs    06/24/18 0323 06/25/18 0338  NA 138 137  K 3.8 4.0  CL 104 103  CO2 25 25  GLUCOSE 115* 113*  BUN 8 12  CREATININE 0.53 0.61  CALCIUM 8.9 9.1    No results found for: CEA1  Studies/Results: Dg Abd 2 Views  Result Date: 06/25/2018 CLINICAL DATA:  Epigastric abdominal pain. EXAM: ABDOMEN - 2 VIEW COMPARISON:  CT 66/[id number] FINDINGS: Metallic biliary stent is noted in place. There is pneumobilia. Nonobstructive bowel gas pattern. Moderate stool in the colon. No free air. IUD noted in the pelvis. Multiple calcified phleboliths in the pelvis. IMPRESSION: No evidence of bowel obstruction or free air. Biliary stent and pneumobilia noted. Electronically Signed   By: Rolm Baptise M.D.   On: 06/25/2018 19:25    Medications: I have reviewed the patient's current medications.  Assessment/Plan: 1.  Adenocarcinoma of the duodenum  CT abdomen/pelvis 06/16/2018- changes of pancreatitis, fullness at the head of the pancreas without a defined mass, multiple subcentimeter peripancreatic lymph nodes, distal common bile duct stent, multiple nodules at the lung bases  EUS 06/14/2018-ulcerated mucosa/mass at the  distal duodenal bulb with extension to the pancreas head, portal adenopathy, biopsy of duodenal mass-adenocarcinoma  ERCP 06/14/2018- distal duodenal bulb mass, 1 cm proximal to the ampulla causing biliary obstruction, status post placement of a metal common bile duct stent  CT chest 06/20/2018- bilateral pulmonary nodules consistent with metastatic disease, supraclavicular, mediastinal, and hilar lymphadenopathy, changes of pancreatitis  Ultrasound-guided biopsy of a left supraclavicular lymph node 06/22/2018  2.  Biliary obstruction secondary to the duodenal mass, status post placement of a bile duct stent 06/14/2018 3.  Post ERCP pancreatitis 06/16/2018 4.  Anemia secondary to phlebotomy and multiple procedures 5.  G2, P2 6.  Nausea secondary to #1 7.  Attention deficit disorder 8.  Abdominal pain- secondary to the small bowel carcinoma versus pancreatitis  9.  Rash   Hailey Houston has noted improvement in the abdominal pain since yesterday.  Hopefully the Duragesic patch is helping and she will require less frequent medication for breakthrough pain.  We discussed the diagnosis and treatment plan again today.  I recommend FOLFOX chemotherapy to begin as an outpatient.  She will be scheduled to begin systemic therapy on 07/02/2018.  I discussed the patient with the medical service.  Her diet will be advanced as tolerated and the TNA weaned to off.       Recommendations: 1.  Advance diet, wean TNA as tolerated 2.  Continue Duragesic and Dilaudid for pain, consider changing to a different breakthrough narcotic secondary to the rash 3.  Outpatient follow-up for an office visit  and chemotherapy 07/02/2018 4.  Keep PICC in place for chemotherapy  LOS: 10 days   Betsy Coder, MD   06/26/2018, 7:28 AM Addendum: The biopsy from the left supraclavicular node revealed metastatic adenocarcinoma.  I discussed the biopsy result with Hailey Houston.  She has been diagnosed with metastatic small bowel  carcinoma.  She understands no therapy will be curative.  We discussed the expected prognosis and treatment options.  We are waiting on molecular testing the duodenal biopsy.  I recommend beginning FOLFOX chemotherapy within the next few days if she remains in the hospital or as an outpatient next week. I reviewed potential toxicities associated with the FOLFOX regimen including the chance for nausea/vomiting, mucositis, diarrhea, alopecia, and hematologic toxicity.  We discussed the sun sensitivity, rash, hyperpigmentation, and hand/foot syndrome associated with 5-fluorouracil.  We reviewed the allergic reaction and various types of neuropathy seen with oxaliplatin.  She agrees to proceed.  She reports not obtaining pain relief from the 6 mg Dilaudid dose.  The Duragesic patch has been started.  I will increase the Dilaudid dose to 8 mg as needed.

## 2018-06-26 NOTE — Progress Notes (Signed)
PROGRESS NOTE  CHARITA LINDENBERGER  OFB:[id number] DOB: [birth year] DOA: 06/16/2018 PCP: Patient, No Pcp Per   Brief Narrative: 48 y.o.femalewith medical history significant of ADHD who presented 2/1 with post-ERCP pancreatitis. ERCP was on 1/30 in investigation of 1 month of nausea, decreased appetite, abdominal pain and weight loss with elevated LFTs and pancreatic head mass with intr- and extra-hepatic ductal dilatation. There was some concern for metastatic spread with pulmonary nodules and porta hepatis gastrohepatic area and retroperitoneal lymphadenopathy. ERCP found severely abnormal duodenum causing a distal CBD stricture she had a limited biliary sphincterectomy and balloon dilatation. Biopsies were obtained, and biliary stent placed. Since admission diet has been restricted and IVF's given with GI consulted.   Assessment & Plan: Active Problems:   Adenocarcinoma of duodenum (HCC)   Pancreatitis   Hypokalemia   Dehydration   Nausea and vomiting   Acute pancreatitis   Pulmonary nodules   Malnutrition of moderate degree   Epigastric pain  Metastatic adenocarcinoma of duodenum: to lungs and LN-proven spread.  - Oncology, Dr. Benay Spice consulted. Planning to initiate FOLFOX soon.  - Suspect some pain is from malignancy, though it is unclear. With acute worsening of pain, suspect most of the pain is from pancreatitis and therefore ramping up longacting pain medications may not be the best longterm solution, though will defer to oncology's ongoing assessment after discharge.   Post-ERCP pancreatitis: With limited contrast enhancement on CT 2/5, consistent with forming necrosis. Severe.  - Pain control: Suspect this will be a moving target as pain is possibly primarily due to pancreatitis given its acuity.   - Continue fentanyl patch 28mcg started 2/10 AM, may increase dose if needed.  - Continue dilaudid 8mg  po prn   - Continue to have IV backup  - Inserted PICC and started TPN due to  anticipated protracted recovery and need for optimization with chemotherapy forthcoming. Advancing diet today, hopeful for success and possibly stopping TPN. - Continue supportive care with IVF, analgesics, etc.   Constipation: Moderate stool in colon confirmed on abd XR 2/10, on my personal review there is also no definitely obstructive bowel gas pattern. - Suppositories daily - May need tap water enema. Defer to GI.  Hypokalemia: Improved.  - Supplement as needed  Dehydration:  - Continue IVF, tapering down with TPN up.  Moderate protein-calorie malnutrition:  - Supplement with protein through TPN currently.   Rash: Unclear etiology, though may be worsened by anxiety. ?Paraneoplastic vs. contact irritant/allergic dermatitis. Clinically appears very improved. No evidence of Leser-Trelat w/SebK's or bullous disease.  - Improving with topical steroid, po antihistamine which we will continue.   DVT prophylaxis: Lovenox  Code Status: Full Family Communication: None at bedside Disposition Plan: Home once able to control pain on oral medications. If able to advance diet, can DC without TPN. Discussed with Dr. Benay Spice; both he and the patient are eager to begin chemotherapy as soon as reasonably possible.   Consultants:   GI  Oncology  Procedures:   None  Antimicrobials:  None   Subjective: Made some headway over past 24 hours. Slept better, tolerating water and apple juice today. Abdominal pain is epigastric with multidirectional radiation, at 2-2.5/10 and worsening to 10/10 intermittently but improved with dilaudid po with IV as well. No BM yet. , worst in epigastrium but sparks of worsening radiation bilaterally. Flatus only with suppository and worsened abd pain.   Objective: Vitals:   06/25/18 1652 06/26/18 0033 06/26/18 0642 06/26/18 0822  BP: 139/77 139/73 126/84 Marland Kitchen)  148/84  Pulse: (!) 102 92 91 85  Resp: 14 14 18 18   Temp: 98.7 F (37.1 C) 98.3 F (36.8 C) 98.1 F  (36.7 C) 98.1 F (36.7 C)  TempSrc: Oral Oral Oral Oral  SpO2: 95% 100% 98% 99%  Weight:   102.7 kg   Height:        Intake/Output Summary (Last 24 hours) at 06/26/2018 1254 Last data filed at 06/26/2018 1100 Gross per 24 hour  Intake 2660.99 ml  Output 1100 ml  Net 1560.99 ml   Filed Weights   06/22/18 0435 06/24/18 0643 06/26/18 0642  Weight: 102.9 kg 103.8 kg 102.7 kg   Gen: 48 y.o. female in no distress Pulm: Nonlabored breathing room air. No wheezes heard. CV: Regular rate and rhythm. No JVD, no dependent edema. GI: Abdomen soft, tender, stable, non-distended, +BS.  Ext: Warm, no deformities Skin: No new rashes, lesions or ulcers on visualized skin. Pruritic eruption is stable. Neuro: Alert and oriented. No focal neurological deficits. Psych: Judgement and insight appear fair. Mood euthymic & affect congruent. Behavior is appropriate.    Data Reviewed: I have personally reviewed following labs and imaging studies  CBC: Recent Labs  Lab 06/20/18 0558 06/21/18 0523 06/22/18 0423 06/25/18 0338  WBC 4.6 4.5 4.9 6.9  NEUTROABS  --   --  2.8 4.1  HGB 9.0* 9.3* 9.3* 10.1*  HCT 27.8* 28.8* 29.0* 32.1*  MCV 98.6 97.6 98.6 99.1  PLT 344 395 438* 093*   Basic Metabolic Panel: Recent Labs  Lab 06/22/18 0423 06/23/18 0353 06/24/18 0323 06/25/18 0338 06/26/18 0901  NA 136 137 138 137 134*  K 3.4* 3.6 3.8 4.0 4.1  CL 104 104 104 103 100  CO2 25 26 25 25 26   GLUCOSE 124* 135* 115* 113* 120*  BUN <5* <5* 8 12 25*  CREATININE 0.50 0.57 0.53 0.61 0.62  CALCIUM 8.3* 8.6* 8.9 9.1 9.2  MG 2.2 2.1 2.0 2.1 2.1  PHOS 4.2 4.9* 4.8* 4.2 4.7*   GFR: Estimated Creatinine Clearance: 117.2 mL/min (by C-G formula based on SCr of 0.62 mg/dL). Liver Function Tests: Recent Labs  Lab 06/20/18 0558 06/21/18 0523 06/22/18 0423 06/24/18 0323 06/25/18 0338  AST 43* 49* 57* 41 35  ALT 112* 101* 95* 72* 63*  ALKPHOS 155* 149* 142* 127* 128*  BILITOT 1.6* 1.8* 1.8* 1.4* 1.3*    PROT 6.2* 6.5 6.2* 6.4* 7.1  ALBUMIN 3.0* 3.2* 3.2* 3.3* 3.5   Recent Labs  Lab 06/20/18 0558  LIPASE 94*   Coagulation Profile: No results for input(s): INR, PROTIME in the last 168 hours. CBG: Recent Labs  Lab 06/25/18 1143 06/25/18 1808 06/26/18 0035 06/26/18 0640 06/26/18 1218  GLUCAP 122* 121* 142* 120* 121*   Lipid Profile: Recent Labs    06/25/18 0338  TRIG 141   Radiology Studies: Dg Abd 2 Views  Result Date: 06/25/2018 CLINICAL DATA:  Epigastric abdominal pain. EXAM: ABDOMEN - 2 VIEW COMPARISON:  CT 81/82/9937 FINDINGS: Metallic biliary stent is noted in place. There is pneumobilia. Nonobstructive bowel gas pattern. Moderate stool in the colon. No free air. IUD noted in the pelvis. Multiple calcified phleboliths in the pelvis. IMPRESSION: No evidence of bowel obstruction or free air. Biliary stent and pneumobilia noted. Electronically Signed   By: Rolm Baptise M.D.   On: 06/25/2018 19:25    Scheduled Meds: . bisacodyl  10 mg Rectal Once  . clobetasol cream   Topical BID  . enoxaparin (LOVENOX) injection  40 mg  Subcutaneous Q24H  . fentaNYL  1 patch Transdermal Q72H  . insulin aspart  0-9 Units Subcutaneous Q6H  . pantoprazole (PROTONIX) IV  40 mg Intravenous Q24H  . triamcinolone cream   Topical TID   Continuous Infusions: . dextrose 5 % and 0.45% NaCl 20 mL/hr at 06/25/18 1805  . TPN ADULT (ION) 80 mL/hr at 06/25/18 1739  . TPN ADULT (ION)       LOS: 10 days   Time spent: 35 minutes.  Patrecia Pour, MD [facility] [email or web address] Password [facility] 06/26/2018, 12:54 PM

## 2018-06-26 NOTE — Progress Notes (Signed)
Per Dr. Benay Spice: Probable d/c from hospital 2/12. Wants to begin 1st FOLFOX on 2/17 with OV, then 07/16/18 with OV.  High priority scheduling message sent as requested so appointments will be on discharge paperwork. Also ordered chemo class for 2/13 or 2/14 and will need PICC flush on 2/24 and 2/27.  Email to managed care to work on authorization.

## 2018-06-26 NOTE — Progress Notes (Signed)
Pt still hasn't had results from earlier suppository.  Prefers to wait on having the enema until tomorrow. Will continue to monitor.

## 2018-06-26 NOTE — Progress Notes (Signed)
     [facility] Progress Note  CC:  Metastatic duodenal adenocarcinoma with metastasis  Subjective: had a good night last night, slept well,  increasing generalized abdominal pain this morning, concerned her pain will become severe as she waits for the nurse to bring her pain medication, passed gas after Dulcolax suppository yesterday, no BM.    Objective:  Vital signs in last 24 hours: Temp:  [98.1 F (36.7 C)-98.7 F (37.1 C)] 98.1 F (36.7 C) (02/11 0642) Pulse Rate:  [91-102] 91 (02/11 0642) Resp:  [14-18] 18 (02/11 0642) BP: (126-139)/(73-84) 126/84 (02/11 0642) SpO2:  [95 %-100 %] 98 % (02/11 0642) Weight:  [102.7 kg] 102.7 kg (02/11 0642) Last BM Date: 06/24/18(per pt very small, mucus like) General:  Alert, well developed in NAD  Heart: RRR, no murmurs Pulm: clear throughout Abdomen:  Soft, epigastric tenderness, mild lower abdominal tenderness Extremities: no edema Neurologic:  Alert and oriented x 3 Psych:  Alert and cooperative. Normal mood and affect.  Intake/Output from previous day: 02/10 0701 - 02/11 0700 In: 2581 [P.O.:320; I.V.:2261] Out: 1100 [Urine:1100] Intake/Output this shift: No intake/output data recorded.  Lab Results: Recent Labs    06/25/18 0338  WBC 6.9  HGB 10.1*  HCT 32.1*  PLT 481*   BMET Recent Labs    06/24/18 0323 06/25/18 0338  NA 138 137  K 3.8 4.0  CL 104 103  CO2 25 25  GLUCOSE 115* 113*  BUN 8 12  CREATININE 0.53 0.61  CALCIUM 8.9 9.1   LFT Recent Labs    06/25/18 0338  PROT 7.1  ALBUMIN 3.5  AST 35  ALT 63*  ALKPHOS 128*  BILITOT 1.3*  Dg Abd 2 Views  Result Date: 06/25/2018 CLINICAL DATA:  Epigastric abdominal pain. EXAM: ABDOMEN - 2 VIEW COMPARISON:  CT 41/32/4401 FINDINGS: Metallic biliary stent is noted in place. There is pneumobilia. Nonobstructive bowel gas pattern. Moderate stool in the colon. No free air. IUD noted in the pelvis. Multiple calcified phleboliths in the pelvis.  IMPRESSION: No evidence of bowel obstruction or free air. Biliary stent and pneumobilia noted. Electronically Signed   By: Rolm Baptise M.D.   On: 06/25/2018 19:25    Assessment / Plan: 60) 48 year old female with duodenal adenocarcinoma with metastasis resulting in GOO and biliary obstruction. S/P ERCP 06/14/2018 with stent placement to the CBD complicated by post ERCP pancreatitis 06/16/2018. LFTs and Lipase levels trending downward. Biopsy of a left supraclavicular lymph node 06/22/2018 confirmed metastatic adenocarcinoma.  -followed by Dr. Benay Spice, planning to start Folfox as in patient   2) Epigastric pain secondary to # 1 -on Dilaudid po/IV and Fentanyl patch managed by Dr. Bonner Puna, discussed pain management plan with Dr. Bonner Puna and patient's nurse. -continue Protonix 40mg  IV   3) Nausea is well controlled with Zofran  4) Malnutrition: She is unable to tolerate clear liquids. PICC line placed for TPN on 06/21/2018. Admission weight 105.3 kg down to 102.7kg.  -TPN labs followed by pharmacy  5) Constipation due to lack of po intake. Abdominal xray 2/20 confirmed stool in the colon without obstruction. -Dulcolax suppository today -Water enema as needed  5) Pruritic rash unknown etiology -Benadryl as needed -consider dermatology consult if rash and pruritis worsen  LOS: 10 days   Noralyn Pick  06/26/2018, 7:25 AM

## 2018-06-26 NOTE — Progress Notes (Signed)
  Oncology Nurse Navigator Documentation  Navigator Location: [facility] (06/26/18 1212)                         Patient Visit Type: Inpatient (06/26/18 1212)   Barriers/Navigation Needs: Education (06/26/18 1212) Education: Understanding Cancer/ Treatment Options;Coping with Diagnosis/ Prognosis;Newly Diagnosed Cancer Education;Preparing for Upcoming Surgery/ Treatment (06/26/18 1212) Interventions: Education;Psycho-social support (06/26/18 1212)            Acuity: Level 3 (06/26/18 1212)     Acuity Level 3: Emotional needs;Ongoing guidance and education provided throughout treatment (06/26/18 1212)   Time Spent with Patient: 60 (06/26/18 1212)

## 2018-06-27 ENCOUNTER — Inpatient Hospital Stay (HOSPITAL_COMMUNITY): Payer: 59

## 2018-06-27 ENCOUNTER — Encounter: Payer: Self-pay | Admitting: General Practice

## 2018-06-27 ENCOUNTER — Other Ambulatory Visit: Payer: 59

## 2018-06-27 DIAGNOSIS — K8500 Idiopathic acute pancreatitis without necrosis or infection: Secondary | ICD-10-CM

## 2018-06-27 DIAGNOSIS — E440 Moderate protein-calorie malnutrition: Secondary | ICD-10-CM

## 2018-06-27 LAB — BASIC METABOLIC PANEL
Anion gap: 8 (ref 5–15)
BUN: 20 mg/dL (ref 6–20)
CO2: 26 mmol/L (ref 22–32)
CREATININE: 0.57 mg/dL (ref 0.44–1.00)
Calcium: 9 mg/dL (ref 8.9–10.3)
Chloride: 102 mmol/L (ref 98–111)
GFR calc Af Amer: >60 mL/min (ref >60)
GFR calc non Af Amer: >60 mL/min (ref >60)
Glucose, Bld: 124 mg/dL — ABNORMAL HIGH (ref 70–99)
Potassium: 4.7 mmol/L (ref 3.5–5.1)
SODIUM: 136 mmol/L (ref 135–145)

## 2018-06-27 LAB — GLUCOSE, CAPILLARY
Glucose-Capillary: 103 mg/dL — ABNORMAL HIGH (ref 70–99)
Glucose-Capillary: 110 mg/dL — ABNORMAL HIGH (ref 70–99)
Glucose-Capillary: 117 mg/dL — ABNORMAL HIGH (ref 70–99)
Glucose-Capillary: 142 mg/dL — ABNORMAL HIGH (ref 70–99)

## 2018-06-27 LAB — PHOSPHORUS: Phosphorus: 4.7 mg/dL — ABNORMAL HIGH (ref 2.5–4.6)

## 2018-06-27 LAB — MAGNESIUM: Magnesium: 2.2 mg/dL (ref 1.7–2.4)

## 2018-06-27 MED ORDER — FENTANYL 75 MCG/HR TD PT72
1.0000 | MEDICATED_PATCH | TRANSDERMAL | Status: DC
  Administered 2018-06-27: 1 via TRANSDERMAL
  Filled 2018-06-27: qty 1

## 2018-06-27 MED ORDER — PANTOPRAZOLE SODIUM 40 MG PO PACK
40.0000 mg | PACK | Freq: Every day | ORAL | Status: DC
  Filled 2018-06-27: qty 20

## 2018-06-27 MED ORDER — PANTOPRAZOLE SODIUM 40 MG IV SOLR
40.0000 mg | Freq: Two times a day (BID) | INTRAVENOUS | Status: DC
  Administered 2018-06-27 – 2018-06-29 (×4): 40 mg via INTRAVENOUS
  Filled 2018-06-27 (×4): qty 40

## 2018-06-27 MED ORDER — HYDROXYZINE HCL 25 MG PO TABS: 25.0000 mg | ORAL_TABLET | Freq: Three times a day (TID) | ORAL | Status: DC | PRN

## 2018-06-27 MED ORDER — HYDROXYZINE HCL 25 MG PO TABS
50.0000 mg | ORAL_TABLET | Freq: Three times a day (TID) | ORAL | Status: DC | PRN
  Administered 2018-06-27 – 2018-06-30 (×8): 50 mg via ORAL
  Filled 2018-06-27 (×9): qty 2

## 2018-06-27 MED ORDER — PANTOPRAZOLE SODIUM 40 MG PO PACK
40.0000 mg | PACK | Freq: Two times a day (BID) | ORAL | Status: DC
  Administered 2018-06-27: 40 mg
  Filled 2018-06-27 (×2): qty 20

## 2018-06-27 MED ORDER — TRAVASOL 10 % IV SOLN
INTRAVENOUS | Status: AC
  Administered 2018-06-27: 18:00:00 via INTRAVENOUS
  Filled 2018-06-27: qty 1170.24

## 2018-06-27 MED ORDER — FAMOTIDINE 20 MG PO TABS
20.0000 mg | ORAL_TABLET | Freq: Two times a day (BID) | ORAL | Status: DC
  Administered 2018-06-27 – 2018-06-30 (×7): 20 mg via ORAL
  Filled 2018-06-27 (×7): qty 1

## 2018-06-27 NOTE — Progress Notes (Addendum)
[facility] Progress Note  CC:  Metastatic duodenal adenocarcinoma with metastasis  Subjective: Slept fairly well last night. Heartburn improved after taking Carafate. Continues to have epigastric pain, sometimes pain is central abdomen. Pain is controlled with alternating oral and IV Dilaudid with Fentanyl patch. Frequent nausea. No vomiting. Passing gas per the rectum. No BM.   Objective:  Vital signs in last 24 hours: Temp:  [98 F (36.7 C)-98.6 F (37 C)] 98 F (36.7 C) (02/12 0509) Pulse Rate:  [75-92] 75 (02/12 0509) Resp:  [12-16] 16 (02/11 2030) BP: (119-154)/(55-85) 119/55 (02/12 0509) SpO2:  [98 %-100 %] 98 % (02/12 0509) Weight:  [102.9 kg] 102.9 kg (02/12 0509) Last BM Date: 06/24/18 General:   Alert,  Well-developed,    in NAD Heart:  Regular rate and rhythm; no murmurs Pulm: clear throughout. Abdomen:  Soft, nondistended,  tenderness to the epigastric area and throughout the lower abdomen without rebound or guarding. + BS x 4 quads. Ecchymotic areas to the lower abdomen.  Extremities:  Without edema. Neurologic:  Alert and  oriented x4;  grossly normal neurologically. Psych:  Alert and cooperative. Normal mood and affect.  Intake/Output from previous day: 02/11 0701 - 02/12 0700 In: 3234.5 [P.O.:850; I.V.:2384.5] Out: 1600 [Urine:1600] Intake/Output this shift: No intake/output data recorded.  Lab Results: Recent Labs    06/25/18 0338  WBC 6.9  HGB 10.1*  HCT 32.1*  PLT 481*   BMET Recent Labs    06/25/18 0338 06/26/18 0901 06/27/18 0405  NA 137 134* 136  K 4.0 4.1 4.7  CL 103 100 102  CO2 25 26 26   GLUCOSE 113* 120* 124*  BUN 12 25* 20  CREATININE 0.61 0.62 0.57  CALCIUM 9.1 9.2 9.0   LFT Recent Labs    06/25/18 0338  PROT 7.1  ALBUMIN 3.5  AST 35  ALT 63*  ALKPHOS 128*  BILITOT 1.3*   Dg Abd 2 Views  Result Date: 06/25/2018 CLINICAL DATA:  Epigastric abdominal pain. EXAM: ABDOMEN - 2 VIEW COMPARISON:  CT  83/41/9622 FINDINGS: Metallic biliary stent is noted in place. There is pneumobilia. Nonobstructive bowel gas pattern. Moderate stool in the colon. No free air. IUD noted in the pelvis. Multiple calcified phleboliths in the pelvis. IMPRESSION: No evidence of bowel obstruction or free air. Biliary stent and pneumobilia noted. Electronically Signed   By: Rolm Baptise M.D.   On: 06/25/2018 19:25    Assessment / Plan: 69) 48 year old female with duodenal adenocarcinoma with metastasis resulting in GOO and biliary obstruction. S/P ERCP 06/14/2018 with stent placement to the CBD complicated by post ERCP pancreatitis 06/16/2018. LFTs and Lipase levels trending downward. Biopsy of a left supraclavicular lymph node 06/22/2018 confirmed metastatic adenocarcinoma.  -followed by Dr. Benay Spice, planning to start Folfox as in patient   2) Epigastric pain secondary to # 1 -Upper GI series/barium study ordered to assess gastric emptying and degree of duodenal stenosis, discussed with patient, she consents  -on Dilaudid po/IV and Fentanyl patch managed by hospitalist, if prolonged inpatient stay may consider PCA pump -continue Protonix 40mg  IV    3) Nausea is well controlled with Zofran  4) Malnutrition: She is unable to tolerate clear liquids. PICC line placed for TPN on 06/21/2018. Admission weight 105.3 kg down to 102.7kg.  -TPN labs followed by pharmacy - await UGI series results to determine if a future jejunal tube possible  5) Constipation due to lack of po intake. Abdominal xray 2/20 confirmed stool  in the colon without obstruction. -Dulcolax suppository today -Water enema as needed  5) Pruritic rash unknown etiology -Benadryl as needed -consider dermatology consult if rash and pruritis worsen   LOS: 11 days   Hailey Houston  06/27/2018, 10:24 AM  GI ATTENDING  Interval history and data reviewed.  Patient personally seen and examined.  Agree with interval comprehensive progress note as  outlined above.  Unfortunate case.  Patient has metastatic duodenal adenocarcinoma with resultant biliary obstruction.  Concern now is partial duodenal obstruction based on difficulties with meals.  Admitted with post ERCP pancreatitis.  I reviewed the anatomy of her problem in detail including providing a diagram with details of her issues and anatomy.  She is taking pain medication fairly regularly.  She does look comfortable.  Her abdominal exam is benign.  Plan today is upper GI series to rule out duodenal obstruction.  She may need follow-up CT imaging as well.  I discussed both of these with her.  Hailey Houston., M.D. [facility] Division of Gastroenterology

## 2018-06-27 NOTE — Progress Notes (Signed)
Spoke with Chemo educator to request chemo education while inpatient. Royston Sinner RN to see patient today.

## 2018-06-27 NOTE — Progress Notes (Signed)
PHARMACY - ADULT TOTAL PARENTERAL NUTRITION CONSULT NOTE   Pharmacy Consult for TPN Indication: Severe pancreatitis  Patient Measurements: Height: 5' 11"  (180.3 cm) Weight: 226 lb 12.8 oz (102.9 kg) IBW/kg (Calculated) : 70.8   Body mass index is 31.63 kg/m. Usual Weight: Most recent weight 105 kg on 2/2 Current weight: 102.9 kg  Insulin Requirements: 3 units from sensitive SSI q6h - no hx DM  Current Nutrition: clear liquids  IVF: D5 1/2NS at 20 mL/hr   Central access: Double lumen PICC placed 2/6 TPN start date: 2/7  ASSESSMENT                                                                                              HPI: Pt has PMH significant for 1 month of nausea, decreased appetite, abdominal pain, and weight loss. Underwent ERCP and biliary stent placement on 1/30 with finding of a large ulcerated duodenal mass. Pt presented on 2/1 with post-ERCP pancreatitis. Pt has been unable to tolerate PO intake during admission and has had minimal PO intake for several weeks. Pharmacy consulted to dose TPN.  Significant events:  -1/30: ERCP with biliary stent placement -2/6: PICC placed -2/7: Initiate TPN -2/10: clear liquid diet -2/11: no po intake due to fear it causing pain   Today:   Glucose (goal <150): wnl  Electrolytes: Na now wnl (136); K at upper end of normal (4.7), phos remains slightly elevated at 4.7 removed from TPN yesterday from TPN; other labs wnl  Renal: SCr stable, WNL  LFTs: AST down to wnl, ALT slightly elevated but trending down to 63, Alk Phos stable at 128: Tbili down 1.3  TGs: wnl but trending up to 141 -- will monitor closely  Prealbumin: 7.2 (2/7), 8.4 (2/10)  NUTRITIONAL GOALS                                                                                 RD recs per note on 2/7: Kcal: 2200 - 2400 kcal Protein: 110 - 120 g Fluid: >/= 2.2 L/day  TPN at goal rate of 92 mL/hr will provide: 2330 kcal, 117 g protein, and 353 g dextrose,  meeting 100% of patient needs  PLAN  At 1800 today:  Advance TPN rate to 92 mL/hr for now and f/u on pt's oral intake.  Plan to advance as tolerated to the goal rate.  TPN to contain standard multivitamins and trace elements.  TPN to contain standard concentration of electrolytes except:  Continue with no phosphorus in TPN  Decrease potassium to 25 mEq/L  Continue IVF at 20 ml/hr.   Continue sensitive SSI q6h  TPN lab panels on Mondays & Thursdays.  F/u daily.   Peggyann Juba, PharmD, BCPS Pager: [phone] 06/27/2018 6:51 AM

## 2018-06-27 NOTE — Progress Notes (Signed)
Patient ID: Hailey Houston, female   DOB: [birth year], 48 y.o.   MRN: [id number]    Brief GI update: Upper GI series completed today showed Prompt passage of contrast from the stomach into the duodenal bulb. Anatomy is mildly distorted by biliary stent in place with resultant reflux of contrast into the biliary tree. Best visualized on image 1/5 is moderate narrowing of the proximal duodenum, over approximately 3 cm span. No gastric outlet obstruction. Normal caliber proximal jejunal loops. Moderate proximal duodenal narrowing, without evidence of gastricobstruction to contrast passage.  Dr. Teena Irani notified of results. I discussed the above results with the patient, advised she attempt to drink more fluids as recommended by  Dr. Havery Moros.

## 2018-06-27 NOTE — Progress Notes (Signed)
PROGRESS NOTE    Hailey Houston  DJM:[id number] DOB: Sep 15, 1970 DOA: 06/16/2018 PCP: Patient, No Pcp Per    Brief Narrative: 48 y.o.femalewith medical history significant of ADHD who presented 2/1 with post-ERCP pancreatitis. ERCP was on 1/30 in investigation of 1 month of nausea, decreased appetite, abdominal pain and weight loss with elevated LFTs and pancreatic head mass with intr- and extra-hepatic ductal dilatation. There was some concern for metastatic spread with pulmonary nodules and porta hepatis gastrohepatic area and retroperitoneal lymphadenopathy. ERCP found severely abnormal duodenum causing a distal CBD stricture she had a limited biliary sphincterectomy and balloon dilatation. Biopsies were obtained, and biliary stent placed. Since admission diet has been restricted and IVF's given with GI consulted.   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 Oncology Dr. Learta Codding consulted plan to initiate FOLFOX chemo soon. Symptomatic management with pain control.   Post ERCP pancreatitis Pain control with fentanyl patch 75 MCG started on 06/27/2018. On oral Dilaudid up to 8 mg as needed with IV Dilaudid as needed Patient reports her pain is not well controlled and hence we increased her fentanyl from 50 MCG to 52 MCG PICC line was placed and she was started on TPN for poor nutrition.  Patient is currently on clear liquid diet.   Hypokalemia Replaced   Dehydration On IV fluids.   Persistent itching Differential includes paraneoplastic versus allergic dermatitis versus possibly from Dilaudid. Oral Benadryl and Vistaril has been added.  Constipation Suppositories  ordered   Poor oral nutrition Possibly complicated by narrow duodenal lumen.  GI recommending upper GI series to rule out duodenal obstruction.   DVT prophylaxis: Lovenox Code Status: Full code Family Communication: None at bedside  disposition Plan: Pending clinical improvement.  Pain control, advance diet without abdominal pain and plan for chemotherapy as per Dr. Benay Spice.   Consultants:     Nephrology Dr. Henrene Pastor  Oncology Dr. Benay Spice.  Procedures: Upper GI series pending  Antimicrobials: None  Subjective: Patient is anxious and frustrated and feels like she is not getting better.  She does not want to eat due to fear of abdominal pain.  But she appears comfortable her vital signs are within normal limits.  Her questions in detail.  She would like to speak with the gastroenterologist and her oncologist in detail about her care plan  Objective: Vitals:   06/26/18 1426 06/26/18 2030 06/27/18 0509 06/27/18 1357  BP: 136/85 (!) 154/73 (!) 119/55 (!) 152/86  Pulse: 92 90 75 89  Resp: 12 16  14   Temp: 98.6 F (37 C) 98.2 F (36.8 C) 98 F (36.7 C)   TempSrc: Oral Oral Oral   SpO2: 100% 100% 98% 100%  Weight:   102.9 kg   Height:        Intake/Output Summary (Last 24 hours) at 06/27/2018 1605 Last data filed at 06/27/2018 1500 Gross per 24 hour  Intake 3000.28 ml  Output 1400 ml  Net 1600.28 ml   Filed Weights   06/24/18 0643 06/26/18 0642 06/27/18 0509  Weight: 103.8 kg 102.7 kg 102.9 kg    Examination:  General exam: Appears anxious and frustrated Respiratory system: Clear to auscultation. Respiratory effort normal.  Cardiovascular system: S1 & S2 heard, RRR. No JVD, murmurs, rubs,  Gastrointestinal system: Abdomen is soft, generalized tenderness, bowel sounds are good Central nervous system: Alert and oriented. No focal neurological deficits. Extremities: Symmetric 5 x 5 power. Skin: No rashes, lesions or ulcers Psychiatry: pt anxious      Data  Reviewed: I have personally reviewed following labs and imaging studies  CBC: Recent Labs  Lab 06/21/18 0523 06/22/18 0423 06/25/18 0338  WBC 4.5 4.9 6.9  NEUTROABS  --  2.8 4.1  HGB 9.3* 9.3* 10.1*  HCT 28.8* 29.0* 32.1*  MCV 97.6 98.6 99.1  PLT 395 438* 361*   Basic Metabolic Panel: Recent Labs  Lab 06/23/18 0353 06/24/18 0323 06/25/18 0338 06/26/18 0901 06/27/18 0405  NA 137 138 137 134* 136  K 3.6 3.8 4.0 4.1 4.7  CL 104 104 103 100 102  CO2 26 25 25 26 26   GLUCOSE 135* 115* 113* 120* 124*  BUN <5* 8 12 25* 20  CREATININE 0.57 0.53 0.61 0.62 0.57  CALCIUM 8.6* 8.9 9.1 9.2 9.0  MG 2.1 2.0 2.1 2.1 2.2  PHOS 4.9* 4.8* 4.2 4.7* 4.7*   GFR: Estimated Creatinine Clearance: 117.2 mL/min (by C-G formula based on SCr of 0.57 mg/dL). Liver Function Tests: Recent Labs  Lab 06/21/18 0523 06/22/18 0423 06/24/18 0323 06/25/18 0338  AST 49* 57* 41 35  ALT 101* 95* 72* 63*  ALKPHOS 149* 142* 127* 128*  BILITOT 1.8* 1.8* 1.4* 1.3*  PROT 6.5 6.2* 6.4* 7.1  ALBUMIN 3.2* 3.2* 3.3* 3.5   No results for input(s): LIPASE, AMYLASE in the last 168 hours. No results for input(s): AMMONIA in the last 168 hours. Coagulation Profile: No results for input(s): INR, PROTIME in the last 168 hours. Cardiac Enzymes: No results for input(s): CKTOTAL, CKMB, CKMBINDEX, TROPONINI in the last 168 hours. BNP (last 3 results) No results for input(s): PROBNP in the last 8760 hours. HbA1C: No results for input(s): HGBA1C in the last 72 hours. CBG: Recent Labs  Lab 06/26/18 1218 06/26/18 1816 06/27/18 0022 06/27/18 0521 06/27/18 1152  GLUCAP 121* 126* 142* 110* 117*   Lipid Profile: Recent Labs    06/25/18 0338  TRIG 141   Thyroid Function Tests: No results for input(s): TSH, T4TOTAL, FREET4, T3FREE, THYROIDAB in the last 72 hours. Anemia Panel: No results for input(s): VITAMINB12, FOLATE, FERRITIN, TIBC, IRON, RETICCTPCT in the last 72 hours. Sepsis Labs: No results for  input(s): PROCALCITON, LATICACIDVEN in the last 168 hours.  No results found for this or any previous visit (from the past 240 hour(s)).       Radiology Studies: Dg Abd 2 Views  Result Date: 06/25/2018 CLINICAL DATA:  Epigastric abdominal pain. EXAM: ABDOMEN - 2 VIEW COMPARISON:  CT 44/31/5400 FINDINGS: Metallic biliary stent is noted in place. There is pneumobilia. Nonobstructive bowel gas pattern. Moderate stool in the colon. No free air. IUD noted in the pelvis. Multiple calcified phleboliths in the pelvis. IMPRESSION: No evidence of bowel obstruction or free air. Biliary stent and pneumobilia noted. Electronically Signed   By: Rolm Baptise M.D.   On: 06/25/2018 19:25   Dg Duanne Limerick W Single Cm (sol Or Thin Ba)  Result Date: 06/27/2018 CLINICAL DATA:  Duodenal adenocarcinoma. Evaluate for gastric outlet obstruction. EXAM: UPPER GI SERIES WITHOUT KUB TECHNIQUE: Routine upper GI series was performed with thin barium. FLUOROSCOPY TIME:  Fluoroscopy Time:  5 minutes and 12 seconds Radiation Exposure Index (if provided by the fluoroscopic device): 197 mGy Number  of Acquired Spot Images: 0 COMPARISON:  Acute abdomen series of 06/25/2018. ERCP/Endoscopic ultrasound report of 06/14/2018 FINDINGS: Single-contrast, focused upper GI was performed. No esophageal stricture identified. Normal caliber of the stomach, without retained gastric contents. Prompt passage of contrast from the stomach into the duodenal bulb. Anatomy is mildly distorted by biliary stent in place with resultant reflux of contrast into the biliary tree. Best visualized on image 1/5 is moderate narrowing of the proximal duodenum, over approximately 3 cm span. No gastric outlet obstruction. Normal caliber proximal jejunal loops. IMPRESSION: Moderate proximal duodenal narrowing, without evidence of gastric obstruction to contrast passage Electronically Signed   By: Abigail Miyamoto M.D.   On: 06/27/2018 14:08        Scheduled Meds: . bisacodyl   10 mg Rectal Once  . clobetasol cream   Topical BID  . enoxaparin (LOVENOX) injection  40 mg Subcutaneous Q24H  . famotidine  20 mg Oral BID  . fentaNYL  1 patch Transdermal Q72H  . insulin aspart  0-9 Units Subcutaneous Q6H  . pantoprazole sodium  40 mg Per Tube BID  . triamcinolone cream   Topical TID   Continuous Infusions: . dextrose 5 % and 0.45% NaCl 20 mL/hr at 06/27/18 0600  . TPN ADULT (ION) 80 mL/hr at 06/27/18 0600  . TPN ADULT (ION)       LOS: 11 days    Time spent: 35 minutes.     Hosie Poisson, MD [facility] Pager [phone]  If 7PM-7AM, please contact night-coverage [email or web address] Password [facility] 06/27/2018, 4:05 PM

## 2018-06-27 NOTE — Progress Notes (Deleted)
error 

## 2018-06-27 NOTE — Progress Notes (Signed)
[facility] CSW Progress Notes  Request received from nurse navigator D Placke to reach out to patient to offer support to address parenting concerns and how to support her children in light of cancer diagnosis and treatment.  Noted patient is currently inpatient, called and left VM w offer to visit this afternoon if patient is agreeable.  Hailey Shell, LCSW Clinical Social Worker Phone:  [phone]

## 2018-06-27 NOTE — Progress Notes (Signed)
Pt drank 360 mL chicken broth and ate an [place] ice. Per pt, this set well on her stomach.

## 2018-06-28 ENCOUNTER — Inpatient Hospital Stay: Payer: 59 | Attending: Oncology

## 2018-06-28 ENCOUNTER — Telehealth: Payer: Self-pay

## 2018-06-28 ENCOUNTER — Inpatient Hospital Stay: Payer: 59

## 2018-06-28 ENCOUNTER — Inpatient Hospital Stay: Payer: 59 | Admitting: Oncology

## 2018-06-28 ENCOUNTER — Encounter: Payer: Self-pay | Admitting: General Practice

## 2018-06-28 DIAGNOSIS — Z79899 Other long term (current) drug therapy: Secondary | ICD-10-CM | POA: Insufficient documentation

## 2018-06-28 DIAGNOSIS — K859 Acute pancreatitis without necrosis or infection, unspecified: Secondary | ICD-10-CM | POA: Insufficient documentation

## 2018-06-28 DIAGNOSIS — K831 Obstruction of bile duct: Secondary | ICD-10-CM | POA: Insufficient documentation

## 2018-06-28 DIAGNOSIS — D649 Anemia, unspecified: Secondary | ICD-10-CM | POA: Insufficient documentation

## 2018-06-28 DIAGNOSIS — Z5111 Encounter for antineoplastic chemotherapy: Secondary | ICD-10-CM | POA: Insufficient documentation

## 2018-06-28 DIAGNOSIS — C170 Malignant neoplasm of duodenum: Secondary | ICD-10-CM | POA: Insufficient documentation

## 2018-06-28 LAB — COMPREHENSIVE METABOLIC PANEL
ALT: 40 U/L (ref 0–44)
AST: 29 U/L (ref 15–41)
Albumin: 3.4 g/dL — ABNORMAL LOW (ref 3.5–5.0)
Alkaline Phosphatase: 120 U/L (ref 38–126)
Anion gap: 8 (ref 5–15)
BUN: 18 mg/dL (ref 6–20)
CO2: 27 mmol/L (ref 22–32)
CREATININE: 0.59 mg/dL (ref 0.44–1.00)
Calcium: 8.7 mg/dL — ABNORMAL LOW (ref 8.9–10.3)
Chloride: 102 mmol/L (ref 98–111)
GFR calc Af Amer: >60 mL/min (ref >60)
GFR calc non Af Amer: >60 mL/min (ref >60)
Glucose, Bld: 128 mg/dL — ABNORMAL HIGH (ref 70–99)
Potassium: 4.1 mmol/L (ref 3.5–5.1)
Sodium: 137 mmol/L (ref 135–145)
Total Bilirubin: 1.2 mg/dL (ref 0.3–1.2)
Total Protein: 6.5 g/dL (ref 6.5–8.1)

## 2018-06-28 LAB — PHOSPHORUS: Phosphorus: 4.2 mg/dL (ref 2.5–4.6)

## 2018-06-28 LAB — CEA: CEA: 10.9 ng/mL — ABNORMAL HIGH (ref 0.0–4.7)

## 2018-06-28 LAB — GLUCOSE, CAPILLARY
Glucose-Capillary: 113 mg/dL — ABNORMAL HIGH (ref 70–99)
Glucose-Capillary: 115 mg/dL — ABNORMAL HIGH (ref 70–99)
Glucose-Capillary: 130 mg/dL — ABNORMAL HIGH (ref 70–99)
Glucose-Capillary: 130 mg/dL — ABNORMAL HIGH (ref 70–99)
Glucose-Capillary: 135 mg/dL — ABNORMAL HIGH (ref 70–99)

## 2018-06-28 LAB — MAGNESIUM: Magnesium: 2.1 mg/dL (ref 1.7–2.4)

## 2018-06-28 MED ORDER — DIPHENHYDRAMINE HCL 50 MG PO CAPS
50.0000 mg | ORAL_CAPSULE | Freq: Four times a day (QID) | ORAL | Status: DC | PRN
  Filled 2018-06-28 (×2): qty 1

## 2018-06-28 MED ORDER — SENNOSIDES-DOCUSATE SODIUM 8.6-50 MG PO TABS
2.0000 | ORAL_TABLET | Freq: Two times a day (BID) | ORAL | Status: DC
  Administered 2018-06-28 – 2018-06-30 (×5): 2 via ORAL
  Filled 2018-06-28 (×4): qty 2

## 2018-06-28 MED ORDER — SUCRALFATE 1 GM/10ML PO SUSP
1.0000 g | Freq: Three times a day (TID) | ORAL | Status: DC
  Administered 2018-06-28 – 2018-06-30 (×6): 1 g via ORAL
  Filled 2018-06-28 (×7): qty 10

## 2018-06-28 MED ORDER — LORAZEPAM 0.5 MG PO TABS
0.5000 mg | ORAL_TABLET | Freq: Four times a day (QID) | ORAL | Status: DC | PRN
  Administered 2018-06-28 – 2018-06-30 (×6): 0.5 mg via ORAL
  Filled 2018-06-28 (×7): qty 1

## 2018-06-28 MED ORDER — TRAVASOL 10 % IV SOLN
INTRAVENOUS | Status: AC
  Administered 2018-06-28: 18:00:00 via INTRAVENOUS
  Filled 2018-06-28: qty 1170.24

## 2018-06-28 NOTE — Telephone Encounter (Signed)
Mother called to ask questions about treatment plan and how best to support her daughter. General information provided.

## 2018-06-28 NOTE — Progress Notes (Signed)
PROGRESS NOTE    Hailey Houston  XIP:[id number] DOB: Dec 07, 1970 DOA: 06/16/2018 PCP: Patient, No Pcp Per    Brief Narrative: 48 y.o.femalewith medical history significant of ADHD who presented 2/1 with post-ERCP pancreatitis. ERCP was on 1/30 in investigation of 1 month of nausea, decreased appetite, abdominal pain and weight loss with elevated LFTs and pancreatic head mass with intr- and extra-hepatic ductal dilatation. There was some concern for metastatic spread with pulmonary nodules and porta hepatis gastrohepatic area and retroperitoneal lymphadenopathy. ERCP found severely abnormal duodenum causing a distal CBD stricture she had a limited biliary sphincterectomy and balloon dilatation. Biopsies were obtained, and biliary stent placed. Since admission diet has been restricted and IVF's given with GI consulted.   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 Oncology Dr. Learta Codding consulted plan to initiate FOLFOX chemo soon. Symptomatic management with pain control.   Post ERCP pancreatitis Pain control with fentanyl patch 75 MCG started on 06/27/2018. On oral Dilaudid up to 8 mg and fentanyl.  PICC line was placed and she was started on TPN for poor nutrition.  Patient is currently on clear liquid diet , advanced diet as tolerated.    Hypokalemia Replaced   Dehydration Resolved.    Persistent itching Differential includes paraneoplastic versus allergic dermatitis versus possibly from Dilaudid. Oral Benadryl and Vistaril has been added.  Constipation Suppositories ordered   Poor oral nutrition Possibly complicated by narrow duodenal lumen.  GI recommending upper GI  series to rule out duodenal obstruction.UGI ruled out obstruction.    DVT prophylaxis: Lovenox Code Status: Full code Family Communication: None at bedside  disposition Plan: Pending clinical improvement.  Pain control, advance diet without abdominal pain and plan for chemotherapy as per Dr. Benay Spice.   Consultants:    Nephrology Dr. Henrene Pastor  Oncology Dr. Benay Spice.  Procedures: Upper GI series pending  Antimicrobials: None  Subjective: she appears comfortable her vital signs are within normal limits.answered her questions in detail. No IV pain meds since yesterday.  Objective: Vitals:   06/27/18 0509 06/27/18 1357 06/27/18 2019 06/28/18 0450  BP: (!) 119/55 (!) 152/86 130/85 132/78  Pulse: 75 89 89 94  Resp:  14 17 16   Temp: 98 F (36.7 C)  98.2 F (36.8 C) 98.4 F (36.9 C)  TempSrc: Oral  Oral Oral  SpO2: 98% 100% 100% 98%  Weight: 102.9 kg   103.2 kg  Height:        Intake/Output Summary (Last 24 hours) at 06/28/2018 1248 Last data filed at 06/28/2018 1214 Gross per 24 hour  Intake 3021.31 ml  Output 800 ml  Net 2221.31 ml   Filed Weights   06/26/18 0642 06/27/18 0509 06/28/18 0450  Weight: 102.7 kg 102.9 kg 103.2 kg    Examination:  General exam: calmer today.  Respiratory system: Clear to auscultation. Respiratory effort normal. No wheezing or rhonchi.  Cardiovascular system: S1 & S2 heard, RRR. No JVD, murmurs, rubs,  Gastrointestinal system: Abdomen is soft, generalized tenderness, bowel sounds are good Central nervous system: Alert and oriented. Grossly non focal Extremities: Symmetric 5 x 5 power. Skin: No rashes, lesions or ulcers Psychiatry: mood okay.     Data Reviewed:  I have personally reviewed following labs and imaging studies  CBC: Recent Labs  Lab 06/22/18 0423 06/25/18 0338  WBC 4.9 6.9  NEUTROABS 2.8 4.1  HGB 9.3* 10.1*  HCT 29.0* 32.1*  MCV 98.6 99.1  PLT 438* 211*   Basic Metabolic Panel: Recent Labs  Lab 06/24/18 0323  06/25/18 0338 06/26/18 0901 06/27/18 0405 06/28/18 0423  NA 138 137 134* 136 137  K 3.8 4.0 4.1 4.7 4.1  CL 104 103 100 102 102  CO2 25 25 26 26 27   GLUCOSE 115* 113* 120* 124* 128*  BUN 8 12 25* 20 18  CREATININE 0.53 0.61 0.62 0.57 0.59  CALCIUM 8.9 9.1 9.2 9.0 8.7*  MG 2.0 2.1 2.1 2.2 2.1  PHOS 4.8* 4.2 4.7* 4.7* 4.2   GFR: Estimated Creatinine Clearance: 117.5 mL/min (by C-G formula based on SCr of 0.59 mg/dL). Liver Function Tests: Recent Labs  Lab 06/22/18 0423 06/24/18 0323 06/25/18 0338 06/28/18 0423  AST 57* 41 35 29  ALT 95* 72* 63* 40  ALKPHOS 142* 127* 128* 120  BILITOT 1.8* 1.4* 1.3* 1.2  PROT 6.2* 6.4* 7.1 6.5  ALBUMIN 3.2* 3.3* 3.5 3.4*   No results for input(s): LIPASE, AMYLASE in the last 168 hours. No results for input(s): AMMONIA in the last 168 hours. Coagulation Profile: No results for input(s): INR, PROTIME in the last 168 hours. Cardiac Enzymes: No results for input(s): CKTOTAL, CKMB, CKMBINDEX, TROPONINI in the last 168 hours. BNP (last 3 results) No results for input(s): PROBNP in the last 8760 hours. HbA1C: No results for input(s): HGBA1C in the last 72 hours. CBG: Recent Labs  Lab 06/27/18 0521 06/27/18 1152 06/27/18 2355 06/28/18 0516 06/28/18 1212  GLUCAP 110* 117* 103* 113* 130*   Lipid Profile: No results for input(s): CHOL, HDL, LDLCALC, TRIG, CHOLHDL, LDLDIRECT in the last 72 hours. Thyroid Function Tests: No results for input(s): TSH, T4TOTAL, FREET4, T3FREE, THYROIDAB in the last 72 hours. Anemia Panel: No results for input(s): VITAMINB12, FOLATE, FERRITIN, TIBC, IRON, RETICCTPCT in the last 72 hours. Sepsis Labs: No results for input(s): PROCALCITON, LATICACIDVEN in the last 168 hours.  No results found for this or any previous visit (from the past 240 hour(s)).       Radiology Studies: Dg Paulene Floor Single Cm (sol Or Thin Ba)  Result Date: 06/27/2018 CLINICAL DATA:  Duodenal adenocarcinoma. Evaluate for gastric  outlet obstruction. EXAM: UPPER GI SERIES WITHOUT KUB TECHNIQUE: Routine upper GI series was performed with thin barium. FLUOROSCOPY TIME:  Fluoroscopy Time:  5 minutes and 12 seconds Radiation Exposure Index (if provided by the fluoroscopic device): 197 mGy Number of Acquired Spot Images: 0 COMPARISON:  Acute abdomen series of 06/25/2018. ERCP/Endoscopic ultrasound report of 06/14/2018 FINDINGS: Single-contrast, focused upper GI was performed. No esophageal stricture identified. Normal caliber of the stomach, without retained gastric contents. Prompt passage of contrast from the stomach into the duodenal bulb. Anatomy is mildly distorted by biliary stent in place with resultant reflux of contrast into the biliary tree. Best visualized on image 1/5 is moderate narrowing of the proximal duodenum, over approximately 3 cm span. No gastric outlet obstruction. Normal caliber proximal jejunal loops. IMPRESSION: Moderate proximal duodenal narrowing, without evidence of gastric obstruction to contrast passage Electronically Signed   By: Abigail Miyamoto M.D.   On: 06/27/2018 14:08        Scheduled Meds: . bisacodyl  10 mg Rectal Once  . clobetasol cream   Topical BID  . enoxaparin (LOVENOX) injection  40 mg Subcutaneous Q24H  . famotidine  20 mg Oral BID  . fentaNYL  1 patch Transdermal Q72H  . insulin aspart  0-9 Units Subcutaneous Q6H  . pantoprazole (PROTONIX) IV  40 mg Intravenous Q12H  . triamcinolone cream   Topical TID   Continuous Infusions: . dextrose 5 % and 0.45% NaCl 20 mL/hr at 06/28/18 0600  . TPN ADULT (ION) 92 mL/hr at 06/28/18 0600  . TPN ADULT (ION)       LOS: 12 days    Time spent: 32 minutes.     Hosie Poisson, MD [facility] Pager [phone]  If 7PM-7AM, please contact night-coverage [email or web address] Password [facility] 06/28/2018, 12:48 PM

## 2018-06-28 NOTE — Progress Notes (Signed)
[facility] CSW Progress Notes  Met w patient in hospital room at request of Arna Snipe, nurse navigator.  Discussed experience of cancer diagnosis, treatment and impact on patient.  Of particular importance to patient is developing strategy for supporting her children (ages 43 and 23) throughout this process. Patient shared custody w children's father and children lived between both homes on regular schedule.  Has support from family and faith community.   Discussed ways to think about how to convey information about diagnosis, expected treatment.  Provided handouts on parenting in context of cancer.  Began process of helping patient develop strategy for open communication w sons as well as supportive disclosure of important information.  Scheduled to see patient again during infusion 2/20.  She has my contact information and is encouraged to reach out as needed for support/resources.  Also referred to [facility] for help w transport needs.    Edwyna Shell, LCSW Clinical Social Worker Phone:  [phone]

## 2018-06-28 NOTE — Progress Notes (Signed)
Met with patient to offer support and to answer questions regarding treatment plan.

## 2018-06-28 NOTE — Progress Notes (Signed)
[facility] Progress Note  CC:  Metastatic duodenal adenocarcinoma with metastasis  Subjective: She slept 6 hours last night, having less abdominal pain this morning, passing gas per there rectum, feeling a little bloated, no BM. Tolerating tea, broth and [place] ice without distress.  Objective:  Vital signs in last 24 hours: Temp:  [98.2 F (36.8 C)-98.4 F (36.9 C)] 98.4 F (36.9 C) (02/13 0450) Pulse Rate:  [89-94] 94 (02/13 0450) Resp:  [14-17] 16 (02/13 0450) BP: (130-152)/(78-86) 132/78 (02/13 0450) SpO2:  [98 %-100 %] 98 % (02/13 0450) Weight:  [103.2 kg] 103.2 kg (02/13 0450) Last BM Date: 06/24/18 General:   Alert,  Well-developed,    in NAD Heart:  Regular rate and rhythm; no murmurs Pulm: Lungs clear throughout. Abdomen:  Soft, nondistended, mild epigastric and lower abdominal tenderness without rebound or guarding, + BS x 4 quadrants.   Extremities:  Without edema. Neurologic:  Alert and  oriented x4;  grossly normal neurologically. Psych:  Alert and cooperative. Normal mood and affect.  Intake/Output from previous day: 02/12 0701 - 02/13 0700 In: 2931.3 [P.O.:610; I.V.:2321.3] Out: 800 [Urine:800] Intake/Output this shift: No intake/output data recorded.  Lab Results: No results for input(s): WBC, HGB, HCT, PLT in the last 72 hours. BMET Recent Labs    06/26/18 0901 06/27/18 0405 06/28/18 0423  NA 134* 136 137  K 4.1 4.7 4.1  CL 100 102 102  CO2 26 26 27   GLUCOSE 120* 124* 128*  BUN 25* 20 18  CREATININE 0.62 0.57 0.59  CALCIUM 9.2 9.0 8.7*   LFT Recent Labs    06/28/18 0423  PROT 6.5  ALBUMIN 3.4*  AST 29  ALT 40  ALKPHOS 120  BILITOT 1.2    Dg Ugi W Single Cm (sol Or Thin Ba)  Result Date: 06/27/2018 CLINICAL DATA:  Duodenal adenocarcinoma. Evaluate for gastric outlet obstruction. EXAM: UPPER GI SERIES WITHOUT KUB TECHNIQUE: Routine upper GI series was performed with thin barium. FLUOROSCOPY TIME:  Fluoroscopy Time:   5 minutes and 12 seconds Radiation Exposure Index (if provided by the fluoroscopic device): 197 mGy Number of Acquired Spot Images: 0 COMPARISON:  Acute abdomen series of 06/25/2018. ERCP/Endoscopic ultrasound report of 06/14/2018 FINDINGS: Single-contrast, focused upper GI was performed. No esophageal stricture identified. Normal caliber of the stomach, without retained gastric contents. Prompt passage of contrast from the stomach into the duodenal bulb. Anatomy is mildly distorted by biliary stent in place with resultant reflux of contrast into the biliary tree. Best visualized on image 1/5 is moderate narrowing of the proximal duodenum, over approximately 3 cm span. No gastric outlet obstruction. Normal caliber proximal jejunal loops. IMPRESSION: Moderate proximal duodenal narrowing, without evidence of gastric obstruction to contrast passage Electronically Signed   By: Abigail Miyamoto M.D.   On: 06/27/2018 14:08    Assessment / Plan: 37) 48 year old female with duodenal adenocarcinomawithmetastasis resulting  biliary obstruction.S/P ERCP 1/30/2020with stent placement to the CBD complicated by post ERCP pancreatitis 06/16/2018. LFTs and Lipase levels trending downward. Biopsy of a left supraclavicular lymph node 06/22/2018 confirmed metastatic adenocarcinoma. UGI series 06/27/2018 showed moderate proximal duodenal narrowing without evidence of gastric obstruction. -clear liquid diet, increase po fluid intake -followed by Dr. Benay Spice, to start FOLFOX 07/02/2018 -ok to give water enema to facilitate BM with c/o feeling bloated, (abd xray 06/25/2018 showed moderate stool in the colon),  Dulcolax suppository resulted in abdominal cramping.  2) Epigastric pain secondary to # 1.  -continue Fentanyl patch,  Dilaudid po/IV as managed by hospitalist   3) Nausea is well controlled with Zofran  4) Malnutrition: She is unable to tolerate clear liquids. PICC line placed for TPN on 06/21/2018.Albumin 3.4. Admission  weight 105.3 kg down to 102.7kg.  -TPN labs followed by pharmacy -Clear liquid diet po   LOS: 12 days   Noralyn Pick  06/28/2018, 9:49 AM

## 2018-06-28 NOTE — Progress Notes (Signed)
PHARMACY - ADULT TOTAL PARENTERAL NUTRITION CONSULT NOTE   Pharmacy Consult for TPN Indication: Severe pancreatitis  Patient Measurements: Height: 5\' 11"  (180.3 cm) Weight: 226 lb 12.8 oz (102.9 kg) IBW/kg (Calculated) : 70.8   Body mass index is 31.63 kg/m. Usual Weight: Most recent weight 105 kg on 2/2 Current weight: 102.9 kg  Insulin Requirements: 0 units from sensitive SSI q6h - no hx DM  Current Nutrition: clear liquids  IVF: D5 1/2NS at 20 mL/hr   Central access: Double lumen PICC placed 2/6 TPN start date: 2/7  ASSESSMENT                                                                                              HPI: Pt has PMH significant for 1 month of nausea, decreased appetite, abdominal pain, and weight loss. Underwent ERCP and biliary stent placement on 1/30 with finding of a large ulcerated duodenal mass. Pt presented on 2/1 with post-ERCP pancreatitis. Pt has been unable to tolerate PO intake during admission and has had minimal PO intake for several weeks. Pharmacy consulted to dose TPN.  Significant events:  -1/30: ERCP with biliary stent placement -2/6: PICC placed -2/7: Initiate TPN -2/10: clear liquid diet -2/11: no po intake due to fear it will cause abdominal pain -2/12: upper GI series to rule out duodenal obstruction   Today:   Glucose (goal <150): wnl  Electrolytes: all WNL including corrected Ca  Renal: SCr stable, WNL  LFTs: all improved to WNL  TGs: 81 (2/7) wnl but trending up to 141 (2/10) -- will monitor closely  Prealbumin: 7.2 (2/7), 8.4 (2/10)  NUTRITIONAL GOALS                                                                                 RD recs per note on 2/7: Kcal: 2200 - 2400 kcal Protein: 110 - 120 g Fluid: >/= 2.2 L/day  TPN at goal rate of 92 mL/hr will provide: 2330 kcal, 117 g protein, and 353 g dextrose, meeting 100% of patient needs  PLAN                                                                                                                At 1800 today:  Continue TPN at goal rate 92 mL/hr for now and f/u on pt's  oral intake.  TPN to contain standard multivitamins and trace elements.  TPN to contain standard concentration of electrolytes except:  Continue with no phosphorus in TPN  Continue with decreased potassium at 25 mEq/L  Continue IVF at 20 ml/hr.   Continue sensitive SSI q6h  TPN lab panels on Mondays & Thursdays.  F/u daily.   Peggyann Juba, PharmD, BCPS Pager: [phone] 06/28/2018 7:00 AM

## 2018-06-28 NOTE — Progress Notes (Signed)
IP PROGRESS NOTE  Subjective:   Hailey Houston reports improved abdominal pain.  She is now taking oral Dilaudid.  She has also noted improvement in the rash and pruritus with hydroxyzine.  She tolerated liquids last night. Objective: Vital signs in last 24 hours: Blood pressure 132/78, pulse 94, temperature 98.4 F (36.9 C), temperature source Oral, resp. rate 16, height 5\' 11"  (1.803 m), weight 227 lb 9.6 oz (103.2 kg), SpO2 98 %.  Intake/Output from previous day: 02/12 0701 - 02/13 0700 In: 2931.3 [P.O.:610; I.V.:2321.3] Out: 800 [Urine:800]  Physical Exam: Not performed today     Lab Results: No results for input(s): WBC, HGB, HCT, PLT in the last 72 hours.  BMET Recent Labs    06/27/18 0405 06/28/18 0423  NA 136 137  K 4.7 4.1  CL 102 102  CO2 26 27  GLUCOSE 124* 128*  BUN 20 18  CREATININE 0.57 0.59  CALCIUM 9.0 8.7*    Lab Results  Component Value Date   CEA1 10.9 (H) 06/26/2018    Studies/Results: Dg Duanne Limerick W Single Cm (sol Or Thin Ba)  Result Date: 06/27/2018 CLINICAL DATA:  Duodenal adenocarcinoma. Evaluate for gastric outlet obstruction. EXAM: UPPER GI SERIES WITHOUT KUB TECHNIQUE: Routine upper GI series was performed with thin barium. FLUOROSCOPY TIME:  Fluoroscopy Time:  5 minutes and 12 seconds Radiation Exposure Index (if provided by the fluoroscopic device): 197 mGy Number of Acquired Spot Images: 0 COMPARISON:  Acute abdomen series of 06/25/2018. ERCP/Endoscopic ultrasound report of 06/14/2018 FINDINGS: Single-contrast, focused upper GI was performed. No esophageal stricture identified. Normal caliber of the stomach, without retained gastric contents. Prompt passage of contrast from the stomach into the duodenal bulb. Anatomy is mildly distorted by biliary stent in place with resultant reflux of contrast into the biliary tree. Best visualized on image 1/5 is moderate narrowing of the proximal duodenum, over approximately 3 cm span. No gastric outlet  obstruction. Normal caliber proximal jejunal loops. IMPRESSION: Moderate proximal duodenal narrowing, without evidence of gastric obstruction to contrast passage Electronically Signed   By: Abigail Miyamoto M.D.   On: 06/27/2018 14:08    Medications: I have reviewed the patient's current medications.  Assessment/Plan: 1.  Adenocarcinoma of the duodenum  CT abdomen/pelvis 06/16/2018- changes of pancreatitis, fullness at the head of the pancreas without a defined mass, multiple subcentimeter peripancreatic lymph nodes, distal common bile duct stent, multiple nodules at the lung bases  EUS 06/14/2018-ulcerated mucosa/mass at the distal duodenal bulb with extension to the pancreas head, portal adenopathy, biopsy of duodenal mass-adenocarcinoma  ERCP 06/14/2018- distal duodenal bulb mass, 1 cm proximal to the ampulla causing biliary obstruction, status post placement of a metal common bile duct stent  CT chest 06/20/2018- bilateral pulmonary nodules consistent with metastatic disease, supraclavicular, mediastinal, and hilar lymphadenopathy, changes of pancreatitis  Ultrasound-guided biopsy of a left supraclavicular lymph node 06/22/2018-metastatic adenocarcinoma  2.  Biliary obstruction secondary to the duodenal mass, status post placement of a bile duct stent 06/14/2018 3.  Post ERCP pancreatitis 06/16/2018 4.  Anemia secondary to phlebotomy and multiple procedures 5.  G2, P2 6.  Nausea secondary to #1  Upper GI 06/27/2018- moderate proximal duodenal narrowing, no evidence of gastric outlet obstruction 7.  Attention deficit disorder 8.  Abdominal pain- secondary to the small bowel carcinoma versus pancreatitis  9.  Rash   Hailey Houston has been diagnosed with metastatic small bowel carcinoma.  She has abdominal pain secondary to the small bowel tumor versus pancreatitis.  The pain  appears to be under better control with the Duragesic patch.   She has nausea secondary to the small bowel tumor.  She was able  to tolerate liquids last night.  She is currently maintained on TPN.  She will have teaching with a chemotherapy nurse today.  She is scheduled to begin outpatient FOLFOX 07/03/2018.   Recommendations: 1.  Advance diet and wean TNA to off if okay with gastroenterology 2.  Continue the Duragesic patch and oral Dilaudid for breakthrough pain 3.  Outpatient follow-up for an office visit 07/02/2018 and chemotherapy 07/03/2018 4.  Keep PICC in place for chemotherapy  LOS: 12 days   Betsy Coder, MD   06/28/2018, 2:26 PM

## 2018-06-29 ENCOUNTER — Telehealth: Payer: Self-pay | Admitting: Oncology

## 2018-06-29 LAB — COMPREHENSIVE METABOLIC PANEL
ALT: 31 U/L (ref 0–44)
AST: 21 U/L (ref 15–41)
Albumin: 3.3 g/dL — ABNORMAL LOW (ref 3.5–5.0)
Alkaline Phosphatase: 121 U/L (ref 38–126)
Anion gap: 5 (ref 5–15)
BILIRUBIN TOTAL: 1 mg/dL (ref 0.3–1.2)
BUN: 18 mg/dL (ref 6–20)
CO2: 26 mmol/L (ref 22–32)
Calcium: 8.5 mg/dL — ABNORMAL LOW (ref 8.9–10.3)
Chloride: 103 mmol/L (ref 98–111)
Creatinine, Ser: 0.54 mg/dL (ref 0.44–1.00)
GFR calc Af Amer: >60 mL/min (ref >60)
GFR calc non Af Amer: >60 mL/min (ref >60)
Glucose, Bld: 160 mg/dL — ABNORMAL HIGH (ref 70–99)
Potassium: 3.7 mmol/L (ref 3.5–5.1)
Sodium: 134 mmol/L — ABNORMAL LOW (ref 135–145)
Total Protein: 6.3 g/dL — ABNORMAL LOW (ref 6.5–8.1)

## 2018-06-29 LAB — GLUCOSE, CAPILLARY
Glucose-Capillary: 98 mg/dL (ref 70–99)
Glucose-Capillary: 139 mg/dL — ABNORMAL HIGH (ref 70–99)
Glucose-Capillary: 135 mg/dL — ABNORMAL HIGH (ref 70–99)
Glucose-Capillary: 101 mg/dL — ABNORMAL HIGH (ref 70–99)

## 2018-06-29 LAB — MAGNESIUM: Magnesium: 2.1 mg/dL (ref 1.7–2.4)

## 2018-06-29 LAB — PHOSPHORUS: PHOSPHORUS: 3.4 mg/dL (ref 2.5–4.6)

## 2018-06-29 MED ORDER — PANTOPRAZOLE SODIUM 40 MG PO TBEC
40.0000 mg | DELAYED_RELEASE_TABLET | Freq: Two times a day (BID) | ORAL | Status: DC
  Administered 2018-06-29 – 2018-06-30 (×2): 40 mg via ORAL
  Filled 2018-06-29 (×2): qty 1

## 2018-06-29 MED ORDER — FENTANYL 75 MCG/HR TD PT72
1.0000 | MEDICATED_PATCH | TRANSDERMAL | Status: DC
  Administered 2018-06-29: 1 via TRANSDERMAL
  Filled 2018-06-29: qty 1

## 2018-06-29 MED ORDER — FENTANYL 25 MCG/HR TD PT72
1.0000 | MEDICATED_PATCH | TRANSDERMAL | Status: DC
  Administered 2018-06-29: 1 via TRANSDERMAL
  Filled 2018-06-29: qty 1

## 2018-06-29 NOTE — Telephone Encounter (Signed)
Called pt to confirm appt on 2/17 - unable to reach patient - left message with appt date and time

## 2018-06-29 NOTE — Telephone Encounter (Signed)
Spoke with patient on the phone regarding the transportation program. Have set her up with rides for future appointments and will meet with her on 2/18 to get her waiver signed.

## 2018-06-29 NOTE — Progress Notes (Signed)
PROGRESS NOTE    Hailey Houston  NFA:[id number] DOB: 07-04-1970 DOA: 06/16/2018 PCP: Patient, No Pcp Per    Brief Narrative: 48 y.o.femalewith medical history significant of ADHD who presented 2/1 with post-ERCP pancreatitis. ERCP was on 1/30 in investigation of 1 month of nausea, decreased appetite, abdominal pain and weight loss with elevated LFTs and pancreatic head mass with intr- and extra-hepatic ductal dilatation. There was some concern for metastatic spread with pulmonary nodules and porta hepatis gastrohepatic area and retroperitoneal lymphadenopathy. ERCP found severely abnormal duodenum causing a distal CBD stricture she had a limited biliary sphincterectomy and balloon dilatation. Biopsies were obtained, and biliary stent placed. Since admission diet has been restricted and IVF's given with GI consulted.   Assessment & Plan:   Active Problems:   Adenocarcinoma of duodenum (HCC)   Pancreatitis   Hypokalemia   Dehydration   Nausea and vomiting   Acute pancreatitis   Pulmonary nodules   Malnutrition of moderate degree   Epigastric pain   Goals of care, counseling/discussion   Metastatic adenocarcinoma of the duodenum With LN mets.  Oncology Dr. Learta Codding consulted plan to initiate FOLFOX chemo on tuesday.  Symptomatic management with pain control, anti emetics.     Post ERCP pancreatitis Pain control with fentanyl patch 75 MCG started on 06/27/2018, initially planned to wean her fentanyl, but patient would like to continue the same dose, as her pain is better controlled with 75 mcg. In addition to fentanyl she is also on  oral Dilaudid up to 8 mg for breakthrough pain.  PICC line was placed and she was started on TPN for poor nutrition.  Patient is currently on clear liquid diet , advanced diet as tolerated. Plan to d/c TPN on discharge.    Hypokalemia Replaced   Dehydration Resolved.    Persistent itching Differential includes paraneoplastic versus allergic  dermatitis versus possibly from Dilaudid. Oral Benadryl and Vistaril has been added.   Constipation Suppositories ordered.   Poor oral nutrition Possibly complicated by narrow duodenal lumen.  GI recommending upper GI series to rule out duodenal obstruction. UGI series ruled out obstruction.   DVT prophylaxis: Lovenox Code Status: Full code Family Communication: None at bedside  disposition Plan: .  Pain control, advance diet without abdominal pain and plan for chemotherapy as per Dr. Benay Spice. Possible d/c in the next 24 hours if her symptoms are controlled.    Consultants:    Gastroenterology Dr. Henrene Pastor  Oncology Dr. Benay Spice.    Procedures: Upper GI series   Antimicrobials: None  Subjective:  upset about decreasing the dose of fentanyl.  No abdominal pain, slight nausea earlier today, but no vomiting.  No chest pain.   Objective: Vitals:   06/28/18 1436 06/28/18 2346 06/29/18 0638 06/29/18 1254  BP: (!) 146/85 131/84 125/76 132/79  Pulse: 90 83 86 89  Resp: 10 18 16 14   Temp: 98.6 F (37 C) 98.6 F (37 C) 98.9 F (37.2 C) 98.3 F (36.8 C)  TempSrc: Oral Oral Oral Oral  SpO2: 100% 98% 97% 99%  Weight:   104.1 kg   Height:        Intake/Output Summary (Last 24 hours) at 06/29/2018 1834 Last data filed at 06/29/2018 1400 Gross per 24 hour  Intake 3154.69 ml  Output 1700 ml  Net 1454.69 ml   Filed Weights   06/27/18 0509 06/28/18 0450 06/29/18 4696  Weight: 102.9 kg 103.2 kg 104.1 kg    Examination:  General exam: not in any kind of  distress.  Respiratory system: Clear to auscultation. Respiratory effort normal. No wheezing or rhonchi.  Cardiovascular system: S1 & S2 heard, RRR. No JVD, murmurs, rubs,  Gastrointestinal system: Abdomen is soft, bowel sounds good.  Central nervous system: Alert and oriented. Grossly non focal Extremities: Symmetric 5 x 5 power. Skin: No rashes, lesions or ulcers Psychiatry: mood okay.     Data Reviewed: I have  personally reviewed following labs and imaging studies  CBC: Recent Labs  Lab 06/25/18 0338  WBC 6.9  NEUTROABS 4.1  HGB 10.1*  HCT 32.1*  MCV 99.1  PLT 448*   Basic Metabolic Panel: Recent Labs  Lab 06/25/18 0338 06/26/18 0901 06/27/18 0405 06/28/18 0423 06/29/18 0245  NA 137 134* 136 137 134*  K 4.0 4.1 4.7 4.1 3.7  CL 103 100 102 102 103  CO2 25 26 26 27 26   GLUCOSE 113* 120* 124* 128* 160*  BUN 12 25* 20 18 18   CREATININE 0.61 0.62 0.57 0.59 0.54  CALCIUM 9.1 9.2 9.0 8.7* 8.5*  MG 2.1 2.1 2.2 2.1 2.1  PHOS 4.2 4.7* 4.7* 4.2 3.4   GFR: Estimated Creatinine Clearance: 117.9 mL/min (by C-G formula based on SCr of 0.54 mg/dL). Liver Function Tests: Recent Labs  Lab 06/24/18 0323 06/25/18 0338 06/28/18 0423 06/29/18 0245  AST 41 35 29 21  ALT 72* 63* 40 31  ALKPHOS 127* 128* 120 121  BILITOT 1.4* 1.3* 1.2 1.0  PROT 6.4* 7.1 6.5 6.3*  ALBUMIN 3.3* 3.5 3.4* 3.3*   No results for input(s): LIPASE, AMYLASE in the last 168 hours. No results for input(s): AMMONIA in the last 168 hours. Coagulation Profile: No results for input(s): INR, PROTIME in the last 168 hours. Cardiac Enzymes: No results for input(s): CKTOTAL, CKMB, CKMBINDEX, TROPONINI in the last 168 hours. BNP (last 3 results) No results for input(s): PROBNP in the last 8760 hours. HbA1C: No results for input(s): HGBA1C in the last 72 hours. CBG: Recent Labs  Lab 06/28/18 1816 06/28/18 2348 06/29/18 0637 06/29/18 1137 06/29/18 1721  GLUCAP 135* 130* 139* 135* 101*   Lipid Profile: No results for input(s): CHOL, HDL, LDLCALC, TRIG, CHOLHDL, LDLDIRECT in the last 72 hours. Thyroid Function Tests: No results for input(s): TSH, T4TOTAL, FREET4, T3FREE, THYROIDAB in the last 72 hours. Anemia Panel: No results for input(s): VITAMINB12, FOLATE, FERRITIN, TIBC, IRON, RETICCTPCT in the last 72 hours. Sepsis Labs: No results for input(s): PROCALCITON, LATICACIDVEN in the last 168 hours.  No results  found for this or any previous visit (from the past 240 hour(s)).       Radiology Studies: No results found.      Scheduled Meds: . bisacodyl  10 mg Rectal Once  . clobetasol cream   Topical BID  . enoxaparin (LOVENOX) injection  40 mg Subcutaneous Q24H  . famotidine  20 mg Oral BID  . fentaNYL  1 patch Transdermal Q72H  . pantoprazole  40 mg Oral BID  . senna-docusate  2 tablet Oral BID  . sucralfate  1 g Oral Q8H  . triamcinolone cream   Topical TID   Continuous Infusions: . dextrose 5 % and 0.45% NaCl 20 mL/hr at 06/29/18 0200     LOS: 13 days    Time spent: 42 minutes.     Hosie Poisson, MD [facility] Pager [phone]  If 7PM-7AM, please contact night-coverage [email or web address] Password [facility] 06/29/2018, 6:34 PM

## 2018-06-29 NOTE — Progress Notes (Signed)
Provided w/pcp listing-[facility]-patient voiced understnaind-she will set own pcp appt. No further CM needs.

## 2018-06-29 NOTE — Progress Notes (Signed)
PHARMACY - ADULT TOTAL PARENTERAL NUTRITION CONSULT NOTE   Pharmacy Consult for TPN Indication: Severe pancreatitis  Patient Measurements: Height: 5\' 11"  (180.3 cm) Weight: 227 lb 9.6 oz (103.2 kg) IBW/kg (Calculated) : 70.8   Body mass index is 31.74 kg/m. Usual Weight: Most recent weight 105 kg on 2/2 Current weight: 102.9 kg  Insulin Requirements: 4 units from sensitive SSI q6h - no hx DM  Current Nutrition: clear liquids  IVF: D5 1/2NS at 20 mL/hr   Central access: Double lumen PICC placed 2/6 TPN start date: 2/7  ASSESSMENT                                                                                              HPI: Pt has PMH significant for 1 month of nausea, decreased appetite, abdominal pain, and weight loss. Underwent ERCP and biliary stent placement on 1/30 with finding of a large ulcerated duodenal mass. Pt presented on 2/1 with post-ERCP pancreatitis. Pt has been unable to tolerate PO intake during admission and has had minimal PO intake for several weeks. Pharmacy consulted to dose TPN.  Significant events:  -1/30: ERCP with biliary stent placement -2/6: PICC placed -2/7: Initiate TPN -2/10: clear liquid diet -2/11: no po intake due to fear it will cause abdominal pain -2/12: upper GI series to rule out duodenal obstruction   Today:   Glucose (goal <150): all wnl except for one that was 160  Electrolytes: Na slightly low at 134, K at lower end of range 3.7 with K content decreased to 25 mq/L in TPN; Phos is within goal range but trending down with Phos removed from TPN for the past couple of days; other lytes wnl  Renal: SCr stable, WNL  LFTs: all improved to WNL  TGs: 81 (2/7) wnl but trending up to 141 (2/10) -- will monitor closely  Prealbumin: 7.2 (2/7), 8.4 (2/10)  NUTRITIONAL GOALS                                                                                 RD recs per note on 2/7: Kcal: 2200 - 2400 kcal Protein: 110 - 120 g Fluid: >/=  2.2 L/day  TPN at goal rate of 92 mL/hr will provide: 2330 kcal, 117 g protein, and 353 g dextrose, meeting 100% of patient needs  PLAN  Per Dr. Karleen Hampshire, to d/c TPN after current bag today. - At [place], will decrease TPN rate by half to 45 mL/hr, run bag at this rate for two hours, then d/c bag at [facility]. - pharmacy will sign off. Re-consult [place] if need further assistance  Dia Sitter, PharmD, BCPS 06/29/2018 7:10 AM

## 2018-06-29 NOTE — Progress Notes (Signed)
Spoke with RN about the IV consult requested for the titration of the TPN. RN stated she can handle the titrations but would need me to disconnect the line when the patient needed to be discharged. RN will place another IV team consult when the patient is ready to discharge

## 2018-06-29 NOTE — Progress Notes (Signed)
   Patient Name: Hailey Houston Date of Encounter: 06/29/2018, 10:34 AM    Subjective  Tolerating oral meds (pain, etc) Full liquid diet going ok She feels ready for dc   Objective  BP 125/76 (BP Location: Left Arm)   Pulse 86   Temp 98.9 F (37.2 C) (Oral)   Resp 16   Ht 5\' 11"  (1.803 m)   Wt 104.1 kg   SpO2 97%   BMI 32.02 kg/m      Assessment and Plan  Adenocarcinoma of duodenum with metastases Post-ERCP pancreatitis   Improved and appears ready for dc Call back if ?  Gatha Mayer, MD, [facility] 06/29/2018 10:34 AM Pager [phone]

## 2018-06-30 ENCOUNTER — Other Ambulatory Visit: Payer: Self-pay | Admitting: Physician Assistant

## 2018-06-30 MED ORDER — SENNOSIDES-DOCUSATE SODIUM 8.6-50 MG PO TABS: 2.0000 | ORAL_TABLET | Freq: Two times a day (BID) | ORAL | 0 refills | Status: DC

## 2018-06-30 MED ORDER — HYDROXYZINE HCL 50 MG PO TABS: 50.0000 mg | ORAL_TABLET | Freq: Three times a day (TID) | ORAL | 0 refills | Status: DC | PRN

## 2018-06-30 MED ORDER — CLOBETASOL PROPIONATE 0.05 % EX CREA: TOPICAL_CREAM | Freq: Two times a day (BID) | CUTANEOUS | 0 refills | Status: DC

## 2018-06-30 MED ORDER — FAMOTIDINE 20 MG PO TABS: 20.0000 mg | ORAL_TABLET | Freq: Two times a day (BID) | ORAL | 0 refills | Status: DC

## 2018-06-30 MED ORDER — HYDROMORPHONE HCL 4 MG PO TABS: 4.0000 mg | ORAL_TABLET | ORAL | 0 refills | Status: DC | PRN

## 2018-06-30 MED ORDER — SUCRALFATE 1 GM/10ML PO SUSP: 1.0000 g | Freq: Three times a day (TID) | ORAL | 0 refills | Status: DC

## 2018-06-30 MED ORDER — LORAZEPAM 0.5 MG PO TABS: 0.5000 mg | ORAL_TABLET | Freq: Four times a day (QID) | ORAL | 0 refills | Status: DC | PRN

## 2018-06-30 MED ORDER — FENTANYL 75 MCG/HR TD PT72: 1.0000 | MEDICATED_PATCH | TRANSDERMAL | 0 refills | Status: DC

## 2018-06-30 MED ORDER — TRIAMCINOLONE ACETONIDE 0.1 % EX CREA: TOPICAL_CREAM | Freq: Three times a day (TID) | CUTANEOUS | 0 refills | Status: DC

## 2018-06-30 MED ORDER — ONDANSETRON HCL 4 MG PO TABS: ORAL_TABLET | ORAL | 0 refills | Status: DC

## 2018-06-30 MED ORDER — DIPHENHYDRAMINE HCL 50 MG PO CAPS: 50.0000 mg | ORAL_CAPSULE | Freq: Four times a day (QID) | ORAL | 0 refills | Status: DC | PRN

## 2018-06-30 MED ORDER — HEPARIN SOD (PORK) LOCK FLUSH 100 UNIT/ML IV SOLN
250.0000 [IU] | INTRAVENOUS | Status: AC | PRN
  Administered 2018-06-30: 250 [IU]

## 2018-06-30 MED ORDER — PANTOPRAZOLE SODIUM 40 MG PO TBEC: 40.0000 mg | DELAYED_RELEASE_TABLET | Freq: Two times a day (BID) | ORAL | 0 refills | Status: DC

## 2018-06-30 NOTE — Care Management Note (Signed)
Case Management Note  Patient Details  Name: Hailey Houston MRN: [id number] Date of Birth: 22-Apr-1971  Subjective/Objective:    Adenocarcinoma of duodenum,  Pancreatitis                Action/Plan: NCM spoke to pt and offered choice for HH/Medicare list provided and placed on chart. Pt agreeable to [facility] for HH. Contacted [facility] with new referral.   Expected Discharge Date:  06/30/18               Expected Discharge Plan:  [place]  In-House Referral:  NA  Discharge planning Services  CM Consult  Post Acute Care Choice:  Home Health Choice offered to:  Patient  DME Arranged:  N/A DME Agency:  NA  HH Arranged:  RN [place] Agency:  [place]  Status of Service:  Completed, signed off  If discussed at [place] of Stay Meetings, dates discussed:    Additional Comments:  Erenest Rasher, RN 06/30/2018, 1:58 PM

## 2018-06-30 NOTE — Progress Notes (Signed)
Pt discharged home as ordered. RUE double lumen PICC intact and Hep locked by IVT prior to D/C. Extension tubing applied to red port by IVT- pt instructed on how and when to flush this port by herself. IVT stated flushing only red port is sufficient. Pt verbalized understanding and confidence in doing so. Extra supplies including 35ml NS flushes and chlorhexidine cleaning wipes sent with patient. Elastic sleeve applied over PICC also to cover extension tubing. Pt aware not to shower with PICC. Pt assisted to exit via W/C with assist from RN. All belongings sent with pt. Medications ready for pickup at pharmacy.

## 2018-07-01 ENCOUNTER — Other Ambulatory Visit: Payer: Self-pay | Admitting: Oncology

## 2018-07-01 NOTE — Discharge Summary (Signed)
Physician Discharge Summary  Hailey Houston TML:[id number] DOB: September 03, 1970 DOA: 06/16/2018  PCP: Patient, No Pcp Per  Admit date: 06/16/2018 Discharge date: 06/30/2018  Admitted From: Home  Disposition:  Home.   Recommendations for Outpatient Follow-up:  1. Follow up with PCP in 1-2 weeks 2. Please obtain BMP/CBC in one week Please follow up with oncology as recommended.    Home Health:yes   Discharge Condition: stable.  CODE STATUS:full code.  Diet recommendation: soft diet.   Brief/Interim Summary: 48 y.o.femalewith medical history significant of ADHD who presented 2/1 with post-ERCP pancreatitis. ERCP was on 1/30 in investigation of 1 month of nausea, decreased appetite, abdominal pain and weight loss with elevated LFTs and pancreatic head mass with intr- and extra-hepatic ductal dilatation.There was some concern for metastatic spread with pulmonary nodules and porta hepatis gastrohepatic area and retroperitoneal lymphadenopathy. ERCP found severely abnormal duodenum causing a distal CBD stricture she had a limited biliary sphincterectomy and balloon dilatation. Biopsies were obtained, and biliary stent placed.she was found to have adenocarcinoma of the duodenum and is scheduled for chemo on Tuesday.   Discharge Diagnoses:  Active Problems:   Adenocarcinoma of duodenum (HCC)   Pancreatitis   Hypokalemia   Dehydration   Nausea and vomiting   Acute pancreatitis   Pulmonary nodules   Malnutrition of moderate degree   Epigastric pain   Goals of care, counseling/discussion   Metastatic adenocarcinoma of the duodenum With LN mets.  Oncology Dr. Learta Codding consulted plan to initiate FOLFOX chemo on tuesday.  Symptomatic management with pain control, anti emetics.     Post ERCP pancreatitis Pain control with fentanyl patch 75 MCG started on 06/27/2018, initially planned to wean her fentanyl, but patient would like to continue the same dose, as her pain is better controlled  with 75 mcg. In addition to fentanyl she is also on  oral Dilaudid up to 8 mg for breakthrough pain.  PICC line was placed and she was started on TPN for poor nutrition.  Patient is currently on clear liquid diet , advanced diet as tolerated to soft diet. Discontinued TPN on discharge.    Hypokalemia Replaced   Dehydration Resolved.    Persistent itching Differential includes paraneoplastic versus allergic dermatitis versus possibly from Dilaudid. Oral Benadryl and Vistaril has been added and its controlled.    Constipation Suppositories ordered.   Poor oral nutrition Possibly complicated by narrow duodenal lumen.  GI recommending upper GI series to rule out duodenal obstruction. UGI series ruled out obstruction. Recommend soft diet for now and on discharge.   Discharge Instructions  Discharge Instructions    Diet - low sodium heart healthy   Complete by:  As directed    Increase activity slowly   Complete by:  As directed      Allergies as of 06/30/2018      Reactions   Oxycodone Itching      Medication List    STOP taking these medications   ALPRAZolam 0.5 MG tablet Commonly known as:  XANAX   anti-nausea solution   HYDROcodone-acetaminophen 5-325 MG tablet Commonly known as:  NORCO/VICODIN   ibuprofen 200 MG tablet Commonly known as:  ADVIL,MOTRIN   ketorolac 10 MG tablet Commonly known as:  TORADOL   levonorgestrel 20 MCG/24HR IUD Commonly known as:  MIRENA   magic mouthwash Soln   nystatin-triamcinolone ointment Commonly known as:  MYCOLOG     TAKE these medications   amphetamine-dextroamphetamine 30 MG tablet Commonly known as:  ADDERALL Take 30 mg  by mouth 2 (two) times daily.   clobetasol cream 0.05 % Commonly known as:  TEMOVATE Apply topically 2 (two) times daily.   diphenhydrAMINE 50 MG capsule Commonly known as:  BENADRYL Take 1 capsule (50 mg total) by mouth every 6 (six) hours as needed for itching.   famotidine 20  MG tablet Commonly known as:  PEPCID Take 1 tablet (20 mg total) by mouth 2 (two) times daily.   fentaNYL 75 MCG/HR Commonly known as:  [place] 1 patch onto the skin every 3 (three) days. Start taking on:  July 02, 2018 Notes to patient:  CHANGE EVERY 3 DAYS   HYDROmorphone 4 MG tablet Commonly known as:  DILAUDID Take 1-2 tablets (4-8 mg total) by mouth every 4 (four) hours as needed for up to 3 days for severe pain.   hydrOXYzine 50 MG tablet Commonly known as:  ATARAX/VISTARIL Take 1 tablet (50 mg total) by mouth 3 (three) times daily as needed for itching. What changed:    medication strength  how much to take  how to take this  when to take this  reasons to take this  additional instructions   LORazepam 0.5 MG tablet Commonly known as:  ATIVAN Take 1 tablet (0.5 mg total) by mouth every 6 (six) hours as needed for anxiety.   ondansetron 4 MG tablet Commonly known as:  ZOFRAN Take 1 tablet every 6 hours as needed for nausea. What changed:    how much to take  how to take this  when to take this  reasons to take this  additional instructions   pantoprazole 40 MG tablet Commonly known as:  PROTONIX Take 1 tablet (40 mg total) by mouth 2 (two) times daily.   senna-docusate 8.6-50 MG tablet Commonly known as:  Senokot-S Take 2 tablets by mouth 2 (two) times daily.   sucralfate 1 GM/10ML suspension Commonly known as:  CARAFATE Take 10 mLs (1 g total) by mouth every 8 (eight) hours.   triamcinolone cream 0.1 % Commonly known as:  KENALOG Apply topically 3 (three) times daily.      Follow-up Information    Ladell Pier, MD Follow up on 07/02/2018.   Specialty:  Oncology Why:  as recommended.  Contact information: [place] [id number] [phone]        [facility] Follow up.   Specialty:  [place] Why:  Home Health RN-agency will call to arrange appointment Contact  information: [place] [id number] [phone]          Allergies  Allergen Reactions  . Oxycodone Itching    Consultations:  Gastroenterology  Oncology.    Procedures/Studies: Dg Chest 2 View  Result Date: 06/16/2018 CLINICAL DATA:  Pancreatic mass and colon cancer EXAM: CHEST - 2 VIEW COMPARISON:  None. FINDINGS: AP and lateral views of the chest were obtained. The lungs are clear without focal pneumonia, edema, pneumothorax or pleural effusion. The cardiopericardial silhouette is within normal limits for size. Scattered bilateral pulmonary nodules are evident. The visualized bony structures of the thorax are intact. Telemetry leads overlie the chest. IMPRESSION: 1. Bilateral pulmonary nodules highly suspicious for metastatic disease. 2. No edema or pleural effusion. Electronically Signed   By: Misty Stanley M.D.   On: 06/16/2018 20:45   Ct Chest W Contrast  Result Date: 06/20/2018 CLINICAL DATA:  Malignant biliary obstruction status post ERCP and common bile duct stent placement 06/14/2018. Large ulcerated duodenal mass  was identified. Evaluate for possible lung nodules. EXAM: CT CHEST WITH CONTRAST TECHNIQUE: Multidetector CT imaging of the chest was performed during intravenous contrast administration. CONTRAST:  30mL OMNIPAQUE IOHEXOL 300 MG/ML  SOLN COMPARISON:  CT abdomen 06/16/2018 FINDINGS: Cardiovascular: The heart is normal in size. No pericardial effusion. The aorta is normal in caliber. No dissection. The branch vessels are patent. No definite coronary artery calcifications. Mediastinum/Nodes: 9.5 mm right subclavicular lymph node adjacent to the left thyroid lobe is suspicious for malignant adenopathy. Slightly more inferiorly is an 8 mm node between the left carotid artery and the left subclavian artery. Mediastinal lymphadenopathy, worrisome for metastatic disease. 10 mm right hilar node on image number 61. 10 mm subcarinal lymph node on image number  62. 7 mm right paratracheal node on image number 49. The esophagus is grossly normal. Lungs/Pleura: Numerous bilateral pulmonary nodules consistent with metastatic disease. Some of these demonstrate mild cavitation. Index node in the right upper lobe on image number 64 measures 9.5 mm. Index node in the left lower lobe on image number 120 measures 8 mm. 7 mm right lower lobe on image number 75. No acute pulmonary findings.  No pleural effusion. Upper Abdomen: There is a common bile duct stent in place. There is New significant inflammatory change involving the pancreas and peripancreatic tissues suggesting acute pancreatitis. Is also inflammation and fluid extending into the periportal region and into the anterior pararenal fossa bilaterally. High attenuation lesions in the gallbladder, likely gallstones. Pneumobilia associated with the common bile duct stent. Musculoskeletal: No breast masses, chest wall mass or axillary adenopathy. IMPRESSION: 1. Diffuse metastatic pulmonary nodules in both lungs. 2. Subclavicular, mediastinal and hilar lymph nodes suspicious for metastatic disease. 3. Acute pancreatitis. Poor enhancement of the pancreas could suggest pancreatic necrosis. These results will be called to the ordering clinician or representative by the Radiologist Assistant, and communication documented in the PACS or zVision Dashboard. Electronically Signed   By: Marijo Sanes M.D.   On: 06/20/2018 17:59   Ct Abdomen Pelvis W Contrast  Result Date: 06/16/2018 CLINICAL DATA:  Per patient ECG Thursday recent diagnosis pancreatic mass and colon caMid-Severe lower abdomen pain nauseaNo prior surgeriesPain since procedure, no fever. EXAM: CT ABDOMEN AND PELVIS WITH CONTRAST TECHNIQUE: Multidetector CT imaging of the abdomen and pelvis was performed using the standard protocol following bolus administration of intravenous contrast. CONTRAST:  146mL ISOVUE-300 IOPAMIDOL (ISOVUE-300) INJECTION 61% COMPARISON:  None.  FINDINGS: Lower chest: Multiple lung base nodules consistent with metastatic disease. Two largest on the left, 6 mm nodule, subpleural lateral left lower lobe, image 15, series 4 and 7 mm nodule, posteromedial left lower lobe, image 27. Largest on the right, 6 mm posterior inferior lower lobe nodule, image 23. No acute findings at the lung bases. Hepatobiliary: Liver normal in size and attenuation. No masses or focal lesions. There is intrahepatic biliary air. Gallbladder is mildly distended. It contains dense fluid had at least 1 discrete stone. No wall thickening or adjacent inflammation. There is air within the common bile duct. A distal common bile duct stent extends across the pancreatic head to the second portion of the duodenum. Pancreas: There is fullness of the pancreatic head without a defined mass. Pancreatic duct is dilated to a maximum of 5 mm. There is hazy inflammatory change in the peripancreatic fat tracking along the gastrohepatic ligament. Multiple peripancreatic subcentimeter lymph nodes are noted. Spleen: Normal in size without focal abnormality. Adrenals/Urinary Tract: No adrenal masses. No renal masses. 3-4 mm nonobstructing stone  in the upper pole the right kidney. No other intrarenal stones. No hydronephrosis. Ureters are normal in course and in caliber. Bladder is unremarkable. Stomach/Bowel: Mild wall thickening of the duodenum adjacent to the pancreatic head, presumed reactive. Stomach is unremarkable. Small bowel is normal in caliber with no wall thickening or inflammation. Colon is normal in caliber with no wall thickening or inflammation. No CT evidence of a colonic mass. Normal appendix visualized. Vascular/Lymphatic: There are prominent retroperitoneal lymph nodes all subcentimeter short axis. Portal vein, superior mesenteric vein and splenic vein are patent. No aortic atherosclerosis. Reproductive: Well-positioned IUD. Uterus and adnexa otherwise unremarkable. Other: No abdominal  wall hernia.  No ascites. Musculoskeletal: No fracture or acute finding. No osteoblastic or osteolytic lesions. Disc degenerative changes at L2-L3. IMPRESSION: 1. There are peripancreatic inflammatory changes consistent with pancreatitis. No discrete fluid collection is seen to suggest a pseudocyst. There is no venous thrombosis and no evidence of pancreatic necrosis. No other evidence of an acute abnormality. 2. Fullness of the pancreatic head. No defined mass. Pancreatic duct is dilated to 5 mm. 3. Multiple subcentimeter peripancreatic lymph nodes. Given the history of pancreatic carcinoma this is suspicious for metastatic adenopathy. 4. Well-positioned distal common bile duct stent. There is intrahepatic biliary and common bile duct air. 5. No visualized colonic mass. 6. Multiple lung base nodules consistent with metastatic disease. 7. Nonobstructing stone in the upper pole of the right kidney. Electronically Signed   By: Lajean Manes M.D.   On: 06/16/2018 17:13   Dg Ercp Biliary & Pancreatic Ducts  Result Date: 06/14/2018 CLINICAL DATA:  CBD stent placement EXAM: ERCP TECHNIQUE: Multiple spot images obtained with the fluoroscopic device and submitted for interpretation post-procedure. FLUOROSCOPY TIME:  Fluoroscopy Time:  3 minutes and 28 seconds Radiation Exposure Index (if provided by the fluoroscopic device): Number of Acquired Spot Images: 0 COMPARISON:  None. FINDINGS: Imaging demonstrates cannulation of the common bile duct and contrast filling the biliary tree. There is narrowing of the common bile duct through the pancreas. Subsequent images demonstrate biliary duct balloon dilatation. The final image demonstrates a metallic stent across the area of narrowing from the dilated common bile duct into the duodenum. IMPRESSION: Successful common bile duct stent placement as described. These images were submitted for radiologic interpretation only. Please see the procedural report for the amount of  contrast and the fluoroscopy time utilized. Electronically Signed   By: Marybelle Killings M.D.   On: 06/14/2018 13:50   Dg Abd 2 Views  Result Date: 06/25/2018 CLINICAL DATA:  Epigastric abdominal pain. EXAM: ABDOMEN - 2 VIEW COMPARISON:  CT 60/73/7106 FINDINGS: Metallic biliary stent is noted in place. There is pneumobilia. Nonobstructive bowel gas pattern. Moderate stool in the colon. No free air. IUD noted in the pelvis. Multiple calcified phleboliths in the pelvis. IMPRESSION: No evidence of bowel obstruction or free air. Biliary stent and pneumobilia noted. Electronically Signed   By: Rolm Baptise M.D.   On: 06/25/2018 19:25   Dg Duanne [place] W Single Cm (sol Or Thin Ba)  Result Date: 06/27/2018 CLINICAL DATA:  Duodenal adenocarcinoma. Evaluate for gastric outlet obstruction. EXAM: UPPER GI SERIES WITHOUT KUB TECHNIQUE: Routine upper GI series was performed with thin barium. FLUOROSCOPY TIME:  Fluoroscopy Time:  5 minutes and 12 seconds Radiation Exposure Index (if provided by the fluoroscopic device): 197 mGy Number of Acquired Spot Images: 0 COMPARISON:  Acute abdomen series of 06/25/2018. ERCP/Endoscopic ultrasound report of 06/14/2018 FINDINGS: Single-contrast, focused upper GI was performed. No esophageal stricture  identified. Normal caliber of the stomach, without retained gastric contents. Prompt passage of contrast from the stomach into the duodenal bulb. Anatomy is mildly distorted by biliary stent in place with resultant reflux of contrast into the biliary tree. Best visualized on image 1/5 is moderate narrowing of the proximal duodenum, over approximately 3 cm span. No gastric outlet obstruction. Normal caliber proximal jejunal loops. IMPRESSION: Moderate proximal duodenal narrowing, without evidence of gastric obstruction to contrast passage Electronically Signed   By: Abigail Miyamoto M.D.   On: 06/27/2018 14:08   [place] Core Biopsy (lymph Nodes)  Result Date: 06/22/2018 INDICATION: 48 year old female with a  history of duodenal adenocarcinoma and suspicious left supraclavicular lymphadenopathy. Of note, she does have a brother who was recently diagnosed with sarcoidosis. She presents for ultrasound-guided core biopsy to confirm tissue diagnosis. EXAM: Ultrasound-guided core biopsy of left supraclavicular lymph node. MEDICATIONS: None. ANESTHESIA/SEDATION: None FLUOROSCOPY TIME:  None COMPLICATIONS: None immediate. PROCEDURE: Informed written consent was obtained from the patient after a thorough discussion of the procedural risks, benefits and alternatives. All questions were addressed. A timeout was performed prior to the initiation of the procedure. Ultrasound was used to interrogate the left supraclavicular fossa. A 1.2 x 0.9 cm hypoechoic lymph node is evident posterior to the jugular vein and adjacent to the carotid artery. A suitable window could be identified. The overlying skin was sterilely prepped and draped in the standard sterile fashion using chlorhexidine skin prep. Local anesthesia was attained by infiltration with 1% lidocaine. A small dermatotomy was made. Under real time sonographic guidance, multiple 18 gauge core biopsies were obtained using the bio Pince automated biopsy device. The biopsy specimens were placed in saline and sent to pathology for further analysis. Post biopsy ultrasound imaging demonstrates no evidence of immediate complication. IMPRESSION: Technically successful ultrasound-guided core biopsy of left supraclavicular lymph node. Electronically Signed   By: Jacqulynn Cadet M.D.   On: 06/22/2018 17:44   [place] Ekg Site Rite  Result Date: 06/21/2018 If Site Rite image not attached, placement could not be confirmed due to current cardiac rhythm.  Ct Outside Films Spine  Result Date: 06/18/2018 This examination belongs to an outside facility and is stored here for comparison purposes only.  Contact the originating outside institution for any associated report or interpretation.  Ct  Outside Films Body  Result Date: 06/18/2018 This examination belongs to an outside facility and is stored here for comparison purposes only.  Contact the originating outside institution for any associated report or interpretation.      Subjective: No new complaints.   Discharge Exam: Vitals:   06/29/18 2031 06/30/18 0433  BP: 129/81 118/71  Pulse: 95 (!) 101  Resp: 16 18  Temp: 98.2 F (36.8 C) 98.6 F (37 C)  SpO2: 96% 95%   Vitals:   06/29/18 0638 06/29/18 1254 06/29/18 2031 06/30/18 0433  BP: 125/76 132/79 129/81 118/71  Pulse: 86 89 95 (!) 101  Resp: 16 14 16 18   Temp: 98.9 F (37.2 C) 98.3 F (36.8 C) 98.2 F (36.8 C) 98.6 F (37 C)  TempSrc: Oral Oral Oral Oral  SpO2: 97% 99% 96% 95%  Weight: 104.1 kg   102.9 kg  Height:        General: Pt is alert, awake, not in acute distress Cardiovascular: RRR, S1/S2 +, no rubs, no gallops Respiratory: CTA bilaterally, no wheezing, no rhonchi Abdominal: Soft, NT, ND, bowel sounds + Extremities: no edema, no cyanosis    The results of significant diagnostics  from this hospitalization (including imaging, microbiology, ancillary and laboratory) are listed below for reference.     Microbiology: No results found for this or any previous visit (from the past 240 hour(s)).   Labs: BNP (last 3 results) No results for input(s): BNP in the last 8760 hours. Basic Metabolic Panel: Recent Labs  Lab 06/25/18 0338 06/26/18 0901 06/27/18 0405 06/28/18 0423 06/29/18 0245  NA 137 134* 136 137 134*  K 4.0 4.1 4.7 4.1 3.7  CL 103 100 102 102 103  CO2 25 26 26 27 26   GLUCOSE 113* 120* 124* 128* 160*  BUN 12 25* 20 18 18   CREATININE 0.61 0.62 0.57 0.59 0.54  CALCIUM 9.1 9.2 9.0 8.7* 8.5*  MG 2.1 2.1 2.2 2.1 2.1  PHOS 4.2 4.7* 4.7* 4.2 3.4   Liver Function Tests: Recent Labs  Lab 06/25/18 0338 06/28/18 0423 06/29/18 0245  AST 35 29 21  ALT 63* 40 31  ALKPHOS 128* 120 121  BILITOT 1.3* 1.2 1.0  PROT 7.1 6.5 6.3*   ALBUMIN 3.5 3.4* 3.3*   No results for input(s): LIPASE, AMYLASE in the last 168 hours. No results for input(s): AMMONIA in the last 168 hours. CBC: Recent Labs  Lab 06/25/18 0338  WBC 6.9  NEUTROABS 4.1  HGB 10.1*  HCT 32.1*  MCV 99.1  PLT 481*   Cardiac Enzymes: No results for input(s): CKTOTAL, CKMB, CKMBINDEX, TROPONINI in the last 168 hours. BNP: Invalid input(s): POCBNP CBG: Recent Labs  Lab 06/28/18 1816 06/28/18 2348 06/29/18 0637 06/29/18 1137 06/29/18 1721  GLUCAP 135* 130* 139* 135* 101*   D-Dimer No results for input(s): DDIMER in the last 72 hours. Hgb A1c No results for input(s): HGBA1C in the last 72 hours. Lipid Profile No results for input(s): CHOL, HDL, LDLCALC, TRIG, CHOLHDL, LDLDIRECT in the last 72 hours. Thyroid function studies No results for input(s): TSH, T4TOTAL, T3FREE, THYROIDAB in the last 72 hours.  Invalid input(s): FREET3 Anemia work up No results for input(s): VITAMINB12, FOLATE, FERRITIN, TIBC, IRON, RETICCTPCT in the last 72 hours. Urinalysis No results found for: COLORURINE, APPEARANCEUR, LABSPEC, [place], GLUCOSEU, HGBUR, BILIRUBINUR, KETONESUR, PROTEINUR, UROBILINOGEN, NITRITE, LEUKOCYTESUR Sepsis Labs Invalid input(s): PROCALCITONIN,  WBC,  LACTICIDVEN Microbiology No results found for this or any previous visit (from the past 240 hour(s)).   Time coordinating discharge: 36  minutes  SIGNED:   Hosie Poisson, MD  [facility] 07/01/2018, 5:28 PM Pager   If 7PM-7AM, please contact night-coverage [email or web address] Password [email or web address]

## 2018-07-02 ENCOUNTER — Inpatient Hospital Stay: Payer: 59

## 2018-07-02 ENCOUNTER — Inpatient Hospital Stay (HOSPITAL_BASED_OUTPATIENT_CLINIC_OR_DEPARTMENT_OTHER): Payer: 59 | Admitting: Oncology

## 2018-07-02 ENCOUNTER — Other Ambulatory Visit: Payer: Self-pay | Admitting: *Deleted

## 2018-07-02 VITALS — BP 118/64 | HR 100 | Temp 99.2°F | Resp 18 | Ht 71.0 in | Wt 226.3 lb

## 2018-07-02 DIAGNOSIS — C170 Malignant neoplasm of duodenum: Secondary | ICD-10-CM | POA: Diagnosis not present

## 2018-07-02 DIAGNOSIS — D649 Anemia, unspecified: Secondary | ICD-10-CM

## 2018-07-02 DIAGNOSIS — K831 Obstruction of bile duct: Secondary | ICD-10-CM

## 2018-07-02 DIAGNOSIS — K859 Acute pancreatitis without necrosis or infection, unspecified: Secondary | ICD-10-CM | POA: Diagnosis not present

## 2018-07-02 DIAGNOSIS — Z79899 Other long term (current) drug therapy: Secondary | ICD-10-CM

## 2018-07-02 DIAGNOSIS — K858 Other acute pancreatitis without necrosis or infection: Secondary | ICD-10-CM

## 2018-07-02 DIAGNOSIS — Z5111 Encounter for antineoplastic chemotherapy: Secondary | ICD-10-CM | POA: Diagnosis not present

## 2018-07-02 MED ORDER — ONDANSETRON HCL 8 MG PO TABS: ORAL_TABLET | ORAL | 1 refills | Status: DC

## 2018-07-02 MED ORDER — FENTANYL 75 MCG/HR TD PT72: 1.0000 | MEDICATED_PATCH | TRANSDERMAL | 0 refills | Status: DC

## 2018-07-02 MED ORDER — HYDROMORPHONE HCL 4 MG PO TABS: 4.0000 mg | ORAL_TABLET | ORAL | 0 refills | Status: DC | PRN

## 2018-07-02 MED ORDER — SODIUM CHLORIDE 0.9% FLUSH
10.0000 mL | INTRAVENOUS | Status: AC | PRN
  Administered 2018-07-02: 10 mL via INTRAVENOUS
  Filled 2018-07-02: qty 10

## 2018-07-02 MED ORDER — HEPARIN SOD (PORK) LOCK FLUSH 100 UNIT/ML IV SOLN
500.0000 [IU] | Freq: Once | INTRAVENOUS | Status: AC
  Administered 2018-07-02: 250 [IU] via INTRAVENOUS
  Filled 2018-07-02: qty 5

## 2018-07-02 MED ORDER — SODIUM CHLORIDE 0.9% FLUSH
10.0000 mL | INTRAVENOUS | Status: DC | PRN
  Filled 2018-07-02: qty 10

## 2018-07-02 MED ORDER — LORAZEPAM 0.5 MG PO TABS: 0.5000 mg | ORAL_TABLET | Freq: Four times a day (QID) | ORAL | 1 refills | Status: DC | PRN

## 2018-07-02 MED ORDER — PROCHLORPERAZINE MALEATE 10 MG PO TABS: 10.0000 mg | ORAL_TABLET | Freq: Four times a day (QID) | ORAL | 1 refills | Status: DC | PRN

## 2018-07-02 MED ORDER — HEPARIN SOD (PORK) LOCK FLUSH 100 UNIT/ML IV SOLN
500.0000 [IU] | Freq: Once | INTRAVENOUS | Status: DC
  Filled 2018-07-02: qty 5

## 2018-07-02 NOTE — Addendum Note (Signed)
Addended by: Tania Ade on: 07/02/2018 05:22 PM   Modules accepted: Orders, SmartSet

## 2018-07-02 NOTE — Progress Notes (Signed)
[facility] OFFICE PROGRESS NOTE   Diagnosis: Small bowel carcinoma  INTERVAL HISTORY:   Hailey Houston was discharged from the hospital 06/30/2018.  She reports adequate pain control.  She continues a Duragesic patch.  She is taking Dilaudid for breakthrough pain.  She takes the Dilaudid approximately every 6 hours.  She is tolerating a diet.  The rash has improved. She reports constipation.  Objective:  Vital signs in last 24 hours:  Blood pressure 118/64, pulse 100, temperature 99.2 F (37.3 C), temperature source Oral, resp. rate 18, height 5\' 11"  (1.803 m), weight 226 lb 4.8 oz (102.6 kg), SpO2 96 %.   Resp: Lungs clear bilaterally Cardio: Regular rate and rhythm GI: Tender in the mid and right upper abdomen, no mass, no hepatomegaly Vascular: No leg edema  Skin: Resolving rash over the trunk  Portacath/PICC-without erythema  Lab Results:  Lab Results  Component Value Date   WBC 6.9 06/25/2018   HGB 10.1 (L) 06/25/2018   HCT 32.1 (L) 06/25/2018   MCV 99.1 06/25/2018   PLT 481 (H) 06/25/2018   NEUTROABS 4.1 06/25/2018    CMP  Lab Results  Component Value Date   NA 134 (L) 06/29/2018   K 3.7 06/29/2018   CL 103 06/29/2018   CO2 26 06/29/2018   GLUCOSE 160 (H) 06/29/2018   BUN 18 06/29/2018   CREATININE 0.54 06/29/2018   CALCIUM 8.5 (L) 06/29/2018   PROT 6.3 (L) 06/29/2018   ALBUMIN 3.3 (L) 06/29/2018   AST 21 06/29/2018   ALT 31 06/29/2018   ALKPHOS 121 06/29/2018   BILITOT 1.0 06/29/2018   GFRNONAA >60 06/29/2018   GFRAA >60 06/29/2018    Lab Results  Component Value Date   CEA1 10.9 (H) 06/26/2018     Medications: I have reviewed the patient's current medications.   Assessment/Plan: 1.Adenocarcinoma of the duodenum  CT abdomen/pelvis 06/16/2018- changes of pancreatitis, fullness at the head of the pancreas without a defined mass, multiple subcentimeter peripancreatic lymph nodes, distal common bile duct stent, multiple nodules at  the lung bases  EUS 06/14/2018-ulcerated mucosa/mass at the distal duodenal bulb with extension to the pancreas head, portal adenopathy, biopsy of duodenal mass-adenocarcinoma  ERCP 06/14/2018- distal duodenal bulb mass, 1 cm proximal to the ampulla causing biliary obstruction, status post placement of a metal common bile duct stent  CT chest 06/20/2018- bilateral pulmonary nodules consistent with metastatic disease, supraclavicular, mediastinal, and hilar lymphadenopathy, changes of pancreatitis  Ultrasound-guided biopsy of a left supraclavicular lymph node 06/22/2018-metastatic adenocarcinoma  Cycle 1 FOLFOX 07/03/2018  2.Biliary obstruction secondary to the duodenal mass, status post placement of a bile duct stent 06/14/2018 3.Post ERCP pancreatitis 06/16/2018 4.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  Abdominal pain- secondary to the small bowel carcinoma versus pancreatitis  9.  Rash-improved    Disposition: Hailey Houston has an improved performance status since discharge from the hospital.  Her pain is under better control and she is now taking a diet by mouth.  The skin rash has also improved.  She will begin a bowel regimen for constipation.  The plan is to continue the Duragesic and Dilaudid for pain.  She will complete cycle 1 FOLFOX tomorrow.  Hailey Houston be referred for placement of a Port-A-Cath.  She continues PICC care at the Cancer center until the Port-A-Cath is in place.  She  will return for an office visit and cycle 2 FOLFOX in 2 weeks.  I will follow-up on the status of Foundation 1 testing.  Betsy Coder, MD  07/02/2018  9:27 AM

## 2018-07-03 ENCOUNTER — Encounter: Payer: Self-pay | Admitting: *Deleted

## 2018-07-03 ENCOUNTER — Other Ambulatory Visit: Payer: Self-pay | Admitting: *Deleted

## 2018-07-03 ENCOUNTER — Other Ambulatory Visit: Payer: 59

## 2018-07-03 ENCOUNTER — Encounter: Payer: Self-pay | Admitting: Oncology

## 2018-07-03 ENCOUNTER — Inpatient Hospital Stay: Payer: 59

## 2018-07-03 VITALS — BP 133/85 | HR 100 | Temp 99.0°F | Resp 18 | Wt 226.8 lb

## 2018-07-03 DIAGNOSIS — C170 Malignant neoplasm of duodenum: Secondary | ICD-10-CM

## 2018-07-03 MED ORDER — SODIUM CHLORIDE 0.9 % IV SOLN
2400.0000 mg/m2 | INTRAVENOUS | Status: DC
  Administered 2018-07-03: 5450 mg via INTRAVENOUS
  Filled 2018-07-03: qty 109

## 2018-07-03 MED ORDER — SODIUM CHLORIDE 0.9 % IV SOLN: 10.0000 mg | Freq: Once | INTRAVENOUS | Status: DC

## 2018-07-03 MED ORDER — OXALIPLATIN CHEMO INJECTION 100 MG/20ML
88.0000 mg/m2 | Freq: Once | INTRAVENOUS | Status: AC
  Administered 2018-07-03: 200 mg via INTRAVENOUS
  Filled 2018-07-03: qty 40

## 2018-07-03 MED ORDER — LEUCOVORIN CALCIUM INJECTION 350 MG
400.0000 mg/m2 | Freq: Once | INTRAVENOUS | Status: AC
  Administered 2018-07-03: 908 mg via INTRAVENOUS
  Filled 2018-07-03: qty 45.4

## 2018-07-03 MED ORDER — PALONOSETRON HCL INJECTION 0.25 MG/5ML
INTRAVENOUS | Status: AC
  Filled 2018-07-03: qty 5

## 2018-07-03 MED ORDER — PALONOSETRON HCL INJECTION 0.25 MG/5ML
0.2500 mg | Freq: Once | INTRAVENOUS | Status: AC
  Administered 2018-07-03: 0.25 mg via INTRAVENOUS

## 2018-07-03 MED ORDER — DEXAMETHASONE SODIUM PHOSPHATE 10 MG/ML IJ SOLN
10.0000 mg | Freq: Once | INTRAMUSCULAR | Status: AC
  Administered 2018-07-03: 10 mg via INTRAVENOUS

## 2018-07-03 MED ORDER — FLUOROURACIL CHEMO INJECTION 2.5 GM/50ML
400.0000 mg/m2 | Freq: Once | INTRAVENOUS | Status: AC
  Administered 2018-07-03: 900 mg via INTRAVENOUS
  Filled 2018-07-03: qty 18

## 2018-07-03 MED ORDER — DEXTROSE 5 % IV SOLN
Freq: Once | INTRAVENOUS | Status: AC
  Administered 2018-07-03: 14:00:00 via INTRAVENOUS
  Filled 2018-07-03: qty 250

## 2018-07-03 MED ORDER — DEXAMETHASONE SODIUM PHOSPHATE 10 MG/ML IJ SOLN
INTRAMUSCULAR | Status: AC
  Filled 2018-07-03: qty 1

## 2018-07-03 NOTE — Progress Notes (Signed)
[facility] Work  Holiday representative received referral from Therapist, sports for emotional support and adjustment to diagnosis.  CSW met with patient in the infusion room to offer support and assess for needs.  Patient has previously met with CSW team and is scheduled to meet with CSW on 2/20.  CSW and patient reviewed support services at [facility] and in the community.  Patient expressed concerns for her 2 sons and identifying support.   CSW and patient discussed [facility] and the services they provide.  Patient plans to contact [facility] to further discuss services.  CSW provided contact information and encouraged patient to call if she has questions or concerns prior to appointment with CSW on 2/20.      Johnnye Lana, MSW, LCSW, OSW-C Clinical Social Worker [facility] [phone]

## 2018-07-03 NOTE — Patient Instructions (Signed)
[facility] Discharge Instructions for Patients Receiving Chemotherapy  Today you received the following chemotherapy agents: Oxaliplatin (Eloxatin), Leucovorin, Fluorouracil (Adrucil, 5-FU)  To help prevent nausea and vomiting after your treatment, we encourage you to take your nausea medication as directed.    If you develop nausea and vomiting that is not controlled by your nausea medication, call the clinic.   BELOW ARE SYMPTOMS THAT SHOULD BE REPORTED IMMEDIATELY:  *FEVER GREATER THAN 100.5 F  *CHILLS WITH OR WITHOUT FEVER  NAUSEA AND VOMITING THAT IS NOT CONTROLLED WITH YOUR NAUSEA MEDICATION  *UNUSUAL SHORTNESS OF BREATH  *UNUSUAL BRUISING OR BLEEDING  TENDERNESS IN MOUTH AND THROAT WITH OR WITHOUT PRESENCE OF ULCERS  *URINARY PROBLEMS  *BOWEL PROBLEMS  UNUSUAL RASH Items with * indicate a potential emergency and should be followed up as soon as possible.  Feel free to call the clinic should you have any questions or concerns. The clinic phone number is [phone].  Please show the [facility] at check-in to the Emergency Department and triage nurse.  [facility] Discharge Instructions for Patients receiving Home Portable Chemo Pump   **The bag should finish at 46 hours, 96 hours or 7 days. For example, if your pump is scheduled for 46 hours and it was put on at 4pm, it should finish at 2 pm the day it is scheduled to come off regardless of your appointment time.    Estimated time to finish   _________________________ (Have your nurse fill in)     ** if the display on your pump reads "Low Volume" and it is beeping, take the batteries out of the pump and come to the cancer center for it to be taken off.   **If the pump alarms go off prior to the pump reading "Low Volume" then call the [phone] and someone can assist you.  **If the plunger comes out and the bag fluid is running out, please use your chemo spill kit to clean  up the spill. Do not use paper towels or other house hold products.  ** If you have problems or questions regarding your pump, please call either the [phone] or the cancer center Monday-Friday 8:00am-4:30pm at [phone] and we will assist you.  If you are unable to get assistance then go to [facility] Emergency Room, ask the staff to contact the IV team for assistance.    Oxaliplatin Injection What is this medicine? OXALIPLATIN (ox AL i PLA tin) is a chemotherapy drug. It targets fast dividing cells, like cancer cells, and causes these cells to die. This medicine is used to treat cancers of the colon and rectum, and many other cancers. This medicine may be used for other purposes; ask your health care provider or pharmacist if you have questions. COMMON BRAND NAME(S): Eloxatin What should I tell my health care provider before I take this medicine? They need to know if you have any of these conditions: -kidney disease -an unusual or allergic reaction to oxaliplatin, other chemotherapy, other medicines, foods, dyes, or preservatives -pregnant or trying to get pregnant -breast-feeding How should I use this medicine? This drug is given as an infusion into a vein. It is administered in a hospital or clinic by a specially trained health care professional. Talk to your pediatrician regarding the use of this medicine in children. Special care may be needed. Overdosage: If you think you have taken too much of this medicine contact a poison control center or emergency room  at once. NOTE: This medicine is only for you. Do not share this medicine with others. What if I miss a dose? It is important not to miss a dose. Call your doctor or health care professional if you are unable to keep an appointment. What may interact with this medicine? -medicines to increase blood counts like filgrastim, pegfilgrastim, sargramostim -probenecid -some antibiotics like amikacin, gentamicin,  neomycin, polymyxin B, streptomycin, tobramycin -zalcitabine Talk to your doctor or health care professional before taking any of these medicines: -acetaminophen -aspirin -ibuprofen -ketoprofen -naproxen This list may not describe all possible interactions. Give your health care provider a list of all the medicines, herbs, non-prescription drugs, or dietary supplements you use. Also tell them if you smoke, drink alcohol, or use illegal drugs. Some items may interact with your medicine. What should I watch for while using this medicine? Your condition will be monitored carefully while you are receiving this medicine. You will need important blood work done while you are taking this medicine. This medicine can make you more sensitive to cold. Do not drink cold drinks or use ice. Cover exposed skin before coming in contact with cold temperatures or cold objects. When out in cold weather wear warm clothing and cover your mouth and nose to warm the air that goes into your lungs. Tell your doctor if you get sensitive to the cold. This drug may make you feel generally unwell. This is not uncommon, as chemotherapy can affect healthy cells as well as cancer cells. Report any side effects. Continue your course of treatment even though you feel ill unless your doctor tells you to stop. In some cases, you may be given additional medicines to help with side effects. Follow all directions for their use. Call your doctor or health care professional for advice if you get a fever, chills or sore throat, or other symptoms of a cold or flu. Do not treat yourself. This drug decreases your body's ability to fight infections. Try to avoid being around people who are sick. This medicine may increase your risk to bruise or bleed. Call your doctor or health care professional if you notice any unusual bleeding. Be careful brushing and flossing your teeth or using a toothpick because you may get an infection or bleed more  easily. If you have any dental work done, tell your dentist you are receiving this medicine. Avoid taking products that contain aspirin, acetaminophen, ibuprofen, naproxen, or ketoprofen unless instructed by your doctor. These medicines may hide a fever. Do not become pregnant while taking this medicine. Women should inform their doctor if they wish to become pregnant or think they might be pregnant. There is a potential for serious side effects to an unborn child. Talk to your health care professional or pharmacist for more information. Do not breast-feed an infant while taking this medicine. Call your doctor or health care professional if you get diarrhea. Do not treat yourself. What side effects may I notice from receiving this medicine? Side effects that you should report to your doctor or health care professional as soon as possible: -allergic reactions like skin rash, itching or hives, swelling of the face, lips, or tongue -low blood counts - This drug may decrease the number of white blood cells, red blood cells and platelets. You may be at increased risk for infections and bleeding. -signs of infection - fever or chills, cough, sore throat, pain or difficulty passing urine -signs of decreased platelets or bleeding - bruising, pinpoint red spots on the  skin, black, tarry stools, nosebleeds -signs of decreased red blood cells - unusually weak or tired, fainting spells, lightheadedness -breathing problems -chest pain, pressure -cough -diarrhea -jaw tightness -mouth sores -nausea and vomiting -pain, swelling, redness or irritation at the injection site -pain, tingling, numbness in the hands or feet -problems with balance, talking, walking -redness, blistering, peeling or loosening of the skin, including inside the mouth -trouble passing urine or change in the amount of urine Side effects that usually do not require medical attention (report to your doctor or health care professional if  they continue or are bothersome): -changes in vision -constipation -hair loss -loss of appetite -metallic taste in the mouth or changes in taste -stomach pain This list may not describe all possible side effects. Call your doctor for medical advice about side effects. You may report side effects to FDA at 1-800-FDA-1088. Where should I keep my medicine? This drug is given in a hospital or clinic and will not be stored at home. NOTE: This sheet is a summary. It may not cover all possible information. If you have questions about this medicine, talk to your doctor, pharmacist, or health care provider.  2019 Elsevier/Gold Standard (2007-11-27 17:22:47)  Leucovorin injection What is this medicine? LEUCOVORIN (loo koe VOR in) is used to prevent or treat the harmful effects of some medicines. This medicine is used to treat anemia caused by a low amount of folic acid in the body. It is also used with 5-fluorouracil (5-FU) to treat colon cancer. This medicine may be used for other purposes; ask your health care provider or pharmacist if you have questions. What should I tell my health care provider before I take this medicine? They need to know if you have any of these conditions: -anemia from low levels of vitamin B-12 in the blood -an unusual or allergic reaction to leucovorin, folic acid, other medicines, foods, dyes, or preservatives -pregnant or trying to get pregnant -breast-feeding How should I use this medicine? This medicine is for injection into a muscle or into a vein. It is given by a health care professional in a hospital or clinic setting. Talk to your pediatrician regarding the use of this medicine in children. Special care may be needed. Overdosage: If you think you have taken too much of this medicine contact a poison control center or emergency room at once. NOTE: This medicine is only for you. Do not share this medicine with others. What if I miss a dose? This does not  apply. What may interact with this medicine? -capecitabine -fluorouracil -phenobarbital -phenytoin -primidone -trimethoprim-sulfamethoxazole This list may not describe all possible interactions. Give your health care provider a list of all the medicines, herbs, non-prescription drugs, or dietary supplements you use. Also tell them if you smoke, drink alcohol, or use illegal drugs. Some items may interact with your medicine. What should I watch for while using this medicine? Your condition will be monitored carefully while you are receiving this medicine. This medicine may increase the side effects of 5-fluorouracil, 5-FU. Tell your doctor or health care professional if you have diarrhea or mouth sores that do not get better or that get worse. What side effects may I notice from receiving this medicine? Side effects that you should report to your doctor or health care professional as soon as possible: -allergic reactions like skin rash, itching or hives, swelling of the face, lips, or tongue -breathing problems -fever, infection -mouth sores -unusual bleeding or bruising -unusually weak or tired Side effects that  usually do not require medical attention (report to your doctor or health care professional if they continue or are bothersome): -constipation or diarrhea -loss of appetite -nausea, vomiting This list may not describe all possible side effects. Call your doctor for medical advice about side effects. You may report side effects to FDA at 1-800-FDA-1088. Where should I keep my medicine? This drug is given in a hospital or clinic and will not be stored at home. NOTE: This sheet is a summary. It may not cover all possible information. If you have questions about this medicine, talk to your doctor, pharmacist, or health care provider.  2019 Elsevier/Gold Standard (2007-11-06 16:50:29)  Fluorouracil, 5-FU injection What is this medicine? FLUOROURACIL, 5-FU (flure oh YOOR a sil) is  a chemotherapy drug. It slows the growth of cancer cells. This medicine is used to treat many types of cancer like breast cancer, colon or rectal cancer, pancreatic cancer, and stomach cancer. This medicine may be used for other purposes; ask your health care provider or pharmacist if you have questions. COMMON BRAND NAME(S): Adrucil What should I tell my health care provider before I take this medicine? They need to know if you have any of these conditions: -blood disorders -dihydropyrimidine dehydrogenase (DPD) deficiency -infection (especially a virus infection such as chickenpox, cold sores, or herpes) -kidney disease -liver disease -malnourished, poor nutrition -recent or ongoing radiation therapy -an unusual or allergic reaction to fluorouracil, other chemotherapy, other medicines, foods, dyes, or preservatives -pregnant or trying to get pregnant -breast-feeding How should I use this medicine? This drug is given as an infusion or injection into a vein. It is administered in a hospital or clinic by a specially trained health care professional. Talk to your pediatrician regarding the use of this medicine in children. Special care may be needed. Overdosage: If you think you have taken too much of this medicine contact a poison control center or emergency room at once. NOTE: This medicine is only for you. Do not share this medicine with others. What if I miss a dose? It is important not to miss your dose. Call your doctor or health care professional if you are unable to keep an appointment. What may interact with this medicine? -allopurinol -cimetidine -dapsone -digoxin -hydroxyurea -leucovorin -levamisole -medicines for seizures like ethotoin, fosphenytoin, phenytoin -medicines to increase blood counts like filgrastim, pegfilgrastim, sargramostim -medicines that treat or prevent blood clots like warfarin, enoxaparin, and  dalteparin -methotrexate -metronidazole -pyrimethamine -some other chemotherapy drugs like busulfan, cisplatin, estramustine, vinblastine -trimethoprim -trimetrexate -vaccines Talk to your doctor or health care professional before taking any of these medicines: -acetaminophen -aspirin -ibuprofen -ketoprofen -naproxen This list may not describe all possible interactions. Give your health care provider a list of all the medicines, herbs, non-prescription drugs, or dietary supplements you use. Also tell them if you smoke, drink alcohol, or use illegal drugs. Some items may interact with your medicine. What should I watch for while using this medicine? Visit your doctor for checks on your progress. This drug may make you feel generally unwell. This is not uncommon, as chemotherapy can affect healthy cells as well as cancer cells. Report any side effects. Continue your course of treatment even though you feel ill unless your doctor tells you to stop. In some cases, you may be given additional medicines to help with side effects. Follow all directions for their use. Call your doctor or health care professional for advice if you get a fever, chills or sore throat, or other symptoms of  a cold or flu. Do not treat yourself. This drug decreases your body's ability to fight infections. Try to avoid being around people who are sick. This medicine may increase your risk to bruise or bleed. Call your doctor or health care professional if you notice any unusual bleeding. Be careful brushing and flossing your teeth or using a toothpick because you may get an infection or bleed more easily. If you have any dental work done, tell your dentist you are receiving this medicine. Avoid taking products that contain aspirin, acetaminophen, ibuprofen, naproxen, or ketoprofen unless instructed by your doctor. These medicines may hide a fever. Do not become pregnant while taking this medicine. Women should inform their  doctor if they wish to become pregnant or think they might be pregnant. There is a potential for serious side effects to an unborn child. Talk to your health care professional or pharmacist for more information. Do not breast-feed an infant while taking this medicine. Men should inform their doctor if they wish to father a child. This medicine may lower sperm counts. Do not treat diarrhea with over the counter products. Contact your doctor if you have diarrhea that lasts more than 2 days or if it is severe and watery. This medicine can make you more sensitive to the sun. Keep out of the sun. If you cannot avoid being in the sun, wear protective clothing and use sunscreen. Do not use sun lamps or tanning beds/booths. What side effects may I notice from receiving this medicine? Side effects that you should report to your doctor or health care professional as soon as possible: -allergic reactions like skin rash, itching or hives, swelling of the face, lips, or tongue -low blood counts - this medicine may decrease the number of white blood cells, red blood cells and platelets. You may be at increased risk for infections and bleeding. -signs of infection - fever or chills, cough, sore throat, pain or difficulty passing urine -signs of decreased platelets or bleeding - bruising, pinpoint red spots on the skin, black, tarry stools, blood in the urine -signs of decreased red blood cells - unusually weak or tired, fainting spells, lightheadedness -breathing problems -changes in vision -chest pain -mouth sores -nausea and vomiting -pain, swelling, redness at site where injected -pain, tingling, numbness in the hands or feet -redness, swelling, or sores on hands or feet -stomach pain -unusual bleeding Side effects that usually do not require medical attention (report to your doctor or health care professional if they continue or are bothersome): -changes in finger or toe nails -diarrhea -dry or itchy  skin -hair loss -headache -loss of appetite -sensitivity of eyes to the light -stomach upset -unusually teary eyes This list may not describe all possible side effects. Call your doctor for medical advice about side effects. You may report side effects to FDA at 1-800-FDA-1088. Where should I keep my medicine? This drug is given in a hospital or clinic and will not be stored at home. NOTE: This sheet is a summary. It may not cover all possible information. If you have questions about this medicine, talk to your doctor, pharmacist, or health care provider.  2019 Elsevier/Gold Standard (2007-09-05 13:53:16)

## 2018-07-03 NOTE — Progress Notes (Signed)
Went to infusion to introduce myself as Arboriculturist and to offer available resources.  Discussed one-time $48 Victoria and qualifications to assist with personal expenses while going through treatment and what is needed to apply. She verbalized understanding. Gave her my card for any additional financial questions or concerns.

## 2018-07-03 NOTE — Progress Notes (Signed)
Per Dr. Benay Spice: OK to treat with labs from hospital visit. No need to draw new labs before cycle 1 of FOLFOX today

## 2018-07-04 ENCOUNTER — Telehealth: Payer: Self-pay | Admitting: *Deleted

## 2018-07-04 ENCOUNTER — Other Ambulatory Visit: Payer: Self-pay | Admitting: *Deleted

## 2018-07-04 MED ORDER — LIDOCAINE-PRILOCAINE 2.5-2.5 % EX CREA: 1.0000 "application " | TOPICAL_CREAM | CUTANEOUS | 1 refills | Status: DC

## 2018-07-04 MED ORDER — DEXAMETHASONE 4 MG PO TABS: 4.0000 mg | ORAL_TABLET | Freq: Two times a day (BID) | ORAL | 2 refills | Status: DC

## 2018-07-04 NOTE — Telephone Encounter (Addendum)
Patient reports she started the compazine 10 mg every 6 hours when she got home last night and continues this now. Last dose was 0830. Was feeling slightly "queasy" this morning and vomited at 11:00. Instructed to to add the Ativan 0.5 mg sl every 6 hours so that she is taking something every 3 hours. Wait 1 hour after ativan and if no further emesis start clear liquids and advance to bland diet as tolerated. Call back if vomiting persists and we can give IV fluids and IV anti-emetic if needed. Will also send script for Decadron bid X 3 days to start day after each IV chemo treatment. Instructed her to start this today. Will inquire with NP about adding oral steroids for 3 days after each treatment. Informed her that her premeds will be adjusted with next treatment as well. MD will see her prior to the treatment. Also informed patient (as well as pharmacy) that the PA for her Duragesic was approved.

## 2018-07-04 NOTE — Telephone Encounter (Signed)
Patient called again to clarify the directions on her Duragesic patch: does she change it every 3 days or every 72 hours? Instructed her it is changed every 72 hours, but if she needs to do so a few hours early that is OK. Just do not change it every other day (48 hours). She adds that she will start the decadron now and she has not vomited since conversation earlier.

## 2018-07-05 ENCOUNTER — Encounter: Payer: Self-pay | Admitting: *Deleted

## 2018-07-05 ENCOUNTER — Other Ambulatory Visit: Payer: Self-pay | Admitting: Physician Assistant

## 2018-07-05 ENCOUNTER — Inpatient Hospital Stay: Payer: 59

## 2018-07-05 ENCOUNTER — Other Ambulatory Visit: Payer: 59 | Admitting: General Practice

## 2018-07-05 ENCOUNTER — Inpatient Hospital Stay: Payer: 59 | Admitting: General Practice

## 2018-07-05 ENCOUNTER — Other Ambulatory Visit: Payer: Self-pay | Admitting: Radiology

## 2018-07-05 VITALS — BP 122/81 | HR 90 | Resp 18

## 2018-07-05 DIAGNOSIS — C170 Malignant neoplasm of duodenum: Secondary | ICD-10-CM | POA: Diagnosis not present

## 2018-07-05 DIAGNOSIS — Z95828 Presence of other vascular implants and grafts: Secondary | ICD-10-CM

## 2018-07-05 MED ORDER — SODIUM CHLORIDE 0.9% FLUSH
10.0000 mL | INTRAVENOUS | Status: DC | PRN
  Administered 2018-07-05: 10 mL via INTRAVENOUS
  Filled 2018-07-05: qty 10

## 2018-07-05 MED ORDER — HEPARIN SOD (PORK) LOCK FLUSH 100 UNIT/ML IV SOLN
500.0000 [IU] | Freq: Once | INTRAVENOUS | Status: AC
  Administered 2018-07-05: 500 [IU] via INTRAVENOUS
  Filled 2018-07-05: qty 5

## 2018-07-05 NOTE — Progress Notes (Signed)
Spoke with Suanne Marker at [facility] Pathology: Block sent out today to [facility] per Dr. Benay Spice request on case [id number].

## 2018-07-06 ENCOUNTER — Other Ambulatory Visit: Payer: Self-pay

## 2018-07-06 ENCOUNTER — Encounter (HOSPITAL_COMMUNITY): Payer: Self-pay

## 2018-07-06 ENCOUNTER — Ambulatory Visit (HOSPITAL_COMMUNITY)
Admission: RE | Admit: 2018-07-06 | Discharge: 2018-07-06 | Disposition: A | Discharge status: Home / Self Care | Payer: 59 | Source: Ambulatory Visit

## 2018-07-06 ENCOUNTER — Ambulatory Visit (HOSPITAL_COMMUNITY)
Admission: RE | Admit: 2018-07-06 | Discharge: 2018-07-06 | Disposition: A | Discharge status: Home / Self Care | Payer: 59 | Source: Ambulatory Visit | Attending: Oncology | Admitting: Oncology

## 2018-07-06 ENCOUNTER — Other Ambulatory Visit: Payer: Self-pay | Admitting: Oncology

## 2018-07-06 DIAGNOSIS — C170 Malignant neoplasm of duodenum: Secondary | ICD-10-CM | POA: Insufficient documentation

## 2018-07-06 DIAGNOSIS — Z79899 Other long term (current) drug therapy: Secondary | ICD-10-CM | POA: Insufficient documentation

## 2018-07-06 DIAGNOSIS — Z885 Allergy status to narcotic agent status: Secondary | ICD-10-CM | POA: Insufficient documentation

## 2018-07-06 DIAGNOSIS — F17200 Nicotine dependence, unspecified, uncomplicated: Secondary | ICD-10-CM | POA: Diagnosis not present

## 2018-07-06 HISTORY — PX: IR IMAGING GUIDED PORT INSERTION: IMG5740

## 2018-07-06 LAB — CBC
HCT: 30.1 % — ABNORMAL LOW (ref 36.0–46.0)
Hemoglobin: 9.7 g/dL — ABNORMAL LOW (ref 12.0–15.0)
MCH: 30.2 pg (ref 26.0–34.0)
MCHC: 32.2 g/dL (ref 30.0–36.0)
MCV: 93.8 fL (ref 80.0–100.0)
PLATELETS: 491 10*3/uL — AB (ref 150–400)
RBC: 3.21 MIL/uL — ABNORMAL LOW (ref 3.87–5.11)
RDW: 13.1 % (ref 11.5–15.5)
WBC: 6.3 10*3/uL (ref 4.0–10.5)
nRBC: 0 % (ref 0.0–0.2)

## 2018-07-06 LAB — PROTIME-INR
INR: 1.13
Prothrombin Time: 14.4 seconds (ref 11.4–15.2)

## 2018-07-06 MED ORDER — MIDAZOLAM HCL 2 MG/2ML IJ SOLN
INTRAMUSCULAR | Status: AC
  Filled 2018-07-06: qty 2

## 2018-07-06 MED ORDER — LIDOCAINE HCL 1 % IJ SOLN
INTRAMUSCULAR | Status: AC
  Filled 2018-07-06: qty 20

## 2018-07-06 MED ORDER — LIDOCAINE-EPINEPHRINE (PF) 2 %-1:200000 IJ SOLN
INTRAMUSCULAR | Status: AC | PRN
  Administered 2018-07-06: 10 mL

## 2018-07-06 MED ORDER — LIDOCAINE-EPINEPHRINE (PF) 2 %-1:200000 IJ SOLN
INTRAMUSCULAR | Status: AC
  Filled 2018-07-06: qty 20

## 2018-07-06 MED ORDER — CEFAZOLIN SODIUM-DEXTROSE 2-4 GM/100ML-% IV SOLN
2.0000 g | Freq: Once | INTRAVENOUS | Status: AC
  Administered 2018-07-06: 2 g via INTRAVENOUS

## 2018-07-06 MED ORDER — FENTANYL CITRATE (PF) 100 MCG/2ML IJ SOLN
INTRAMUSCULAR | Status: AC
  Filled 2018-07-06: qty 2

## 2018-07-06 MED ORDER — HEPARIN SOD (PORK) LOCK FLUSH 100 UNIT/ML IV SOLN
INTRAVENOUS | Status: AC | PRN
  Administered 2018-07-06: 500 [IU]

## 2018-07-06 MED ORDER — FENTANYL CITRATE (PF) 100 MCG/2ML IJ SOLN
INTRAMUSCULAR | Status: AC | PRN
  Administered 2018-07-06 (×2): 50 ug via INTRAVENOUS

## 2018-07-06 MED ORDER — HEPARIN SOD (PORK) LOCK FLUSH 100 UNIT/ML IV SOLN
INTRAVENOUS | Status: AC
  Filled 2018-07-06: qty 5

## 2018-07-06 MED ORDER — MIDAZOLAM HCL 2 MG/2ML IJ SOLN
INTRAMUSCULAR | Status: AC | PRN
  Administered 2018-07-06 (×3): 1 mg via INTRAVENOUS

## 2018-07-06 MED ORDER — LIDOCAINE HCL (PF) 1 % IJ SOLN
INTRAMUSCULAR | Status: AC | PRN
  Administered 2018-07-06: 10 mL

## 2018-07-06 MED ORDER — SODIUM CHLORIDE 0.9 % IV SOLN
INTRAVENOUS | Status: DC
  Administered 2018-07-06: 11:00:00 via INTRAVENOUS

## 2018-07-06 MED ORDER — CEFAZOLIN SODIUM-DEXTROSE 2-4 GM/100ML-% IV SOLN
INTRAVENOUS | Status: AC
  Administered 2018-07-06: 2 g via INTRAVENOUS
  Filled 2018-07-06: qty 100

## 2018-07-06 NOTE — Procedures (Signed)
Duodenal ca  S/p RT IJ POWER PORT  Tip svcra no comp Stable EBL min Ready for use Full report in pacs

## 2018-07-06 NOTE — H&P (Signed)
Chief Complaint: Patient was seen in consultation today for port placement.  Referring Physician(s): Ladell Pier  Supervising Physician: Daryll Brod  Patient Status: [facility] - Out-pt  History of Present Illness: Hailey Houston is a 48 y.o. female with a past medical history significant for ADD, anxiety and adenocarcinoma of the duodenum followed by Dr. Benay Spice who has requested port placement to continue chemotherapy. Per patient she has received one chemotherapy treatment via PICC without issues and is planned for further chemotherapy next week.  She reports that she is feeling ok today but overall feels generally fatigued and unwell. She has been unable to have a bowel movement for about 3 weeks despite the use of various stool softeners, laxatives and enemas. She denies any abdominal pain or vomiting but she does c/o occasional nausea and a poor appetite. She states understanding to the procedure and wishes to proceed.   Past Medical History:  Diagnosis Date  . ADD (attention deficit disorder)   . Adenocarcinoma of duodenum ([place]) 06/16/2018  . Anxiety   . Family history of adverse reaction to anesthesia    mom goes into afib  . Wears glasses    driving    Past Surgical History:  Procedure Laterality Date  . BILIARY STENT PLACEMENT N/A 06/14/2018   Procedure: BILIARY STENT PLACEMENT;  Surgeon: Milus Banister, MD;  Location: [facility];  Service: Endoscopy;  Laterality: N/A;  . BIOPSY  06/14/2018   Procedure: BIOPSY;  Surgeon: Milus Banister, MD;  Location: [facility];  Service: Endoscopy;;  . ENDOSCOPIC RETROGRADE CHOLANGIOPANCREATOGRAPHY (ERCP) WITH PROPOFOL N/A 06/14/2018   Procedure: ENDOSCOPIC RETROGRADE CHOLANGIOPANCREATOGRAPHY (ERCP) WITH PROPOFOL;  Surgeon: Milus Banister, MD;  Location: [facility];  Service: Endoscopy;  Laterality: N/A;  . ESOPHAGOGASTRODUODENOSCOPY N/A 06/14/2018   Procedure: ESOPHAGOGASTRODUODENOSCOPY (EGD);  Surgeon: Milus Banister,  MD;  Location: [facility];  Service: Endoscopy;  Laterality: N/A;  . EUS N/A 06/14/2018   Procedure: UPPER ENDOSCOPIC ULTRASOUND (EUS) RADIAL;  Surgeon: Milus Banister, MD;  Location: [facility];  Service: Endoscopy;  Laterality: N/A;  . ORIF ANKLE FRACTURE Left 08/29/2013   Procedure: LEFT FIBULA -FRACTURE OPEN TREATMENT DISTAL FIBULAR (LATERALERAL MALLEOLUS) INCLUDES INTERNAL FIXATION, ;  Surgeon: [name], MD;  Location: [place];  Service: Orthopedics;  Laterality: Left;  . PERINEAL LACERATION REPAIR     post childbirth  . SPHINCTEROTOMY  06/14/2018   Procedure: SPHINCTEROTOMY;  Surgeon: Milus Banister, MD;  Location: [facility];  Service: Endoscopy;;  . TIBIA IM NAIL INSERTION Left 08/29/2013   Procedure: FRACTURE TREATMENT TIBIAL SHAFT BY INTRAMEDULLARY IMPLANT WITH SCREWS ;  Surgeon: [name], MD;  Location: [place];  Service: Orthopedics;  Laterality: Left;    Allergies: Oxycodone  Medications: Prior to Admission medications   Medication Sig Start Date End Date Taking? Authorizing Provider  amphetamine-dextroamphetamine (ADDERALL) 30 MG tablet Take 30 mg by mouth 2 (two) times daily.   Yes [provider]  clobetasol cream (TEMOVATE) 0.05 % Apply topically 2 (two) times daily. 06/30/18  Yes Hosie Poisson, MD  dexamethasone (DECADRON) 4 MG tablet Take 1 tablet (4 mg total) by mouth 2 (two) times daily. X 3 days after each chemo (start day after chemo) 07/04/18  Yes Owens Shark, NP  diphenhydrAMINE (BENADRYL) 50 MG capsule Take 1 capsule (50 mg total) by mouth every 6 (six) hours as needed for itching. 06/30/18  Yes Hosie Poisson, MD  famotidine (PEPCID) 20 MG tablet Take 1  tablet (20 mg total) by mouth 2 (two) times daily. 06/30/18  Yes Hosie Poisson, MD  fentaNYL (DURAGESIC) 75 MCG/HR Place 1 patch onto the skin every 3 (three) days. 07/02/18  Yes Ladell Pier, MD  HYDROmorphone (DILAUDID) 4 MG tablet Take 1-2  tablets (4-8 mg total) by mouth every 4 (four) hours as needed for severe pain. 07/02/18  Yes Ladell Pier, MD  hydrOXYzine (ATARAX/VISTARIL) 50 MG tablet Take 1 tablet (50 mg total) by mouth 3 (three) times daily as needed for itching. 06/30/18  Yes Hosie Poisson, MD  LORazepam (ATIVAN) 0.5 MG tablet Take 1 tablet (0.5 mg total) by mouth every 6 (six) hours as needed for anxiety (OR NAUSEA). 07/02/18  Yes Ladell Pier, MD  prochlorperazine (COMPAZINE) 10 MG tablet Take 1 tablet (10 mg total) by mouth every 6 (six) hours as needed. 07/02/18  Yes Ladell Pier, MD  senna-docusate (SENOKOT-S) 8.6-50 MG tablet Take 2 tablets by mouth 2 (two) times daily. 06/30/18  Yes Hosie Poisson, MD  sucralfate (CARAFATE) 1 GM/10ML suspension Take 10 mLs (1 g total) by mouth every 8 (eight) hours. 06/30/18  Yes Hosie Poisson, MD  triamcinolone cream (KENALOG) 0.1 % Apply topically 3 (three) times daily. 06/30/18  Yes Hosie Poisson, MD  lidocaine-prilocaine (EMLA) cream Apply 1 application topically as directed. 1 hour prior to port stick and cover with plastic wrap 07/04/18   Owens Shark, NP  ondansetron (ZOFRAN) 8 MG tablet Take 1 tablet every 8 hours as needed for nausea. 07/02/18   Ladell Pier, MD  pantoprazole (PROTONIX) 40 MG tablet Take 1 tablet (40 mg total) by mouth 2 (two) times daily. 06/30/18   Hosie Poisson, MD  Polyethylene Glycol 3350 (MIRALAX PO) Take 17 g by mouth daily. 07/03/18   [provider]     Family History  Problem Relation Age of Onset  . Heart disease Mother   . Heart disease Father     Social History   Socioeconomic History  . Marital status: Divorced    Spouse name: Not on file  . Number of children: Not on file  . Years of education: Not on file  . Highest education level: Not on file  Occupational History  . Not on file  Social Needs  . Financial resource strain: Not on file  . Food insecurity:    Worry: Not on file    Inability: Not on file  .  Transportation needs:    Medical: Not on file    Non-medical: Not on file  Tobacco Use  . Smoking status: Current Some Day Smoker  . Smokeless tobacco: Never Used  Substance and Sexual Activity  . Alcohol use: Yes    Comment: occ  . Drug use: No  . Sexual activity: Not Currently    Comment: a pk/week  Lifestyle  . Physical activity:    Days per week: Not on file    Minutes per session: Not on file  . Stress: Not on file  Relationships  . Social connections:    Talks on phone: Not on file    Gets together: Not on file    Attends religious service: Not on file    Active member of club or organization: Not on file    Attends meetings of clubs or organizations: Not on file    Relationship status: Not on file  Other Topics Concern  . Not on file  Social History Narrative  . Not on file  Review of Systems: A 12 point ROS discussed and pertinent positives are indicated in the HPI above.  All other systems are negative.  Review of Systems  Constitutional: Positive for appetite change and fatigue. Negative for chills and fever.  Respiratory: Negative for cough and shortness of breath.   Cardiovascular: Negative for chest pain.  Gastrointestinal: Positive for abdominal distention, abdominal pain, constipation and nausea. Negative for diarrhea and vomiting.  Genitourinary: Negative for dysuria and hematuria.  Skin: Negative for color change.  Neurological: Negative for dizziness, syncope and headaches.  Psychiatric/Behavioral: Negative for confusion.    Vital Signs: Ht 5\' 11"  (1.803 m)   Wt 226 lb 12 oz (102.9 kg)   BMI 31.63 kg/m   Physical Exam Vitals signs reviewed.  Constitutional:      General: She is not in acute distress.    Appearance: Normal appearance.  HENT:     Head: Normocephalic.  Cardiovascular:     Rate and Rhythm: Normal rate and regular rhythm.  Pulmonary:     Effort: Pulmonary effort is normal.     Breath sounds: Normal breath sounds.    Abdominal:     Palpations: Abdomen is soft.     Tenderness: There is no abdominal tenderness.  Skin:    General: Skin is warm and dry.  Neurological:     Mental Status: She is alert and oriented to person, place, and time.  Psychiatric:        Mood and Affect: Mood normal.        Behavior: Behavior normal.        Thought Content: Thought content normal.        Judgment: Judgment normal.      MD Evaluation Airway: WNL Heart: WNL Abdomen: WNL Chest/ Lungs: WNL ASA  Classification: 3 Mallampati/Airway Score: One   Imaging: Dg Chest 2 View  Result Date: 06/16/2018 CLINICAL DATA:  Pancreatic mass and colon cancer EXAM: CHEST - 2 VIEW COMPARISON:  None. FINDINGS: AP and lateral views of the chest were obtained. The lungs are clear without focal pneumonia, edema, pneumothorax or pleural effusion. The cardiopericardial silhouette is within normal limits for size. Scattered bilateral pulmonary nodules are evident. The visualized bony structures of the thorax are intact. Telemetry leads overlie the chest. IMPRESSION: 1. Bilateral pulmonary nodules highly suspicious for metastatic disease. 2. No edema or pleural effusion. Electronically Signed   By: Misty Stanley M.D.   On: 06/16/2018 20:45   Ct Chest W Contrast  Result Date: 06/20/2018 CLINICAL DATA:  Malignant biliary obstruction status post ERCP and common bile duct stent placement 06/14/2018. Large ulcerated duodenal mass was identified. Evaluate for possible lung nodules. EXAM: CT CHEST WITH CONTRAST TECHNIQUE: Multidetector CT imaging of the chest was performed during intravenous contrast administration. CONTRAST:  85mL OMNIPAQUE IOHEXOL 300 MG/ML  SOLN COMPARISON:  CT abdomen 06/16/2018 FINDINGS: Cardiovascular: The heart is normal in size. No pericardial effusion. The aorta is normal in caliber. No dissection. The branch vessels are patent. No definite coronary artery calcifications. Mediastinum/Nodes: 9.5 mm right subclavicular lymph  node adjacent to the left thyroid lobe is suspicious for malignant adenopathy. Slightly more inferiorly is an 8 mm node between the left carotid artery and the left subclavian artery. Mediastinal lymphadenopathy, worrisome for metastatic disease. 10 mm right hilar node on image number 61. 10 mm subcarinal lymph node on image number 62. 7 mm right paratracheal node on image number 49. The esophagus is grossly normal. Lungs/Pleura: Numerous bilateral pulmonary nodules consistent with metastatic  disease. Some of these demonstrate mild cavitation. Index node in the right upper lobe on image number 64 measures 9.5 mm. Index node in the left lower lobe on image number 120 measures 8 mm. 7 mm right lower lobe on image number 75. No acute pulmonary findings.  No pleural effusion. Upper Abdomen: There is a common bile duct stent in place. There is New significant inflammatory change involving the pancreas and peripancreatic tissues suggesting acute pancreatitis. Is also inflammation and fluid extending into the periportal region and into the anterior pararenal fossa bilaterally. High attenuation lesions in the gallbladder, likely gallstones. Pneumobilia associated with the common bile duct stent. Musculoskeletal: No breast masses, chest wall mass or axillary adenopathy. IMPRESSION: 1. Diffuse metastatic pulmonary nodules in both lungs. 2. Subclavicular, mediastinal and hilar lymph nodes suspicious for metastatic disease. 3. Acute pancreatitis. Poor enhancement of the pancreas could suggest pancreatic necrosis. These results will be called to the ordering clinician or representative by the Radiologist Assistant, and communication documented in the PACS or zVision Dashboard. Electronically Signed   By: Marijo Sanes M.D.   On: 06/20/2018 17:59   Ct Abdomen Pelvis W Contrast  Result Date: 06/16/2018 CLINICAL DATA:  Per patient ECG Thursday recent diagnosis pancreatic mass and colon caMid-Severe lower abdomen pain nauseaNo  prior surgeriesPain since procedure, no fever. EXAM: CT ABDOMEN AND PELVIS WITH CONTRAST TECHNIQUE: Multidetector CT imaging of the abdomen and pelvis was performed using the standard protocol following bolus administration of intravenous contrast. CONTRAST:  179mL ISOVUE-300 IOPAMIDOL (ISOVUE-300) INJECTION 61% COMPARISON:  None. FINDINGS: Lower chest: Multiple lung base nodules consistent with metastatic disease. Two largest on the left, 6 mm nodule, subpleural lateral left lower lobe, image 15, series 4 and 7 mm nodule, posteromedial left lower lobe, image 27. Largest on the right, 6 mm posterior inferior lower lobe nodule, image 23. No acute findings at the lung bases. Hepatobiliary: Liver normal in size and attenuation. No masses or focal lesions. There is intrahepatic biliary air. Gallbladder is mildly distended. It contains dense fluid had at least 1 discrete stone. No wall thickening or adjacent inflammation. There is air within the common bile duct. A distal common bile duct stent extends across the pancreatic head to the second portion of the duodenum. Pancreas: There is fullness of the pancreatic head without a defined mass. Pancreatic duct is dilated to a maximum of 5 mm. There is hazy inflammatory change in the peripancreatic fat tracking along the gastrohepatic ligament. Multiple peripancreatic subcentimeter lymph nodes are noted. Spleen: Normal in size without focal abnormality. Adrenals/Urinary Tract: No adrenal masses. No renal masses. 3-4 mm nonobstructing stone in the upper pole the right kidney. No other intrarenal stones. No hydronephrosis. Ureters are normal in course and in caliber. Bladder is unremarkable. Stomach/Bowel: Mild wall thickening of the duodenum adjacent to the pancreatic head, presumed reactive. Stomach is unremarkable. Small bowel is normal in caliber with no wall thickening or inflammation. Colon is normal in caliber with no wall thickening or inflammation. No CT evidence of a  colonic mass. Normal appendix visualized. Vascular/Lymphatic: There are prominent retroperitoneal lymph nodes all subcentimeter short axis. Portal vein, superior mesenteric vein and splenic vein are patent. No aortic atherosclerosis. Reproductive: Well-positioned IUD. Uterus and adnexa otherwise unremarkable. Other: No abdominal wall hernia.  No ascites. Musculoskeletal: No fracture or acute finding. No osteoblastic or osteolytic lesions. Disc degenerative changes at L2-L3. IMPRESSION: 1. There are peripancreatic inflammatory changes consistent with pancreatitis. No discrete fluid collection is seen to suggest a pseudocyst.  There is no venous thrombosis and no evidence of pancreatic necrosis. No other evidence of an acute abnormality. 2. Fullness of the pancreatic head. No defined mass. Pancreatic duct is dilated to 5 mm. 3. Multiple subcentimeter peripancreatic lymph nodes. Given the history of pancreatic carcinoma this is suspicious for metastatic adenopathy. 4. Well-positioned distal common bile duct stent. There is intrahepatic biliary and common bile duct air. 5. No visualized colonic mass. 6. Multiple lung base nodules consistent with metastatic disease. 7. Nonobstructing stone in the upper pole of the right kidney. Electronically Signed   By: Lajean Manes M.D.   On: 06/16/2018 17:13   Dg Ercp Biliary & Pancreatic Ducts  Result Date: 06/14/2018 CLINICAL DATA:  CBD stent placement EXAM: ERCP TECHNIQUE: Multiple spot images obtained with the fluoroscopic device and submitted for interpretation post-procedure. FLUOROSCOPY TIME:  Fluoroscopy Time:  3 minutes and 28 seconds Radiation Exposure Index (if provided by the fluoroscopic device): Number of Acquired Spot Images: 0 COMPARISON:  None. FINDINGS: Imaging demonstrates cannulation of the common bile duct and contrast filling the biliary tree. There is narrowing of the common bile duct through the pancreas. Subsequent images demonstrate biliary duct balloon  dilatation. The final image demonstrates a metallic stent across the area of narrowing from the dilated common bile duct into the duodenum. IMPRESSION: Successful common bile duct stent placement as described. These images were submitted for radiologic interpretation only. Please see the procedural report for the amount of contrast and the fluoroscopy time utilized. Electronically Signed   By: Marybelle Killings M.D.   On: 06/14/2018 13:50   Dg Abd 2 Views  Result Date: 06/25/2018 CLINICAL DATA:  Epigastric abdominal pain. EXAM: ABDOMEN - 2 VIEW COMPARISON:  CT 34/19/3790 FINDINGS: Metallic biliary stent is noted in place. There is pneumobilia. Nonobstructive bowel gas pattern. Moderate stool in the colon. No free air. IUD noted in the pelvis. Multiple calcified phleboliths in the pelvis. IMPRESSION: No evidence of bowel obstruction or free air. Biliary stent and pneumobilia noted. Electronically Signed   By: Rolm Baptise M.D.   On: 06/25/2018 19:25   Dg Duanne Limerick W Single Cm (sol Or Thin Ba)  Result Date: 06/27/2018 CLINICAL DATA:  Duodenal adenocarcinoma. Evaluate for gastric outlet obstruction. EXAM: UPPER GI SERIES WITHOUT KUB TECHNIQUE: Routine upper GI series was performed with thin barium. FLUOROSCOPY TIME:  Fluoroscopy Time:  5 minutes and 12 seconds Radiation Exposure Index (if provided by the fluoroscopic device): 197 mGy Number of Acquired Spot Images: 0 COMPARISON:  Acute abdomen series of 06/25/2018. ERCP/Endoscopic ultrasound report of 06/14/2018 FINDINGS: Single-contrast, focused upper GI was performed. No esophageal stricture identified. Normal caliber of the stomach, without retained gastric contents. Prompt passage of contrast from the stomach into the duodenal bulb. Anatomy is mildly distorted by biliary stent in place with resultant reflux of contrast into the biliary tree. Best visualized on image 1/5 is moderate narrowing of the proximal duodenum, over approximately 3 cm span. No gastric outlet  obstruction. Normal caliber proximal jejunal loops. IMPRESSION: Moderate proximal duodenal narrowing, without evidence of gastric obstruction to contrast passage Electronically Signed   By: Abigail Miyamoto M.D.   On: 06/27/2018 14:08   [place] Core Biopsy (lymph Nodes)  Result Date: 06/22/2018 INDICATION: 48 year old female with a history of duodenal adenocarcinoma and suspicious left supraclavicular lymphadenopathy. Of note, she does have a brother who was recently diagnosed with sarcoidosis. She presents for ultrasound-guided core biopsy to confirm tissue diagnosis. EXAM: Ultrasound-guided core biopsy of left supraclavicular lymph node. MEDICATIONS:  None. ANESTHESIA/SEDATION: None FLUOROSCOPY TIME:  None COMPLICATIONS: None immediate. PROCEDURE: Informed written consent was obtained from the patient after a thorough discussion of the procedural risks, benefits and alternatives. All questions were addressed. A timeout was performed prior to the initiation of the procedure. Ultrasound was used to interrogate the left supraclavicular fossa. A 1.2 x 0.9 cm hypoechoic lymph node is evident posterior to the jugular vein and adjacent to the carotid artery. A suitable window could be identified. The overlying skin was sterilely prepped and draped in the standard sterile fashion using chlorhexidine skin prep. Local anesthesia was attained by infiltration with 1% lidocaine. A small dermatotomy was made. Under real time sonographic guidance, multiple 18 gauge core biopsies were obtained using the bio Pince automated biopsy device. The biopsy specimens were placed in saline and sent to pathology for further analysis. Post biopsy ultrasound imaging demonstrates no evidence of immediate complication. IMPRESSION: Technically successful ultrasound-guided core biopsy of left supraclavicular lymph node. Electronically Signed   By: Jacqulynn Cadet M.D.   On: 06/22/2018 17:44   [place] Ekg Site Rite  Result Date: 06/21/2018 If Site Rite  image not attached, placement could not be confirmed due to current cardiac rhythm.   Labs:  CBC: Recent Labs    06/21/18 0523 06/22/18 0423 06/25/18 0338 07/06/18 1050  WBC 4.5 4.9 6.9 6.3  HGB 9.3* 9.3* 10.1* 9.7*  HCT 28.8* 29.0* 32.1* 30.1*  PLT 395 438* 481* 491*    COAGS: Recent Labs    06/06/18 1458 06/16/18 1901 07/06/18 1050  INR 1.0 1.05 1.13    BMP: Recent Labs    06/26/18 0901 06/27/18 0405 06/28/18 0423 06/29/18 0245  NA 134* 136 137 134*  K 4.1 4.7 4.1 3.7  CL 100 102 102 103  CO2 26 26 27 26   GLUCOSE 120* 124* 128* 160*  BUN 25* 20 18 18   CALCIUM 9.2 9.0 8.7* 8.5*  CREATININE 0.62 0.57 0.59 0.54  GFRNONAA >60 >60 >60 >60  GFRAA >60 >60 >60 >60    LIVER FUNCTION TESTS: Recent Labs    06/24/18 0323 06/25/18 0338 06/28/18 0423 06/29/18 0245  BILITOT 1.4* 1.3* 1.2 1.0  AST 41 35 29 21  ALT 72* 63* 40 31  ALKPHOS 127* 128* 120 121  PROT 6.4* 7.1 6.5 6.3*  ALBUMIN 3.3* 3.5 3.4* 3.3*    TUMOR MARKERS: Recent Labs    06/06/18 1458  CEA 7.9*  CA199 30    Assessment and Plan:  Patient with recently diagnosed adenocarcinoma of the duodenum followed by Dr. Benay Spice who presents today for port placement. She has right upper extremity PICC in place and has received one chemotherapy treatment via PICC without issues.   Patient has been NPO since 10 pm last night, she does not take blood thinning medications. Afebrile, WBC 6.3, hgb 9.7, plt 491, INR 1.13.  Risks and benefits of image guided port-a-catheter placement was discussed with the patient including, but not limited to bleeding, infection, pneumothorax, or fibrin sheath development and need for additional procedures.  All of the patient's questions were answered, patient is agreeable to proceed.  Consent signed and in chart.  Thank you for this interesting consult.  I greatly enjoyed meeting Hailey Houston and look forward to participating in their care.  A copy of this report  was sent to the requesting provider on this date.  Electronically Signed: Joaquim Nam, PA-C 07/06/2018, 11:20 AM   I spent a total of 30 Minutes n face to  face in clinical consultation, greater than 50% of which was counseling/coordinating care for port placement.

## 2018-07-06 NOTE — Discharge Instructions (Signed)
Moderate Conscious Sedation, Adult, Care After  These instructions provide you with information about caring for yourself after your procedure. Your health care provider may also give you more specific instructions. Your treatment has been planned according to current medical practices, but problems sometimes occur. Call your health care provider if you have any problems or questions after your procedure.  What can I expect after the procedure?  After your procedure, it is common:   To feel sleepy for several hours.   To feel clumsy and have poor balance for several hours.   To have poor judgment for several hours.   To vomit if you eat too soon.  Follow these instructions at home:  For at least 24 hours after the procedure:     Do not:  ? Participate in activities where you could fall or become injured.  ? Drive.  ? Use heavy machinery.  ? Drink alcohol.  ? Take sleeping pills or medicines that cause drowsiness.  ? Make important decisions or sign legal documents.  ? Take care of children on your own.   Rest.  Eating and drinking   Follow the diet recommended by your health care provider.   If you vomit:  ? Drink water, juice, or soup when you can drink without vomiting.  ? Make sure you have little or no nausea before eating solid foods.  General instructions   Have a responsible adult stay with you until you are awake and alert.   Take over-the-counter and prescription medicines only as told by your health care provider.   If you smoke, do not smoke without supervision.   Keep all follow-up visits as told by your health care provider. This is important.  Contact a health care provider if:   You keep feeling nauseous or you keep vomiting.   You feel light-headed.   You develop a rash.   You have a fever.  Get help right away if:   You have trouble breathing.  This information is not intended to replace advice given to you by your health care provider. Make sure you discuss any questions you have  with your health care provider.  Document Released: 02/20/2013 Document Revised: 10/05/2015 Document Reviewed: 08/22/2015  [employer]vier Interactive Patient Education  2019 [employer]  Implanted Port Home Guide  An implanted port is a device that is placed under the skin. It is usually placed in the chest. The device can be used to give IV medicine, to take blood, or for dialysis. You may have an implanted port if:   You need IV medicine that would be irritating to the small veins in your hands or arms.   You need IV medicines, such as antibiotics, for a long period of time.   You need IV nutrition for a long period of time.   You need dialysis.  Having a port means that your health care provider will not need to use the veins in your arms for these procedures. You may have fewer limitations when using a port than you would if you used other types of long-term IVs, and you will likely be able to return to normal activities after your incision heals.  An implanted port has two main parts:   Reservoir. The reservoir is the part where a needle is inserted to give medicines or draw blood. The reservoir is round. After it is placed, it appears as a small, raised area under your skin.   Catheter. The catheter is a thin,   flexible tube that connects the reservoir to a vein. Medicine that is inserted into the reservoir goes into the catheter and then into the vein.  How is my port accessed?  To access your port:   A numbing cream may be placed on the skin over the port site.   Your health care provider will put on a mask and sterile gloves.   The skin over your port will be cleaned carefully with a germ-killing soap and allowed to dry.   Your health care provider will gently pinch the port and insert a needle into it.   Your health care provider will check for a blood return to make sure the port is in the vein and is not clogged.   If your port needs to remain accessed to get medicine continuously (constant  infusion), your health care provider will place a clear bandage (dressing) over the needle site. The dressing and needle will need to be changed every week, or as told by your health care provider.  What is flushing?  Flushing helps keep the port from getting clogged. Follow instructions from your health care provider about how and when to flush the port. Ports are usually flushed with saline solution or a medicine called heparin. The need for flushing will depend on how the port is used:   If the port is only used from time to time to give medicines or draw blood, the port may need to be flushed:  ? Before and after medicines have been given.  ? Before and after blood has been drawn.  ? As part of routine maintenance. Flushing may be recommended every 4-6 weeks.   If a constant infusion is running, the port may not need to be flushed.   Throw away any syringes in a disposal container that is meant for sharp items (sharps container). You can buy a sharps container from a pharmacy, or you can make one by using an empty hard plastic bottle with a cover.  How long will my port stay implanted?  The port can stay in for as long as your health care provider thinks it is needed. When it is time for the port to come out, a surgery will be done to remove it. The surgery will be similar to the procedure that was done to put the port in.  Follow these instructions at home:     Flush your port as told by your health care provider.   If you need an infusion over several days, follow instructions from your health care provider about how to take care of your port site. Make sure you:  ? Wash your hands with soap and water before you change your dressing. If soap and water are not available, use alcohol-based hand sanitizer.  ? Change your dressing as told by your health care provider.  ? Place any used dressings or infusion bags into a plastic bag. Throw that bag in the trash.  ? Keep the dressing that covers the needle clean  and dry. Do not get it wet.  ? Do not use scissors or sharp objects near the tube.  ? Keep the tube clamped, unless it is being used.   Check your port site every day for signs of infection. Check for:  ? Redness, swelling, or pain.  ? Fluid or blood.  ? Pus or a bad smell.   Protect the skin around the port site.  ? Avoid wearing bra straps that rub or irritate the   site.  ? Protect the skin around your port from seat belts. Place a soft pad over your chest if needed.   Bathe or shower as told by your health care provider. The site may get wet as long as you are not actively receiving an infusion.   Return to your normal activities as told by your health care provider. Ask your health care provider what activities are safe for you.   Carry a medical alert card or wear a medical alert bracelet at all times. This will let health care providers know that you have an implanted port in case of an emergency.  Get help right away if:   You have redness, swelling, or pain at the port site.   You have fluid or blood coming from your port site.   You have pus or a bad smell coming from the port site.   You have a fever.  Summary   Implanted ports are usually placed in the chest for long-term IV access.   Follow instructions from your health care provider about flushing the port and changing bandages (dressings).   Take care of the area around your port by avoiding clothing that puts pressure on the area, and by watching for signs of infection.   Protect the skin around your port from seat belts. Place a soft pad over your chest if needed.   Get help right away if you have a fever or you have redness, swelling, pain, drainage, or a bad smell at the port site.  This information is not intended to replace advice given to you by your health care provider. Make sure you discuss any questions you have with your health care provider.  Document Released: 05/02/2005 Document Revised: 06/04/2016 Document Reviewed:  06/04/2016  [employer]vier Interactive Patient Education  2019 [employer]

## 2018-07-09 ENCOUNTER — Inpatient Hospital Stay: Payer: 59

## 2018-07-09 DIAGNOSIS — Z95828 Presence of other vascular implants and grafts: Secondary | ICD-10-CM

## 2018-07-09 DIAGNOSIS — C170 Malignant neoplasm of duodenum: Secondary | ICD-10-CM | POA: Diagnosis not present

## 2018-07-09 MED ORDER — HEPARIN SOD (PORK) LOCK FLUSH 100 UNIT/ML IV SOLN
500.0000 [IU] | Freq: Once | INTRAVENOUS | Status: AC
  Administered 2018-07-09: 500 [IU]
  Filled 2018-07-09: qty 5

## 2018-07-09 MED ORDER — SODIUM CHLORIDE 0.9% FLUSH
10.0000 mL | Freq: Once | INTRAVENOUS | Status: AC
  Administered 2018-07-09: 10 mL
  Filled 2018-07-09: qty 10

## 2018-07-10 ENCOUNTER — Telehealth: Payer: Self-pay | Admitting: *Deleted

## 2018-07-10 NOTE — Telephone Encounter (Addendum)
Left VM requesting return call to discuss an increase in her abdominal pain. Called back and she reports she has had increase in her abdominal pain today. Having good bowel movements and no abdominal swelling. She is still on her Duragesic patch and as of 2 days ago, she lowered her hydromorphone dose to taking #1 tablet instead of #2. Last took med at 2 pm today. Per Dr. Benay Spice: Marena Chancy of cause of increased pain. Suggests she resume taking #2 hydromorphone at a time and call if this does not help or she develops any other symptoms. Will send in refill on her hydromorphone tomorrow. Also informed her that since she has the port she does not need the flush visit for 07/12/18

## 2018-07-11 ENCOUNTER — Other Ambulatory Visit: Payer: Self-pay | Admitting: Nurse Practitioner

## 2018-07-11 ENCOUNTER — Telehealth: Payer: Self-pay | Admitting: *Deleted

## 2018-07-11 DIAGNOSIS — K858 Other acute pancreatitis without necrosis or infection: Secondary | ICD-10-CM

## 2018-07-11 MED ORDER — HYDROMORPHONE HCL 4 MG PO TABS: 4.0000 mg | ORAL_TABLET | ORAL | 0 refills | Status: DC | PRN

## 2018-07-11 NOTE — Telephone Encounter (Signed)
"  Hailey Houston [phone]) calling from pharmacy.  Instructed to increase Hydromorphone.  Refill was to be sent in yesterday but it's not here.  Need something done quickly.  Have two pills left."

## 2018-07-12 ENCOUNTER — Ambulatory Visit: Payer: Self-pay | Admitting: Psychiatry

## 2018-07-14 ENCOUNTER — Other Ambulatory Visit: Payer: Self-pay | Admitting: Oncology

## 2018-07-17 ENCOUNTER — Inpatient Hospital Stay: Payer: 59

## 2018-07-17 ENCOUNTER — Encounter: Payer: Self-pay | Admitting: Nurse Practitioner

## 2018-07-17 ENCOUNTER — Inpatient Hospital Stay: Payer: 59 | Attending: Oncology

## 2018-07-17 ENCOUNTER — Inpatient Hospital Stay (HOSPITAL_BASED_OUTPATIENT_CLINIC_OR_DEPARTMENT_OTHER): Payer: 59 | Admitting: Nurse Practitioner

## 2018-07-17 ENCOUNTER — Telehealth: Payer: Self-pay | Admitting: Nurse Practitioner

## 2018-07-17 VITALS — BP 117/68 | HR 100 | Temp 98.6°F | Resp 18 | Ht 71.0 in | Wt 225.6 lb

## 2018-07-17 DIAGNOSIS — K831 Obstruction of bile duct: Secondary | ICD-10-CM

## 2018-07-17 DIAGNOSIS — D649 Anemia, unspecified: Secondary | ICD-10-CM | POA: Insufficient documentation

## 2018-07-17 DIAGNOSIS — E860 Dehydration: Secondary | ICD-10-CM | POA: Diagnosis not present

## 2018-07-17 DIAGNOSIS — Z79899 Other long term (current) drug therapy: Secondary | ICD-10-CM | POA: Diagnosis not present

## 2018-07-17 DIAGNOSIS — Z95828 Presence of other vascular implants and grafts: Secondary | ICD-10-CM

## 2018-07-17 DIAGNOSIS — R110 Nausea: Secondary | ICD-10-CM | POA: Diagnosis not present

## 2018-07-17 DIAGNOSIS — Z5111 Encounter for antineoplastic chemotherapy: Secondary | ICD-10-CM | POA: Insufficient documentation

## 2018-07-17 DIAGNOSIS — C170 Malignant neoplasm of duodenum: Secondary | ICD-10-CM

## 2018-07-17 DIAGNOSIS — R21 Rash and other nonspecific skin eruption: Secondary | ICD-10-CM | POA: Insufficient documentation

## 2018-07-17 LAB — CMP (CANCER CENTER ONLY)
ALT: 51 U/L — ABNORMAL HIGH (ref 0–44)
AST: 46 U/L — ABNORMAL HIGH (ref 15–41)
Albumin: 3.1 g/dL — ABNORMAL LOW (ref 3.5–5.0)
Alkaline Phosphatase: 128 U/L — ABNORMAL HIGH (ref 38–126)
Anion gap: 10 (ref 5–15)
BUN: 5 mg/dL — ABNORMAL LOW (ref 6–20)
CO2: 26 mmol/L (ref 22–32)
Calcium: 8.7 mg/dL — ABNORMAL LOW (ref 8.9–10.3)
Chloride: 102 mmol/L (ref 98–111)
Creatinine: 0.74 mg/dL (ref 0.44–1.00)
GFR, Est AFR Am: >60 mL/min
GFR, Estimated: >60 mL/min
Glucose, Bld: 130 mg/dL — ABNORMAL HIGH (ref 70–99)
Potassium: 3.3 mmol/L — ABNORMAL LOW (ref 3.5–5.1)
Sodium: 138 mmol/L (ref 135–145)
Total Bilirubin: 0.6 mg/dL (ref 0.3–1.2)
Total Protein: 6.2 g/dL — ABNORMAL LOW (ref 6.5–8.1)

## 2018-07-17 LAB — CBC WITH DIFFERENTIAL (CANCER CENTER ONLY)
Abs Immature Granulocytes: 0 10*3/uL (ref 0.00–0.07)
Basophils Absolute: 0 10*3/uL (ref 0.0–0.1)
Basophils Relative: 1 %
EOS PCT: 3 %
Eosinophils Absolute: 0.1 10*3/uL (ref 0.0–0.5)
HEMATOCRIT: 28.3 % — AB (ref 36.0–46.0)
HEMOGLOBIN: 9.1 g/dL — AB (ref 12.0–15.0)
Immature Granulocytes: 0 %
Lymphocytes Relative: 29 %
Lymphs Abs: 0.9 10*3/uL (ref 0.7–4.0)
MCH: 29.1 pg (ref 26.0–34.0)
MCHC: 32.2 g/dL (ref 30.0–36.0)
MCV: 90.4 fL (ref 80.0–100.0)
Monocytes Absolute: 0.5 10*3/uL (ref 0.1–1.0)
Monocytes Relative: 16 %
Neutro Abs: 1.6 10*3/uL — ABNORMAL LOW (ref 1.7–7.7)
Neutrophils Relative %: 51 %
Platelet Count: 200 10*3/uL (ref 150–400)
RBC: 3.13 MIL/uL — ABNORMAL LOW (ref 3.87–5.11)
RDW: 13.7 % (ref 11.5–15.5)
WBC Count: 3.1 10*3/uL — ABNORMAL LOW (ref 4.0–10.5)
nRBC: 0 % (ref 0.0–0.2)

## 2018-07-17 MED ORDER — LEUCOVORIN CALCIUM INJECTION 350 MG
400.0000 mg/m2 | Freq: Once | INTRAVENOUS | Status: AC
  Administered 2018-07-17: 908 mg via INTRAVENOUS
  Filled 2018-07-17: qty 45.4

## 2018-07-17 MED ORDER — SODIUM CHLORIDE 0.9 % IV SOLN
2400.0000 mg/m2 | INTRAVENOUS | Status: DC
  Administered 2018-07-17: 5450 mg via INTRAVENOUS
  Filled 2018-07-17: qty 109

## 2018-07-17 MED ORDER — FLUOROURACIL CHEMO INJECTION 2.5 GM/50ML
400.0000 mg/m2 | Freq: Once | INTRAVENOUS | Status: AC
  Administered 2018-07-17: 900 mg via INTRAVENOUS
  Filled 2018-07-17: qty 18

## 2018-07-17 MED ORDER — DEXTROSE 5 % IV SOLN
Freq: Once | INTRAVENOUS | Status: AC
  Administered 2018-07-17: 13:00:00 via INTRAVENOUS
  Filled 2018-07-17: qty 250

## 2018-07-17 MED ORDER — OXALIPLATIN CHEMO INJECTION 100 MG/20ML
88.0000 mg/m2 | Freq: Once | INTRAVENOUS | Status: AC
  Administered 2018-07-17: 200 mg via INTRAVENOUS
  Filled 2018-07-17: qty 40

## 2018-07-17 MED ORDER — FENTANYL 75 MCG/HR TD PT72: 1.0000 | MEDICATED_PATCH | TRANSDERMAL | 0 refills | Status: DC

## 2018-07-17 MED ORDER — PALONOSETRON HCL INJECTION 0.25 MG/5ML
0.2500 mg | Freq: Once | INTRAVENOUS | Status: AC
  Administered 2018-07-17: 0.25 mg via INTRAVENOUS

## 2018-07-17 MED ORDER — SODIUM CHLORIDE 0.9 % IV SOLN
Freq: Once | INTRAVENOUS | Status: AC
  Administered 2018-07-17: 14:00:00 via INTRAVENOUS
  Filled 2018-07-17: qty 5

## 2018-07-17 MED ORDER — SODIUM CHLORIDE 0.9% FLUSH
10.0000 mL | Freq: Once | INTRAVENOUS | Status: AC
  Administered 2018-07-17: 10 mL
  Filled 2018-07-17: qty 10

## 2018-07-17 MED ORDER — PALONOSETRON HCL INJECTION 0.25 MG/5ML
INTRAVENOUS | Status: AC
  Filled 2018-07-17: qty 5

## 2018-07-17 MED ORDER — DEXTROSE 5 % IV SOLN
Freq: Once | INTRAVENOUS | Status: AC
  Administered 2018-07-17: 14:00:00 via INTRAVENOUS
  Filled 2018-07-17: qty 250

## 2018-07-17 NOTE — Telephone Encounter (Signed)
Gave avs and calendar ° °

## 2018-07-17 NOTE — Patient Instructions (Signed)
[facility] Discharge Instructions for Patients Receiving Chemotherapy  Today you received the following chemotherapy agents: Oxaliplatin (Eloxatin), Leucovorin, Fluorouracil (Adrucil, 5-FU)  To help prevent nausea and vomiting after your treatment, we encourage you to take your nausea medication as directed.    If you develop nausea and vomiting that is not controlled by your nausea medication, call the clinic.   BELOW ARE SYMPTOMS THAT SHOULD BE REPORTED IMMEDIATELY:  *FEVER GREATER THAN 100.5 F  *CHILLS WITH OR WITHOUT FEVER  NAUSEA AND VOMITING THAT IS NOT CONTROLLED WITH YOUR NAUSEA MEDICATION  *UNUSUAL SHORTNESS OF BREATH  *UNUSUAL BRUISING OR BLEEDING  TENDERNESS IN MOUTH AND THROAT WITH OR WITHOUT PRESENCE OF ULCERS  *URINARY PROBLEMS  *BOWEL PROBLEMS  UNUSUAL RASH Items with * indicate a potential emergency and should be followed up as soon as possible.  Feel free to call the clinic should you have any questions or concerns. The clinic phone number is [phone].  Please show the [facility] at check-in to the Emergency Department and triage nurse.  [facility] Discharge Instructions for Patients receiving Home Portable Chemo Pump   **The bag should finish at 46 hours, 96 hours or 7 days. For example, if your pump is scheduled for 46 hours and it was put on at 4pm, it should finish at 2 pm the day it is scheduled to come off regardless of your appointment time.    Estimated time to finish   _________________________ (Have your nurse fill in)     ** if the display on your pump reads "Low Volume" and it is beeping, take the batteries out of the pump and come to the cancer center for it to be taken off.   **If the pump alarms go off prior to the pump reading "Low Volume" then call the [phone] and someone can assist you.  **If the plunger comes out and the bag fluid is running out, please use your chemo spill kit to clean  up the spill. Do not use paper towels or other house hold products.  ** If you have problems or questions regarding your pump, please call either the [phone] or the cancer center Monday-Friday 8:00am-4:30pm at [phone] and we will assist you.  If you are unable to get assistance then go to [facility] Emergency Room, ask the staff to contact the IV team for assistance.    Oxaliplatin Injection What is this medicine? OXALIPLATIN (ox AL i PLA tin) is a chemotherapy drug. It targets fast dividing cells, like cancer cells, and causes these cells to die. This medicine is used to treat cancers of the colon and rectum, and many other cancers. This medicine may be used for other purposes; ask your health care provider or pharmacist if you have questions. COMMON BRAND NAME(S): Eloxatin What should I tell my health care provider before I take this medicine? They need to know if you have any of these conditions: -kidney disease -an unusual or allergic reaction to oxaliplatin, other chemotherapy, other medicines, foods, dyes, or preservatives -pregnant or trying to get pregnant -breast-feeding How should I use this medicine? This drug is given as an infusion into a vein. It is administered in a hospital or clinic by a specially trained health care professional. Talk to your pediatrician regarding the use of this medicine in children. Special care may be needed. Overdosage: If you think you have taken too much of this medicine contact a poison control center or emergency room  at once. NOTE: This medicine is only for you. Do not share this medicine with others. What if I miss a dose? It is important not to miss a dose. Call your doctor or health care professional if you are unable to keep an appointment. What may interact with this medicine? -medicines to increase blood counts like filgrastim, pegfilgrastim, sargramostim -probenecid -some antibiotics like amikacin, gentamicin,  neomycin, polymyxin B, streptomycin, tobramycin -zalcitabine Talk to your doctor or health care professional before taking any of these medicines: -acetaminophen -aspirin -ibuprofen -ketoprofen -naproxen This list may not describe all possible interactions. Give your health care provider a list of all the medicines, herbs, non-prescription drugs, or dietary supplements you use. Also tell them if you smoke, drink alcohol, or use illegal drugs. Some items may interact with your medicine. What should I watch for while using this medicine? Your condition will be monitored carefully while you are receiving this medicine. You will need important blood work done while you are taking this medicine. This medicine can make you more sensitive to cold. Do not drink cold drinks or use ice. Cover exposed skin before coming in contact with cold temperatures or cold objects. When out in cold weather wear warm clothing and cover your mouth and nose to warm the air that goes into your lungs. Tell your doctor if you get sensitive to the cold. This drug may make you feel generally unwell. This is not uncommon, as chemotherapy can affect healthy cells as well as cancer cells. Report any side effects. Continue your course of treatment even though you feel ill unless your doctor tells you to stop. In some cases, you may be given additional medicines to help with side effects. Follow all directions for their use. Call your doctor or health care professional for advice if you get a fever, chills or sore throat, or other symptoms of a cold or flu. Do not treat yourself. This drug decreases your body's ability to fight infections. Try to avoid being around people who are sick. This medicine may increase your risk to bruise or bleed. Call your doctor or health care professional if you notice any unusual bleeding. Be careful brushing and flossing your teeth or using a toothpick because you may get an infection or bleed more  easily. If you have any dental work done, tell your dentist you are receiving this medicine. Avoid taking products that contain aspirin, acetaminophen, ibuprofen, naproxen, or ketoprofen unless instructed by your doctor. These medicines may hide a fever. Do not become pregnant while taking this medicine. Women should inform their doctor if they wish to become pregnant or think they might be pregnant. There is a potential for serious side effects to an unborn child. Talk to your health care professional or pharmacist for more information. Do not breast-feed an infant while taking this medicine. Call your doctor or health care professional if you get diarrhea. Do not treat yourself. What side effects may I notice from receiving this medicine? Side effects that you should report to your doctor or health care professional as soon as possible: -allergic reactions like skin rash, itching or hives, swelling of the face, lips, or tongue -low blood counts - This drug may decrease the number of white blood cells, red blood cells and platelets. You may be at increased risk for infections and bleeding. -signs of infection - fever or chills, cough, sore throat, pain or difficulty passing urine -signs of decreased platelets or bleeding - bruising, pinpoint red spots on the  skin, black, tarry stools, nosebleeds -signs of decreased red blood cells - unusually weak or tired, fainting spells, lightheadedness -breathing problems -chest pain, pressure -cough -diarrhea -jaw tightness -mouth sores -nausea and vomiting -pain, swelling, redness or irritation at the injection site -pain, tingling, numbness in the hands or feet -problems with balance, talking, walking -redness, blistering, peeling or loosening of the skin, including inside the mouth -trouble passing urine or change in the amount of urine Side effects that usually do not require medical attention (report to your doctor or health care professional if  they continue or are bothersome): -changes in vision -constipation -hair loss -loss of appetite -metallic taste in the mouth or changes in taste -stomach pain This list may not describe all possible side effects. Call your doctor for medical advice about side effects. You may report side effects to FDA at 1-800-FDA-1088. Where should I keep my medicine? This drug is given in a hospital or clinic and will not be stored at home. NOTE: This sheet is a summary. It may not cover all possible information. If you have questions about this medicine, talk to your doctor, pharmacist, or health care provider.  2019 Elsevier/Gold Standard (2007-11-27 17:22:47)  Leucovorin injection What is this medicine? LEUCOVORIN (loo koe VOR in) is used to prevent or treat the harmful effects of some medicines. This medicine is used to treat anemia caused by a low amount of folic acid in the body. It is also used with 5-fluorouracil (5-FU) to treat colon cancer. This medicine may be used for other purposes; ask your health care provider or pharmacist if you have questions. What should I tell my health care provider before I take this medicine? They need to know if you have any of these conditions: -anemia from low levels of vitamin B-12 in the blood -an unusual or allergic reaction to leucovorin, folic acid, other medicines, foods, dyes, or preservatives -pregnant or trying to get pregnant -breast-feeding How should I use this medicine? This medicine is for injection into a muscle or into a vein. It is given by a health care professional in a hospital or clinic setting. Talk to your pediatrician regarding the use of this medicine in children. Special care may be needed. Overdosage: If you think you have taken too much of this medicine contact a poison control center or emergency room at once. NOTE: This medicine is only for you. Do not share this medicine with others. What if I miss a dose? This does not  apply. What may interact with this medicine? -capecitabine -fluorouracil -phenobarbital -phenytoin -primidone -trimethoprim-sulfamethoxazole This list may not describe all possible interactions. Give your health care provider a list of all the medicines, herbs, non-prescription drugs, or dietary supplements you use. Also tell them if you smoke, drink alcohol, or use illegal drugs. Some items may interact with your medicine. What should I watch for while using this medicine? Your condition will be monitored carefully while you are receiving this medicine. This medicine may increase the side effects of 5-fluorouracil, 5-FU. Tell your doctor or health care professional if you have diarrhea or mouth sores that do not get better or that get worse. What side effects may I notice from receiving this medicine? Side effects that you should report to your doctor or health care professional as soon as possible: -allergic reactions like skin rash, itching or hives, swelling of the face, lips, or tongue -breathing problems -fever, infection -mouth sores -unusual bleeding or bruising -unusually weak or tired Side effects that  usually do not require medical attention (report to your doctor or health care professional if they continue or are bothersome): -constipation or diarrhea -loss of appetite -nausea, vomiting This list may not describe all possible side effects. Call your doctor for medical advice about side effects. You may report side effects to FDA at 1-800-FDA-1088. Where should I keep my medicine? This drug is given in a hospital or clinic and will not be stored at home. NOTE: This sheet is a summary. It may not cover all possible information. If you have questions about this medicine, talk to your doctor, pharmacist, or health care provider.  2019 Elsevier/Gold Standard (2007-11-06 16:50:29)  Fluorouracil, 5-FU injection What is this medicine? FLUOROURACIL, 5-FU (flure oh YOOR a sil) is  a chemotherapy drug. It slows the growth of cancer cells. This medicine is used to treat many types of cancer like breast cancer, colon or rectal cancer, pancreatic cancer, and stomach cancer. This medicine may be used for other purposes; ask your health care provider or pharmacist if you have questions. COMMON BRAND NAME(S): Adrucil What should I tell my health care provider before I take this medicine? They need to know if you have any of these conditions: -blood disorders -dihydropyrimidine dehydrogenase (DPD) deficiency -infection (especially a virus infection such as chickenpox, cold sores, or herpes) -kidney disease -liver disease -malnourished, poor nutrition -recent or ongoing radiation therapy -an unusual or allergic reaction to fluorouracil, other chemotherapy, other medicines, foods, dyes, or preservatives -pregnant or trying to get pregnant -breast-feeding How should I use this medicine? This drug is given as an infusion or injection into a vein. It is administered in a hospital or clinic by a specially trained health care professional. Talk to your pediatrician regarding the use of this medicine in children. Special care may be needed. Overdosage: If you think you have taken too much of this medicine contact a poison control center or emergency room at once. NOTE: This medicine is only for you. Do not share this medicine with others. What if I miss a dose? It is important not to miss your dose. Call your doctor or health care professional if you are unable to keep an appointment. What may interact with this medicine? -allopurinol -cimetidine -dapsone -digoxin -hydroxyurea -leucovorin -levamisole -medicines for seizures like ethotoin, fosphenytoin, phenytoin -medicines to increase blood counts like filgrastim, pegfilgrastim, sargramostim -medicines that treat or prevent blood clots like warfarin, enoxaparin, and  dalteparin -methotrexate -metronidazole -pyrimethamine -some other chemotherapy drugs like busulfan, cisplatin, estramustine, vinblastine -trimethoprim -trimetrexate -vaccines Talk to your doctor or health care professional before taking any of these medicines: -acetaminophen -aspirin -ibuprofen -ketoprofen -naproxen This list may not describe all possible interactions. Give your health care provider a list of all the medicines, herbs, non-prescription drugs, or dietary supplements you use. Also tell them if you smoke, drink alcohol, or use illegal drugs. Some items may interact with your medicine. What should I watch for while using this medicine? Visit your doctor for checks on your progress. This drug may make you feel generally unwell. This is not uncommon, as chemotherapy can affect healthy cells as well as cancer cells. Report any side effects. Continue your course of treatment even though you feel ill unless your doctor tells you to stop. In some cases, you may be given additional medicines to help with side effects. Follow all directions for their use. Call your doctor or health care professional for advice if you get a fever, chills or sore throat, or other symptoms of  a cold or flu. Do not treat yourself. This drug decreases your body's ability to fight infections. Try to avoid being around people who are sick. This medicine may increase your risk to bruise or bleed. Call your doctor or health care professional if you notice any unusual bleeding. Be careful brushing and flossing your teeth or using a toothpick because you may get an infection or bleed more easily. If you have any dental work done, tell your dentist you are receiving this medicine. Avoid taking products that contain aspirin, acetaminophen, ibuprofen, naproxen, or ketoprofen unless instructed by your doctor. These medicines may hide a fever. Do not become pregnant while taking this medicine. Women should inform their  doctor if they wish to become pregnant or think they might be pregnant. There is a potential for serious side effects to an unborn child. Talk to your health care professional or pharmacist for more information. Do not breast-feed an infant while taking this medicine. Men should inform their doctor if they wish to father a child. This medicine may lower sperm counts. Do not treat diarrhea with over the counter products. Contact your doctor if you have diarrhea that lasts more than 2 days or if it is severe and watery. This medicine can make you more sensitive to the sun. Keep out of the sun. If you cannot avoid being in the sun, wear protective clothing and use sunscreen. Do not use sun lamps or tanning beds/booths. What side effects may I notice from receiving this medicine? Side effects that you should report to your doctor or health care professional as soon as possible: -allergic reactions like skin rash, itching or hives, swelling of the face, lips, or tongue -low blood counts - this medicine may decrease the number of white blood cells, red blood cells and platelets. You may be at increased risk for infections and bleeding. -signs of infection - fever or chills, cough, sore throat, pain or difficulty passing urine -signs of decreased platelets or bleeding - bruising, pinpoint red spots on the skin, black, tarry stools, blood in the urine -signs of decreased red blood cells - unusually weak or tired, fainting spells, lightheadedness -breathing problems -changes in vision -chest pain -mouth sores -nausea and vomiting -pain, swelling, redness at site where injected -pain, tingling, numbness in the hands or feet -redness, swelling, or sores on hands or feet -stomach pain -unusual bleeding Side effects that usually do not require medical attention (report to your doctor or health care professional if they continue or are bothersome): -changes in finger or toe nails -diarrhea -dry or itchy  skin -hair loss -headache -loss of appetite -sensitivity of eyes to the light -stomach upset -unusually teary eyes This list may not describe all possible side effects. Call your doctor for medical advice about side effects. You may report side effects to FDA at 1-800-FDA-1088. Where should I keep my medicine? This drug is given in a hospital or clinic and will not be stored at home. NOTE: This sheet is a summary. It may not cover all possible information. If you have questions about this medicine, talk to your doctor, pharmacist, or health care provider.  2019 Elsevier/Gold Standard (2007-09-05 13:53:16)

## 2018-07-17 NOTE — Progress Notes (Addendum)
[facility] OFFICE PROGRESS NOTE   Diagnosis: Small bowel carcinoma  INTERVAL HISTORY:   Hailey Houston returns as scheduled.  She completed cycle 1 FOLFOX 07/03/2018.  She had nausea beginning day 1.  She had an episode of nausea/vomiting on day 2.  She is unable to recall which antiemetic medication she took.  No mouth sores.  She had diarrhea lasting for about 3 days.  She was taking MiraLAX and Colace during this time.  No hand or foot pain.  She notes some redness over the dorsal aspect of both hands.  Rash at the torso has recurred and is pruritic.  Similar to when she was in the hospital.  She notes an overall increase in upper abdominal pain.  She is on a Duragesic patch and takes Dilaudid about every 6 hours.  Objective:  Vital signs in last 24 hours:  Blood pressure 117/68, pulse 100, temperature 98.6 F (37 C), temperature source Oral, resp. rate 18, height 5' 11"  (1.803 m), weight 225 lb 9.6 oz (102.3 kg), SpO2 97 %.    HEENT: No thrush or ulcers. Resp: Lungs clear bilaterally. Cardio: Regular rate and rhythm. GI: Abdomen soft and nontender.  No hepatomegaly. Vascular: No leg edema.  Skin: Erythematous macular rash at the low back and abdomen. Port-A-Cath without erythema.   Lab Results:  Lab Results  Component Value Date   WBC 3.1 (L) 07/17/2018   HGB 9.1 (L) 07/17/2018   HCT 28.3 (L) 07/17/2018   MCV 90.4 07/17/2018   PLT 200 07/17/2018   NEUTROABS 1.6 (L) 07/17/2018    Imaging:  No results found.  Medications: I have reviewed the patient's current medications.  Assessment/Plan: 1.Adenocarcinoma of the duodenum  CT abdomen/pelvis 06/16/2018-changes of pancreatitis, fullness at the head of the pancreas without a defined mass, multiple subcentimeter peripancreatic lymph nodes, distal common bile duct stent, multiple nodules at the lung bases  EUS 06/14/2018-ulcerated mucosa/mass at the distal duodenal bulb with extension to the pancreas head,  portal adenopathy, biopsy of duodenal mass-adenocarcinoma; foundation 1-microsatellite stable, tumor mutational burden 1, KRAS G12D mutation  ERCP 06/14/2018-distal duodenal bulb mass, 1 cm proximal to the ampulla causing biliary obstruction, status post placement of a metal common bile duct stent  CT chest 06/20/2018- bilateral pulmonary nodules consistent with metastatic disease, supraclavicular, mediastinal, and hilar lymphadenopathy, changes of pancreatitis  Ultrasound-guided biopsy of a left supraclavicular lymph node 06/22/2018-metastatic adenocarcinoma  Cycle 1 FOLFOX 07/03/2018  Cycle 2 FOLFOX 07/17/2018  2.Biliary obstruction secondary to the duodenal mass, status post placement of a bile duct stent 06/14/2018 3.Post ERCP pancreatitis 06/16/2018 4.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 Abdominal pain- secondary to the small bowel carcinoma versus pancreatitis  9. Rash-improved; recurrent 07/17/2018.  She continues Atarax.   Disposition: Hailey Houston appears stable.  She has completed 1 cycle of FOLFOX.  Plan to proceed with cycle 2 today as scheduled.  She had delayed nausea.  We will adjust the premedication regimen to include Emend.  She will take Compazine and Ativan as needed initially.  She understands she can begin Zofran as needed after 72 hours.  We reviewed the CBC from today.  She has mild neutropenia.  She understands to contact the office with fever, chills, other signs of infection.  She continues to have upper abdominal pain.  She is on a Duragesic patch and takes  Dilaudid about every 6 hours.  We will send a new Duragesic prescription to her pharmacy.  We made a referral to the genetics counselor.  She will return for lab, follow-up and cycle 3 FOLFOX in 2 weeks.  She will contact  the office in the interim with any problems.  Patient seen with Dr. Benay Spice.  25 minutes were spent face-to-face at today's visit with the majority of that time involved in counseling/coordination of care.    Ned Card ANP/GNP-BC   07/17/2018  11:08 AM This was a shared visit with Ned Card.  Hailey Houston was interviewed and examined.  Cycle 1 FOLFOX was complicated by nausea and vomiting.  We adjusted the antiemetic regimen with cycle 2. She has stable abdominal pain.  She will continue the Duragesic patch and Dilaudid.  The etiology of the pruritic rash is unclear.  She had a rash prior to beginning chemotherapy.  The rash is most likely related to polypharmacy or a paraneoplastic phenomena.  Atarax relieves the pruritus.  Julieanne Manson, MD

## 2018-07-19 ENCOUNTER — Inpatient Hospital Stay: Payer: 59 | Admitting: General Practice

## 2018-07-19 ENCOUNTER — Telehealth: Payer: Self-pay | Admitting: Oncology

## 2018-07-19 ENCOUNTER — Encounter (HOSPITAL_COMMUNITY): Payer: Self-pay | Admitting: Oncology

## 2018-07-19 ENCOUNTER — Inpatient Hospital Stay: Payer: 59

## 2018-07-19 ENCOUNTER — Other Ambulatory Visit: Payer: Self-pay | Admitting: Oncology

## 2018-07-19 VITALS — BP 122/63 | HR 80 | Temp 98.1°F | Resp 18

## 2018-07-19 DIAGNOSIS — C170 Malignant neoplasm of duodenum: Secondary | ICD-10-CM

## 2018-07-19 MED ORDER — HEPARIN SOD (PORK) LOCK FLUSH 100 UNIT/ML IV SOLN
500.0000 [IU] | Freq: Once | INTRAVENOUS | Status: AC | PRN
  Administered 2018-07-19: 500 [IU]
  Filled 2018-07-19: qty 5

## 2018-07-19 MED ORDER — SODIUM CHLORIDE 0.9% FLUSH
10.0000 mL | INTRAVENOUS | Status: DC | PRN
  Administered 2018-07-19: 10 mL
  Filled 2018-07-19: qty 10

## 2018-07-19 NOTE — Progress Notes (Signed)
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  Patient wants to work towards planning trip to nearby location w children, discussed ways to make this feasible.  Also worked on connecting her w resources to support her journey as a parent facing cancer.  Will connect at next visit.  Edwyna Shell, LCSW Clinical Social Worker Phone:  [phone]

## 2018-07-19 NOTE — Telephone Encounter (Signed)
Called pt - unable to reach pt or leave message.  - pt to get an updated schedule next visit.

## 2018-07-23 ENCOUNTER — Other Ambulatory Visit: Payer: Self-pay | Admitting: Oncology

## 2018-07-23 ENCOUNTER — Telehealth: Payer: Self-pay | Admitting: *Deleted

## 2018-07-23 DIAGNOSIS — K858 Other acute pancreatitis without necrosis or infection: Secondary | ICD-10-CM

## 2018-07-23 MED ORDER — HYDROXYZINE HCL 50 MG PO TABS: 50.0000 mg | ORAL_TABLET | Freq: Three times a day (TID) | ORAL | 1 refills | Status: DC | PRN

## 2018-07-23 MED ORDER — DEXAMETHASONE 4 MG PO TABS: 4.0000 mg | ORAL_TABLET | Freq: Two times a day (BID) | ORAL | 1 refills | Status: DC

## 2018-07-23 MED ORDER — PANTOPRAZOLE SODIUM 40 MG PO TBEC: 40.0000 mg | DELAYED_RELEASE_TABLET | Freq: Two times a day (BID) | ORAL | 1 refills | Status: DC

## 2018-07-23 MED ORDER — FAMOTIDINE 20 MG PO TABS: 20.0000 mg | ORAL_TABLET | Freq: Two times a day (BID) | ORAL | 1 refills | Status: DC

## 2018-07-23 MED ORDER — HYDROMORPHONE HCL 4 MG PO TABS: 4.0000 mg | ORAL_TABLET | ORAL | 0 refills | Status: DC | PRN

## 2018-07-23 NOTE — Telephone Encounter (Signed)
Called patient back with directions for Decadron 4 mg twice daily x 3 days after each chemo. Usually start day after chemo, but begin today for this cycle. Confirmed refills she needed and informed her we need to wait for MD to refill the narcotic.

## 2018-07-23 NOTE — Telephone Encounter (Signed)
Reports persistent nausea, rated "6" since last week despite using Zofran, Ativan, Compazine as prescribed. She did have Aloxi and Emend with last treatment. No vomiting. Drinks about 32 ounces fluid/day and occasional "bites" of soup or grits (bland foods). Also takes Pepcid and Protonix twice daily. Inquires if anything else to help nausea? Also reports her upper abdominal pain persists at a "5" and she feels it correlates to her nausea. Reports bowels are soft and move daily. Requesting refills: Hydromorphone, Protonix, Pepcid, and Atarax (itching)

## 2018-07-29 ENCOUNTER — Other Ambulatory Visit: Payer: Self-pay | Admitting: Oncology

## 2018-07-31 ENCOUNTER — Telehealth: Payer: Self-pay

## 2018-07-31 ENCOUNTER — Inpatient Hospital Stay: Payer: 59

## 2018-07-31 ENCOUNTER — Encounter: Payer: Self-pay | Admitting: Nurse Practitioner

## 2018-07-31 ENCOUNTER — Telehealth: Payer: Self-pay | Admitting: Genetic Counselor

## 2018-07-31 ENCOUNTER — Other Ambulatory Visit: Payer: Self-pay

## 2018-07-31 ENCOUNTER — Inpatient Hospital Stay (HOSPITAL_BASED_OUTPATIENT_CLINIC_OR_DEPARTMENT_OTHER): Payer: 59 | Admitting: Nurse Practitioner

## 2018-07-31 ENCOUNTER — Other Ambulatory Visit: Payer: Self-pay | Admitting: Nurse Practitioner

## 2018-07-31 VITALS — BP 115/72 | HR 91 | Temp 99.6°F | Resp 18 | Ht 71.0 in | Wt 219.3 lb

## 2018-07-31 DIAGNOSIS — C170 Malignant neoplasm of duodenum: Secondary | ICD-10-CM

## 2018-07-31 DIAGNOSIS — Z95828 Presence of other vascular implants and grafts: Secondary | ICD-10-CM

## 2018-07-31 DIAGNOSIS — R21 Rash and other nonspecific skin eruption: Secondary | ICD-10-CM

## 2018-07-31 DIAGNOSIS — Z79899 Other long term (current) drug therapy: Secondary | ICD-10-CM

## 2018-07-31 DIAGNOSIS — D649 Anemia, unspecified: Secondary | ICD-10-CM | POA: Diagnosis not present

## 2018-07-31 DIAGNOSIS — K831 Obstruction of bile duct: Secondary | ICD-10-CM | POA: Diagnosis not present

## 2018-07-31 LAB — CBC WITH DIFFERENTIAL (CANCER CENTER ONLY)
Abs Immature Granulocytes: 0.01 10*3/uL (ref 0.00–0.07)
BASOS PCT: 1 %
Basophils Absolute: 0 10*3/uL (ref 0.0–0.1)
Eosinophils Absolute: 0 10*3/uL (ref 0.0–0.5)
Eosinophils Relative: 1 %
HCT: 28.5 % — ABNORMAL LOW (ref 36.0–46.0)
HEMOGLOBIN: 9.1 g/dL — AB (ref 12.0–15.0)
Immature Granulocytes: 1 %
Lymphocytes Relative: 45 %
Lymphs Abs: 0.7 10*3/uL (ref 0.7–4.0)
MCH: 28 pg (ref 26.0–34.0)
MCHC: 31.9 g/dL (ref 30.0–36.0)
MCV: 87.7 fL (ref 80.0–100.0)
Monocytes Absolute: 0.6 10*3/uL (ref 0.1–1.0)
Monocytes Relative: 38 %
Neutro Abs: 0.2 10*3/uL — CL (ref 1.7–7.7)
Neutrophils Relative %: 14 %
Platelet Count: 214 10*3/uL (ref 150–400)
RBC: 3.25 MIL/uL — ABNORMAL LOW (ref 3.87–5.11)
RDW: 14.4 % (ref 11.5–15.5)
WBC Count: 1.6 10*3/uL — ABNORMAL LOW (ref 4.0–10.5)
nRBC: 0 % (ref 0.0–0.2)

## 2018-07-31 LAB — CMP (CANCER CENTER ONLY)
ALT: 72 U/L — ABNORMAL HIGH (ref 0–44)
ANION GAP: 12 (ref 5–15)
AST: 73 U/L — ABNORMAL HIGH (ref 15–41)
Albumin: 2.9 g/dL — ABNORMAL LOW (ref 3.5–5.0)
Alkaline Phosphatase: 165 U/L — ABNORMAL HIGH (ref 38–126)
BILIRUBIN TOTAL: 0.9 mg/dL (ref 0.3–1.2)
BUN: 10 mg/dL (ref 6–20)
CO2: 25 mmol/L (ref 22–32)
Calcium: 8.5 mg/dL — ABNORMAL LOW (ref 8.9–10.3)
Chloride: 100 mmol/L (ref 98–111)
Creatinine: 0.66 mg/dL (ref 0.44–1.00)
GFR, Est AFR Am: >60 mL/min (ref >60)
GFR, Estimated: >60 mL/min (ref >60)
Glucose, Bld: 117 mg/dL — ABNORMAL HIGH (ref 70–99)
Potassium: 2.9 mmol/L — CL (ref 3.5–5.1)
Sodium: 137 mmol/L (ref 135–145)
Total Protein: 6.3 g/dL — ABNORMAL LOW (ref 6.5–8.1)

## 2018-07-31 MED ORDER — FENTANYL 75 MCG/HR TD PT72: 1.0000 | MEDICATED_PATCH | TRANSDERMAL | 0 refills | Status: DC

## 2018-07-31 MED ORDER — POTASSIUM CHLORIDE CRYS ER 20 MEQ PO TBCR: EXTENDED_RELEASE_TABLET | ORAL | 1 refills | Status: DC

## 2018-07-31 MED ORDER — LEVOFLOXACIN 500 MG PO TABS: 500.0000 mg | ORAL_TABLET | Freq: Every day | ORAL | 0 refills | Status: DC

## 2018-07-31 MED ORDER — HEPARIN SOD (PORK) LOCK FLUSH 100 UNIT/ML IV SOLN
500.0000 [IU] | Freq: Once | INTRAVENOUS | Status: AC
  Administered 2018-07-31: 500 [IU]
  Filled 2018-07-31: qty 5

## 2018-07-31 MED ORDER — SODIUM CHLORIDE 0.9% FLUSH
10.0000 mL | Freq: Once | INTRAVENOUS | Status: AC
  Administered 2018-07-31: 10 mL
  Filled 2018-07-31: qty 10

## 2018-07-31 NOTE — Telephone Encounter (Signed)
critical lab received from lab. Potassium 2.9 Lattie Haw aware

## 2018-07-31 NOTE — Progress Notes (Signed)
[facility] OFFICE PROGRESS NOTE   Diagnosis: Small bowel carcinoma  INTERVAL HISTORY:   Hailey Houston returns as scheduled.  She completed cycle 2 FOLFOX 07/17/2018.  She developed nausea beginning day 1 and lasting for more than 10 days.  She took dexamethasone for 3 days and noted improvement.  No mouth sores.  No diarrhea.  She has noted some skin changes at the thumbs bilaterally.  She had significant cold sensitivity which has persisted.  She has intermittent numbness in the hands and feet.  She fell on her stairs yesterday and wonders if this was related to neuropathy.  She reports overall improvement in pain management.  She recently noted pain when laying on her right side.  No fever, cough, shortness of breath.  Objective:  Vital signs in last 24 hours:  Blood pressure 115/72, pulse 91, temperature 99.6 F (37.6 C), temperature source Oral, resp. rate 18, height 5' 11"  (1.803 m), weight 219 lb 4.8 oz (99.5 kg), SpO2 95 %.    HEENT: No thrush or ulcers. Resp: Lungs clear bilaterally. Cardio: Regular rate and rhythm. GI: Abdomen soft and nontender.  No hepatomegaly. Vascular: No leg edema. Neuro: Vibratory sense intact over the fingertips per tuning fork exam. Skin: Thumbs with dryness bilaterally.  Palms with mild erythema. Port-A-Cath without erythema.   Lab Results:  Lab Results  Component Value Date   WBC 1.6 (L) 07/31/2018   HGB 9.1 (L) 07/31/2018   HCT 28.5 (L) 07/31/2018   MCV 87.7 07/31/2018   PLT 214 07/31/2018   NEUTROABS 0.2 (LL) 07/31/2018    Imaging:  No results found.  Medications: I have reviewed the patient's current medications.  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 mutation  ERCP 06/14/2018-distal duodenal bulb mass, 1 cm proximal to the ampulla causing biliary obstruction, status post placement of a metal common bile duct stent  CT chest 06/20/2018-bilateral pulmonary nodules consistent with metastatic disease, supraclavicular, mediastinal, and hilar lymphadenopathy, changes of pancreatitis  Ultrasound-guided biopsy of a left supraclavicular lymph node 06/22/2018-metastatic adenocarcinoma  Cycle 1 FOLFOX 07/03/2018  Cycle 2 FOLFOX 07/17/2018  2.Biliary obstruction secondary to the duodenal mass, status post placement of a bile duct stent 06/14/2018 3.Post ERCP pancreatitis 06/16/2018 4.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 Abdominal pain- secondary to the small bowel carcinoma versus pancreatitis  9. Rash-improved; recurrent 07/17/2018.  She continues Atarax.   Disposition: Ms. Garbers appears stable.  She has completed 2 cycles of FOLFOX.  We reviewed the CBC from today.  She has significant neutropenia.  We are holding today's treatment.  Neutropenic precautions reviewed.  She understands to contact the office with fever, chills, other signs of infection.  She will begin Levaquin prophylaxis.  We discussed adding Udenyca on the day of pump discontinuation beginning with cycle 3.  Potential toxicities reviewed including bone pain, rash, splenic rupture.  She agrees with this plan.  She has hypokalemia on labs today.  A potassium supplement has been sent to her pharmacy.  She  will return for lab, follow-up and cycle 3 FOLFOX in 1 week.  She  will contact the office in the interim as outlined above or with any other problems.  Plan reviewed with Dr. Benay Spice.  25 minutes were spent face-to-face at today's visit with the majority of that time involved in counseling/coordination of care.    Ned Card ANP/GNP-BC   07/31/2018  11:56 AM

## 2018-07-31 NOTE — Telephone Encounter (Signed)
LM on VM that we need to r/s appointment.  Asked that she please call back.

## 2018-07-31 NOTE — Telephone Encounter (Signed)
Critical lab call received from lab. Absolute neutrophil count 0.2 lisa aware

## 2018-08-01 NOTE — Telephone Encounter (Signed)
Scheduled patient for May 21 at 1 PM with [facility].

## 2018-08-02 ENCOUNTER — Inpatient Hospital Stay: Payer: 59 | Admitting: General Practice

## 2018-08-02 ENCOUNTER — Inpatient Hospital Stay: Payer: 59 | Admitting: Genetic Counselor

## 2018-08-03 ENCOUNTER — Telehealth: Payer: Self-pay | Admitting: Oncology

## 2018-08-03 ENCOUNTER — Other Ambulatory Visit: Payer: Self-pay | Admitting: Nurse Practitioner

## 2018-08-03 DIAGNOSIS — K858 Other acute pancreatitis without necrosis or infection: Secondary | ICD-10-CM

## 2018-08-03 MED ORDER — HYDROMORPHONE HCL 4 MG PO TABS: 4.0000 mg | ORAL_TABLET | ORAL | 0 refills | Status: DC | PRN

## 2018-08-03 NOTE — Telephone Encounter (Signed)
Spoke with patient confirming appointments for 3/23 and 3/25. No availability to coordinate lab/port/fu/tx same day on 3/24. Other appointments after 3/23 cxd due to delay from 3/17 to 3/24. Additional orders will be provided at 3/23 visit.

## 2018-08-06 ENCOUNTER — Inpatient Hospital Stay: Payer: 59

## 2018-08-06 ENCOUNTER — Inpatient Hospital Stay (HOSPITAL_BASED_OUTPATIENT_CLINIC_OR_DEPARTMENT_OTHER): Payer: 59 | Admitting: Nurse Practitioner

## 2018-08-06 ENCOUNTER — Encounter: Payer: Self-pay | Admitting: Nurse Practitioner

## 2018-08-06 ENCOUNTER — Other Ambulatory Visit: Payer: Self-pay

## 2018-08-06 VITALS — BP 110/74 | HR 94 | Temp 98.7°F | Resp 18 | Ht 71.0 in | Wt 214.7 lb

## 2018-08-06 VITALS — BP 134/82 | HR 74 | Temp 98.4°F | Resp 18

## 2018-08-06 DIAGNOSIS — Z95828 Presence of other vascular implants and grafts: Secondary | ICD-10-CM

## 2018-08-06 DIAGNOSIS — Z79899 Other long term (current) drug therapy: Secondary | ICD-10-CM | POA: Diagnosis not present

## 2018-08-06 DIAGNOSIS — D649 Anemia, unspecified: Secondary | ICD-10-CM

## 2018-08-06 DIAGNOSIS — C170 Malignant neoplasm of duodenum: Secondary | ICD-10-CM

## 2018-08-06 LAB — CBC WITH DIFFERENTIAL (CANCER CENTER ONLY)
Abs Immature Granulocytes: 0.99 10*3/uL — ABNORMAL HIGH (ref 0.00–0.07)
Basophils Absolute: 0 10*3/uL (ref 0.0–0.1)
Basophils Relative: 1 %
EOS ABS: 0.1 10*3/uL (ref 0.0–0.5)
Eosinophils Relative: 1 %
HCT: 31.5 % — ABNORMAL LOW (ref 36.0–46.0)
Hemoglobin: 9.9 g/dL — ABNORMAL LOW (ref 12.0–15.0)
IMMATURE GRANULOCYTES: 12 %
LYMPHS ABS: 2 10*3/uL (ref 0.7–4.0)
Lymphocytes Relative: 23 %
MCH: 27.7 pg (ref 26.0–34.0)
MCHC: 31.4 g/dL (ref 30.0–36.0)
MCV: 88.2 fL (ref 80.0–100.0)
MONOS PCT: 16 %
Monocytes Absolute: 1.4 10*3/uL — ABNORMAL HIGH (ref 0.1–1.0)
Neutro Abs: 4.1 10*3/uL (ref 1.7–7.7)
Neutrophils Relative %: 47 %
Platelet Count: 431 10*3/uL — ABNORMAL HIGH (ref 150–400)
RBC: 3.57 MIL/uL — ABNORMAL LOW (ref 3.87–5.11)
RDW: 15.5 % (ref 11.5–15.5)
WBC Count: 8.5 10*3/uL (ref 4.0–10.5)
nRBC: 0.4 % — ABNORMAL HIGH (ref 0.0–0.2)

## 2018-08-06 LAB — CMP (CANCER CENTER ONLY)
ALT: 34 U/L (ref 0–44)
AST: 32 U/L (ref 15–41)
Albumin: 2.8 g/dL — ABNORMAL LOW (ref 3.5–5.0)
Alkaline Phosphatase: 161 U/L — ABNORMAL HIGH (ref 38–126)
Anion gap: 13 (ref 5–15)
BUN: 10 mg/dL (ref 6–20)
CO2: 25 mmol/L (ref 22–32)
Calcium: 8.9 mg/dL (ref 8.9–10.3)
Chloride: 102 mmol/L (ref 98–111)
Creatinine: 0.7 mg/dL (ref 0.44–1.00)
GFR, Est AFR Am: >60 mL/min (ref >60)
GFR, Estimated: >60 mL/min (ref >60)
Glucose, Bld: 106 mg/dL — ABNORMAL HIGH (ref 70–99)
POTASSIUM: 3.7 mmol/L (ref 3.5–5.1)
Sodium: 140 mmol/L (ref 135–145)
Total Bilirubin: 0.4 mg/dL (ref 0.3–1.2)
Total Protein: 6.7 g/dL (ref 6.5–8.1)

## 2018-08-06 LAB — MAGNESIUM: Magnesium: 2 mg/dL (ref 1.7–2.4)

## 2018-08-06 MED ORDER — DEXTROSE 5 % IV SOLN
Freq: Once | INTRAVENOUS | Status: AC
  Administered 2018-08-06: 12:00:00 via INTRAVENOUS
  Filled 2018-08-06: qty 250

## 2018-08-06 MED ORDER — SODIUM CHLORIDE 0.9% FLUSH
10.0000 mL | INTRAVENOUS | Status: DC | PRN
  Administered 2018-08-06: 10 mL
  Filled 2018-08-06: qty 10

## 2018-08-06 MED ORDER — SODIUM CHLORIDE 0.9 % IV SOLN
2400.0000 mg/m2 | INTRAVENOUS | Status: DC
  Administered 2018-08-06: 5450 mg via INTRAVENOUS
  Filled 2018-08-06: qty 109

## 2018-08-06 MED ORDER — PALONOSETRON HCL INJECTION 0.25 MG/5ML
0.2500 mg | Freq: Once | INTRAVENOUS | Status: AC
  Administered 2018-08-06: 0.25 mg via INTRAVENOUS

## 2018-08-06 MED ORDER — FLUOROURACIL CHEMO INJECTION 2.5 GM/50ML
400.0000 mg/m2 | Freq: Once | INTRAVENOUS | Status: AC
  Administered 2018-08-06: 900 mg via INTRAVENOUS
  Filled 2018-08-06: qty 18

## 2018-08-06 MED ORDER — HEPARIN SOD (PORK) LOCK FLUSH 100 UNIT/ML IV SOLN
500.0000 [IU] | Freq: Once | INTRAVENOUS | Status: DC | PRN
  Filled 2018-08-06: qty 5

## 2018-08-06 MED ORDER — SODIUM CHLORIDE 0.9% FLUSH
10.0000 mL | Freq: Once | INTRAVENOUS | Status: AC
  Administered 2018-08-06: 10 mL
  Filled 2018-08-06: qty 10

## 2018-08-06 MED ORDER — PALONOSETRON HCL INJECTION 0.25 MG/5ML
INTRAVENOUS | Status: AC
  Filled 2018-08-06: qty 5

## 2018-08-06 MED ORDER — DEXTROSE 5 % IV SOLN
Freq: Once | INTRAVENOUS | Status: AC
  Administered 2018-08-06: 11:00:00 via INTRAVENOUS
  Filled 2018-08-06: qty 250

## 2018-08-06 MED ORDER — SODIUM CHLORIDE 0.9 % IV SOLN
Freq: Once | INTRAVENOUS | Status: AC
  Administered 2018-08-06: 12:00:00 via INTRAVENOUS
  Filled 2018-08-06: qty 5

## 2018-08-06 MED ORDER — OXALIPLATIN CHEMO INJECTION 100 MG/20ML
88.0000 mg/m2 | Freq: Once | INTRAVENOUS | Status: AC
  Administered 2018-08-06: 200 mg via INTRAVENOUS
  Filled 2018-08-06: qty 40

## 2018-08-06 MED ORDER — LEUCOVORIN CALCIUM INJECTION 350 MG
400.0000 mg/m2 | Freq: Once | INTRAVENOUS | Status: AC
  Administered 2018-08-06: 908 mg via INTRAVENOUS
  Filled 2018-08-06: qty 45.4

## 2018-08-06 NOTE — Progress Notes (Addendum)
[facility] OFFICE PROGRESS NOTE   Diagnosis: Small bowel carcinoma  INTERVAL HISTORY:   Ms. Nienaber returns as scheduled.  She completed cycle 2 FOLFOX 07/17/2018.  Cycle 3 was held 07/31/2018 due to neutropenia.  She continues to have mild intermittent nausea.  No vomiting.  No mouth sores.  No diarrhea.  No neuropathy symptoms.  Left knee pain continues to improve.  She has not noticed any swelling or other abnormality.  She is able to walk without difficulty.  She continues to have right-sided back pain.  Upper abdominal pain is better.  She is using less Dilaudid.  Appetite is poor.  She reports good fluid intake. Objective:  Vital signs in last 24 hours:  Blood pressure 110/74, pulse 94, temperature 98.7 F (37.1 C), temperature source Oral, resp. rate 18, height '5\' 11"'$  (1.803 m), weight 214 lb 11.2 oz (97.4 kg), SpO2 97 %.    No physical exam performed  Lab Results:  Lab Results  Component Value Date   WBC 8.5 08/06/2018   HGB 9.9 (L) 08/06/2018   HCT 31.5 (L) 08/06/2018   MCV 88.2 08/06/2018   PLT 431 (H) 08/06/2018   NEUTROABS PENDING 08/06/2018    Imaging:  No results found.  Medications: I have reviewed the patient's current medications.  Assessment/Plan: 1.Adenocarcinoma of the duodenum  CT abdomen/pelvis 06/16/2018-changes of pancreatitis, fullness at the head of the pancreas without a defined mass, multiple subcentimeter peripancreatic lymph nodes, distal common bile duct stent, multiple nodules at the lung bases  EUS 06/14/2018-ulcerated mucosa/mass at the distal duodenal bulb with extension to the pancreas head, portal adenopathy, biopsy of duodenal mass-adenocarcinoma;foundation 1-microsatellite stable, tumor mutational burden 1, KRASG12D mutation  ERCP 06/14/2018-distal duodenal bulb mass, 1 cm proximal to the ampulla causing biliary obstruction, status post placement of a metal common bile duct stent  CT chest 06/20/2018-bilateral pulmonary  nodules consistent with metastatic disease, supraclavicular, mediastinal, and hilar lymphadenopathy, changes of pancreatitis  Ultrasound-guided biopsy of a left supraclavicular lymph node 06/22/2018-metastatic adenocarcinoma  Cycle 1 FOLFOX 07/03/2018  Cycle 2 FOLFOX 07/17/2018  Cycle 3 FOLFOX 08/06/2018, Neulasta added  2.Biliary obstruction secondary to the duodenal mass, status post placement of a bile duct stent 06/14/2018 3.Post ERCP pancreatitis 06/16/2018 4.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 Abdominal pain- secondary to the small bowel carcinoma versus pancreatitis  9. Rash-improved;recurrent 07/17/2018. She continues Atarax. 10.  Neutropenia secondary to chemotherapy 07/31/2018   Disposition: Ms. Arseneau appears stable.  She has completed 2 cycles of FOLFOX.  Plan to proceed with cycle 3 today as scheduled.  She will receive Neulasta on the day of pump discontinuation.  Potential toxicities again reviewed.  She agrees to proceed.  We reviewed the CBC from today.  Counts are adequate for treatment.  She will return for lab, follow-up and cycle 4 FOLFOX in 2 weeks.  She will contact the office in the interim with any problems.  Patient seen with Dr. Benay Spice.    Ned Card ANP/GNP-BC   08/06/2018  10:03 AM  This was a shared visit with Ned Card.  Ms. Murchison has completed 2 cycles of FOLFOX.  Her pain has improved.  She will receive G-CSF with this cycle.  We reviewed potential toxicities associated with G-CSF.  The plan is to wean the Duragesic patch at the next office visit if her pain continues to improve.  Julieanne Manson, MD

## 2018-08-07 ENCOUNTER — Telehealth: Payer: Self-pay | Admitting: Oncology

## 2018-08-07 ENCOUNTER — Ambulatory Visit: Payer: 59

## 2018-08-07 NOTE — Telephone Encounter (Signed)
Left message re April appointments. Patient to get updated schedule at tomorrow's visit.

## 2018-08-08 ENCOUNTER — Other Ambulatory Visit: Payer: Self-pay

## 2018-08-08 ENCOUNTER — Inpatient Hospital Stay: Payer: 59

## 2018-08-08 ENCOUNTER — Telehealth: Payer: Self-pay | Admitting: *Deleted

## 2018-08-08 VITALS — BP 132/78 | HR 72 | Temp 98.2°F | Resp 17

## 2018-08-08 DIAGNOSIS — C170 Malignant neoplasm of duodenum: Secondary | ICD-10-CM

## 2018-08-08 MED ORDER — PEGFILGRASTIM INJECTION 6 MG/0.6ML ~~LOC~~
6.0000 mg | PREFILLED_SYRINGE | Freq: Once | SUBCUTANEOUS | Status: AC
  Administered 2018-08-08: 6 mg via SUBCUTANEOUS

## 2018-08-08 MED ORDER — HEPARIN SOD (PORK) LOCK FLUSH 100 UNIT/ML IV SOLN
500.0000 [IU] | Freq: Once | INTRAVENOUS | Status: AC | PRN
  Administered 2018-08-08: 500 [IU]
  Filled 2018-08-08: qty 5

## 2018-08-08 MED ORDER — SODIUM CHLORIDE 0.9% FLUSH
10.0000 mL | INTRAVENOUS | Status: DC | PRN
  Administered 2018-08-08: 10 mL
  Filled 2018-08-08: qty 10

## 2018-08-08 MED ORDER — PEGFILGRASTIM INJECTION 6 MG/0.6ML ~~LOC~~
PREFILLED_SYRINGE | SUBCUTANEOUS | Status: AC
  Filled 2018-08-08: qty 0.6

## 2018-08-08 NOTE — Telephone Encounter (Signed)
Patient left VM asking why Claritin was suggested to take for the Neulasta injection and how can she determine if her symptoms are from the Neulasta vs. Corona virus. Called back and left VM that Claritin is believed by some to diminish the body/joint aches from Neulasta. Neulasta will not cause shortness of breath, coughing or a fever.

## 2018-08-08 NOTE — Patient Instructions (Signed)
Pegfilgrastim injection  What is this medicine?  PEGFILGRASTIM (PEG fil gra stim) is a long-acting granulocyte colony-stimulating factor that stimulates the growth of neutrophils, a type of white blood cell important in the body's fight against infection. It is used to reduce the incidence of fever and infection in patients with certain types of cancer who are receiving chemotherapy that affects the bone marrow, and to increase survival after being exposed to high doses of radiation.  This medicine may be used for other purposes; ask your health care provider or pharmacist if you have questions.  COMMON BRAND NAME(S): Fulphila, Neulasta, UDENYCA  What should I tell my health care provider before I take this medicine?  They need to know if you have any of these conditions:  -kidney disease  -latex allergy  -ongoing radiation therapy  -sickle cell disease  -skin reactions to acrylic adhesives (On-Body Injector only)  -an unusual or allergic reaction to pegfilgrastim, filgrastim, other medicines, foods, dyes, or preservatives  -pregnant or trying to get pregnant  -breast-feeding  How should I use this medicine?  This medicine is for injection under the skin. If you get this medicine at home, you will be taught how to prepare and give the pre-filled syringe or how to use the On-body Injector. Refer to the patient Instructions for Use for detailed instructions. Use exactly as directed. Tell your healthcare provider immediately if you suspect that the On-body Injector may not have performed as intended or if you suspect the use of the On-body Injector resulted in a missed or partial dose.  It is important that you put your used needles and syringes in a special sharps container. Do not put them in a trash can. If you do not have a sharps container, call your pharmacist or healthcare provider to get one.  Talk to your pediatrician regarding the use of this medicine in children. While this drug may be prescribed for  selected conditions, precautions do apply.  Overdosage: If you think you have taken too much of this medicine contact a poison control center or emergency room at once.  NOTE: This medicine is only for you. Do not share this medicine with others.  What if I miss a dose?  It is important not to miss your dose. Call your doctor or health care professional if you miss your dose. If you miss a dose due to an On-body Injector failure or leakage, a new dose should be administered as soon as possible using a single prefilled syringe for manual use.  What may interact with this medicine?  Interactions have not been studied.  Give your health care provider a list of all the medicines, herbs, non-prescription drugs, or dietary supplements you use. Also tell them if you smoke, drink alcohol, or use illegal drugs. Some items may interact with your medicine.  This list may not describe all possible interactions. Give your health care provider a list of all the medicines, herbs, non-prescription drugs, or dietary supplements you use. Also tell them if you smoke, drink alcohol, or use illegal drugs. Some items may interact with your medicine.  What should I watch for while using this medicine?  You may need blood work done while you are taking this medicine.  If you are going to need a MRI, CT scan, or other procedure, tell your doctor that you are using this medicine (On-Body Injector only).  What side effects may I notice from receiving this medicine?  Side effects that you should report to   your doctor or health care professional as soon as possible:  -allergic reactions like skin rash, itching or hives, swelling of the face, lips, or tongue  -back pain  -dizziness  -fever  -pain, redness, or irritation at site where injected  -pinpoint red spots on the skin  -red or dark-brown urine  -shortness of breath or breathing problems  -stomach or side pain, or pain at the shoulder  -swelling  -tiredness  -trouble passing urine or  change in the amount of urine  Side effects that usually do not require medical attention (report to your doctor or health care professional if they continue or are bothersome):  -bone pain  -muscle pain  This list may not describe all possible side effects. Call your doctor for medical advice about side effects. You may report side effects to FDA at 1-800-FDA-1088.  Where should I keep my medicine?  Keep out of the reach of children.  If you are using this medicine at home, you will be instructed on how to store it. Throw away any unused medicine after the expiration date on the label.  NOTE: This sheet is a summary. It may not cover all possible information. If you have questions about this medicine, talk to your doctor, pharmacist, or health care provider.   2019 [employer] Standard (2017-08-07 16:57:08)

## 2018-08-09 ENCOUNTER — Telehealth: Payer: Self-pay | Admitting: *Deleted

## 2018-08-09 NOTE — Telephone Encounter (Addendum)
Called to report she does not feel well since the Neulasta injection yesterday. Feels as if someone would if they had a hangover, feels "green". Constant nausea, but no emesis. She is taking the decadron, ativan, compazine and zofran now as directed. She does not have a fever or diarrhea to suggest GI virus. Able to eat small amounts and trying to push fluids w/average of 32 ounces/day. Asking if there is anything else that can be done for the nausea? Informed her to try to drink more fluids for a goal of 64 ounces/day.  Per Dr. Benay Spice: Call if no better tomorrow and will get her in for IV fluids and IV decadron. Instructed her to call tomorrow morning with an update regardless on how she is doing.

## 2018-08-10 ENCOUNTER — Inpatient Hospital Stay: Payer: 59

## 2018-08-10 ENCOUNTER — Inpatient Hospital Stay (HOSPITAL_BASED_OUTPATIENT_CLINIC_OR_DEPARTMENT_OTHER): Payer: 59 | Admitting: Medical

## 2018-08-10 ENCOUNTER — Other Ambulatory Visit: Payer: Self-pay

## 2018-08-10 ENCOUNTER — Telehealth: Payer: Self-pay | Admitting: *Deleted

## 2018-08-10 VITALS — BP 136/78 | HR 91 | Temp 96.8°F | Resp 16 | Ht 71.0 in | Wt 210.4 lb

## 2018-08-10 DIAGNOSIS — C170 Malignant neoplasm of duodenum: Secondary | ICD-10-CM | POA: Diagnosis not present

## 2018-08-10 DIAGNOSIS — Z79899 Other long term (current) drug therapy: Secondary | ICD-10-CM

## 2018-08-10 DIAGNOSIS — R110 Nausea: Secondary | ICD-10-CM

## 2018-08-10 DIAGNOSIS — E860 Dehydration: Secondary | ICD-10-CM

## 2018-08-10 DIAGNOSIS — Z95828 Presence of other vascular implants and grafts: Secondary | ICD-10-CM

## 2018-08-10 LAB — CMP (CANCER CENTER ONLY)
ALK PHOS: 223 U/L — AB (ref 38–126)
ALT: 34 U/L (ref 0–44)
AST: 42 U/L — ABNORMAL HIGH (ref 15–41)
Albumin: 3.2 g/dL — ABNORMAL LOW (ref 3.5–5.0)
Anion gap: 10 (ref 5–15)
BUN: 13 mg/dL (ref 6–20)
CALCIUM: 9.1 mg/dL (ref 8.9–10.3)
CO2: 29 mmol/L (ref 22–32)
Chloride: 100 mmol/L (ref 98–111)
Creatinine: 0.71 mg/dL (ref 0.44–1.00)
GFR, Est AFR Am: >60 mL/min (ref >60)
GFR, Estimated: >60 mL/min (ref >60)
Glucose, Bld: 91 mg/dL (ref 70–99)
Potassium: 4.1 mmol/L (ref 3.5–5.1)
Sodium: 139 mmol/L (ref 135–145)
Total Bilirubin: 0.5 mg/dL (ref 0.3–1.2)
Total Protein: 7 g/dL (ref 6.5–8.1)

## 2018-08-10 LAB — CBC WITH DIFFERENTIAL (CANCER CENTER ONLY)
Abs Immature Granulocytes: 0 10*3/uL (ref 0.00–0.07)
BASOS PCT: 0 %
Band Neutrophils: 15 %
Basophils Absolute: 0 10*3/uL (ref 0.0–0.1)
Eosinophils Absolute: 0 10*3/uL (ref 0.0–0.5)
Eosinophils Relative: 0 %
HCT: 31.8 % — ABNORMAL LOW (ref 36.0–46.0)
Hemoglobin: 10.2 g/dL — ABNORMAL LOW (ref 12.0–15.0)
LYMPHS ABS: 0.8 10*3/uL (ref 0.7–4.0)
Lymphocytes Relative: 1 %
MCH: 28.3 pg (ref 26.0–34.0)
MCHC: 32.1 g/dL (ref 30.0–36.0)
MCV: 88.3 fL (ref 80.0–100.0)
Monocytes Absolute: 0 10*3/uL — ABNORMAL LOW (ref 0.1–1.0)
Monocytes Relative: 0 %
Neutro Abs: 81.8 10*3/uL — ABNORMAL HIGH (ref 1.7–17.7)
Neutrophils Relative %: 84 %
PLATELETS: 338 10*3/uL (ref 150–400)
RBC: 3.6 MIL/uL — ABNORMAL LOW (ref 3.87–5.11)
RDW: 16.3 % — ABNORMAL HIGH (ref 11.5–15.5)
WBC Count: 82.6 10*3/uL (ref 4.0–10.5)
nRBC: 0 % (ref 0.0–0.2)

## 2018-08-10 MED ORDER — SODIUM CHLORIDE 0.9% FLUSH
10.0000 mL | Freq: Once | INTRAVENOUS | Status: AC
  Administered 2018-08-10: 10 mL via INTRAVENOUS
  Filled 2018-08-10: qty 10

## 2018-08-10 MED ORDER — HEPARIN SOD (PORK) LOCK FLUSH 100 UNIT/ML IV SOLN
500.0000 [IU] | Freq: Once | INTRAVENOUS | Status: AC
  Administered 2018-08-10: 500 [IU] via INTRAVENOUS
  Filled 2018-08-10: qty 5

## 2018-08-10 MED ORDER — SODIUM CHLORIDE 0.9 % IV SOLN
Freq: Once | INTRAVENOUS | Status: AC
  Administered 2018-08-10: 12:00:00 via INTRAVENOUS
  Filled 2018-08-10: qty 250

## 2018-08-10 MED ORDER — SODIUM CHLORIDE 0.9% FLUSH
10.0000 mL | Freq: Once | INTRAVENOUS | Status: AC
  Administered 2018-08-10: 10 mL
  Filled 2018-08-10: qty 10

## 2018-08-10 MED ORDER — SCOPOLAMINE 1 MG/3DAYS TD PT72: 1.0000 | MEDICATED_PATCH | TRANSDERMAL | 12 refills | Status: DC

## 2018-08-10 NOTE — Telephone Encounter (Signed)
VM left reporting she is still very nauseated and not eating or drinking enough. Feels weak and "not well". Feels no better since conversation yesterday. No fever, cough or shortness of breath. Spoke with [facility]: Have her come in to be seen w/labs. High priority scheduling message sent

## 2018-08-10 NOTE — Progress Notes (Signed)
[facility] Progress Note   Hailey Houston [id number] [birth year] 48 y.o.   Hailey Houston is managed by Dr. Dominica Severin B. Sherrill  Actively treated with chemotherapy/immunotherapy/hormonal therapy: yes  Current therapy: FOLFOX with Neulasta support  Last treated: 08/06/2018 (cycle 3)  Next scheduled appointment with provider: 08/21/2018  Assessment: Plan:    Dehydration - Plan: 0.9 %  sodium chloride infusion, sodium chloride flush (NS) 0.9 % injection 10 mL, heparin lock flush 100 unit/mL  Nausea without vomiting - Plan: scopolamine (TRANSDERM-SCOP) 1 MG/3DAYS, sodium chloride flush (NS) 0.9 % injection 10 mL, heparin lock flush 100 unit/mL  Adenocarcinoma of duodenum (HCC)   Dehydration: The patient was given 1 L of normal saline IV today.  Nausea without vomiting: The patient's medications were reviewed with her.  She continues on Compazine as needed, Zofran as needed, and Ativan as needed.  Does state that she continues to have poorly controlled nausea.  She was given a prescription for a scopolamine patch.  Adenocarcinoma of the duodenum: The patient continues to be followed by Dr. Dominica Severin B. Sherrill and is status post cycle 3 of FOLFOX with Neulasta support which was dosed on 08/06/2018.  She will see Dr. Benay Spice in follow-up on 08/21/2018.  Please see After Visit Summary for patient specific instructions.  Future Appointments  Date Time Provider [place]  08/21/2018  8:30 AM [facility] LAB 2 [facility] None  08/21/2018  8:30 AM [facility] [place] FLUSH [facility] None  08/21/2018  8:45 AM Ladell Pier, MD [facility] None  08/21/2018  9:15 AM [facility] INFUSION [facility] None  08/23/2018  1:45 PM [facility] [place] FLUSH [facility] None  09/03/2018 11:00 AM [facility] LAB 3 [facility] None  09/03/2018 11:15 AM [facility] [place] FLUSH [facility] None  09/03/2018 11:45 AM Owens Shark, NP [facility] None  09/03/2018 12:30 PM [facility] INFUSION [facility]  None  09/05/2018  1:30 PM [facility] [place] FLUSH [facility] None  10/04/2018  1:00 PM Cowan, Brianna T [facility] None  10/04/2018  2:00 PM [facility] LAB 1 [facility] None    No orders of the defined types were placed in this encounter.      Subjective:   Patient ID:  Hailey Houston is a 48 y.o. (DOB 11-Oct-1970) female.  Chief Complaint:  Chief Complaint  Patient presents with  . Dehydration    HPI Hailey Houston is a 48 year old female with a history of a metastatic adenocarcinoma of the duodenum who is managed by Dr. Dominica Severin B. Sherrill.  She is status post cycle 3 of FOLFOX given with Neulasta support.  Cycle 3 of chemotherapy was dosed on 08/06/2018.  She presents to the clinic today with ongoing nausea without vomiting despite her use of Compazine, Zofran, and Ativan.  She was given 1 L of normal saline today and reports that she is feeling better after receiving IV fluids.  She denies fevers, chills, sweats, vomiting, constipation, or diarrhea.  Medications: I have reviewed the patient's current medications.  Allergies:  Allergies  Allergen Reactions  . Oxycodone Itching    Past Medical History:  Diagnosis Date  . ADD (attention deficit disorder)   . Adenocarcinoma of duodenum ([place]) 06/16/2018  . Anxiety   . Family history of adverse reaction to anesthesia    mom goes into afib  . Wears glasses    driving    Past Surgical History:  Procedure Laterality Date  . BILIARY STENT PLACEMENT N/A 06/14/2018   Procedure: BILIARY STENT PLACEMENT;  Surgeon: Milus Banister, MD;  Location: [facility];  Service: Endoscopy;  Laterality: N/A;  . BIOPSY  06/14/2018   Procedure: BIOPSY;  Surgeon: Milus Banister, MD;  Location: [facility];  Service: Endoscopy;;  . ENDOSCOPIC RETROGRADE CHOLANGIOPANCREATOGRAPHY (ERCP) WITH PROPOFOL N/A 06/14/2018   Procedure: ENDOSCOPIC RETROGRADE CHOLANGIOPANCREATOGRAPHY (ERCP) WITH PROPOFOL;  Surgeon: Milus Banister, MD;  Location: [facility];  Service: Endoscopy;  Laterality: N/A;  . ESOPHAGOGASTRODUODENOSCOPY N/A 06/14/2018   Procedure: ESOPHAGOGASTRODUODENOSCOPY (EGD);  Surgeon: Milus Banister, MD;  Location: [facility];  Service: Endoscopy;  Laterality: N/A;  . EUS N/A 06/14/2018   Procedure: UPPER ENDOSCOPIC ULTRASOUND (EUS) RADIAL;  Surgeon: Milus Banister, MD;  Location: [facility];  Service: Endoscopy;  Laterality: N/A;  . IR IMAGING GUIDED PORT INSERTION  07/06/2018  . ORIF ANKLE FRACTURE Left 08/29/2013   Procedure: LEFT FIBULA -FRACTURE OPEN TREATMENT DISTAL FIBULAR (LATERALERAL MALLEOLUS) INCLUDES INTERNAL FIXATION, ;  Surgeon: [name], MD;  Location: [place];  Service: Orthopedics;  Laterality: Left;  . PERINEAL LACERATION REPAIR     post childbirth  . SPHINCTEROTOMY  06/14/2018   Procedure: SPHINCTEROTOMY;  Surgeon: Milus Banister, MD;  Location: [facility];  Service: Endoscopy;;  . TIBIA IM NAIL INSERTION Left 08/29/2013   Procedure: FRACTURE TREATMENT TIBIAL SHAFT BY INTRAMEDULLARY IMPLANT WITH SCREWS ;  Surgeon: [name], MD;  Location: [place];  Service: Orthopedics;  Laterality: Left;    Family History  Problem Relation Age of Onset  . Heart disease Mother   . Heart disease Father     Social History   Socioeconomic History  . Marital status: Divorced    Spouse name: Not on file  . Number of children: Not on file  . Years of education: Not on file  . Highest education level: Not on file  Occupational History  . Not on file  Social Needs  . Financial resource strain: Not on file  . Food insecurity:    Worry: Not on file    Inability: Not on file  . Transportation needs:    Medical: Not on file    Non-medical: Not on file  Tobacco Use  . Smoking status: Current Some Day Smoker  . Smokeless tobacco: Never Used  Substance and Sexual Activity  . Alcohol use: Yes    Comment: occ  . Drug use: No  . Sexual activity: Not Currently     Comment: a pk/week  Lifestyle  . Physical activity:    Days per week: Not on file    Minutes per session: Not on file  . Stress: Not on file  Relationships  . Social connections:    Talks on phone: Not on file    Gets together: Not on file    Attends religious service: Not on file    Active member of club or organization: Not on file    Attends meetings of clubs or organizations: Not on file    Relationship status: Not on file  . Intimate partner violence:    Fear of current or ex partner: Not on file    Emotionally abused: Not on file    Physically abused: Not on file    Forced sexual activity: Not on file  Other Topics Concern  . Not on file  Social History Narrative  . Not on file    Past Medical History, Surgical history, Social history, and Family history were reviewed and updated as appropriate.   Please see review of systems for further  details on the patient's review from today.   Review of Systems:  Review of Systems  Constitutional: Negative for appetite change, chills, diaphoresis and fever.  HENT: Negative for trouble swallowing.   Respiratory: Negative for cough, choking, shortness of breath and wheezing.   Cardiovascular: Negative for chest pain and palpitations.  Gastrointestinal: Positive for nausea. Negative for constipation, diarrhea and vomiting.  Genitourinary: Negative for decreased urine volume.  Neurological: Negative for headaches.    Objective:   Physical Exam:  BP 136/78 (BP Location: Left Arm, Patient Position: Sitting)   Pulse 91   Temp (!) 96.8 F (36 C) (Oral)   Resp 16   Ht 5\' 11"  (1.803 m)   Wt 210 lb 6.4 oz (95.4 kg)   SpO2 100%   BMI 29.34 kg/m  ECOG: 1  Physical Exam Constitutional:      General: She is not in acute distress.    Appearance: Normal appearance. She is not ill-appearing.  HENT:     Head: Normocephalic and atraumatic.  Eyes:     General: No scleral icterus.       Right eye: No discharge.        Left  eye: No discharge.     Conjunctiva/sclera: Conjunctivae normal.  Neurological:     Mental Status: She is alert.     Coordination: Coordination normal.     Gait: Gait normal.  Psychiatric:        Mood and Affect: Mood normal.        Behavior: Behavior normal.        Thought Content: Thought content normal.        Judgment: Judgment normal.     Lab Review:     Component Value Date/Time   NA 139 08/10/2018 1110   K 4.1 08/10/2018 1110   CL 100 08/10/2018 1110   CO2 29 08/10/2018 1110   GLUCOSE 91 08/10/2018 1110   BUN 13 08/10/2018 1110   CREATININE 0.71 08/10/2018 1110   CALCIUM 9.1 08/10/2018 1110   PROT 7.0 08/10/2018 1110   ALBUMIN 3.2 (L) 08/10/2018 1110   AST 42 (H) 08/10/2018 1110   ALT 34 08/10/2018 1110   ALKPHOS 223 (H) 08/10/2018 1110   BILITOT 0.5 08/10/2018 1110   GFRNONAA >60 08/10/2018 1110   GFRAA >60 08/10/2018 1110       Component Value Date/Time   WBC 82.6 (HH) 08/10/2018 1110   WBC 6.3 07/06/2018 1050   RBC 3.60 (L) 08/10/2018 1110   HGB 10.2 (L) 08/10/2018 1110   HCT 31.8 (L) 08/10/2018 1110   PLT 338 08/10/2018 1110   MCV 88.3 08/10/2018 1110   MCH 28.3 08/10/2018 1110   MCHC 32.1 08/10/2018 1110   RDW 16.3 (H) 08/10/2018 1110   LYMPHSABS 0.8 08/10/2018 1110   MONOABS 0.0 (L) 08/10/2018 1110   EOSABS 0.0 08/10/2018 1110   BASOSABS 0.0 08/10/2018 1110   -------------------------------  Imaging from last 24 hours (if applicable):  Radiology interpretation: No results found.      This case was discussed with Dr. Benay Spice. He expressed agreement with my management of this patient.

## 2018-08-10 NOTE — Progress Notes (Signed)
Pt to be given IVF per PA Lucianne Lei today.  Pt will receive infusion with RN Dixie who was given report on pt.  PA Lucianne Lei aware of no nursing assessment by [employer].

## 2018-08-10 NOTE — Patient Instructions (Signed)
Dehydration, Adult  Dehydration is a condition in which there is not enough fluid or water in the body. This happens when you lose more fluids than you take in. Important organs, such as the kidneys, brain, and heart, cannot function without a proper amount of fluids. Any loss of fluids from the body can lead to dehydration. Dehydration can range from mild to severe. This condition should be treated right away to prevent it from becoming severe. What are the causes? This condition may be caused by:  Vomiting.  Diarrhea.  Excessive sweating, such as from heat exposure or exercise.  Not drinking enough fluid, especially: ? When ill. ? While doing activity that requires a lot of energy.  Excessive urination.  Fever.  Infection.  Certain medicines, such as medicines that cause the body to lose excess fluid (diuretics).  Inability to access safe drinking water.  Reduced physical ability to get adequate water and food. What increases the risk? This condition is more likely to develop in people:  Who have a poorly controlled long-term (chronic) illness, such as diabetes, heart disease, or kidney disease.  Who are age 65 or older.  Who are disabled.  Who live in a place with high altitude.  Who play endurance sports. What are the signs or symptoms? Symptoms of mild dehydration may include:  Thirst.  Dry lips.  Slightly dry mouth.  Dry, warm skin.  Dizziness. Symptoms of moderate dehydration may include:  Very dry mouth.  Muscle cramps.  Dark urine. Urine may be the color of tea.  Decreased urine production.  Decreased tear production.  Heartbeat that is irregular or faster than normal (palpitations).  Headache.  Light-headedness, especially when you stand up from a sitting position.  Fainting (syncope). Symptoms of severe dehydration may include:  Changes in skin, such as: ? Cold and clammy skin. ? Blotchy (mottled) or pale skin. ? Skin that does  not quickly return to normal after being lightly pinched and released (poor skin turgor).  Changes in body fluids, such as: ? Extreme thirst. ? No tear production. ? Inability to sweat when body temperature is high, such as in hot weather. ? Very little urine production.  Changes in vital signs, such as: ? Weak pulse. ? Pulse that is more than 100 beats a minute when sitting still. ? Rapid breathing. ? Low blood pressure.  Other changes, such as: ? Sunken eyes. ? Cold hands and feet. ? Confusion. ? Lack of energy (lethargy). ? Difficulty waking up from sleep. ? Short-term weight loss. ? Unconsciousness. How is this diagnosed? This condition is diagnosed based on your symptoms and a physical exam. Blood and urine tests may be done to help confirm the diagnosis. How is this treated? Treatment for this condition depends on the severity. Mild or moderate dehydration can often be treated at home. Treatment should be started right away. Do not wait until dehydration becomes severe. Severe dehydration is an emergency and it needs to be treated in a hospital. Treatment for mild dehydration may include:  Drinking more fluids.  Replacing salts and minerals in your blood (electrolytes) that you may have lost. Treatment for moderate dehydration may include:  Drinking an oral rehydration solution (ORS). This is a drink that helps you replace fluids and electrolytes (rehydrate). It can be found at pharmacies and retail stores. Treatment for severe dehydration may include:  Receiving fluids through an IV tube.  Receiving an electrolyte solution through a feeding tube that is passed through your nose and   into your stomach (nasogastric tube, or NG tube).  Correcting any abnormalities in electrolytes.  Treating the underlying cause of dehydration. Follow these instructions at home:  If directed by your health care provider, drink an ORS: ? Make an ORS by following instructions on the  package. ? Start by drinking small amounts, about  cup (120 mL) every 5-10 minutes. ? Slowly increase how much you drink until you have taken the amount recommended by your health care provider.  Drink enough clear fluid to keep your urine clear or pale yellow. If you were told to drink an ORS, finish the ORS first, then start slowly drinking other clear fluids. Drink fluids such as: ? Water. Do not drink only water. Doing that can lead to having too little salt (sodium) in the body (hyponatremia). ? Ice chips. ? Fruit juice that you have added water to (diluted fruit juice). ? Low-calorie sports drinks.  Avoid: ? Alcohol. ? Drinks that contain a lot of sugar. These include high-calorie sports drinks, fruit juice that is not diluted, and soda. ? Caffeine. ? Foods that are greasy or contain a lot of fat or sugar.  Take over-the-counter and prescription medicines only as told by your health care provider.  Do not take sodium tablets. This can lead to having too much sodium in the body (hypernatremia).  Eat foods that contain a healthy balance of electrolytes, such as bananas, oranges, potatoes, tomatoes, and spinach.  Keep all follow-up visits as told by your health care provider. This is important. Contact a health care provider if:  You have abdominal pain that: ? Gets worse. ? Stays in one area (localizes).  You have a rash.  You have a stiff neck.  You are more irritable than usual.  You are sleepier or more difficult to wake up than usual.  You feel weak or dizzy.  You feel very thirsty.  You have urinated only a small amount of very dark urine over 6-8 hours. Get help right away if:  You have symptoms of severe dehydration.  You cannot drink fluids without vomiting.  Your symptoms get worse with treatment.  You have a fever.  You have a severe headache.  You have vomiting or diarrhea that: ? Gets worse. ? Does not go away.  You have blood or green matter  (bile) in your vomit.  You have blood in your stool. This may cause stool to look black and tarry.  You have not urinated in 6-8 hours.  You faint.  Your heart rate while sitting still is over 100 beats a minute.  You have trouble breathing. This information is not intended to replace advice given to you by your health care provider. Make sure you discuss any questions you have with your health care provider. Document Released: 05/02/2005 Document Revised: 11/27/2015 Document Reviewed: 06/26/2015 Elsevier Interactive Patient Education  2019 [employer]  

## 2018-08-14 ENCOUNTER — Ambulatory Visit: Payer: 59

## 2018-08-14 ENCOUNTER — Other Ambulatory Visit: Payer: 59

## 2018-08-14 ENCOUNTER — Other Ambulatory Visit: Payer: Self-pay | Admitting: Oncology

## 2018-08-14 ENCOUNTER — Ambulatory Visit: Payer: 59 | Admitting: Oncology

## 2018-08-19 ENCOUNTER — Other Ambulatory Visit: Payer: Self-pay | Admitting: Oncology

## 2018-08-20 ENCOUNTER — Other Ambulatory Visit: Payer: Self-pay | Admitting: Nurse Practitioner

## 2018-08-20 ENCOUNTER — Telehealth: Payer: Self-pay | Admitting: *Deleted

## 2018-08-20 DIAGNOSIS — C170 Malignant neoplasm of duodenum: Secondary | ICD-10-CM

## 2018-08-20 MED ORDER — FENTANYL 75 MCG/HR TD PT72: 1.0000 | MEDICATED_PATCH | TRANSDERMAL | 0 refills | Status: DC

## 2018-08-20 NOTE — Telephone Encounter (Signed)
Called to request refill on Duragesic patch. Last one applied on Saturday. Ned Card, NP notified.

## 2018-08-21 ENCOUNTER — Inpatient Hospital Stay: Payer: 59

## 2018-08-21 ENCOUNTER — Other Ambulatory Visit: Payer: Self-pay

## 2018-08-21 ENCOUNTER — Inpatient Hospital Stay: Payer: 59 | Attending: Oncology | Admitting: Oncology

## 2018-08-21 VITALS — BP 111/66 | HR 72 | Temp 97.9°F | Resp 18 | Ht 71.0 in | Wt 209.5 lb

## 2018-08-21 DIAGNOSIS — C170 Malignant neoplasm of duodenum: Secondary | ICD-10-CM

## 2018-08-21 DIAGNOSIS — Z95828 Presence of other vascular implants and grafts: Secondary | ICD-10-CM

## 2018-08-21 DIAGNOSIS — D649 Anemia, unspecified: Secondary | ICD-10-CM

## 2018-08-21 DIAGNOSIS — Z5111 Encounter for antineoplastic chemotherapy: Secondary | ICD-10-CM | POA: Diagnosis not present

## 2018-08-21 DIAGNOSIS — K831 Obstruction of bile duct: Secondary | ICD-10-CM | POA: Diagnosis not present

## 2018-08-21 DIAGNOSIS — Z79899 Other long term (current) drug therapy: Secondary | ICD-10-CM

## 2018-08-21 LAB — CMP (CANCER CENTER ONLY)
ALT: 69 U/L — ABNORMAL HIGH (ref 0–44)
AST: 92 U/L — ABNORMAL HIGH (ref 15–41)
Albumin: 3.5 g/dL (ref 3.5–5.0)
Alkaline Phosphatase: 159 U/L — ABNORMAL HIGH (ref 38–126)
Anion gap: 10 (ref 5–15)
BUN: 11 mg/dL (ref 6–20)
CO2: 28 mmol/L (ref 22–32)
Calcium: 9.2 mg/dL (ref 8.9–10.3)
Chloride: 102 mmol/L (ref 98–111)
Creatinine: 0.78 mg/dL (ref 0.44–1.00)
GFR, Est AFR Am: >60 mL/min (ref >60)
GFR, Estimated: >60 mL/min (ref >60)
Glucose, Bld: 102 mg/dL — ABNORMAL HIGH (ref 70–99)
Potassium: 4.1 mmol/L (ref 3.5–5.1)
Sodium: 140 mmol/L (ref 135–145)
Total Bilirubin: 0.4 mg/dL (ref 0.3–1.2)
Total Protein: 6.9 g/dL (ref 6.5–8.1)

## 2018-08-21 LAB — CBC WITH DIFFERENTIAL (CANCER CENTER ONLY)
Abs Immature Granulocytes: 0.57 10*3/uL — ABNORMAL HIGH (ref 0.00–0.07)
Basophils Absolute: 0.1 10*3/uL (ref 0.0–0.1)
Basophils Relative: 1 %
Eosinophils Absolute: 0.1 10*3/uL (ref 0.0–0.5)
Eosinophils Relative: 1 %
HCT: 34.2 % — ABNORMAL LOW (ref 36.0–46.0)
Hemoglobin: 10.5 g/dL — ABNORMAL LOW (ref 12.0–15.0)
Immature Granulocytes: 7 %
Lymphocytes Relative: 22 %
Lymphs Abs: 1.9 10*3/uL (ref 0.7–4.0)
MCH: 27.6 pg (ref 26.0–34.0)
MCHC: 30.7 g/dL (ref 30.0–36.0)
MCV: 90 fL (ref 80.0–100.0)
Monocytes Absolute: 1.2 10*3/uL — ABNORMAL HIGH (ref 0.1–1.0)
Monocytes Relative: 13 %
Neutro Abs: 4.9 10*3/uL (ref 1.7–7.7)
Neutrophils Relative %: 56 %
Platelet Count: 224 10*3/uL (ref 150–400)
RBC: 3.8 MIL/uL — ABNORMAL LOW (ref 3.87–5.11)
RDW: 18.1 % — ABNORMAL HIGH (ref 11.5–15.5)
WBC Count: 8.7 10*3/uL (ref 4.0–10.5)
nRBC: 0.2 % (ref 0.0–0.2)

## 2018-08-21 LAB — CEA (IN HOUSE-CHCC): CEA (CHCC-In House): 4.69 ng/mL (ref 0.00–5.00)

## 2018-08-21 MED ORDER — PROMETHAZINE HCL 12.5 MG PO TABS: 12.5000 mg | ORAL_TABLET | Freq: Four times a day (QID) | ORAL | 1 refills | Status: DC | PRN

## 2018-08-21 MED ORDER — PALONOSETRON HCL INJECTION 0.25 MG/5ML
0.2500 mg | Freq: Once | INTRAVENOUS | Status: AC
  Administered 2018-08-21: 11:00:00 0.25 mg via INTRAVENOUS

## 2018-08-21 MED ORDER — OXALIPLATIN CHEMO INJECTION 100 MG/20ML
85.0000 mg/m2 | Freq: Once | INTRAVENOUS | Status: AC
  Administered 2018-08-21: 13:00:00 185 mg via INTRAVENOUS
  Filled 2018-08-21: qty 37

## 2018-08-21 MED ORDER — SODIUM CHLORIDE 0.9% FLUSH
10.0000 mL | Freq: Once | INTRAVENOUS | Status: AC
  Administered 2018-08-21: 10 mL
  Filled 2018-08-21: qty 10

## 2018-08-21 MED ORDER — FLUOROURACIL CHEMO INJECTION 2.5 GM/50ML
400.0000 mg/m2 | Freq: Once | INTRAVENOUS | Status: AC
  Administered 2018-08-21: 15:00:00 850 mg via INTRAVENOUS
  Filled 2018-08-21: qty 17

## 2018-08-21 MED ORDER — PALONOSETRON HCL INJECTION 0.25 MG/5ML
INTRAVENOUS | Status: AC
  Filled 2018-08-21: qty 5

## 2018-08-21 MED ORDER — DEXTROSE 5 % IV SOLN
Freq: Once | INTRAVENOUS | Status: AC
  Administered 2018-08-21: 11:00:00 via INTRAVENOUS
  Filled 2018-08-21: qty 250

## 2018-08-21 MED ORDER — DEXTROSE 5 % IV SOLN
Freq: Once | INTRAVENOUS | Status: AC
  Administered 2018-08-21: 10:00:00 via INTRAVENOUS
  Filled 2018-08-21: qty 250

## 2018-08-21 MED ORDER — LEUCOVORIN CALCIUM INJECTION 350 MG
400.0000 mg/m2 | Freq: Once | INTRAVENOUS | Status: AC
  Administered 2018-08-21: 872 mg via INTRAVENOUS
  Filled 2018-08-21: qty 43.6

## 2018-08-21 MED ORDER — SODIUM CHLORIDE 0.9 % IV SOLN
Freq: Once | INTRAVENOUS | Status: AC
  Administered 2018-08-21: 11:00:00 via INTRAVENOUS
  Filled 2018-08-21: qty 5

## 2018-08-21 MED ORDER — SODIUM CHLORIDE 0.9% FLUSH
10.0000 mL | INTRAVENOUS | Status: DC | PRN
  Administered 2018-08-21: 15:00:00 10 mL
  Filled 2018-08-21: qty 10

## 2018-08-21 MED ORDER — SODIUM CHLORIDE 0.9 % IV SOLN
2400.0000 mg/m2 | INTRAVENOUS | Status: DC
  Administered 2018-08-21: 5250 mg via INTRAVENOUS
  Filled 2018-08-21: qty 105

## 2018-08-21 MED ORDER — DEXAMETHASONE 4 MG PO TABS: 8.0000 mg | ORAL_TABLET | Freq: Two times a day (BID) | ORAL | 2 refills | Status: DC

## 2018-08-21 NOTE — Progress Notes (Signed)
[place] OFFICE PROGRESS NOTE   Diagnosis: Small bowel carcinoma  INTERVAL HISTORY:   Hailey Houston completed another cycle of FOLFOX beginning 08/06/2018.  She received Neulasta following chemotherapy.  She developed nausea beginning on the day following chemotherapy.  She had an episode of emesis on day 3. No mouth sores or diarrhea.  She has persistent cold sensitivity.  No peripheral numbness.  Nausea is partially relieved with Compazine.  The abdominal pain has improved.  She takes approximately 1 Dilaudid tablet per day for breakthrough pain.  She received intravenous fluids on 08/10/2018.  Objective:  Vital signs in last 24 hours:  Blood pressure 111/66, pulse 72, temperature 97.9 F (36.6 C), temperature source Oral, resp. rate 18, height '5\' 11"'$  (1.803 m), weight 209 lb 8 oz (95 kg), SpO2 99 %.    HEENT: No thrush or ulcers Resp: Lungs clear bilaterally Cardio: Regular rate and rhythm GI: No mass, no hepatomegaly, tender in the mid upper abdomen Vascular: No leg edema  Skin: Palms without erythema  Portacath/PICC-without erythema  Lab Results:  Lab Results  Component Value Date   WBC 82.6 (HH) 08/10/2018   HGB 10.2 (L) 08/10/2018   HCT 31.8 (L) 08/10/2018   MCV 88.3 08/10/2018   PLT 338 08/10/2018   NEUTROABS 81.8 (H) 08/10/2018    CMP  Lab Results  Component Value Date   NA 139 08/10/2018   K 4.1 08/10/2018   CL 100 08/10/2018   CO2 29 08/10/2018   GLUCOSE 91 08/10/2018   BUN 13 08/10/2018   CREATININE 0.71 08/10/2018   CALCIUM 9.1 08/10/2018   PROT 7.0 08/10/2018   ALBUMIN 3.2 (L) 08/10/2018   AST 42 (H) 08/10/2018   ALT 34 08/10/2018   ALKPHOS 223 (H) 08/10/2018   BILITOT 0.5 08/10/2018   GFRNONAA >60 08/10/2018   GFRAA >60 08/10/2018    Lab Results  Component Value Date   CEA1 10.9 (H) 06/26/2018     Medications: I have reviewed the patient's current medications.   Assessment/Plan: 1. Adenocarcinoma of the duodenum   CT abdomen/pelvis 06/16/2018-changes of pancreatitis, fullness at the head of the pancreas without a defined mass, multiple subcentimeter peripancreatic lymph nodes, distal common bile duct stent, multiple nodules at the lung bases  EUS 06/14/2018-ulcerated mucosa/mass at the distal duodenal bulb with extension to the pancreas head, portal adenopathy, biopsy of duodenal mass-adenocarcinoma;foundation 1-microsatellite stable, tumor mutational burden 1, [id number] mutation  ERCP 06/14/2018-distal duodenal bulb mass, 1 cm proximal to the ampulla causing biliary obstruction, status post placement of a metal common bile duct stent  CT chest 06/20/2018-bilateral pulmonary nodules consistent with metastatic disease, supraclavicular, mediastinal, and hilar lymphadenopathy, changes of pancreatitis  Ultrasound-guided biopsy of a left supraclavicular lymph node 06/22/2018-metastatic adenocarcinoma  Cycle 1 FOLFOX 07/03/2018  Cycle 2 FOLFOX 07/17/2018  Cycle 3 FOLFOX 08/06/2018, Neulasta added  Cycle 4 FOLFOX 08/21/2018  2.Biliary obstruction secondary to the duodenal mass, status post placement of a bile duct stent 06/14/2018 3.Post ERCP pancreatitis 06/16/2018 4.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 Abdominal pain- secondary to the small bowel carcinoma versus pancreatitis  9. Rash-improved 10.  Neutropenia secondary to chemotherapy 07/31/2018     Disposition: Ms. Schwanke appears unchanged.  She has completed 3 cycles of FOLFOX.  She continues to have delayed nausea following chemotherapy.  We increase the prophylactic Decadron dose and added Phenergan  today.  She will not take Compazine while using Phenergan.  She will contact us for persistent nausea following this cycle.  She will return for an office visit  and chemotherapy in 2 weeks.  We will follow-up on the CEA today.  The plan is to schedule restaging CTs after cycle 5.  Betsy Coder, MD  08/21/2018  9:25 AM

## 2018-08-21 NOTE — Patient Instructions (Signed)
Coronavirus (COVID-19) Are you at risk?  Are you at risk for the Coronavirus (COVID-19)?  To be considered HIGH RISK for Coronavirus (COVID-19), you have to meet the following criteria:  . Traveled to [place], [place], [place], [place] or [place]; or in the [place] to [place], [place], [place], or [place]; and have fever, cough, and shortness of breath within the last 2 weeks of travel OR . Been in close contact with a person diagnosed with COVID-19 within the last 2 weeks and have fever, cough, and shortness of breath . IF YOU DO NOT MEET THESE CRITERIA, YOU ARE CONSIDERED LOW RISK FOR COVID-19.  What to do if you are HIGH RISK for COVID-19?  . If you are having a medical emergency, call 911. . Seek medical care right away. Before you go to a doctor's office, urgent care or emergency department, call ahead and tell them about your recent travel, contact with someone diagnosed with COVID-19, and your symptoms. You should receive instructions from your physician's office regarding next steps of care.  . When you arrive at healthcare provider, tell the healthcare staff immediately you have returned from visiting [place], [place], [place], [place] or [place]; or traveled in the [place] to [place], [place], [place], or [place]; in the last two weeks or you have been in close contact with a person diagnosed with COVID-19 in the last 2 weeks.   . Tell the health care staff about your symptoms: fever, cough and shortness of breath. . After you have been seen by a medical provider, you will be either: o Tested for (COVID-19) and discharged home on quarantine except to seek medical care if symptoms worsen, and asked to  - Stay home and avoid contact with others until you get your results (4-5 days)  - Avoid travel on public transportation if possible (such as bus, train, or airplane) or o Sent to the Emergency Department by EMS for evaluation, COVID-19 testing, and possible  admission depending on your condition and test results.  What to do if you are LOW RISK for COVID-19?  Reduce your risk of any infection by using the same precautions used for avoiding the common cold or flu:  . Wash your hands often with soap and warm water for at least 20 seconds.  If soap and water are not readily available, use an alcohol-based hand sanitizer with at least 60% alcohol.  . If coughing or sneezing, cover your mouth and nose by coughing or sneezing into the elbow areas of your shirt or coat, into a tissue or into your sleeve (not your hands). . Avoid shaking hands with others and consider head nods or verbal greetings only. . Avoid touching your eyes, nose, or mouth with unwashed hands.  . Avoid close contact with people who are sick. . Avoid places or events with large numbers of people in one location, like concerts or sporting events. . Carefully consider travel plans you have or are making. . If you are planning any travel outside or inside the [place], visit the CDC's Travelers' Health webpage for the latest health notices. . If you have some symptoms but not all symptoms, continue to monitor at home and seek medical attention if your symptoms worsen. . If you are having a medical emergency, call 911.   ADDITIONAL HEALTHCARE OPTIONS FOR PATIENTS  [facility] / e-Visit: [email or web address]         [facility]: [phone]  [place]   Urgent Care: [facility]: [facility] Discharge Instructions for Patients Receiving Chemotherapy  Today you received the following chemotherapy agents: Oxaliplatin (Eloxatin), Leucovorin, Fluorouracil (Adrucil, 5-FU)  To help prevent nausea and vomiting after your treatment, we encourage you to take your nausea medication as directed.    If you develop nausea and vomiting that is not controlled  by your nausea medication, call the clinic.   BELOW ARE SYMPTOMS THAT SHOULD BE REPORTED IMMEDIATELY:  *FEVER GREATER THAN 100.5 F  *CHILLS WITH OR WITHOUT FEVER  NAUSEA AND VOMITING THAT IS NOT CONTROLLED WITH YOUR NAUSEA MEDICATION  *UNUSUAL SHORTNESS OF BREATH  *UNUSUAL BRUISING OR BLEEDING  TENDERNESS IN MOUTH AND THROAT WITH OR WITHOUT PRESENCE OF ULCERS  *URINARY PROBLEMS  *BOWEL PROBLEMS  UNUSUAL RASH Items with * indicate a potential emergency and should be followed up as soon as possible.  Feel free to call the clinic should you have any questions or concerns. The clinic phone number is [phone].  Please show the [facility] at check-in to the Emergency Department and triage nurse.  [facility] Discharge Instructions for Patients receiving Home Portable Chemo Pump   **The bag should finish at 46 hours, 96 hours or 7 days. For example, if your pump is scheduled for 46 hours and it was put on at 4pm, it should finish at 2 pm the day it is scheduled to come off regardless of your appointment time.    Estimated time to finish   _________________________ (Have your nurse fill in)     ** if the display on your pump reads "Low Volume" and it is beeping, take the batteries out of the pump and come to the cancer center for it to be taken off.   **If the pump alarms go off prior to the pump reading "Low Volume" then call the [phone] and someone can assist you.  **If the plunger comes out and the bag fluid is running out, please use your chemo spill kit to clean up the spill. Do not use paper towels or other house hold products.  ** If you have problems or questions regarding your pump, please call either the [phone] or the cancer center Monday-Friday 8:00am-4:30pm at [phone] and we will assist you.  If you are unable to get assistance then go to [facility] Emergency Room, ask the staff to contact the IV team for assistance.

## 2018-08-21 NOTE — Progress Notes (Signed)
Per Dr. Benay Spice, ok to treat with AST 92

## 2018-08-22 ENCOUNTER — Telehealth: Payer: Self-pay | Admitting: *Deleted

## 2018-08-22 ENCOUNTER — Other Ambulatory Visit: Payer: Self-pay | Admitting: Nurse Practitioner

## 2018-08-22 DIAGNOSIS — C170 Malignant neoplasm of duodenum: Secondary | ICD-10-CM

## 2018-08-22 NOTE — Telephone Encounter (Addendum)
Called to ask if she starts her dexamethasone today or day after pump? Also reports her port site is painful and feels more mobile than usual. Uncomfortable to sleep and this is all new. Instructed her to begin the dexamethasone today and come in at 2pm and I will look at it. @ 1415: Inspected port and there was no swelling or pain with palpation. Good blood return. Previous dressing appears to have pulled needle/port to the side, which could explain her discomfort. Removed current dressing and cleaned site and applied new dressing to port. She will return tomorrow to be deaccesed from infusion pump.

## 2018-08-23 ENCOUNTER — Inpatient Hospital Stay: Payer: 59

## 2018-08-23 ENCOUNTER — Other Ambulatory Visit: Payer: Self-pay

## 2018-08-23 VITALS — BP 128/68 | HR 72 | Temp 98.2°F | Resp 17

## 2018-08-23 DIAGNOSIS — C170 Malignant neoplasm of duodenum: Secondary | ICD-10-CM

## 2018-08-23 MED ORDER — SODIUM CHLORIDE 0.9% FLUSH
10.0000 mL | INTRAVENOUS | Status: DC | PRN
  Administered 2018-08-23: 10 mL
  Filled 2018-08-23: qty 10

## 2018-08-23 MED ORDER — HEPARIN SOD (PORK) LOCK FLUSH 100 UNIT/ML IV SOLN
500.0000 [IU] | Freq: Once | INTRAVENOUS | Status: AC | PRN
  Administered 2018-08-23: 500 [IU]
  Filled 2018-08-23: qty 5

## 2018-08-23 MED ORDER — PEGFILGRASTIM INJECTION 6 MG/0.6ML ~~LOC~~
6.0000 mg | PREFILLED_SYRINGE | Freq: Once | SUBCUTANEOUS | Status: AC
  Administered 2018-08-23: 6 mg via SUBCUTANEOUS

## 2018-08-23 MED ORDER — PEGFILGRASTIM INJECTION 6 MG/0.6ML ~~LOC~~
PREFILLED_SYRINGE | SUBCUTANEOUS | Status: AC
  Filled 2018-08-23: qty 0.6

## 2018-08-23 NOTE — Patient Instructions (Signed)
Pegfilgrastim injection  What is this medicine?  PEGFILGRASTIM (PEG fil gra stim) is a long-acting granulocyte colony-stimulating factor that stimulates the growth of neutrophils, a type of white blood cell important in the body's fight against infection. It is used to reduce the incidence of fever and infection in patients with certain types of cancer who are receiving chemotherapy that affects the bone marrow, and to increase survival after being exposed to high doses of radiation.  This medicine may be used for other purposes; ask your health care provider or pharmacist if you have questions.  COMMON BRAND NAME(S): Fulphila, Neulasta, UDENYCA  What should I tell my health care provider before I take this medicine?  They need to know if you have any of these conditions:  -kidney disease  -latex allergy  -ongoing radiation therapy  -sickle cell disease  -skin reactions to acrylic adhesives (On-Body Injector only)  -an unusual or allergic reaction to pegfilgrastim, filgrastim, other medicines, foods, dyes, or preservatives  -pregnant or trying to get pregnant  -breast-feeding  How should I use this medicine?  This medicine is for injection under the skin. If you get this medicine at home, you will be taught how to prepare and give the pre-filled syringe or how to use the On-body Injector. Refer to the patient Instructions for Use for detailed instructions. Use exactly as directed. Tell your healthcare provider immediately if you suspect that the On-body Injector may not have performed as intended or if you suspect the use of the On-body Injector resulted in a missed or partial dose.  It is important that you put your used needles and syringes in a special sharps container. Do not put them in a trash can. If you do not have a sharps container, call your pharmacist or healthcare provider to get one.  Talk to your pediatrician regarding the use of this medicine in children. While this drug may be prescribed for  selected conditions, precautions do apply.  Overdosage: If you think you have taken too much of this medicine contact a poison control center or emergency room at once.  NOTE: This medicine is only for you. Do not share this medicine with others.  What if I miss a dose?  It is important not to miss your dose. Call your doctor or health care professional if you miss your dose. If you miss a dose due to an On-body Injector failure or leakage, a new dose should be administered as soon as possible using a single prefilled syringe for manual use.  What may interact with this medicine?  Interactions have not been studied.  Give your health care provider a list of all the medicines, herbs, non-prescription drugs, or dietary supplements you use. Also tell them if you smoke, drink alcohol, or use illegal drugs. Some items may interact with your medicine.  This list may not describe all possible interactions. Give your health care provider a list of all the medicines, herbs, non-prescription drugs, or dietary supplements you use. Also tell them if you smoke, drink alcohol, or use illegal drugs. Some items may interact with your medicine.  What should I watch for while using this medicine?  You may need blood work done while you are taking this medicine.  If you are going to need a MRI, CT scan, or other procedure, tell your doctor that you are using this medicine (On-Body Injector only).  What side effects may I notice from receiving this medicine?  Side effects that you should report to   your doctor or health care professional as soon as possible:  -allergic reactions like skin rash, itching or hives, swelling of the face, lips, or tongue  -back pain  -dizziness  -fever  -pain, redness, or irritation at site where injected  -pinpoint red spots on the skin  -red or dark-brown urine  -shortness of breath or breathing problems  -stomach or side pain, or pain at the shoulder  -swelling  -tiredness  -trouble passing urine or  change in the amount of urine  Side effects that usually do not require medical attention (report to your doctor or health care professional if they continue or are bothersome):  -bone pain  -muscle pain  This list may not describe all possible side effects. Call your doctor for medical advice about side effects. You may report side effects to FDA at 1-800-FDA-1088.  Where should I keep my medicine?  Keep out of the reach of children.  If you are using this medicine at home, you will be instructed on how to store it. Throw away any unused medicine after the expiration date on the label.  NOTE: This sheet is a summary. It may not cover all possible information. If you have questions about this medicine, talk to your doctor, pharmacist, or health care provider.   2019 [employer] Standard (2017-08-07 16:57:08)

## 2018-08-28 ENCOUNTER — Other Ambulatory Visit: Payer: Self-pay | Admitting: Oncology

## 2018-08-31 ENCOUNTER — Telehealth: Payer: Self-pay | Admitting: *Deleted

## 2018-08-31 ENCOUNTER — Other Ambulatory Visit: Payer: Self-pay | Admitting: Oncology

## 2018-08-31 DIAGNOSIS — C170 Malignant neoplasm of duodenum: Secondary | ICD-10-CM

## 2018-08-31 DIAGNOSIS — K858 Other acute pancreatitis without necrosis or infection: Secondary | ICD-10-CM

## 2018-08-31 MED ORDER — FENTANYL 75 MCG/HR TD PT72: 1.0000 | MEDICATED_PATCH | TRANSDERMAL | 0 refills | Status: DC

## 2018-08-31 MED ORDER — HYDROMORPHONE HCL 4 MG PO TABS: 4.0000 mg | ORAL_TABLET | ORAL | 0 refills | Status: DC | PRN

## 2018-08-31 NOTE — Telephone Encounter (Signed)
Need refill on fentanyl patch and hydromorphone please. MD notified.

## 2018-09-03 ENCOUNTER — Other Ambulatory Visit: Payer: Self-pay

## 2018-09-03 ENCOUNTER — Telehealth: Payer: Self-pay | Admitting: Nurse Practitioner

## 2018-09-03 ENCOUNTER — Inpatient Hospital Stay: Payer: 59

## 2018-09-03 ENCOUNTER — Inpatient Hospital Stay (HOSPITAL_BASED_OUTPATIENT_CLINIC_OR_DEPARTMENT_OTHER): Payer: 59 | Admitting: Nurse Practitioner

## 2018-09-03 ENCOUNTER — Telehealth: Payer: Self-pay | Admitting: *Deleted

## 2018-09-03 ENCOUNTER — Encounter: Payer: Self-pay | Admitting: Nurse Practitioner

## 2018-09-03 VITALS — BP 108/74 | HR 96 | Temp 98.6°F | Resp 19 | Ht 71.0 in | Wt 212.6 lb

## 2018-09-03 DIAGNOSIS — C170 Malignant neoplasm of duodenum: Secondary | ICD-10-CM

## 2018-09-03 DIAGNOSIS — D649 Anemia, unspecified: Secondary | ICD-10-CM

## 2018-09-03 DIAGNOSIS — Z79899 Other long term (current) drug therapy: Secondary | ICD-10-CM

## 2018-09-03 DIAGNOSIS — Z95828 Presence of other vascular implants and grafts: Secondary | ICD-10-CM

## 2018-09-03 DIAGNOSIS — K831 Obstruction of bile duct: Secondary | ICD-10-CM | POA: Diagnosis not present

## 2018-09-03 LAB — CMP (CANCER CENTER ONLY)
ALT: 227 U/L — ABNORMAL HIGH (ref 0–44)
AST: 243 U/L (ref 15–41)
Albumin: 3.4 g/dL — ABNORMAL LOW (ref 3.5–5.0)
Alkaline Phosphatase: 166 U/L — ABNORMAL HIGH (ref 38–126)
Anion gap: 12 (ref 5–15)
BUN: 10 mg/dL (ref 6–20)
CO2: 26 mmol/L (ref 22–32)
Calcium: 9 mg/dL (ref 8.9–10.3)
Chloride: 102 mmol/L (ref 98–111)
Creatinine: 0.71 mg/dL (ref 0.44–1.00)
GFR, Est AFR Am: >60 mL/min (ref >60)
GFR, Estimated: >60 mL/min (ref >60)
Glucose, Bld: 122 mg/dL — ABNORMAL HIGH (ref 70–99)
Potassium: 3.8 mmol/L (ref 3.5–5.1)
Sodium: 140 mmol/L (ref 135–145)
Total Bilirubin: 0.5 mg/dL (ref 0.3–1.2)
Total Protein: 6.5 g/dL (ref 6.5–8.1)

## 2018-09-03 LAB — CBC WITH DIFFERENTIAL (CANCER CENTER ONLY)
Abs Immature Granulocytes: 0.16 10*3/uL — ABNORMAL HIGH (ref 0.00–0.07)
Basophils Absolute: 0 10*3/uL (ref 0.0–0.1)
Basophils Relative: 1 %
Eosinophils Absolute: 0 10*3/uL (ref 0.0–0.5)
Eosinophils Relative: 1 %
HCT: 31.2 % — ABNORMAL LOW (ref 36.0–46.0)
Hemoglobin: 9.7 g/dL — ABNORMAL LOW (ref 12.0–15.0)
Immature Granulocytes: 3 %
Lymphocytes Relative: 31 %
Lymphs Abs: 1.5 10*3/uL (ref 0.7–4.0)
MCH: 27.5 pg (ref 26.0–34.0)
MCHC: 31.1 g/dL (ref 30.0–36.0)
MCV: 88.4 fL (ref 80.0–100.0)
Monocytes Absolute: 1 10*3/uL (ref 0.1–1.0)
Monocytes Relative: 20 %
Neutro Abs: 2.2 10*3/uL (ref 1.7–7.7)
Neutrophils Relative %: 44 %
Platelet Count: 131 10*3/uL — ABNORMAL LOW (ref 150–400)
RBC: 3.53 MIL/uL — ABNORMAL LOW (ref 3.87–5.11)
RDW: 19.4 % — ABNORMAL HIGH (ref 11.5–15.5)
WBC Count: 4.9 10*3/uL (ref 4.0–10.5)
nRBC: 0 % (ref 0.0–0.2)

## 2018-09-03 LAB — CEA (IN HOUSE-CHCC): CEA (CHCC-In House): 4.66 ng/mL (ref 0.00–5.00)

## 2018-09-03 MED ORDER — SODIUM CHLORIDE 0.9% FLUSH
10.0000 mL | Freq: Once | INTRAVENOUS | Status: AC
  Administered 2018-09-03: 12:00:00 10 mL
  Filled 2018-09-03: qty 10

## 2018-09-03 MED ORDER — HEPARIN SOD (PORK) LOCK FLUSH 100 UNIT/ML IV SOLN
500.0000 [IU] | Freq: Once | INTRAVENOUS | Status: AC
  Administered 2018-09-03: 500 [IU]
  Filled 2018-09-03: qty 5

## 2018-09-03 MED ORDER — SODIUM CHLORIDE 0.9% FLUSH
10.0000 mL | Freq: Once | INTRAVENOUS | Status: AC
  Administered 2018-09-03: 10 mL
  Filled 2018-09-03: qty 10

## 2018-09-03 NOTE — Telephone Encounter (Signed)
Scheduled appt per 4/20 los.   Added treatment in the book for approval. (4/27)

## 2018-09-03 NOTE — Progress Notes (Unsigned)
This RN spoke with/notified Ned Card NP re: critical lab value of AST 243.

## 2018-09-03 NOTE — Addendum Note (Signed)
Addended by: Owens Shark on: 09/03/2018 01:22 PM   Modules accepted: Orders

## 2018-09-03 NOTE — Addendum Note (Signed)
Addended by: Will Bonnet on: 09/03/2018 02:00 PM   Modules accepted: Orders

## 2018-09-03 NOTE — Telephone Encounter (Signed)
Patient left VM that she received no paper work or appointments when she left office today nor the date/time of her CT scan. Asking when will her chemo get rescheduled? Called back and left VM explaining that schedulers are off site working and she will be called with her next chemo appointment which should be on 09/10/18. Scan needs to be authorized by her insurance before it can be scheduled. When this is done, central scheduling should be calling her. Sent email to managed care to work on the [place] and to let nurse of Ned Card, NP know when it is approved.

## 2018-09-03 NOTE — Progress Notes (Addendum)
[facility] OFFICE PROGRESS NOTE   Diagnosis: Small bowel carcinoma  INTERVAL HISTORY:   Hailey Houston returns as scheduled.  She completed cycle 4 FOLFOX 08/21/2018.  She had less nausea overall.  She continues to vomit intermittently.  She feels Phenergan was more effective than some of the other nausea medication she has taken in the past.  No mouth sores.  No diarrhea.  She has persistent cold sensitivity.  No numbness or tingling in the absence of cold exposure.  She continues to have abdominal pain.  She takes pain medication 2 or 3 times a day.  Objective:  Vital signs in last 24 hours:  Blood pressure 108/74, pulse 96, temperature 98.6 F (37 C), temperature source Oral, resp. rate 19, height _0  (1.803 m), weight 212 lb 9.6 oz (96.4 kg), SpO2 99 %.    HEENT: No thrush or ulcers. Resp: Respirations even and unlabored. Vascular: No leg edema. Neuro: Vibratory sense intact over the fingertips per tuning fork exam. Skin: Palms without erythema. Port-A-Cath without erythema.   Lab Results:  Lab Results  Component Value Date   WBC 4.9 09/03/2018   HGB 9.7 (L) 09/03/2018   HCT 31.2 (L) 09/03/2018   MCV 88.4 09/03/2018   PLT 131 (L) 09/03/2018   NEUTROABS 2.2 09/03/2018    Imaging:  No results found.  Medications: I have reviewed the patient's current medications.  Assessment/Plan: 1. Adenocarcinoma of the duodenum  CT abdomen/pelvis 06/16/2018-changes of pancreatitis, fullness at the head of the pancreas without a defined mass, multiple subcentimeter peripancreatic lymph nodes, distal common bile duct stent, multiple nodules at the lung bases  EUS 06/14/2018-ulcerated mucosa/mass at the distal duodenal bulb with extension to the pancreas head, portal adenopathy, biopsy of duodenal mass-adenocarcinoma;foundation 1-microsatellite stable, tumor mutational burden 1, KRASG12D mutation  ERCP 06/14/2018-distal duodenal bulb mass, 1 cm proximal to the  ampulla causing biliary obstruction, status post placement of a metal common bile duct stent  CT chest 06/20/2018-bilateral pulmonary nodules consistent with metastatic disease, supraclavicular, mediastinal, and hilar lymphadenopathy, changes of pancreatitis  Ultrasound-guided biopsy of a left supraclavicular lymph node 06/22/2018-metastatic adenocarcinoma  Cycle 1 FOLFOX 07/03/2018  Cycle 2 FOLFOX 07/17/2018  Cycle 3 FOLFOX 08/06/2018, Neulasta added  Cycle 4 FOLFOX 08/21/2018   2.Biliary obstruction secondary to the duodenal mass, status post placement of a bile duct stent 06/14/2018 3.Post ERCP pancreatitis 06/16/2018 4.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 Abdominal pain- secondary to the small bowel carcinoma versus pancreatitis  9. Rash-improved 10.  Neutropenia secondary to chemotherapy 07/31/2018 11.  Elevated transaminases    Disposition: Ms. Suchy appears stable.  She has completed 4 cycles of FOLFOX.  She had less nausea following cycle 4 than with previous cycles.  We reviewed the labs from today.  Chemistry panel shows progressive elevation of the transaminases.  Question related to chemotherapy, question early stent obstruction.  Chemotherapy will be held today.  We are referring her for restaging CT scans later this week.  She will return for a follow-up visit, possible chemotherapy in 1 week.  She will contact the office in the interim with any problems.  We specifically discussed fever, chills.  Patient seen with Dr. Benay Spice.  Ned Card ANP/GNP-BC   09/03/2018  12:06 PM  This was a shared visit with Ned Card.  Ms. Salvador has developed more increased elevation of the  liver enzymes.  This could be related to recurrent biliary obstruction.  I reviewed the case with Dr.  Ardis Hughs.  The plan is to obtain a restaging CT this week.  Chemotherapy will be placed on hold.  Julieanne Manson, MD

## 2018-09-05 ENCOUNTER — Telehealth: Payer: Self-pay | Admitting: *Deleted

## 2018-09-05 NOTE — Telephone Encounter (Signed)
Notified patient that PA is completed on her scan. Provided her with phone # for central scheduling to make the appointment. She will pick up contrast at front desk today.

## 2018-09-06 ENCOUNTER — Ambulatory Visit (HOSPITAL_COMMUNITY)
Admission: RE | Admit: 2018-09-06 | Discharge: 2018-09-06 | Disposition: A | Discharge status: Home / Self Care | Payer: 59 | Source: Ambulatory Visit | Attending: Nurse Practitioner | Admitting: Nurse Practitioner

## 2018-09-06 ENCOUNTER — Other Ambulatory Visit: Payer: Self-pay

## 2018-09-06 ENCOUNTER — Telehealth: Payer: Self-pay | Admitting: Gastroenterology

## 2018-09-06 DIAGNOSIS — C170 Malignant neoplasm of duodenum: Secondary | ICD-10-CM | POA: Insufficient documentation

## 2018-09-06 MED ORDER — SODIUM CHLORIDE (PF) 0.9 % IJ SOLN
INTRAMUSCULAR | Status: AC
  Filled 2018-09-06: qty 50

## 2018-09-06 MED ORDER — IOHEXOL 300 MG/ML  SOLN
100.0000 mL | Freq: Once | INTRAMUSCULAR | Status: AC | PRN
  Administered 2018-09-06: 100 mL via INTRAVENOUS

## 2018-09-06 NOTE — Telephone Encounter (Signed)
Please call her.  I spoke with Dr. Benay Spice, her liver tests have increased a bit.  He is planning ot repeat them tomorrow and send the results to me.  I want to see her for virtual visit on Tuesday 28th in pm, can you please arrange that visit, thanks  To discuss lfts, where did her stent go

## 2018-09-06 NOTE — Telephone Encounter (Signed)
Patient is scheduled for 09/11/18 4:00.

## 2018-09-07 ENCOUNTER — Other Ambulatory Visit: Payer: Self-pay

## 2018-09-07 ENCOUNTER — Telehealth: Payer: Self-pay | Admitting: Nurse Practitioner

## 2018-09-07 ENCOUNTER — Other Ambulatory Visit: Payer: Self-pay | Admitting: Nurse Practitioner

## 2018-09-07 ENCOUNTER — Inpatient Hospital Stay: Payer: 59

## 2018-09-07 DIAGNOSIS — C170 Malignant neoplasm of duodenum: Secondary | ICD-10-CM | POA: Diagnosis not present

## 2018-09-07 LAB — CMP (CANCER CENTER ONLY)
ALT: 121 U/L — ABNORMAL HIGH (ref 0–44)
AST: 88 U/L — ABNORMAL HIGH (ref 15–41)
Albumin: 3.4 g/dL — ABNORMAL LOW (ref 3.5–5.0)
Alkaline Phosphatase: 147 U/L — ABNORMAL HIGH (ref 38–126)
Anion gap: 11 (ref 5–15)
BUN: 9 mg/dL (ref 6–20)
CO2: 27 mmol/L (ref 22–32)
Calcium: 8.8 mg/dL — ABNORMAL LOW (ref 8.9–10.3)
Chloride: 105 mmol/L (ref 98–111)
Creatinine: 0.76 mg/dL (ref 0.44–1.00)
GFR, Est AFR Am: >60 mL/min (ref >60)
GFR, Estimated: >60 mL/min (ref >60)
Glucose, Bld: 112 mg/dL — ABNORMAL HIGH (ref 70–99)
Potassium: 4.1 mmol/L (ref 3.5–5.1)
Sodium: 143 mmol/L (ref 135–145)
Total Bilirubin: 0.4 mg/dL (ref 0.3–1.2)
Total Protein: 6.6 g/dL (ref 6.5–8.1)

## 2018-09-07 NOTE — Telephone Encounter (Signed)
Scheduled appt per 4/24 sch message - unable to reach patient . Left message with appt date and time

## 2018-09-10 ENCOUNTER — Other Ambulatory Visit: Payer: Self-pay

## 2018-09-10 ENCOUNTER — Inpatient Hospital Stay: Payer: 59

## 2018-09-10 ENCOUNTER — Other Ambulatory Visit: Payer: 59

## 2018-09-10 ENCOUNTER — Encounter: Payer: Self-pay | Admitting: Nurse Practitioner

## 2018-09-10 ENCOUNTER — Ambulatory Visit: Payer: 59 | Admitting: Nurse Practitioner

## 2018-09-10 ENCOUNTER — Telehealth: Payer: Self-pay | Admitting: Nurse Practitioner

## 2018-09-10 ENCOUNTER — Inpatient Hospital Stay (HOSPITAL_BASED_OUTPATIENT_CLINIC_OR_DEPARTMENT_OTHER): Payer: 59 | Admitting: Nurse Practitioner

## 2018-09-10 VITALS — BP 130/76 | HR 95 | Temp 98.1°F | Resp 18 | Ht 71.0 in | Wt 206.6 lb

## 2018-09-10 DIAGNOSIS — C170 Malignant neoplasm of duodenum: Secondary | ICD-10-CM

## 2018-09-10 DIAGNOSIS — K831 Obstruction of bile duct: Secondary | ICD-10-CM

## 2018-09-10 DIAGNOSIS — D649 Anemia, unspecified: Secondary | ICD-10-CM | POA: Diagnosis not present

## 2018-09-10 DIAGNOSIS — Z95828 Presence of other vascular implants and grafts: Secondary | ICD-10-CM

## 2018-09-10 DIAGNOSIS — Z79899 Other long term (current) drug therapy: Secondary | ICD-10-CM

## 2018-09-10 LAB — CBC WITH DIFFERENTIAL (CANCER CENTER ONLY)
Abs Immature Granulocytes: 0.07 10*3/uL (ref 0.00–0.07)
Basophils Absolute: 0 10*3/uL (ref 0.0–0.1)
Basophils Relative: 0 %
Eosinophils Absolute: 0 10*3/uL (ref 0.0–0.5)
Eosinophils Relative: 0 %
HCT: 34.1 % — ABNORMAL LOW (ref 36.0–46.0)
Hemoglobin: 10.7 g/dL — ABNORMAL LOW (ref 12.0–15.0)
Immature Granulocytes: 1 %
Lymphocytes Relative: 22 %
Lymphs Abs: 1.5 10*3/uL (ref 0.7–4.0)
MCH: 27.4 pg (ref 26.0–34.0)
MCHC: 31.4 g/dL (ref 30.0–36.0)
MCV: 87.4 fL (ref 80.0–100.0)
Monocytes Absolute: 0.7 10*3/uL (ref 0.1–1.0)
Monocytes Relative: 11 %
Neutro Abs: 4.5 10*3/uL (ref 1.7–7.7)
Neutrophils Relative %: 66 %
Platelet Count: 283 10*3/uL (ref 150–400)
RBC: 3.9 MIL/uL (ref 3.87–5.11)
RDW: 21.5 % — ABNORMAL HIGH (ref 11.5–15.5)
WBC Count: 6.8 10*3/uL (ref 4.0–10.5)
nRBC: 0 % (ref 0.0–0.2)

## 2018-09-10 LAB — CMP (CANCER CENTER ONLY)
ALT: 94 U/L — ABNORMAL HIGH (ref 0–44)
AST: 80 U/L — ABNORMAL HIGH (ref 15–41)
Albumin: 3.6 g/dL (ref 3.5–5.0)
Alkaline Phosphatase: 143 U/L — ABNORMAL HIGH (ref 38–126)
Anion gap: 10 (ref 5–15)
BUN: 12 mg/dL (ref 6–20)
CO2: 24 mmol/L (ref 22–32)
Calcium: 9.3 mg/dL (ref 8.9–10.3)
Chloride: 106 mmol/L (ref 98–111)
Creatinine: 0.76 mg/dL (ref 0.44–1.00)
GFR, Est AFR Am: >60 mL/min (ref >60)
GFR, Estimated: >60 mL/min (ref >60)
Glucose, Bld: 139 mg/dL — ABNORMAL HIGH (ref 70–99)
Potassium: 3.8 mmol/L (ref 3.5–5.1)
Sodium: 140 mmol/L (ref 135–145)
Total Bilirubin: 0.5 mg/dL (ref 0.3–1.2)
Total Protein: 7.1 g/dL (ref 6.5–8.1)

## 2018-09-10 MED ORDER — PALONOSETRON HCL INJECTION 0.25 MG/5ML
INTRAVENOUS | Status: AC
  Filled 2018-09-10: qty 5

## 2018-09-10 MED ORDER — SODIUM CHLORIDE 0.9% FLUSH
10.0000 mL | INTRAVENOUS | Status: DC | PRN
  Filled 2018-09-10: qty 10

## 2018-09-10 MED ORDER — SODIUM CHLORIDE 0.9 % IV SOLN
Freq: Once | INTRAVENOUS | Status: AC
  Administered 2018-09-10: 13:00:00 via INTRAVENOUS
  Filled 2018-09-10: qty 5

## 2018-09-10 MED ORDER — SODIUM CHLORIDE 0.9% FLUSH
10.0000 mL | Freq: Once | INTRAVENOUS | Status: AC
  Administered 2018-09-10: 10 mL
  Filled 2018-09-10: qty 10

## 2018-09-10 MED ORDER — DEXTROSE 5 % IV SOLN
Freq: Once | INTRAVENOUS | Status: AC
  Administered 2018-09-10: 13:00:00 via INTRAVENOUS
  Filled 2018-09-10: qty 250

## 2018-09-10 MED ORDER — LEUCOVORIN CALCIUM INJECTION 350 MG
400.0000 mg/m2 | Freq: Once | INTRAVENOUS | Status: AC
  Administered 2018-09-10: 14:00:00 872 mg via INTRAVENOUS
  Filled 2018-09-10: qty 43.6

## 2018-09-10 MED ORDER — OXALIPLATIN CHEMO INJECTION 100 MG/20ML
65.0000 mg/m2 | Freq: Once | INTRAVENOUS | Status: AC
  Administered 2018-09-10: 14:00:00 140 mg via INTRAVENOUS
  Filled 2018-09-10: qty 10

## 2018-09-10 MED ORDER — PALONOSETRON HCL INJECTION 0.25 MG/5ML
0.2500 mg | Freq: Once | INTRAVENOUS | Status: AC
  Administered 2018-09-10: 13:00:00 0.25 mg via INTRAVENOUS

## 2018-09-10 MED ORDER — FLUOROURACIL CHEMO INJECTION 2.5 GM/50ML
400.0000 mg/m2 | Freq: Once | INTRAVENOUS | Status: AC
  Administered 2018-09-10: 17:00:00 850 mg via INTRAVENOUS
  Filled 2018-09-10: qty 17

## 2018-09-10 MED ORDER — SODIUM CHLORIDE 0.9 % IV SOLN
2400.0000 mg/m2 | INTRAVENOUS | Status: DC
  Administered 2018-09-10: 5250 mg via INTRAVENOUS
  Filled 2018-09-10: qty 105

## 2018-09-10 MED ORDER — HEPARIN SOD (PORK) LOCK FLUSH 100 UNIT/ML IV SOLN
500.0000 [IU] | Freq: Once | INTRAVENOUS | Status: DC | PRN
  Filled 2018-09-10: qty 5

## 2018-09-10 NOTE — Progress Notes (Addendum)
[facility] OFFICE PROGRESS NOTE   Diagnosis: Small bowel carcinoma  INTERVAL HISTORY:   Hailey Houston returns as scheduled.  She completed cycle 4 FOLFOX 08/21/2018.  Cycle 5 was held on 09/03/2018 due to elevated liver enzymes.  She was referred for CT scans.  She reports persistent underlying nausea.  This is unchanged as compared to previous visits.  She continues to have abdominal pain.  She is on the Duragesic patch and takes breakthrough pain medication 4-5 times a day.  She has noted her urine is darker.  She reports good fluid intake.  She has mild persistent cold sensitivity.  No fever, cough, shortness of breath.  Objective:  Vital signs in last 24 hours:  Blood pressure 130/76, pulse 95, temperature 98.1 F (36.7 C), temperature source Oral, resp. rate 18, height 5' 11"  (1.803 m), weight 206 lb 9.6 oz (93.7 kg), SpO2 98 %.    HEENT: No thrush or ulcers. Vascular: No leg edema. Neuro: Vibratory sense intact over the fingertips per tuning fork exam. Skin: Palms without erythema. Port-A-Cath without erythema.   Lab Results:  Lab Results  Component Value Date   WBC 6.8 09/10/2018   HGB 10.7 (L) 09/10/2018   HCT 34.1 (L) 09/10/2018   MCV 87.4 09/10/2018   PLT 283 09/10/2018   NEUTROABS 4.5 09/10/2018    Imaging:  No results found.  Medications: I have reviewed the patient's current medications.  Assessment/Plan: 1.Adenocarcinoma of the duodenum  CT abdomen/pelvis 06/16/2018-changes of pancreatitis, fullness at the head of the pancreas without a defined mass, multiple subcentimeter peripancreatic lymph nodes, distal common bile duct stent, multiple nodules at the lung bases  EUS 06/14/2018-ulcerated mucosa/mass at the distal duodenal bulb with extension to the pancreas head, portal adenopathy, biopsy of duodenal mass-adenocarcinoma;foundation 1-microsatellite stable, tumor mutational burden 1, KRASG12D mutation  ERCP 06/14/2018-distal duodenal bulb  mass, 1 cm proximal to the ampulla causing biliary obstruction, status post placement of a metal common bile duct stent  CT chest 06/20/2018-bilateral pulmonary nodules consistent with metastatic disease, supraclavicular, mediastinal, and hilar lymphadenopathy, changes of pancreatitis  Ultrasound-guided biopsy of a left supraclavicular lymph node 06/22/2018-metastatic adenocarcinoma  Cycle 1 FOLFOX 07/03/2018  Cycle 2 FOLFOX 07/17/2018  Cycle 3 FOLFOX 08/06/2018, Neulasta added  Cycle 4 FOLFOX 08/21/2018  CTs 09/06/2018- numerous small lung nodules generally decreased in size; new rounded masslike subpleural opacity right upper lobe with some evidence of internal cavitation, measuring 4.0 cm.  Minimal fat stranding about the pancreas and duodenum which is decreased.  Discrete mass is not noted.  Previously seen common bile duct stent absent.  Gallbladder wall thickening with faintly calcified gallstones.  No biliary ductal dilatation.  Cycle 5 FOLFOX 09/10/2018 (oxaliplatin dose reduced)   2.Biliary obstruction secondary to the duodenal mass, status post placement of a bile duct stent 06/14/2018 3.Post ERCP pancreatitis 06/16/2018 4.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 Abdominal pain- secondary to the small bowel carcinoma versus pancreatitis  9. Rash-improved 10. Neutropenia secondary to chemotherapy 07/31/2018 11.  Elevated transaminases; improved 09/07/2018; improved 09/10/2018.  Question related to chemotherapy.   Disposition: Hailey Houston appears stable.  She has completed 4 cycles of FOLFOX.  Cycle 5 FOLFOX was held last week due to elevated liver enzymes.  She was referred for CT scans which showed improvement in lung nodules.  Review of labs today show the liver enzymes  are better.  Question  related to chemotherapy.  Plan to proceed with cycle 5 FOLFOX today as scheduled.  Oxaliplatin will be dose reduced.  A new masslike subpleural opacity was noted in the right upper lung.  This is likely related to infection/inflammation.  She does not have symptoms of infection.  We will continue to monitor.  On the CT scan the previously placed biliary stent is noted to be absent.  She has an appointment with Dr. Ardis Hughs tomorrow to discuss further.  She will return for lab, follow-up and chemotherapy in 2 weeks.  She will contact the office in the interim with any problems.  Patient seen with Dr. Benay Spice.  CT images reviewed on the computer with Hailey Houston.  25 minutes were spent face-to-face at today's visit with the majority of that time involved in counseling/coordination of care.    Ned Card ANP/GNP-BC   09/10/2018  12:24 PM This was a shared visit with Ned Card.  We reviewed the restaging CT images with Hailey Houston.  Many of the lung nodules appear smaller.  The etiology of the subpleural lesion in the right lung is unclear.  The liver enzymes are improved.  The liver enzyme elevation last week may have been related to toxicity from oxaliplatin, transient infection, or transient biliary obstruction.  She will discuss the biliary stent with Dr. Ardis Hughs tomorrow.  We decided to proceed with FOLFOX.  The oxaliplatin will be dose reduced secondary to the elevated liver enzymes  Julieanne Manson, MD

## 2018-09-10 NOTE — Patient Instructions (Signed)
[facility] Discharge Instructions for Patients Receiving Chemotherapy  Today you received the following chemotherapy agents: Oxaliplatin, Leucovorin, and 5FU.  To help prevent nausea and vomiting after your treatment, we encourage you to take your nausea medication as directed.   If you develop nausea and vomiting that is not controlled by your nausea medication, call the clinic.   BELOW ARE SYMPTOMS THAT SHOULD BE REPORTED IMMEDIATELY:  *FEVER GREATER THAN 100.5 F  *CHILLS WITH OR WITHOUT FEVER  NAUSEA AND VOMITING THAT IS NOT CONTROLLED WITH YOUR NAUSEA MEDICATION  *UNUSUAL SHORTNESS OF BREATH  *UNUSUAL BRUISING OR BLEEDING  TENDERNESS IN MOUTH AND THROAT WITH OR WITHOUT PRESENCE OF ULCERS  *URINARY PROBLEMS  *BOWEL PROBLEMS  UNUSUAL RASH Items with * indicate a potential emergency and should be followed up as soon as possible.  Feel free to call the clinic should you have any questions or concerns. The clinic phone number is [phone].  Please show the CHEMO ALERT CARD at check-in to the Emergency Department and triage nurse.    

## 2018-09-10 NOTE — Telephone Encounter (Signed)
Scheduled appt per 4/27 los.  [facility] did not any availability, scheduled appt with Lattie Haw.

## 2018-09-10 NOTE — Progress Notes (Signed)
MD review of labs today: OK to treat with elevated AST/ALT

## 2018-09-11 ENCOUNTER — Encounter: Payer: Self-pay | Admitting: Gastroenterology

## 2018-09-11 ENCOUNTER — Ambulatory Visit (INDEPENDENT_AMBULATORY_CARE_PROVIDER_SITE_OTHER): Payer: 59 | Admitting: Gastroenterology

## 2018-09-11 ENCOUNTER — Ambulatory Visit (INDEPENDENT_AMBULATORY_CARE_PROVIDER_SITE_OTHER)
Admission: RE | Admit: 2018-09-11 | Discharge: 2018-09-11 | Disposition: A | Discharge status: Home / Self Care | Payer: 59 | Source: Ambulatory Visit | Attending: Gastroenterology | Admitting: Gastroenterology

## 2018-09-11 VITALS — BP 108/74 | HR 96 | Ht 71.0 in | Wt 212.0 lb

## 2018-09-11 DIAGNOSIS — C170 Malignant neoplasm of duodenum: Secondary | ICD-10-CM

## 2018-09-11 NOTE — Patient Instructions (Addendum)
She needs a KUB flat and upright to look for the previously placed metal bile duct stent that has migrated.  She is aware that if she has significant abdominal pains, abdominal tenderness or distention she should call my office.

## 2018-09-11 NOTE — Progress Notes (Signed)
This service was provided via virtual visit.  Both audio and visual were used.   The patient was located at home.  I was located in my office.  The patient did consent to this virtual visit and is aware of possible charges through their insurance for this visit.  The patient is an established patient.  My certified medical assistant, Grace Bushy, contributed to this visit by contacting the patient by phone 1 or 2 business days prior to the appointment and also followed up on the recommendations I made after the visit.  Time spent on virtual visit: 23 min   HPI: This is a very pleasant 48 year old woman   I last saw her at the time of endoscopic ultrasound and ERCP June 14, 2018.  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  Imaging showed that it was clearly metastatic to the chest.  I placed a fully covered metal stent in her bile duct because she was obstructed, jaundiced from the adenocarcinoma in the duodenum.  She suffered mild to moderate acute pancreatitis following the procedures and was hospitalized for several days recovering.  She has now completed 4 cycles of FOLFOX. Currently getting her 5th cycle (runs for 2 days at a time).  Her oncologist called me last week because her liver tests were elevated.  Repeat imaging was done by CT scan, see that below.  Notable is that she seems to be responding to chemotherapy.  The fat stranding around the pancreas and duodenum is decreased.  The previously placed metal biliary stent was nowhere to be seen.  I reviewed the images myself and could not find the stent certainly not in its previous position and nowhere else in the belly either.  Oral contrast might be camouflaging it, however I could not see it on the CT scout film either.  About 2 weeks ago she felt terrible, worse nausea.  She has had some explosive bowel movements.  She has had no necessarily fevers or chills and no serious terrible abdominal pains.  She  cannot confirm that she has ever passed the stent but admits she does not look through her BMs at all   CT scan 09/06/2018: 1. Minimal fat stranding about the pancreas and duodenum, which is decreased compared to prior examination. A discrete mass is not noted by CT. A previously seen common bile duct stent is now absent.  2. Interval decrease in size of numerous small metastatic pulmonary nodules and prominent mediastinal lymph nodes. Findings are generally consistent with treatment response.  3. There is a new rounded masslike subpleural opacity of the dependent right upper lobe with some evidence of internal cavitation, measuring 4.0 cm (series 4, image 120). This is likely infectious or inflammatory, perhaps related to aspiration. Attention on follow-up.  4. Gallbladder wall thickening with faintly calcified gallstones. No biliary ductal dilatation. Correlate for evidence of acute cholecystitis.  5.  Chronic and incidental findings as detailed above.   Complete metabolic profile yesterday AST 80, ALT 94, alk phos 143.  8 days ago these numbers were 243, 227 and 166 respectively    Chief complaint is duodenal adenocarcinoma  ROS: complete GI ROS as described in HPI, all other review negative.  Constitutional:  No unintentional weight loss   Past Medical History:  Diagnosis Date  . ADD (attention deficit disorder)   . Adenocarcinoma of duodenum ([place]) 06/16/2018  . Anxiety   . Family history of adverse reaction to anesthesia    mom goes  into afib  . Wears glasses    driving    Past Surgical History:  Procedure Laterality Date  . BILIARY STENT PLACEMENT N/A 06/14/2018   Procedure: BILIARY STENT PLACEMENT;  Surgeon: Milus Banister, MD;  Location: [facility];  Service: Endoscopy;  Laterality: N/A;  . BIOPSY  06/14/2018   Procedure: BIOPSY;  Surgeon: Milus Banister, MD;  Location: [facility];  Service: Endoscopy;;  . ENDOSCOPIC RETROGRADE  CHOLANGIOPANCREATOGRAPHY (ERCP) WITH PROPOFOL N/A 06/14/2018   Procedure: ENDOSCOPIC RETROGRADE CHOLANGIOPANCREATOGRAPHY (ERCP) WITH PROPOFOL;  Surgeon: Milus Banister, MD;  Location: [facility];  Service: Endoscopy;  Laterality: N/A;  . ESOPHAGOGASTRODUODENOSCOPY N/A 06/14/2018   Procedure: ESOPHAGOGASTRODUODENOSCOPY (EGD);  Surgeon: Milus Banister, MD;  Location: [facility];  Service: Endoscopy;  Laterality: N/A;  . EUS N/A 06/14/2018   Procedure: UPPER ENDOSCOPIC ULTRASOUND (EUS) RADIAL;  Surgeon: Milus Banister, MD;  Location: [facility];  Service: Endoscopy;  Laterality: N/A;  . IR IMAGING GUIDED PORT INSERTION  07/06/2018  . ORIF ANKLE FRACTURE Left 08/29/2013   Procedure: LEFT FIBULA -FRACTURE OPEN TREATMENT DISTAL FIBULAR (LATERALERAL MALLEOLUS) INCLUDES INTERNAL FIXATION, ;  Surgeon: [name], MD;  Location: [facility];  Service: Orthopedics;  Laterality: Left;  . PERINEAL LACERATION REPAIR     post childbirth  . SPHINCTEROTOMY  06/14/2018   Procedure: SPHINCTEROTOMY;  Surgeon: Milus Banister, MD;  Location: [facility];  Service: Endoscopy;;  . TIBIA IM NAIL INSERTION Left 08/29/2013   Procedure: FRACTURE TREATMENT TIBIAL SHAFT BY INTRAMEDULLARY IMPLANT WITH SCREWS ;  Surgeon: [name], MD;  Location: [facility];  Service: Orthopedics;  Laterality: Left;    Current Outpatient Medications  Medication Sig Dispense Refill  . amphetamine-dextroamphetamine (ADDERALL) 30 MG tablet Take 30 mg by mouth 2 (two) times daily.    . clobetasol cream (TEMOVATE) 0.05 % Apply topically 2 (two) times daily. 30 g 0  . dexamethasone (DECADRON) 4 MG tablet Take 2 tablets (8 mg total) by mouth 2 (two) times daily with a meal. For three days after each chemo. Start on day after IV chemo given 24 tablet 2  . famotidine (PEPCID) 20 MG tablet Take 1 tablet (20 mg total) by mouth 2 (two) times daily. 60 tablet 1  . fentaNYL (DURAGESIC) 75 MCG/HR Place 1  patch onto the skin every 3 (three) days. 5 patch 0  . HYDROmorphone (DILAUDID) 4 MG tablet Take 1-2 tablets (4-8 mg total) by mouth every 4 (four) hours as needed for severe pain. 60 tablet 0  . hydrOXYzine (ATARAX/VISTARIL) 50 MG tablet Take 1 tablet (50 mg total) by mouth 3 (three) times daily as needed for itching. 30 tablet 1  . lidocaine-prilocaine (EMLA) cream Apply 1 application topically as directed. 1 hour prior to port stick and cover with plastic wrap 30 g 1  . LORazepam (ATIVAN) 0.5 MG tablet TAKE 1 TABLET EVERY 6 HOURS AS NEEDED FOR ANXIETY OR NAUSEA 60 tablet 1  . ondansetron (ZOFRAN) 8 MG tablet Take 1 tablet every 8 hours as needed for nausea. 30 tablet 1  . pantoprazole (PROTONIX) 40 MG tablet Take 1 tablet (40 mg total) by mouth 2 (two) times daily. 60 tablet 1  . Polyethylene Glycol 3350 (MIRALAX PO) Take 17 g by mouth daily as needed.     . potassium chloride SA (KLOR-CON M20) 20 MEQ tablet Take 1 tablet (20 mEq total) by mouth daily. 30 tablet 0  . prochlorperazine (COMPAZINE) 10 MG tablet TAKE 1  TABLET BY MOUTH EVERY 6 HOURS AS NEEDED FOR NAUSEA AND VOMITING 60 tablet 1  . promethazine (PHENERGAN) 12.5 MG tablet Take 1-2 tablets (12.5-25 mg total) by mouth every 6 (six) hours as needed for nausea or vomiting. Do not take with Compazine 60 tablet 1  . triamcinolone cream (KENALOG) 0.1 % Apply topically 3 (three) times daily. 30 g 0   No current facility-administered medications for this visit.    Facility-Administered Medications Ordered in Other Visits  Medication Dose Route Frequency Provider Last Rate Last Dose  . sodium chloride flush (NS) 0.9 % injection 10 mL  10 mL Intravenous PRN Ladell Pier, MD   10 mL at 07/02/18 0915  . sodium chloride flush (NS) 0.9 % injection 10 mL  10 mL Intravenous PRN Ladell Pier, MD   10 mL at 07/02/18 0916    Allergies as of 09/11/2018 - Review Complete 09/11/2018  Allergen Reaction Noted  . Oxycodone Itching 08/28/2013     Family History  Problem Relation Age of Onset  . Heart disease Mother   . Heart disease Father     Social History   Socioeconomic History  . Marital status: Divorced    Spouse name: Not on file  . Number of children: Not on file  . Years of education: Not on file  . Highest education level: Not on file  Occupational History  . Not on file  Social Needs  . Financial resource strain: Not on file  . Food insecurity:    Worry: Not on file    Inability: Not on file  . Transportation needs:    Medical: Not on file    Non-medical: Not on file  Tobacco Use  . Smoking status: Current Some Day Smoker  . Smokeless tobacco: Never Used  Substance and Sexual Activity  . Alcohol use: Yes    Comment: occ  . Drug use: No  . Sexual activity: Not Currently    Comment: a pk/week  Lifestyle  . Physical activity:    Days per week: Not on file    Minutes per session: Not on file  . Stress: Not on file  Relationships  . Social connections:    Talks on phone: Not on file    Gets together: Not on file    Attends religious service: Not on file    Active member of club or organization: Not on file    Attends meetings of clubs or organizations: Not on file    Relationship status: Not on file  . Intimate partner violence:    Fear of current or ex partner: Not on file    Emotionally abused: Not on file    Physically abused: Not on file    Forced sexual activity: Not on file  Other Topics Concern  . Not on file  Social History Narrative  . Not on file     Physical Exam: Unable to perform because this was a "telemed visit" due to current Covid-19 pandemic  Assessment and plan: 48 y.o. female with duodenal adenocarcinoma  First I think it is very encouraging that her chest metastasis as well as the primary site seem to be responding to chemotherapy.  Her recent LFT elevation is clearly improving.  I think it is unlikely that she has biliary obstruction right now.  The previously  placed fully covered and removable metal biliary stent has obviously migrated out of the bile duct.  It may have passed from her body.  I do  not think she needs a biliary stent replaced currently.  Certainly if, when the need arises then she understands she would be at risk again for pancreatitis following the ERCP.    We will order an abdominal plain film see if we can locate the previously placed metal biliary stent that has migrated.  I might suggest endoscopic retrieval depending on its position if it is still indeed in her body.  She knows to look out for abdominal distention, serious abdominal pains or tenderness as these could be a sign of stent complication further.  Please see the "Patient Instructions" section for addition details about the plan.  Owens Loffler, MD [facility] 09/11/2018, 12:13 PM

## 2018-09-12 ENCOUNTER — Inpatient Hospital Stay: Payer: 59

## 2018-09-12 ENCOUNTER — Other Ambulatory Visit: Payer: Self-pay

## 2018-09-12 VITALS — BP 127/81 | HR 96 | Temp 98.1°F | Resp 18

## 2018-09-12 DIAGNOSIS — C170 Malignant neoplasm of duodenum: Secondary | ICD-10-CM | POA: Diagnosis not present

## 2018-09-12 MED ORDER — SODIUM CHLORIDE 0.9% FLUSH
10.0000 mL | INTRAVENOUS | Status: DC | PRN
  Administered 2018-09-12: 10 mL
  Filled 2018-09-12: qty 10

## 2018-09-12 MED ORDER — PEGFILGRASTIM INJECTION 6 MG/0.6ML ~~LOC~~
6.0000 mg | PREFILLED_SYRINGE | Freq: Once | SUBCUTANEOUS | Status: AC
  Administered 2018-09-12: 15:00:00 6 mg via SUBCUTANEOUS

## 2018-09-12 MED ORDER — HEPARIN SOD (PORK) LOCK FLUSH 100 UNIT/ML IV SOLN
500.0000 [IU] | Freq: Once | INTRAVENOUS | Status: AC | PRN
  Administered 2018-09-12: 500 [IU]
  Filled 2018-09-12: qty 5

## 2018-09-17 ENCOUNTER — Other Ambulatory Visit: Payer: Self-pay | Admitting: Oncology

## 2018-09-17 DIAGNOSIS — C170 Malignant neoplasm of duodenum: Secondary | ICD-10-CM

## 2018-09-18 ENCOUNTER — Telehealth: Payer: Self-pay | Admitting: *Deleted

## 2018-09-18 ENCOUNTER — Other Ambulatory Visit: Payer: Self-pay | Admitting: Nurse Practitioner

## 2018-09-18 DIAGNOSIS — K858 Other acute pancreatitis without necrosis or infection: Secondary | ICD-10-CM

## 2018-09-18 DIAGNOSIS — C170 Malignant neoplasm of duodenum: Secondary | ICD-10-CM

## 2018-09-18 MED ORDER — FENTANYL 75 MCG/HR TD PT72: 1.0000 | MEDICATED_PATCH | TRANSDERMAL | 0 refills | Status: DC

## 2018-09-18 MED ORDER — PROMETHAZINE HCL 12.5 MG PO TABS: 12.5000 mg | ORAL_TABLET | Freq: Four times a day (QID) | ORAL | 0 refills | Status: DC | PRN

## 2018-09-18 MED ORDER — LORAZEPAM 0.5 MG PO TABS: ORAL_TABLET | ORAL | 0 refills | Status: DC

## 2018-09-18 MED ORDER — HYDROMORPHONE HCL 4 MG PO TABS: 4.0000 mg | ORAL_TABLET | ORAL | 0 refills | Status: DC | PRN

## 2018-09-18 NOTE — Telephone Encounter (Addendum)
Patient visiting her mother in [place] and needs the following refills sent to [facility] on [address]. [address] in [place]: Duragesic patch: puts on last patch tomorrow--note to MD to order Dilaudid 4 mg--not to MD to order. Lorazepam 0.5 mg---called in for patient Promethazine 12.5 mg--escribed

## 2018-09-23 ENCOUNTER — Other Ambulatory Visit: Payer: Self-pay | Admitting: Oncology

## 2018-09-23 ENCOUNTER — Other Ambulatory Visit: Payer: Self-pay | Admitting: Physician Assistant

## 2018-09-24 ENCOUNTER — Inpatient Hospital Stay: Payer: 59

## 2018-09-24 ENCOUNTER — Inpatient Hospital Stay: Payer: 59 | Attending: Oncology

## 2018-09-24 ENCOUNTER — Telehealth: Payer: Self-pay | Admitting: Nurse Practitioner

## 2018-09-24 ENCOUNTER — Encounter: Payer: Self-pay | Admitting: Nurse Practitioner

## 2018-09-24 ENCOUNTER — Other Ambulatory Visit: Payer: Self-pay

## 2018-09-24 ENCOUNTER — Inpatient Hospital Stay (HOSPITAL_BASED_OUTPATIENT_CLINIC_OR_DEPARTMENT_OTHER): Payer: 59 | Admitting: Nurse Practitioner

## 2018-09-24 VITALS — BP 127/75 | HR 97 | Temp 97.8°F | Resp 18 | Ht 71.0 in | Wt 218.0 lb

## 2018-09-24 DIAGNOSIS — K8021 Calculus of gallbladder without cholecystitis with obstruction: Secondary | ICD-10-CM | POA: Diagnosis not present

## 2018-09-24 DIAGNOSIS — Z5111 Encounter for antineoplastic chemotherapy: Secondary | ICD-10-CM | POA: Insufficient documentation

## 2018-09-24 DIAGNOSIS — Z7689 Persons encountering health services in other specified circumstances: Secondary | ICD-10-CM | POA: Diagnosis not present

## 2018-09-24 DIAGNOSIS — K859 Acute pancreatitis without necrosis or infection, unspecified: Secondary | ICD-10-CM | POA: Insufficient documentation

## 2018-09-24 DIAGNOSIS — C170 Malignant neoplasm of duodenum: Secondary | ICD-10-CM

## 2018-09-24 DIAGNOSIS — D649 Anemia, unspecified: Secondary | ICD-10-CM | POA: Insufficient documentation

## 2018-09-24 DIAGNOSIS — Z79899 Other long term (current) drug therapy: Secondary | ICD-10-CM

## 2018-09-24 DIAGNOSIS — R918 Other nonspecific abnormal finding of lung field: Secondary | ICD-10-CM

## 2018-09-24 DIAGNOSIS — Z95828 Presence of other vascular implants and grafts: Secondary | ICD-10-CM

## 2018-09-24 LAB — CBC WITH DIFFERENTIAL (CANCER CENTER ONLY)
Abs Immature Granulocytes: 0.1 10*3/uL — ABNORMAL HIGH (ref 0.00–0.07)
Basophils Absolute: 0 10*3/uL (ref 0.0–0.1)
Basophils Relative: 1 %
Eosinophils Absolute: 0.1 10*3/uL (ref 0.0–0.5)
Eosinophils Relative: 1 %
HCT: 31.2 % — ABNORMAL LOW (ref 36.0–46.0)
Hemoglobin: 9.6 g/dL — ABNORMAL LOW (ref 12.0–15.0)
Immature Granulocytes: 2 %
Lymphocytes Relative: 28 %
Lymphs Abs: 1.7 10*3/uL (ref 0.7–4.0)
MCH: 27.7 pg (ref 26.0–34.0)
MCHC: 30.8 g/dL (ref 30.0–36.0)
MCV: 90.2 fL (ref 80.0–100.0)
Monocytes Absolute: 1 10*3/uL (ref 0.1–1.0)
Monocytes Relative: 17 %
Neutro Abs: 3 10*3/uL (ref 1.7–7.7)
Neutrophils Relative %: 51 %
Platelet Count: 141 10*3/uL — ABNORMAL LOW (ref 150–400)
RBC: 3.46 MIL/uL — ABNORMAL LOW (ref 3.87–5.11)
RDW: 22.6 % — ABNORMAL HIGH (ref 11.5–15.5)
WBC Count: 5.9 10*3/uL (ref 4.0–10.5)
nRBC: 0 % (ref 0.0–0.2)

## 2018-09-24 LAB — CMP (CANCER CENTER ONLY)
ALT: 209 U/L — ABNORMAL HIGH (ref 0–44)
AST: 153 U/L — ABNORMAL HIGH (ref 15–41)
Albumin: 3.5 g/dL (ref 3.5–5.0)
Alkaline Phosphatase: 159 U/L — ABNORMAL HIGH (ref 38–126)
Anion gap: 9 (ref 5–15)
BUN: 12 mg/dL (ref 6–20)
CO2: 25 mmol/L (ref 22–32)
Calcium: 8.1 mg/dL — ABNORMAL LOW (ref 8.9–10.3)
Chloride: 105 mmol/L (ref 98–111)
Creatinine: 0.77 mg/dL (ref 0.44–1.00)
GFR, Est AFR Am: >60 mL/min (ref >60)
GFR, Estimated: >60 mL/min (ref >60)
Glucose, Bld: 113 mg/dL — ABNORMAL HIGH (ref 70–99)
Potassium: 3.8 mmol/L (ref 3.5–5.1)
Sodium: 139 mmol/L (ref 135–145)
Total Bilirubin: 0.4 mg/dL (ref 0.3–1.2)
Total Protein: 6.5 g/dL (ref 6.5–8.1)

## 2018-09-24 MED ORDER — SODIUM CHLORIDE 0.9% FLUSH
10.0000 mL | Freq: Once | INTRAVENOUS | Status: AC
  Administered 2018-09-24: 10 mL
  Filled 2018-09-24: qty 10

## 2018-09-24 MED ORDER — LEUCOVORIN CALCIUM INJECTION 350 MG
400.0000 mg/m2 | Freq: Once | INTRAVENOUS | Status: AC
  Administered 2018-09-24: 15:00:00 872 mg via INTRAVENOUS
  Filled 2018-09-24: qty 43.6

## 2018-09-24 MED ORDER — SODIUM CHLORIDE 0.9 % IV SOLN
2400.0000 mg/m2 | INTRAVENOUS | Status: DC
  Administered 2018-09-24: 5250 mg via INTRAVENOUS
  Filled 2018-09-24: qty 105

## 2018-09-24 MED ORDER — PALONOSETRON HCL INJECTION 0.25 MG/5ML
0.2500 mg | Freq: Once | INTRAVENOUS | Status: AC
  Administered 2018-09-24: 0.25 mg via INTRAVENOUS

## 2018-09-24 MED ORDER — DEXTROSE 5 % IV SOLN
Freq: Once | INTRAVENOUS | Status: AC
  Administered 2018-09-24: 14:00:00 via INTRAVENOUS
  Filled 2018-09-24: qty 250

## 2018-09-24 MED ORDER — SODIUM CHLORIDE 0.9 % IV SOLN
Freq: Once | INTRAVENOUS | Status: AC
  Administered 2018-09-24: 14:00:00 via INTRAVENOUS
  Filled 2018-09-24: qty 5

## 2018-09-24 MED ORDER — OXALIPLATIN CHEMO INJECTION 100 MG/20ML: 50.0000 mg/m2 | Freq: Once | INTRAVENOUS | Status: DC

## 2018-09-24 MED ORDER — OXALIPLATIN CHEMO INJECTION 100 MG/20ML
47.0000 mg/m2 | Freq: Once | INTRAVENOUS | Status: AC
  Administered 2018-09-24: 15:00:00 100 mg via INTRAVENOUS
  Filled 2018-09-24: qty 20

## 2018-09-24 MED ORDER — FLUOROURACIL CHEMO INJECTION 2.5 GM/50ML
400.0000 mg/m2 | Freq: Once | INTRAVENOUS | Status: AC
  Administered 2018-09-24: 17:00:00 850 mg via INTRAVENOUS
  Filled 2018-09-24: qty 17

## 2018-09-24 MED ORDER — PALONOSETRON HCL INJECTION 0.25 MG/5ML
INTRAVENOUS | Status: AC
  Filled 2018-09-24: qty 5

## 2018-09-24 NOTE — Patient Instructions (Signed)
[facility] Discharge Instructions for Patients Receiving Chemotherapy  Today you received the following chemotherapy agents: Oxaliplatin, Leucovorin, and 5FU.  To help prevent nausea and vomiting after your treatment, we encourage you to take your nausea medication as directed.   If you develop nausea and vomiting that is not controlled by your nausea medication, call the clinic.   BELOW ARE SYMPTOMS THAT SHOULD BE REPORTED IMMEDIATELY:  *FEVER GREATER THAN 100.5 F  *CHILLS WITH OR WITHOUT FEVER  NAUSEA AND VOMITING THAT IS NOT CONTROLLED WITH YOUR NAUSEA MEDICATION  *UNUSUAL SHORTNESS OF BREATH  *UNUSUAL BRUISING OR BLEEDING  TENDERNESS IN MOUTH AND THROAT WITH OR WITHOUT PRESENCE OF ULCERS  *URINARY PROBLEMS  *BOWEL PROBLEMS  UNUSUAL RASH Items with * indicate a potential emergency and should be followed up as soon as possible.  Feel free to call the clinic should you have any questions or concerns. The clinic phone number is [phone].  Please show the CHEMO ALERT CARD at check-in to the Emergency Department and triage nurse.    

## 2018-09-24 NOTE — Progress Notes (Addendum)
[facility] OFFICE PROGRESS NOTE   Diagnosis: Small bowel carcinoma  INTERVAL HISTORY:   [name] returns as scheduled.  She completed cycle 5 FOLFOX 09/10/2018.  Oxaliplatin was dose reduced due to elevated liver enzymes.  She overall is feeling better.  She had mild nausea the weekend after chemotherapy.  No mouth sores.  No diarrhea.  She notes increased fatigue.  She has persistent cold sensitivity.  No numbness or tingling in the absence of cold exposure.  Pain is better.  Objective:  Vital signs in last 24 hours:  Blood pressure 127/75, pulse 97, temperature 97.8 F (36.6 C), temperature source Oral, resp. rate 18, height 5' 11" (1.803 m), weight 218 lb (98.9 kg), SpO2 98 %.    HEENT: No thrush or ulcers. Vascular: No leg edema. Neuro: Vibratory sense intact over the fingertips per tuning fork exam. Skin: Palms without erythema. Port-A-Cath without erythema.   Lab Results:  Lab Results  Component Value Date   WBC 5.9 09/24/2018   HGB 9.6 (L) 09/24/2018   HCT 31.2 (L) 09/24/2018   MCV 90.2 09/24/2018   PLT 141 (L) 09/24/2018   NEUTROABS 3.0 09/24/2018    Imaging:  No results found.  Medications: I have reviewed the patient's current medications.  Assessment/Plan: 1.Adenocarcinoma of the duodenum  CT abdomen/pelvis 06/16/2018-changes of pancreatitis, fullness at the head of the pancreas without a defined mass, multiple subcentimeter peripancreatic lymph nodes, distal common bile duct stent, multiple nodules at the lung bases  EUS 06/14/2018-ulcerated mucosa/mass at the distal duodenal bulb with extension to the pancreas head, portal adenopathy, biopsy of duodenal mass-adenocarcinoma;foundation 1-microsatellite stable, tumor mutational burden 1, KRASG12D mutation  ERCP 06/14/2018-distal duodenal bulb mass, 1 cm proximal to the ampulla causing biliary obstruction, status post placement of a metal common bile duct stent  CT chest  06/20/2018-bilateral pulmonary nodules consistent with metastatic disease, supraclavicular, mediastinal, and hilar lymphadenopathy, changes of pancreatitis  Ultrasound-guided biopsy of a left supraclavicular lymph node 06/22/2018-metastatic adenocarcinoma  Cycle 1 FOLFOX 07/03/2018  Cycle 2 FOLFOX 07/17/2018  Cycle 3 FOLFOX 08/06/2018, Neulasta added  Cycle 4 FOLFOX 08/21/2018  CTs 09/06/2018- numerous small lung nodules generally decreased in size; new rounded masslike subpleural opacity right upper lobe with some evidence of internal cavitation, measuring 4.0 cm.  Minimal fat stranding about the pancreas and duodenum which is decreased.  Discrete mass is not noted.  Previously seen common bile duct stent absent.  Gallbladder wall thickening with faintly calcified gallstones.  No biliary ductal dilatation.  Cycle 5 FOLFOX 09/10/2018 (oxaliplatin dose reduced due to elevated liver enzymes)   Cycle 6 FOLFOX 09/24/2018 (oxaliplatin further dose reduced due to elevated liver enzymes)   2.Biliary obstruction secondary to the duodenal mass, status post placement of a bile duct stent 06/14/2018 3.Post ERCP pancreatitis 06/16/2018 4.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 Abdominal pain- secondary to the small bowel carcinoma versus pancreatitis  9. Rash-improved 10. Neutropenia secondary to chemotherapy 07/31/2018 11.Elevated transaminases; improved 09/07/2018; improved 09/10/2018.  Question related to chemotherapy.   Disposition: [name] appears stable.  She has completed 5 cycles of FOLFOX.  She noted improved tolerance of the chemotherapy following cycle 5.  Plan to proceed with cycle 6 today as scheduled.  Plan for restaging CTs after cycle 8.  We reviewed the labs from today.  Counts are adequate for  treatment.  Liver  enzymes remain elevated, higher than 2 weeks ago.  We reviewed today's labs with the [facility] pharmacist.  Oxaliplatin will be further dose reduced.  She will return for lab, follow-up and cycle 7 FOLFOX in 2 weeks.  She will contact the office in the interim with any problems.  Patient seen with Dr. Benay Spice.    Ned Card ANP/GNP-BC   09/24/2018  12:32 PM  This was a shared visit with Ned Card.  Ms. Armbrister has an improved performance status.  She will complete cycle 6 FOLFOX today.  The liver enzymes are again elevated.  This appears to be related to toxicity from chemotherapy as opposed to biliary obstruction.  We discussed the liver enzyme elevation with the [facility] pharmacy.  The plan is to proceed with FOLFOX today.  I will further dose reduce the oxaliplatin.  She understands the potential for liver toxicity with further chemotherapy.  Julieanne Manson, MD

## 2018-09-24 NOTE — Telephone Encounter (Signed)
Scheduled appt per 5/11 los.  Added treatment in the book for approval (5/26).

## 2018-09-26 ENCOUNTER — Inpatient Hospital Stay: Payer: 59

## 2018-09-26 ENCOUNTER — Other Ambulatory Visit: Payer: Self-pay

## 2018-09-26 ENCOUNTER — Other Ambulatory Visit: Payer: Self-pay | Admitting: Gastroenterology

## 2018-09-26 VITALS — BP 105/68 | HR 89 | Temp 98.2°F | Resp 18

## 2018-09-26 DIAGNOSIS — C170 Malignant neoplasm of duodenum: Secondary | ICD-10-CM | POA: Diagnosis not present

## 2018-09-26 DIAGNOSIS — Z95828 Presence of other vascular implants and grafts: Secondary | ICD-10-CM

## 2018-09-26 MED ORDER — PEGFILGRASTIM INJECTION 6 MG/0.6ML ~~LOC~~
6.0000 mg | PREFILLED_SYRINGE | Freq: Once | SUBCUTANEOUS | Status: AC
  Administered 2018-09-26: 6 mg via SUBCUTANEOUS

## 2018-09-26 MED ORDER — SODIUM CHLORIDE 0.9% FLUSH
10.0000 mL | Freq: Once | INTRAVENOUS | Status: AC
  Administered 2018-09-26: 16:00:00 10 mL
  Filled 2018-09-26: qty 10

## 2018-09-26 MED ORDER — HEPARIN SOD (PORK) LOCK FLUSH 100 UNIT/ML IV SOLN
500.0000 [IU] | Freq: Once | INTRAVENOUS | Status: AC
  Administered 2018-09-26: 16:00:00 500 [IU]
  Filled 2018-09-26: qty 5

## 2018-09-26 MED ORDER — PEGFILGRASTIM INJECTION 6 MG/0.6ML ~~LOC~~
PREFILLED_SYRINGE | SUBCUTANEOUS | Status: AC
  Filled 2018-09-26: qty 0.6

## 2018-09-26 NOTE — Patient Instructions (Addendum)
Pegfilgrastim injection  What is this medicine?  PEGFILGRASTIM (PEG fil gra stim) is a long-acting granulocyte colony-stimulating factor that stimulates the growth of neutrophils, a type of white blood cell important in the body's fight against infection. It is used to reduce the incidence of fever and infection in patients with certain types of cancer who are receiving chemotherapy that affects the bone marrow, and to increase survival after being exposed to high doses of radiation.  This medicine may be used for other purposes; ask your health care provider or pharmacist if you have questions.  COMMON BRAND NAME(S): Fulphila, Neulasta, UDENYCA  What should I tell my health care provider before I take this medicine?  They need to know if you have any of these conditions:  -kidney disease  -latex allergy  -ongoing radiation therapy  -sickle cell disease  -skin reactions to acrylic adhesives (On-Body Injector only)  -an unusual or allergic reaction to pegfilgrastim, filgrastim, other medicines, foods, dyes, or preservatives  -pregnant or trying to get pregnant  -breast-feeding  How should I use this medicine?  This medicine is for injection under the skin. If you get this medicine at home, you will be taught how to prepare and give the pre-filled syringe or how to use the On-body Injector. Refer to the patient Instructions for Use for detailed instructions. Use exactly as directed. Tell your healthcare provider immediately if you suspect that the On-body Injector may not have performed as intended or if you suspect the use of the On-body Injector resulted in a missed or partial dose.  It is important that you put your used needles and syringes in a special sharps container. Do not put them in a trash can. If you do not have a sharps container, call your pharmacist or healthcare provider to get one.  Talk to your pediatrician regarding the use of this medicine in children. While this drug may be prescribed for  selected conditions, precautions do apply.  Overdosage: If you think you have taken too much of this medicine contact a poison control center or emergency room at once.  NOTE: This medicine is only for you. Do not share this medicine with others.  What if I miss a dose?  It is important not to miss your dose. Call your doctor or health care professional if you miss your dose. If you miss a dose due to an On-body Injector failure or leakage, a new dose should be administered as soon as possible using a single prefilled syringe for manual use.  What may interact with this medicine?  Interactions have not been studied.  Give your health care provider a list of all the medicines, herbs, non-prescription drugs, or dietary supplements you use. Also tell them if you smoke, drink alcohol, or use illegal drugs. Some items may interact with your medicine.  This list may not describe all possible interactions. Give your health care provider a list of all the medicines, herbs, non-prescription drugs, or dietary supplements you use. Also tell them if you smoke, drink alcohol, or use illegal drugs. Some items may interact with your medicine.  What should I watch for while using this medicine?  You may need blood work done while you are taking this medicine.  If you are going to need a MRI, CT scan, or other procedure, tell your doctor that you are using this medicine (On-Body Injector only).  What side effects may I notice from receiving this medicine?  Side effects that you should report to   your doctor or health care professional as soon as possible:  -allergic reactions like skin rash, itching or hives, swelling of the face, lips, or tongue  -back pain  -dizziness  -fever  -pain, redness, or irritation at site where injected  -pinpoint red spots on the skin  -red or dark-brown urine  -shortness of breath or breathing problems  -stomach or side pain, or pain at the shoulder  -swelling  -tiredness  -trouble passing urine or  change in the amount of urine  Side effects that usually do not require medical attention (report to your doctor or health care professional if they continue or are bothersome):  -bone pain  -muscle pain  This list may not describe all possible side effects. Call your doctor for medical advice about side effects. You may report side effects to FDA at 1-800-FDA-1088.  Where should I keep my medicine?  Keep out of the reach of children.  If you are using this medicine at home, you will be instructed on how to store it. Throw away any unused medicine after the expiration date on the label.  NOTE: This sheet is a summary. It may not cover all possible information. If you have questions about this medicine, talk to your doctor, pharmacist, or health care provider.   2019 Elsevier/Gold Standard (2017-08-07 16:57:08)

## 2018-09-28 ENCOUNTER — Telehealth: Payer: Self-pay | Admitting: *Deleted

## 2018-09-28 NOTE — Telephone Encounter (Addendum)
Reports waking up yesterday feeling cold and sweaty with nausea all day. Maximum temp was 99.3 all day. No other symptoms noted. No change in her pain noted at the time. Changed her patch this morning and all the symptoms resolved. Asking if she needs to increase her patch dose or what MD thinks happened. Per Dr. Benay Spice: ? Related to chemo, elevated liver enzymes, would not increase patch. Call for fever >101. Left this message on her voice mail.

## 2018-10-03 ENCOUNTER — Telehealth: Payer: Self-pay | Admitting: Licensed Clinical Social Worker

## 2018-10-03 NOTE — Telephone Encounter (Signed)
Called patient regarding upcoming [facility] appointment, left a voicemail and e-mail has been sent.  °

## 2018-10-04 ENCOUNTER — Other Ambulatory Visit: Payer: 59

## 2018-10-04 ENCOUNTER — Inpatient Hospital Stay: Payer: 59 | Admitting: Licensed Clinical Social Worker

## 2018-10-05 ENCOUNTER — Other Ambulatory Visit: Payer: Self-pay | Admitting: Nurse Practitioner

## 2018-10-05 ENCOUNTER — Other Ambulatory Visit: Payer: Self-pay | Admitting: *Deleted

## 2018-10-05 DIAGNOSIS — C170 Malignant neoplasm of duodenum: Secondary | ICD-10-CM

## 2018-10-05 DIAGNOSIS — K858 Other acute pancreatitis without necrosis or infection: Secondary | ICD-10-CM

## 2018-10-05 MED ORDER — PROMETHAZINE HCL 12.5 MG PO TABS: 12.5000 mg | ORAL_TABLET | Freq: Four times a day (QID) | ORAL | 1 refills | Status: DC | PRN

## 2018-10-05 MED ORDER — HYDROMORPHONE HCL 4 MG PO TABS: 4.0000 mg | ORAL_TABLET | ORAL | 0 refills | Status: DC | PRN

## 2018-10-05 MED ORDER — FENTANYL 75 MCG/HR TD PT72: 1.0000 | MEDICATED_PATCH | TRANSDERMAL | 0 refills | Status: DC

## 2018-10-05 NOTE — Progress Notes (Signed)
Requesting refills on Duragesic & Dilaudid--MD notified; and Promethazine

## 2018-10-08 ENCOUNTER — Other Ambulatory Visit: Payer: Self-pay | Admitting: Oncology

## 2018-10-09 ENCOUNTER — Telehealth: Payer: Self-pay | Admitting: Nurse Practitioner

## 2018-10-09 ENCOUNTER — Inpatient Hospital Stay (HOSPITAL_BASED_OUTPATIENT_CLINIC_OR_DEPARTMENT_OTHER): Payer: 59 | Admitting: Nurse Practitioner

## 2018-10-09 ENCOUNTER — Inpatient Hospital Stay: Payer: 59

## 2018-10-09 ENCOUNTER — Other Ambulatory Visit: Payer: Self-pay

## 2018-10-09 ENCOUNTER — Encounter: Payer: Self-pay | Admitting: Nurse Practitioner

## 2018-10-09 VITALS — BP 152/84 | HR 97 | Temp 98.3°F | Resp 17 | Ht 71.0 in | Wt 219.5 lb

## 2018-10-09 DIAGNOSIS — D649 Anemia, unspecified: Secondary | ICD-10-CM

## 2018-10-09 DIAGNOSIS — Z95828 Presence of other vascular implants and grafts: Secondary | ICD-10-CM

## 2018-10-09 DIAGNOSIS — K859 Acute pancreatitis without necrosis or infection, unspecified: Secondary | ICD-10-CM | POA: Diagnosis not present

## 2018-10-09 DIAGNOSIS — C170 Malignant neoplasm of duodenum: Secondary | ICD-10-CM

## 2018-10-09 DIAGNOSIS — Z79899 Other long term (current) drug therapy: Secondary | ICD-10-CM

## 2018-10-09 DIAGNOSIS — R918 Other nonspecific abnormal finding of lung field: Secondary | ICD-10-CM

## 2018-10-09 DIAGNOSIS — K8031 Calculus of bile duct with cholangitis, unspecified, with obstruction: Secondary | ICD-10-CM

## 2018-10-09 LAB — CBC WITH DIFFERENTIAL (CANCER CENTER ONLY)
Abs Immature Granulocytes: 0.09 10*3/uL — ABNORMAL HIGH (ref 0.00–0.07)
Basophils Absolute: 0 10*3/uL (ref 0.0–0.1)
Basophils Relative: 0 %
Eosinophils Absolute: 0.1 10*3/uL (ref 0.0–0.5)
Eosinophils Relative: 1 %
HCT: 31.7 % — ABNORMAL LOW (ref 36.0–46.0)
Hemoglobin: 9.8 g/dL — ABNORMAL LOW (ref 12.0–15.0)
Immature Granulocytes: 2 %
Lymphocytes Relative: 34 %
Lymphs Abs: 1.6 10*3/uL (ref 0.7–4.0)
MCH: 27.3 pg (ref 26.0–34.0)
MCHC: 30.9 g/dL (ref 30.0–36.0)
MCV: 88.3 fL (ref 80.0–100.0)
Monocytes Absolute: 0.8 10*3/uL (ref 0.1–1.0)
Monocytes Relative: 17 %
Neutro Abs: 2.2 10*3/uL (ref 1.7–7.7)
Neutrophils Relative %: 46 %
Platelet Count: 185 10*3/uL (ref 150–400)
RBC: 3.59 MIL/uL — ABNORMAL LOW (ref 3.87–5.11)
RDW: 23.5 % — ABNORMAL HIGH (ref 11.5–15.5)
WBC Count: 4.9 10*3/uL (ref 4.0–10.5)
nRBC: 0 % (ref 0.0–0.2)

## 2018-10-09 LAB — CMP (CANCER CENTER ONLY)
ALT: 112 U/L — ABNORMAL HIGH (ref 0–44)
AST: 103 U/L — ABNORMAL HIGH (ref 15–41)
Albumin: 3.8 g/dL (ref 3.5–5.0)
Alkaline Phosphatase: 155 U/L — ABNORMAL HIGH (ref 38–126)
Anion gap: 10 (ref 5–15)
BUN: 12 mg/dL (ref 6–20)
CO2: 26 mmol/L (ref 22–32)
Calcium: 9.3 mg/dL (ref 8.9–10.3)
Chloride: 104 mmol/L (ref 98–111)
Creatinine: 0.75 mg/dL (ref 0.44–1.00)
GFR, Est AFR Am: >60 mL/min (ref >60)
GFR, Estimated: >60 mL/min (ref >60)
Glucose, Bld: 116 mg/dL — ABNORMAL HIGH (ref 70–99)
Potassium: 3.8 mmol/L (ref 3.5–5.1)
Sodium: 140 mmol/L (ref 135–145)
Total Bilirubin: 0.4 mg/dL (ref 0.3–1.2)
Total Protein: 6.8 g/dL (ref 6.5–8.1)

## 2018-10-09 LAB — CEA (IN HOUSE-CHCC): CEA (CHCC-In House): 2.41 ng/mL (ref 0.00–5.00)

## 2018-10-09 MED ORDER — PALONOSETRON HCL INJECTION 0.25 MG/5ML
INTRAVENOUS | Status: AC
  Filled 2018-10-09: qty 5

## 2018-10-09 MED ORDER — SODIUM CHLORIDE 0.9% FLUSH
10.0000 mL | Freq: Once | INTRAVENOUS | Status: AC
  Administered 2018-10-09: 10 mL
  Filled 2018-10-09: qty 10

## 2018-10-09 MED ORDER — PALONOSETRON HCL INJECTION 0.25 MG/5ML
0.2500 mg | Freq: Once | INTRAVENOUS | Status: AC
  Administered 2018-10-09: 0.25 mg via INTRAVENOUS

## 2018-10-09 MED ORDER — SODIUM CHLORIDE 0.9 % IV SOLN
2400.0000 mg/m2 | INTRAVENOUS | Status: DC
  Administered 2018-10-09: 5250 mg via INTRAVENOUS
  Filled 2018-10-09: qty 105

## 2018-10-09 MED ORDER — FLUOROURACIL CHEMO INJECTION 2.5 GM/50ML
400.0000 mg/m2 | Freq: Once | INTRAVENOUS | Status: AC
  Administered 2018-10-09: 15:00:00 850 mg via INTRAVENOUS
  Filled 2018-10-09: qty 17

## 2018-10-09 MED ORDER — DEXTROSE 5 % IV SOLN
Freq: Once | INTRAVENOUS | Status: AC
  Administered 2018-10-09: 11:00:00 via INTRAVENOUS
  Filled 2018-10-09: qty 250

## 2018-10-09 MED ORDER — SODIUM CHLORIDE 0.9 % IV SOLN
Freq: Once | INTRAVENOUS | Status: AC
  Administered 2018-10-09: 12:00:00 via INTRAVENOUS
  Filled 2018-10-09: qty 5

## 2018-10-09 MED ORDER — LEUCOVORIN CALCIUM INJECTION 350 MG
400.0000 mg/m2 | Freq: Once | INTRAVENOUS | Status: AC
  Administered 2018-10-09: 872 mg via INTRAVENOUS
  Filled 2018-10-09: qty 43.6

## 2018-10-09 MED ORDER — ONDANSETRON HCL 8 MG PO TABS: ORAL_TABLET | ORAL | 1 refills | Status: DC

## 2018-10-09 MED ORDER — OXALIPLATIN CHEMO INJECTION 100 MG/20ML
50.0000 mg/m2 | Freq: Once | INTRAVENOUS | Status: AC
  Administered 2018-10-09: 110 mg via INTRAVENOUS
  Filled 2018-10-09: qty 10

## 2018-10-09 NOTE — Progress Notes (Signed)
Per Ned Card, NP, ok to treat with elevated LFT's

## 2018-10-09 NOTE — Telephone Encounter (Signed)
Scheduled appt per 5/26 los. °

## 2018-10-09 NOTE — Progress Notes (Signed)
[place] OFFICE PROGRESS NOTE   Diagnosis: Small bowel carcinoma  INTERVAL HISTORY:   Hailey Houston returns as scheduled.  She completed cycle 6 FOLFOX 09/24/2018.  She had nausea beginning day 3.  This lasted for a part of the day and then resolved.  She had a single headache last week.  She has periodic hot flashes.  No associated fever.  No mouth sores.  She had some loose stools on the same day she had nausea.  She has mild persistent cold sensitivity.  She occasionally notes numbness and tingling in the hands that is not cold related.  Objective:  Vital signs in last 24 hours:  Blood pressure (!) 152/84, pulse 97, temperature 98.3 F (36.8 C), temperature source Oral, resp. rate 17, height _0  (1.803 m), weight 219 lb 8 oz (99.6 kg), SpO2 100 %.    HEENT: No thrush or ulcers. Vascular: No leg edema.  Left lower leg is slightly larger than the right lower leg (chronic per patient due to previous fracture/hardware) Neuro: Vibratory sense intact over the fingertips per tuning fork exam. Skin: Palms without erythema. Port-A-Cath without erythema.   Lab Results:  Lab Results  Component Value Date   WBC 4.9 10/09/2018   HGB 9.8 (L) 10/09/2018   HCT 31.7 (L) 10/09/2018   MCV 88.3 10/09/2018   PLT 185 10/09/2018   NEUTROABS 2.2 10/09/2018    Imaging:  No results found.  Medications: I have reviewed the patient's current medications.  Assessment/Plan: 1.Adenocarcinoma of the duodenum  CT abdomen/pelvis 06/16/2018-changes of pancreatitis, fullness at the head of the pancreas without a defined mass, multiple subcentimeter peripancreatic lymph nodes, distal common bile duct stent, multiple nodules at the lung bases  EUS 06/14/2018-ulcerated mucosa/mass at the distal duodenal bulb with extension to the pancreas head, portal adenopathy, biopsy of duodenal mass-adenocarcinoma;foundation 1-microsatellite stable, tumor mutational burden 1, KRASG12D mutation   ERCP 06/14/2018-distal duodenal bulb mass, 1 cm proximal to the ampulla causing biliary obstruction, status post placement of a metal common bile duct stent  CT chest 06/20/2018-bilateral pulmonary nodules consistent with metastatic disease, supraclavicular, mediastinal, and hilar lymphadenopathy, changes of pancreatitis  Ultrasound-guided biopsy of a left supraclavicular lymph node 06/22/2018-metastatic adenocarcinoma  Cycle 1 FOLFOX 07/03/2018  Cycle 2 FOLFOX 07/17/2018  Cycle 3 FOLFOX 08/06/2018, Neulasta added  Cycle 4 FOLFOX 08/21/2018  CTs 09/06/2018- numerous small lung nodules generally decreased in size; new rounded masslike subpleural opacity right upper lobe with some evidence of internal cavitation,measuring 4.0 cm. Minimal fat stranding about the pancreas and duodenum which is decreased. Discrete mass is not noted. Previously seen common bile duct stent absent. Gallbladder wall thickening with faintly calcified gallstones. No biliary ductal dilatation.  Cycle 5 FOLFOX 09/10/2018(oxaliplatin dose reduced due to elevated liver enzymes)   Cycle 6 FOLFOX 09/24/2018 (oxaliplatin further dose reduced due to elevated liver enzymes)  Cycle 7 FOLFOX 10/09/2018   2.Biliary obstruction secondary to the duodenal mass, status post placement of a bile duct stent 06/14/2018 3.Post ERCP pancreatitis 06/16/2018 4.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 Abdominal pain- secondary to the small bowel carcinoma versus pancreatitis  9. Rash-improved 10. Neutropenia secondary to chemotherapy 07/31/2018 11.Elevated transaminases;improved 09/07/2018; improved 09/10/2018. Question related to chemotherapy.   Disposition: Hailey Houston appears stable.  She has completed 6 cycles of FOLFOX.  Her performance  status continues to be improved.  Plan to proceed with cycle 7 today as scheduled.  We reviewed the labs from today.  Counts are adequate for treatment.  She continues to have liver enzyme elevation, improved as compared to 2 weeks ago.  She will return for lab, follow-up and cycle 8 FOLFOX in 2 weeks.  The plan is for restaging CTs after cycle 8.  Plan reviewed with Dr. Benay Spice.    Ned Card ANP/GNP-BC   10/09/2018  10:04 AM

## 2018-10-09 NOTE — Patient Instructions (Signed)
[facility] Discharge Instructions for Patients Receiving Chemotherapy  Today you received the following chemotherapy agents: Oxaliplatin, Leucovorin, and 5FU.  To help prevent nausea and vomiting after your treatment, we encourage you to take your nausea medication as directed.   If you develop nausea and vomiting that is not controlled by your nausea medication, call the clinic.   BELOW ARE SYMPTOMS THAT SHOULD BE REPORTED IMMEDIATELY:  *FEVER GREATER THAN 100.5 F  *CHILLS WITH OR WITHOUT FEVER  NAUSEA AND VOMITING THAT IS NOT CONTROLLED WITH YOUR NAUSEA MEDICATION  *UNUSUAL SHORTNESS OF BREATH  *UNUSUAL BRUISING OR BLEEDING  TENDERNESS IN MOUTH AND THROAT WITH OR WITHOUT PRESENCE OF ULCERS  *URINARY PROBLEMS  *BOWEL PROBLEMS  UNUSUAL RASH Items with * indicate a potential emergency and should be followed up as soon as possible.  Feel free to call the clinic should you have any questions or concerns. The clinic phone number is [phone].  Please show the CHEMO ALERT CARD at check-in to the Emergency Department and triage nurse.    

## 2018-10-11 ENCOUNTER — Other Ambulatory Visit: Payer: Self-pay

## 2018-10-11 ENCOUNTER — Inpatient Hospital Stay: Payer: 59

## 2018-10-11 VITALS — BP 154/82 | HR 81 | Temp 99.6°F | Resp 18

## 2018-10-11 DIAGNOSIS — C170 Malignant neoplasm of duodenum: Secondary | ICD-10-CM | POA: Diagnosis not present

## 2018-10-11 MED ORDER — HEPARIN SOD (PORK) LOCK FLUSH 100 UNIT/ML IV SOLN
500.0000 [IU] | Freq: Once | INTRAVENOUS | Status: AC | PRN
  Administered 2018-10-11: 500 [IU]
  Filled 2018-10-11: qty 5

## 2018-10-11 MED ORDER — PEGFILGRASTIM INJECTION 6 MG/0.6ML ~~LOC~~
6.0000 mg | PREFILLED_SYRINGE | Freq: Once | SUBCUTANEOUS | Status: AC
  Administered 2018-10-11: 6 mg via SUBCUTANEOUS

## 2018-10-11 MED ORDER — PEGFILGRASTIM INJECTION 6 MG/0.6ML ~~LOC~~
PREFILLED_SYRINGE | SUBCUTANEOUS | Status: AC
  Filled 2018-10-11: qty 0.6

## 2018-10-11 MED ORDER — SODIUM CHLORIDE 0.9% FLUSH
10.0000 mL | INTRAVENOUS | Status: DC | PRN
  Administered 2018-10-11: 10 mL
  Filled 2018-10-11: qty 10

## 2018-10-11 NOTE — Patient Instructions (Signed)
Pegfilgrastim injection  What is this medicine?  PEGFILGRASTIM (PEG fil gra stim) is a long-acting granulocyte colony-stimulating factor that stimulates the growth of neutrophils, a type of white blood cell important in the body's fight against infection. It is used to reduce the incidence of fever and infection in patients with certain types of cancer who are receiving chemotherapy that affects the bone marrow, and to increase survival after being exposed to high doses of radiation.  This medicine may be used for other purposes; ask your health care provider or pharmacist if you have questions.  COMMON BRAND NAME(S): Fulphila, Neulasta, UDENYCA  What should I tell my health care provider before I take this medicine?  They need to know if you have any of these conditions:  -kidney disease  -latex allergy  -ongoing radiation therapy  -sickle cell disease  -skin reactions to acrylic adhesives (On-Body Injector only)  -an unusual or allergic reaction to pegfilgrastim, filgrastim, other medicines, foods, dyes, or preservatives  -pregnant or trying to get pregnant  -breast-feeding  How should I use this medicine?  This medicine is for injection under the skin. If you get this medicine at home, you will be taught how to prepare and give the pre-filled syringe or how to use the On-body Injector. Refer to the patient Instructions for Use for detailed instructions. Use exactly as directed. Tell your healthcare provider immediately if you suspect that the On-body Injector may not have performed as intended or if you suspect the use of the On-body Injector resulted in a missed or partial dose.  It is important that you put your used needles and syringes in a special sharps container. Do not put them in a trash can. If you do not have a sharps container, call your pharmacist or healthcare provider to get one.  Talk to your pediatrician regarding the use of this medicine in children. While this drug may be prescribed for  selected conditions, precautions do apply.  Overdosage: If you think you have taken too much of this medicine contact a poison control center or emergency room at once.  NOTE: This medicine is only for you. Do not share this medicine with others.  What if I miss a dose?  It is important not to miss your dose. Call your doctor or health care professional if you miss your dose. If you miss a dose due to an On-body Injector failure or leakage, a new dose should be administered as soon as possible using a single prefilled syringe for manual use.  What may interact with this medicine?  Interactions have not been studied.  Give your health care provider a list of all the medicines, herbs, non-prescription drugs, or dietary supplements you use. Also tell them if you smoke, drink alcohol, or use illegal drugs. Some items may interact with your medicine.  This list may not describe all possible interactions. Give your health care provider a list of all the medicines, herbs, non-prescription drugs, or dietary supplements you use. Also tell them if you smoke, drink alcohol, or use illegal drugs. Some items may interact with your medicine.  What should I watch for while using this medicine?  You may need blood work done while you are taking this medicine.  If you are going to need a MRI, CT scan, or other procedure, tell your doctor that you are using this medicine (On-Body Injector only).  What side effects may I notice from receiving this medicine?  Side effects that you should report to   your doctor or health care professional as soon as possible:  -allergic reactions like skin rash, itching or hives, swelling of the face, lips, or tongue  -back pain  -dizziness  -fever  -pain, redness, or irritation at site where injected  -pinpoint red spots on the skin  -red or dark-brown urine  -shortness of breath or breathing problems  -stomach or side pain, or pain at the shoulder  -swelling  -tiredness  -trouble passing urine or  change in the amount of urine  Side effects that usually do not require medical attention (report to your doctor or health care professional if they continue or are bothersome):  -bone pain  -muscle pain  This list may not describe all possible side effects. Call your doctor for medical advice about side effects. You may report side effects to FDA at 1-800-FDA-1088.  Where should I keep my medicine?  Keep out of the reach of children.  If you are using this medicine at home, you will be instructed on how to store it. Throw away any unused medicine after the expiration date on the label.  NOTE: This sheet is a summary. It may not cover all possible information. If you have questions about this medicine, talk to your doctor, pharmacist, or health care provider.   2019 [employer] Standard (2017-08-07 16:57:08)

## 2018-10-12 ENCOUNTER — Encounter: Payer: Self-pay | Admitting: Licensed Clinical Social Worker

## 2018-10-21 ENCOUNTER — Other Ambulatory Visit: Payer: Self-pay | Admitting: Oncology

## 2018-10-22 ENCOUNTER — Inpatient Hospital Stay: Payer: 59

## 2018-10-22 ENCOUNTER — Telehealth: Payer: Self-pay | Admitting: Nurse Practitioner

## 2018-10-22 ENCOUNTER — Inpatient Hospital Stay: Payer: 59 | Attending: Oncology

## 2018-10-22 ENCOUNTER — Inpatient Hospital Stay (HOSPITAL_BASED_OUTPATIENT_CLINIC_OR_DEPARTMENT_OTHER): Payer: 59 | Admitting: Nurse Practitioner

## 2018-10-22 ENCOUNTER — Other Ambulatory Visit: Payer: Self-pay

## 2018-10-22 ENCOUNTER — Encounter: Payer: Self-pay | Admitting: Nurse Practitioner

## 2018-10-22 VITALS — BP 126/76 | HR 99 | Temp 98.3°F | Resp 18 | Ht 71.0 in | Wt 220.6 lb

## 2018-10-22 DIAGNOSIS — C170 Malignant neoplasm of duodenum: Secondary | ICD-10-CM

## 2018-10-22 DIAGNOSIS — G620 Drug-induced polyneuropathy: Secondary | ICD-10-CM | POA: Diagnosis not present

## 2018-10-22 DIAGNOSIS — D701 Agranulocytosis secondary to cancer chemotherapy: Secondary | ICD-10-CM | POA: Insufficient documentation

## 2018-10-22 DIAGNOSIS — D649 Anemia, unspecified: Secondary | ICD-10-CM | POA: Insufficient documentation

## 2018-10-22 DIAGNOSIS — C7800 Secondary malignant neoplasm of unspecified lung: Secondary | ICD-10-CM | POA: Diagnosis not present

## 2018-10-22 DIAGNOSIS — Z5111 Encounter for antineoplastic chemotherapy: Secondary | ICD-10-CM | POA: Diagnosis not present

## 2018-10-22 DIAGNOSIS — Z95828 Presence of other vascular implants and grafts: Secondary | ICD-10-CM

## 2018-10-22 DIAGNOSIS — T451X5A Adverse effect of antineoplastic and immunosuppressive drugs, initial encounter: Secondary | ICD-10-CM | POA: Diagnosis not present

## 2018-10-22 DIAGNOSIS — K858 Other acute pancreatitis without necrosis or infection: Secondary | ICD-10-CM

## 2018-10-22 DIAGNOSIS — Z5189 Encounter for other specified aftercare: Secondary | ICD-10-CM | POA: Insufficient documentation

## 2018-10-22 LAB — CMP (CANCER CENTER ONLY)
ALT: 81 U/L — ABNORMAL HIGH (ref 0–44)
AST: 66 U/L — ABNORMAL HIGH (ref 15–41)
Albumin: 3.8 g/dL (ref 3.5–5.0)
Alkaline Phosphatase: 156 U/L — ABNORMAL HIGH (ref 38–126)
Anion gap: 10 (ref 5–15)
BUN: 11 mg/dL (ref 6–20)
CO2: 25 mmol/L (ref 22–32)
Calcium: 9 mg/dL (ref 8.9–10.3)
Chloride: 108 mmol/L (ref 98–111)
Creatinine: 0.83 mg/dL (ref 0.44–1.00)
GFR, Est AFR Am: >60 mL/min (ref >60)
GFR, Estimated: >60 mL/min (ref >60)
Glucose, Bld: 98 mg/dL (ref 70–99)
Potassium: 4 mmol/L (ref 3.5–5.1)
Sodium: 143 mmol/L (ref 135–145)
Total Bilirubin: 0.4 mg/dL (ref 0.3–1.2)
Total Protein: 6.6 g/dL (ref 6.5–8.1)

## 2018-10-22 LAB — CBC WITH DIFFERENTIAL (CANCER CENTER ONLY)
Abs Immature Granulocytes: 0.06 10*3/uL (ref 0.00–0.07)
Basophils Absolute: 0 10*3/uL (ref 0.0–0.1)
Basophils Relative: 0 %
Eosinophils Absolute: 0 10*3/uL (ref 0.0–0.5)
Eosinophils Relative: 1 %
HCT: 32.8 % — ABNORMAL LOW (ref 36.0–46.0)
Hemoglobin: 10 g/dL — ABNORMAL LOW (ref 12.0–15.0)
Immature Granulocytes: 1 %
Lymphocytes Relative: 26 %
Lymphs Abs: 1.4 10*3/uL (ref 0.7–4.0)
MCH: 27.2 pg (ref 26.0–34.0)
MCHC: 30.5 g/dL (ref 30.0–36.0)
MCV: 89.1 fL (ref 80.0–100.0)
Monocytes Absolute: 0.7 10*3/uL (ref 0.1–1.0)
Monocytes Relative: 14 %
Neutro Abs: 3 10*3/uL (ref 1.7–7.7)
Neutrophils Relative %: 58 %
Platelet Count: 171 10*3/uL (ref 150–400)
RBC: 3.68 MIL/uL — ABNORMAL LOW (ref 3.87–5.11)
RDW: 23.7 % — ABNORMAL HIGH (ref 11.5–15.5)
WBC Count: 5.3 10*3/uL (ref 4.0–10.5)
nRBC: 0 % (ref 0.0–0.2)

## 2018-10-22 MED ORDER — DEXTROSE 5 % IV SOLN
Freq: Once | INTRAVENOUS | Status: AC
  Administered 2018-10-22: 11:00:00 via INTRAVENOUS
  Filled 2018-10-22: qty 250

## 2018-10-22 MED ORDER — SODIUM CHLORIDE 0.9 % IV SOLN
2400.0000 mg/m2 | INTRAVENOUS | Status: DC
  Administered 2018-10-22: 5250 mg via INTRAVENOUS
  Filled 2018-10-22: qty 105

## 2018-10-22 MED ORDER — SODIUM CHLORIDE 0.9% FLUSH
10.0000 mL | Freq: Once | INTRAVENOUS | Status: AC
  Administered 2018-10-22: 10 mL
  Filled 2018-10-22: qty 10

## 2018-10-22 MED ORDER — SODIUM CHLORIDE 0.9 % IV SOLN
Freq: Once | INTRAVENOUS | Status: AC
  Administered 2018-10-22: 11:00:00 via INTRAVENOUS
  Filled 2018-10-22: qty 5

## 2018-10-22 MED ORDER — SODIUM CHLORIDE 0.9% FLUSH
10.0000 mL | INTRAVENOUS | Status: DC | PRN
  Filled 2018-10-22: qty 10

## 2018-10-22 MED ORDER — ONDANSETRON HCL 8 MG PO TABS: ORAL_TABLET | ORAL | 1 refills | Status: DC

## 2018-10-22 MED ORDER — LEUCOVORIN CALCIUM INJECTION 350 MG
400.0000 mg/m2 | Freq: Once | INTRAVENOUS | Status: AC
  Administered 2018-10-22: 872 mg via INTRAVENOUS
  Filled 2018-10-22: qty 43.6

## 2018-10-22 MED ORDER — PALONOSETRON HCL INJECTION 0.25 MG/5ML
0.2500 mg | Freq: Once | INTRAVENOUS | Status: AC
  Administered 2018-10-22: 11:00:00 0.25 mg via INTRAVENOUS

## 2018-10-22 MED ORDER — PROMETHAZINE HCL 12.5 MG PO TABS: 12.5000 mg | ORAL_TABLET | Freq: Four times a day (QID) | ORAL | 1 refills | Status: DC | PRN

## 2018-10-22 MED ORDER — OXALIPLATIN CHEMO INJECTION 100 MG/20ML
50.0000 mg/m2 | Freq: Once | INTRAVENOUS | Status: AC
  Administered 2018-10-22: 110 mg via INTRAVENOUS
  Filled 2018-10-22: qty 20

## 2018-10-22 MED ORDER — HEPARIN SOD (PORK) LOCK FLUSH 100 UNIT/ML IV SOLN
500.0000 [IU] | Freq: Once | INTRAVENOUS | Status: DC | PRN
  Filled 2018-10-22: qty 5

## 2018-10-22 MED ORDER — FENTANYL 75 MCG/HR TD PT72: 1.0000 | MEDICATED_PATCH | TRANSDERMAL | 0 refills | Status: DC

## 2018-10-22 MED ORDER — PALONOSETRON HCL INJECTION 0.25 MG/5ML
INTRAVENOUS | Status: AC
  Filled 2018-10-22: qty 5

## 2018-10-22 MED ORDER — HYDROMORPHONE HCL 4 MG PO TABS: 4.0000 mg | ORAL_TABLET | ORAL | 0 refills | Status: DC | PRN

## 2018-10-22 MED ORDER — FLUOROURACIL CHEMO INJECTION 2.5 GM/50ML
400.0000 mg/m2 | Freq: Once | INTRAVENOUS | Status: AC
  Administered 2018-10-22: 850 mg via INTRAVENOUS
  Filled 2018-10-22: qty 17

## 2018-10-22 NOTE — Patient Instructions (Signed)
[facility] Discharge Instructions for Patients Receiving Chemotherapy  Today you received the following chemotherapy agents: Oxaliplatin, Leucovorin, and 5FU.  To help prevent nausea and vomiting after your treatment, we encourage you to take your nausea medication as directed.   If you develop nausea and vomiting that is not controlled by your nausea medication, call the clinic.   BELOW ARE SYMPTOMS THAT SHOULD BE REPORTED IMMEDIATELY:  *FEVER GREATER THAN 100.5 F  *CHILLS WITH OR WITHOUT FEVER  NAUSEA AND VOMITING THAT IS NOT CONTROLLED WITH YOUR NAUSEA MEDICATION  *UNUSUAL SHORTNESS OF BREATH  *UNUSUAL BRUISING OR BLEEDING  TENDERNESS IN MOUTH AND THROAT WITH OR WITHOUT PRESENCE OF ULCERS  *URINARY PROBLEMS  *BOWEL PROBLEMS  UNUSUAL RASH Items with * indicate a potential emergency and should be followed up as soon as possible.  Feel free to call the clinic should you have any questions or concerns. The clinic phone number is [phone].  Please show the CHEMO ALERT CARD at check-in to the Emergency Department and triage nurse.    

## 2018-10-22 NOTE — Progress Notes (Signed)
[place] OFFICE PROGRESS NOTE   Diagnosis: Small bowel carcinoma  INTERVAL HISTORY:   Hailey Houston returns as scheduled.  She completed cycle 7 FOLFOX 10/09/2018.  She denies significant nausea.  No mouth sores.  No diarrhea following chemotherapy.  She had a loose stool this morning.  She has mild persistent cold sensitivity evolving the hands and mouth.  She notes consistent numbness in the toes.  Neuropathy symptoms do not interfere with activity.  She describes her pain as "okay".  She estimates taking Dilaudid 2 or 3 times a day.  She continues the Duragesic patch.  Objective:  Vital signs in last 24 hours:  Blood pressure 126/76, pulse 99, temperature 98.3 F (36.8 C), temperature source Oral, resp. rate 18, height 5' 11"  (1.803 m), weight 220 lb 9.6 oz (100.1 kg), SpO2 96 %.    Vascular: No leg edema.  Lower leg is slightly larger than the right lower leg. Neuro: Vibratory sense mildly decreased over the fingertips per tuning fork exam. Skin: Palms without erythema. Port-A-Cath without erythema.   Lab Results:  Lab Results  Component Value Date   WBC 5.3 10/22/2018   HGB 10.0 (L) 10/22/2018   HCT 32.8 (L) 10/22/2018   MCV 89.1 10/22/2018   PLT 171 10/22/2018   NEUTROABS 3.0 10/22/2018    Imaging:  No results found.  Medications: I have reviewed the patient's current medications.  Assessment/Plan: 1.Adenocarcinoma of the duodenum  CT abdomen/pelvis 06/16/2018-changes of pancreatitis, fullness at the head of the pancreas without a defined mass, multiple subcentimeter peripancreatic lymph nodes, distal common bile duct stent, multiple nodules at the lung bases  EUS 06/14/2018-ulcerated mucosa/mass at the distal duodenal bulb with extension to the pancreas head, portal adenopathy, biopsy of duodenal mass-adenocarcinoma;foundation 1-microsatellite stable, tumor mutational burden 1, KRASG12D mutation  ERCP 06/14/2018-distal duodenal bulb mass, 1 cm  proximal to the ampulla causing biliary obstruction, status post placement of a metal common bile duct stent  CT chest 06/20/2018-bilateral pulmonary nodules consistent with metastatic disease, supraclavicular, mediastinal, and hilar lymphadenopathy, changes of pancreatitis  Ultrasound-guided biopsy of a left supraclavicular lymph node 06/22/2018-metastatic adenocarcinoma  Cycle 1 FOLFOX 07/03/2018  Cycle 2 FOLFOX 07/17/2018  Cycle 3 FOLFOX 08/06/2018, Neulasta added  Cycle 4 FOLFOX 08/21/2018  CTs 09/06/2018- numerous small lung nodules generally decreased in size; new rounded masslike subpleural opacity right upper lobe with some evidence of internal cavitation,measuring 4.0 cm. Minimal fat stranding about the pancreas and duodenum which is decreased. Discrete mass is not noted. Previously seen common bile duct stent absent. Gallbladder wall thickening with faintly calcified gallstones. No biliary ductal dilatation.  Cycle 5 FOLFOX 09/10/2018(oxaliplatin dose reduceddue to elevated liver enzymes)  Cycle 6 FOLFOX 09/24/2018(oxaliplatin further dose reduced due to elevated liver enzymes)  Cycle 7 FOLFOX 10/09/2018  Cycle 8 FOLFOX 10/22/2018   2.Biliary obstruction secondary to the duodenal mass, status post placement of a bile duct stent 06/14/2018 3.Post ERCP pancreatitis 06/16/2018 4.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 Abdominal pain- secondary to the small bowel carcinoma versus pancreatitis  9. Rash-improved 10. Neutropenia secondary to chemotherapy 07/31/2018 11.Elevated transaminases;improved 09/07/2018; improved 09/10/2018. Question related to chemotherapy.   Disposition: Hailey Houston appears stable.  She has completed 7 cycles of FOLFOX.  Plan to proceed with cycle 8 today as  scheduled.  She will undergo restaging CTs prior to her next visit.  We reviewed the CBC and chemistry panel from today, adequate for treatment.  Liver enzymes are further improved.  She would like to schedule the next appointment at a 3-week interval rather than 2 due to a vacation.  She will undergo restaging CTs 11/12/2018.  We will see her in follow-up on 11/13/2018.  She will contact the office in the interim with any problems.  Plan reviewed with Dr. Benay Spice.    Ned Card ANP/GNP-BC   10/22/2018  9:53 AM

## 2018-10-22 NOTE — Telephone Encounter (Signed)
Scheduled appt per 6/8 los. °

## 2018-10-24 ENCOUNTER — Inpatient Hospital Stay: Payer: 59

## 2018-10-24 ENCOUNTER — Other Ambulatory Visit: Payer: Self-pay

## 2018-10-24 VITALS — BP 148/95 | HR 86 | Temp 99.1°F | Resp 18

## 2018-10-24 DIAGNOSIS — Z5111 Encounter for antineoplastic chemotherapy: Secondary | ICD-10-CM | POA: Diagnosis not present

## 2018-10-24 DIAGNOSIS — C170 Malignant neoplasm of duodenum: Secondary | ICD-10-CM

## 2018-10-24 MED ORDER — SODIUM CHLORIDE 0.9% FLUSH
10.0000 mL | INTRAVENOUS | Status: DC | PRN
  Administered 2018-10-24: 10 mL
  Filled 2018-10-24: qty 10

## 2018-10-24 MED ORDER — HEPARIN SOD (PORK) LOCK FLUSH 100 UNIT/ML IV SOLN
500.0000 [IU] | Freq: Once | INTRAVENOUS | Status: AC | PRN
  Administered 2018-10-24: 500 [IU]
  Filled 2018-10-24: qty 5

## 2018-10-24 MED ORDER — PEGFILGRASTIM INJECTION 6 MG/0.6ML ~~LOC~~
6.0000 mg | PREFILLED_SYRINGE | Freq: Once | SUBCUTANEOUS | Status: AC
  Administered 2018-10-24: 6 mg via SUBCUTANEOUS

## 2018-10-24 MED ORDER — PEGFILGRASTIM INJECTION 6 MG/0.6ML ~~LOC~~
PREFILLED_SYRINGE | SUBCUTANEOUS | Status: AC
  Filled 2018-10-24: qty 0.6

## 2018-10-24 NOTE — Patient Instructions (Signed)
Pegfilgrastim injection  What is this medicine?  PEGFILGRASTIM (PEG fil gra stim) is a long-acting granulocyte colony-stimulating factor that stimulates the growth of neutrophils, a type of white blood cell important in the body's fight against infection. It is used to reduce the incidence of fever and infection in patients with certain types of cancer who are receiving chemotherapy that affects the bone marrow, and to increase survival after being exposed to high doses of radiation.  This medicine may be used for other purposes; ask your health care provider or pharmacist if you have questions.  COMMON BRAND NAME(S): Fulphila, Neulasta, UDENYCA  What should I tell my health care provider before I take this medicine?  They need to know if you have any of these conditions:  -kidney disease  -latex allergy  -ongoing radiation therapy  -sickle cell disease  -skin reactions to acrylic adhesives (On-Body Injector only)  -an unusual or allergic reaction to pegfilgrastim, filgrastim, other medicines, foods, dyes, or preservatives  -pregnant or trying to get pregnant  -breast-feeding  How should I use this medicine?  This medicine is for injection under the skin. If you get this medicine at home, you will be taught how to prepare and give the pre-filled syringe or how to use the On-body Injector. Refer to the patient Instructions for Use for detailed instructions. Use exactly as directed. Tell your healthcare provider immediately if you suspect that the On-body Injector may not have performed as intended or if you suspect the use of the On-body Injector resulted in a missed or partial dose.  It is important that you put your used needles and syringes in a special sharps container. Do not put them in a trash can. If you do not have a sharps container, call your pharmacist or healthcare provider to get one.  Talk to your pediatrician regarding the use of this medicine in children. While this drug may be prescribed for  selected conditions, precautions do apply.  Overdosage: If you think you have taken too much of this medicine contact a poison control center or emergency room at once.  NOTE: This medicine is only for you. Do not share this medicine with others.  What if I miss a dose?  It is important not to miss your dose. Call your doctor or health care professional if you miss your dose. If you miss a dose due to an On-body Injector failure or leakage, a new dose should be administered as soon as possible using a single prefilled syringe for manual use.  What may interact with this medicine?  Interactions have not been studied.  Give your health care provider a list of all the medicines, herbs, non-prescription drugs, or dietary supplements you use. Also tell them if you smoke, drink alcohol, or use illegal drugs. Some items may interact with your medicine.  This list may not describe all possible interactions. Give your health care provider a list of all the medicines, herbs, non-prescription drugs, or dietary supplements you use. Also tell them if you smoke, drink alcohol, or use illegal drugs. Some items may interact with your medicine.  What should I watch for while using this medicine?  You may need blood work done while you are taking this medicine.  If you are going to need a MRI, CT scan, or other procedure, tell your doctor that you are using this medicine (On-Body Injector only).  What side effects may I notice from receiving this medicine?  Side effects that you should report to   your doctor or health care professional as soon as possible:  -allergic reactions like skin rash, itching or hives, swelling of the face, lips, or tongue  -back pain  -dizziness  -fever  -pain, redness, or irritation at site where injected  -pinpoint red spots on the skin  -red or dark-brown urine  -shortness of breath or breathing problems  -stomach or side pain, or pain at the shoulder  -swelling  -tiredness  -trouble passing urine or  change in the amount of urine  Side effects that usually do not require medical attention (report to your doctor or health care professional if they continue or are bothersome):  -bone pain  -muscle pain  This list may not describe all possible side effects. Call your doctor for medical advice about side effects. You may report side effects to FDA at 1-800-FDA-1088.  Where should I keep my medicine?  Keep out of the reach of children.  If you are using this medicine at home, you will be instructed on how to store it. Throw away any unused medicine after the expiration date on the label.  NOTE: This sheet is a summary. It may not cover all possible information. If you have questions about this medicine, talk to your doctor, pharmacist, or health care provider.   2019 [employer] Standard (2017-08-07 16:57:08)

## 2018-10-31 ENCOUNTER — Other Ambulatory Visit: Payer: Self-pay | Admitting: *Deleted

## 2018-10-31 MED ORDER — LORAZEPAM 0.5 MG PO TABS: ORAL_TABLET | ORAL | 0 refills | Status: DC

## 2018-10-31 NOTE — Progress Notes (Signed)
Patient called to request refill on ativan.

## 2018-11-05 ENCOUNTER — Ambulatory Visit: Payer: 59

## 2018-11-05 ENCOUNTER — Other Ambulatory Visit: Payer: 59

## 2018-11-05 ENCOUNTER — Ambulatory Visit: Payer: 59 | Admitting: Oncology

## 2018-11-10 ENCOUNTER — Other Ambulatory Visit: Payer: Self-pay | Admitting: Oncology

## 2018-11-12 ENCOUNTER — Ambulatory Visit (HOSPITAL_COMMUNITY)
Admission: RE | Admit: 2018-11-12 | Discharge: 2018-11-12 | Disposition: A | Discharge status: Home / Self Care | Payer: 59 | Source: Ambulatory Visit | Attending: Nurse Practitioner | Admitting: Nurse Practitioner

## 2018-11-12 ENCOUNTER — Other Ambulatory Visit: Payer: Self-pay

## 2018-11-12 DIAGNOSIS — C170 Malignant neoplasm of duodenum: Secondary | ICD-10-CM | POA: Insufficient documentation

## 2018-11-12 MED ORDER — IOHEXOL 300 MG/ML  SOLN
100.0000 mL | Freq: Once | INTRAMUSCULAR | Status: AC | PRN
  Administered 2018-11-12: 100 mL via INTRAVENOUS

## 2018-11-12 MED ORDER — SODIUM CHLORIDE (PF) 0.9 % IJ SOLN
INTRAMUSCULAR | Status: AC
  Filled 2018-11-12: qty 50

## 2018-11-13 ENCOUNTER — Other Ambulatory Visit: Payer: Self-pay

## 2018-11-13 ENCOUNTER — Inpatient Hospital Stay: Payer: 59

## 2018-11-13 ENCOUNTER — Inpatient Hospital Stay (HOSPITAL_BASED_OUTPATIENT_CLINIC_OR_DEPARTMENT_OTHER): Payer: 59 | Admitting: Nurse Practitioner

## 2018-11-13 ENCOUNTER — Encounter: Payer: Self-pay | Admitting: Nurse Practitioner

## 2018-11-13 VITALS — BP 142/92 | HR 81 | Temp 98.0°F | Resp 17 | Ht 71.0 in | Wt 227.2 lb

## 2018-11-13 DIAGNOSIS — C170 Malignant neoplasm of duodenum: Secondary | ICD-10-CM

## 2018-11-13 DIAGNOSIS — D649 Anemia, unspecified: Secondary | ICD-10-CM

## 2018-11-13 DIAGNOSIS — G620 Drug-induced polyneuropathy: Secondary | ICD-10-CM

## 2018-11-13 DIAGNOSIS — C7800 Secondary malignant neoplasm of unspecified lung: Secondary | ICD-10-CM

## 2018-11-13 DIAGNOSIS — D701 Agranulocytosis secondary to cancer chemotherapy: Secondary | ICD-10-CM

## 2018-11-13 DIAGNOSIS — Z5111 Encounter for antineoplastic chemotherapy: Secondary | ICD-10-CM | POA: Diagnosis not present

## 2018-11-13 DIAGNOSIS — Z95828 Presence of other vascular implants and grafts: Secondary | ICD-10-CM

## 2018-11-13 LAB — CBC WITH DIFFERENTIAL (CANCER CENTER ONLY)
Abs Immature Granulocytes: 0.02 10*3/uL (ref 0.00–0.07)
Basophils Absolute: 0 10*3/uL (ref 0.0–0.1)
Basophils Relative: 1 %
Eosinophils Absolute: 0 10*3/uL (ref 0.0–0.5)
Eosinophils Relative: 1 %
HCT: 32.8 % — ABNORMAL LOW (ref 36.0–46.0)
Hemoglobin: 10.2 g/dL — ABNORMAL LOW (ref 12.0–15.0)
Immature Granulocytes: 1 %
Lymphocytes Relative: 35 %
Lymphs Abs: 1.5 10*3/uL (ref 0.7–4.0)
MCH: 27.7 pg (ref 26.0–34.0)
MCHC: 31.1 g/dL (ref 30.0–36.0)
MCV: 89.1 fL (ref 80.0–100.0)
Monocytes Absolute: 0.7 10*3/uL (ref 0.1–1.0)
Monocytes Relative: 15 %
Neutro Abs: 2.1 10*3/uL (ref 1.7–7.7)
Neutrophils Relative %: 47 %
Platelet Count: 290 10*3/uL (ref 150–400)
RBC: 3.68 MIL/uL — ABNORMAL LOW (ref 3.87–5.11)
RDW: 22.1 % — ABNORMAL HIGH (ref 11.5–15.5)
WBC Count: 4.4 10*3/uL (ref 4.0–10.5)
nRBC: 0 % (ref 0.0–0.2)

## 2018-11-13 LAB — CMP (CANCER CENTER ONLY)
ALT: 51 U/L — ABNORMAL HIGH (ref 0–44)
AST: 45 U/L — ABNORMAL HIGH (ref 15–41)
Albumin: 4.1 g/dL (ref 3.5–5.0)
Alkaline Phosphatase: 111 U/L (ref 38–126)
Anion gap: 9 (ref 5–15)
BUN: 15 mg/dL (ref 6–20)
CO2: 26 mmol/L (ref 22–32)
Calcium: 9 mg/dL (ref 8.9–10.3)
Chloride: 105 mmol/L (ref 98–111)
Creatinine: 0.79 mg/dL (ref 0.44–1.00)
GFR, Est AFR Am: >60 mL/min (ref >60)
GFR, Estimated: >60 mL/min (ref >60)
Glucose, Bld: 115 mg/dL — ABNORMAL HIGH (ref 70–99)
Potassium: 4 mmol/L (ref 3.5–5.1)
Sodium: 140 mmol/L (ref 135–145)
Total Bilirubin: 0.4 mg/dL (ref 0.3–1.2)
Total Protein: 7.2 g/dL (ref 6.5–8.1)

## 2018-11-13 LAB — CEA (IN HOUSE-CHCC): CEA (CHCC-In House): 2.38 ng/mL (ref 0.00–5.00)

## 2018-11-13 MED ORDER — SODIUM CHLORIDE 0.9% FLUSH
10.0000 mL | Freq: Once | INTRAVENOUS | Status: AC
  Administered 2018-11-13: 10 mL
  Filled 2018-11-13: qty 10

## 2018-11-13 MED ORDER — HEPARIN SOD (PORK) LOCK FLUSH 100 UNIT/ML IV SOLN
500.0000 [IU] | Freq: Once | INTRAVENOUS | Status: AC
  Administered 2018-11-13: 12:00:00 500 [IU] via INTRAVENOUS
  Filled 2018-11-13: qty 5

## 2018-11-13 MED ORDER — SODIUM CHLORIDE 0.9% FLUSH
10.0000 mL | INTRAVENOUS | Status: DC | PRN
  Administered 2018-11-13: 10 mL via INTRAVENOUS
  Filled 2018-11-13: qty 10

## 2018-11-13 NOTE — Progress Notes (Addendum)
[place] OFFICE PROGRESS NOTE   Diagnosis: Small bowel carcinoma  INTERVAL HISTORY:   [name] returns as scheduled.  She completed cycle 8 FOLFOX 10/22/2018.  She had the same nausea she typically has following chemotherapy.  She noted some sores on her tongue.  No diarrhea.  She has constant numbness in the feet.  This affects balance at times.  She has mild tingling in the fingertips.  Pain is significantly better.  She is no longer on the Duragesic patch.  She notes less use of Dilaudid.  Objective:  Vital signs in last 24 hours:  Blood pressure (!) 142/92, pulse 81, temperature 98 F (36.7 C), temperature source Oral, resp. rate 17, height 5' 11"  (1.803 m), weight 227 lb 3.2 oz (103.1 kg), SpO2 97 %.    HEENT: No thrush or ulcers. Vascular: No leg edema.  Left lower leg is slightly larger than the right lower leg, chronic per patient report. Neuro: Vibratory sense mildly decreased over the fingertips per tuning fork exam. Skin: Palms without erythema. Port-A-Cath without erythema.   Lab Results:  Lab Results  Component Value Date   WBC 4.4 11/13/2018   HGB 10.2 (L) 11/13/2018   HCT 32.8 (L) 11/13/2018   MCV 89.1 11/13/2018   PLT 290 11/13/2018   NEUTROABS 2.1 11/13/2018    Imaging:  Ct Chest W Contrast  Result Date: 11/12/2018 CLINICAL DATA:  Duodenal adenocarcinoma. EXAM: CT CHEST, ABDOMEN, AND PELVIS WITH CONTRAST TECHNIQUE: Multidetector CT imaging of the chest, abdomen and pelvis was performed following the standard protocol during bolus administration of intravenous contrast. CONTRAST:  158m OMNIPAQUE IOHEXOL 300 MG/ML  SOLN COMPARISON:  09/06/2018. FINDINGS: CT CHEST FINDINGS Cardiovascular: Right IJ Port-A-Cath terminates at the SVC RA junction. Vascular structures are otherwise unremarkable. Heart size normal. No pericardial effusion. Mediastinum/Nodes: No pathologically enlarged mediastinal, hilar or axillary lymph nodes. Calcified left hilar  and subcarinal lymph nodes. Esophagus is grossly unremarkable. Lungs/Pleura: There has been interval decrease in size and number of multiple bilateral pulmonary nodules, many of which are cavitary in appearance. Largest nodule seen in the inferior right lower lobe, measuring 1.3 x 2.0 cm (series 7, image 117), compared to 1.9 x 2.9 cm on 09/06/2018. No pleural fluid. Airway is unremarkable. Musculoskeletal: No worrisome lytic or sclerotic lesions. CT ABDOMEN PELVIS FINDINGS Hepatobiliary: Liver may be slightly decreased in attenuation diffusely. Mild gallbladder wall edema and possible internal noncalcified stone. Pneumobilia is new. Pancreas: Negative. Spleen: Negative. Adrenals/Urinary Tract: Adrenal glands are unremarkable. A small stone is seen in the right kidney. Kidneys are otherwise unremarkable. Ureters are decompressed. Bladder is grossly unremarkable. Stomach/Bowel: Stomach is unremarkable. Difficult to assess the duodenal bulb due to lack of distension. Small bowel, appendix and colon are otherwise unremarkable. Vascular/Lymphatic: Vascular structures are unremarkable. 10 mm left external iliac lymph node is unchanged and nonspecific. Reproductive: Intrauterine contraceptive device is in place. No adnexal mass. Other: No free fluid. Musculoskeletal: No worrisome lytic or sclerotic lesions. IMPRESSION: 1. Continued improvement in pulmonary metastatic disease. 2. 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 Patient does have a history of a common bile duct stent. Please correlate clinically. 3. Hepatic steatosis. 4. Cholelithiasis with gallbladder wall edema and/or pericholecystic fluid, similar. Electronically Signed   By: MLorin PicketM.D.   On: 11/12/2018 13:20   Ct Abdomen Pelvis W Contrast  Result Date: 11/12/2018 CLINICAL DATA:  Duodenal adenocarcinoma. EXAM: CT CHEST, ABDOMEN, AND PELVIS WITH CONTRAST TECHNIQUE: Multidetector CT imaging of the  chest, abdomen and pelvis was  performed following the standard protocol during bolus administration of intravenous contrast. CONTRAST:  12m OMNIPAQUE IOHEXOL 300 MG/ML  SOLN COMPARISON:  09/06/2018. FINDINGS: CT CHEST FINDINGS Cardiovascular: Right IJ Port-A-Cath terminates at the SVC RA junction. Vascular structures are otherwise unremarkable. Heart size normal. No pericardial effusion. Mediastinum/Nodes: No pathologically enlarged mediastinal, hilar or axillary lymph nodes. Calcified left hilar and subcarinal lymph nodes. Esophagus is grossly unremarkable. Lungs/Pleura: There has been interval decrease in size and number of multiple bilateral pulmonary nodules, many of which are cavitary in appearance. Largest nodule seen in the inferior right lower lobe, measuring 1.3 x 2.0 cm (series 7, image 117), compared to 1.9 x 2.9 cm on 09/06/2018. No pleural fluid. Airway is unremarkable. Musculoskeletal: No worrisome lytic or sclerotic lesions. CT ABDOMEN PELVIS FINDINGS Hepatobiliary: Liver may be slightly decreased in attenuation diffusely. Mild gallbladder wall edema and possible internal noncalcified stone. Pneumobilia is new. Pancreas: Negative. Spleen: Negative. Adrenals/Urinary Tract: Adrenal glands are unremarkable. A small stone is seen in the right kidney. Kidneys are otherwise unremarkable. Ureters are decompressed. Bladder is grossly unremarkable. Stomach/Bowel: Stomach is unremarkable. Difficult to assess the duodenal bulb due to lack of distension. Small bowel, appendix and colon are otherwise unremarkable. Vascular/Lymphatic: Vascular structures are unremarkable. 10 mm left external iliac lymph node is unchanged and nonspecific. Reproductive: Intrauterine contraceptive device is in place. No adnexal mass. Other: No free fluid. Musculoskeletal: No worrisome lytic or sclerotic lesions. IMPRESSION: 1. Continued improvement in pulmonary metastatic disease. 2. 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 Patient does have  a history of a common bile duct stent. Please correlate clinically. 3. Hepatic steatosis. 4. Cholelithiasis with gallbladder wall edema and/or pericholecystic fluid, similar. Electronically Signed   By: MLorin PicketM.D.   On: 11/12/2018 13:20    Medications: I have reviewed the patient's current medications.  Assessment/Plan: 1.Adenocarcinoma of the duodenum  CT abdomen/pelvis 06/16/2018-changes of pancreatitis, fullness at the head of the pancreas without a defined mass, multiple subcentimeter peripancreatic lymph nodes, distal common bile duct stent, multiple nodules at the lung bases  EUS 06/14/2018-ulcerated mucosa/mass at the distal duodenal bulb with extension to the pancreas head, portal adenopathy, biopsy of duodenal mass-adenocarcinoma;foundation 1-microsatellite stable, tumor mutational burden 1, KRASG12D mutation  ERCP 06/14/2018-distal duodenal bulb mass, 1 cm proximal to the ampulla causing biliary obstruction, status post placement of a metal common bile duct stent  CT chest 06/20/2018-bilateral pulmonary nodules consistent with metastatic disease, supraclavicular, mediastinal, and hilar lymphadenopathy, changes of pancreatitis  Ultrasound-guided biopsy of a left supraclavicular lymph node 06/22/2018-metastatic adenocarcinoma  Cycle 1 FOLFOX 07/03/2018  Cycle 2 FOLFOX 07/17/2018  Cycle 3 FOLFOX 08/06/2018, Neulasta added  Cycle 4 FOLFOX 08/21/2018  CTs 09/06/2018- numerous small lung nodules generally decreased in size; new rounded masslike subpleural opacity right upper lobe with some evidence of internal cavitation,measuring 4.0 cm. Minimal fat stranding about the pancreas and duodenum which is decreased. Discrete mass is not noted. Previously seen common bile duct stent absent. Gallbladder wall thickening with faintly calcified gallstones. No biliary ductal dilatation.  Cycle 5 FOLFOX 09/10/2018(oxaliplatin dose reduceddue to elevated liver enzymes)  Cycle 6 FOLFOX  09/24/2018(oxaliplatin further dose reduced due to elevated liver enzymes)  Cycle 7 FOLFOX 10/09/2018  Cycle 8 FOLFOX 10/22/2018  CTs 11/12/2018- continued improvement in lung metastases.  Maintenance Xeloda, daily dosing   2.Biliary obstruction secondary to the duodenal mass, status post placement of a bile duct stent 06/14/2018 3.Post ERCP pancreatitis 06/16/2018 4.Anemia secondary  to phlebotomy and multiple procedures 5.G2, P2 6.Nausea secondary to #1  Upper GI 06/27/2018-moderate proximal duodenal narrowing, no evidence of gastric outlet obstruction 7.Attention deficit disorder 8. Abdominal pain- secondary to the small bowel carcinoma versus pancreatitis  9. Rash-improved 10. Neutropenia secondary to chemotherapy 07/31/2018 11.Elevated transaminases;improved 09/07/2018; improved 09/10/2018. Question related to chemotherapy. 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 appears stable.  She has completed 8 cycles of FOLFOX.  Recent restaging chest CT shows continued improvement in lung metastases.  She has progressive neuropathy symptoms and has likely achieved maximal benefit from oxaliplatin.  Dr. Benay Spice recommends discontinuing FOLFOX and beginning maintenance Xeloda.  The Xeloda will be dosed daily.  We reviewed potential toxicities associated with Xeloda including mouth sores, diarrhea, hand-foot syndrome, skin hyperpigmentation, increased sensitivity to sun.  She agrees with this plan.  She will return for lab and follow-up in approximately 3 weeks.  She will contact the office in the interim with any problems.  Patient seen with Dr. Benay Spice.  CT images reviewed on the computer with [name] at today's visit.  25 minutes were spent face-to-face at today's visit with the majority of that time involved in counseling/coordination of care.    Ned Card ANP/GNP-BC   11/13/2018  11:09 AM This was a shared visit with Ned Card.  [name] has  experienced a clinical and radiologic response to FOLFOX.  She has developed oxaliplatin neuropathy.  We discussed treatment options with her.  We reviewed the restaging CT images.  I recommend beginning maintenance capecitabine.  She agrees to proceed.  Julieanne Manson, MD

## 2018-11-14 ENCOUNTER — Telehealth: Payer: Self-pay | Admitting: Pharmacist

## 2018-11-14 ENCOUNTER — Telehealth: Payer: Self-pay | Admitting: Hematology

## 2018-11-14 ENCOUNTER — Telehealth: Payer: Self-pay | Admitting: Nurse Practitioner

## 2018-11-14 DIAGNOSIS — C170 Malignant neoplasm of duodenum: Secondary | ICD-10-CM

## 2018-11-14 MED ORDER — CAPECITABINE 500 MG PO TABS: 650.0000 mg/m2 | ORAL_TABLET | Freq: Two times a day (BID) | ORAL | 0 refills | Status: DC

## 2018-11-14 NOTE — Telephone Encounter (Signed)
Called and left msg

## 2018-11-14 NOTE — Telephone Encounter (Signed)
Oral Oncology Pharmacist Encounter  Received new prescription for Xeloda (capecitabine) for the maintenance treatment of metastatic small bowel cancer, planned duration until disease progression or unacceptable toxicity.  Original diagnosis in early 2020 with metastases noted Patient received front line treatment with FOLFOX chemotherapy x 8 cycles She has experience fairly extensive peripheral neuropathy secondary to the oxaliplatin  Patient is now under evaluation to initiate maintenance treatment with Xeloda Xeloda is planned to be administered at ~660 mg/m2 by mouth twice daily, with continuous dosing  Labs from 11/13/2018 assessed, OK for treatment initiation. Noted slight elevations in AST and ALT, will continue to be monitored  Current medication list in Epic reviewed, moderate DDIs with Xeloda identified:  Category C interaction with Xeloda and pantoprazole: conflicting retrospective data with one analysis showing decrease in OS and PFS when Xeloda was used for adjuvant treatment of gastric cancer concurrently with PPI, another study showing no effect on important efficacy outcomes. Patient will be screened for ability to discontinue PPI therapy. If unable to d/c PPI, no change to treatment is indicated.  Prescription has been e-scribed to [facility] for benefits analysis and approval per insurance requirement.  Oral Oncology Clinic will continue to follow for insurance authorization, copayment issues, initial counseling and start date.  Johny Drilling, PharmD, BCPS, BCOP  11/14/2018 12:55 PM Oral Oncology Clinic [phone]

## 2018-11-14 NOTE — Telephone Encounter (Signed)
Gave avs and calendar ° °

## 2018-11-18 ENCOUNTER — Other Ambulatory Visit: Payer: Self-pay | Admitting: Oncology

## 2018-11-19 ENCOUNTER — Other Ambulatory Visit: Payer: Self-pay | Admitting: Oncology

## 2018-11-19 NOTE — Telephone Encounter (Signed)
Oral Chemotherapy Pharmacist Encounter   Attempted to reach patient to provide update and offer for initial counseling on oral medication: Xeloda.  No answer.  Left voicemail for patient to call back to discuss details of medication acquisition and initial counseling session.  Johny Drilling, PharmD, BCPS, BCOP  11/19/2018   2:39 PM Oral Oncology Clinic [phone]

## 2018-11-19 NOTE — Telephone Encounter (Signed)
Oral Oncology Patient Advocate Encounter  I called [facility] to follow up on the Xeloda prescription. [facility] has everything they need from [place] and the prescription is in processing. They do not have copay information for me yet but did put in a request to expedite.  Oral Oncology clinic will continue to follow.  [place] [facility] Phone [phone] Fax [phone] 11/19/2018   2:13 PM

## 2018-11-20 NOTE — Telephone Encounter (Signed)
Oral Oncology Pharmacist Encounter  Received call from pharmacist at [facility] with request for Xeloda dose and frequency clarification. Pharmacy is used to Xeloda being given on a cycling schedule or daily on radiation days. Current Xeloda prescription is for "low-dose" daily continuous dosing. Instructions and dose clarified with specialty pharmacy. I also provided heigh, weight, BSA, and diagnosis code to pharmacy. Prescription will continue to be processed. They will reach out to patient to schedule delivery of her 1st cycle of Xeloda monotherapy once prescription processing is completed.  Johny Drilling, PharmD, BCPS, BCOP  11/20/2018 3:13 PM Oral Oncology Clinic [phone]

## 2018-11-20 NOTE — Telephone Encounter (Signed)
thanks

## 2018-11-20 NOTE — Telephone Encounter (Signed)
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   Counseled patient on administration, dosing, side effects, monitoring, drug-food interactions, safe handling, storage, and disposal.  Patient will take Xeloda 500mg  tablets, 3 tablets (1500mg ) by mouth in AM and 3 tablets (1500mg ) by mouth in PM, within 30 minutes of finishing meals, with continuous twice daily.   Xeloda start date: TBD, pending medication acquisition  Adverse effects include but are not limited to: fatigue, decreased blood counts, GI upset, diarrhea, mouth sores, and hand-foot syndrome.  Patient states she did experience some dryness on her hands and feet at the beginning of FOLFOX chemotherapy but that it had subsided over time.  Patient states she is taking Miralax daily to ensure regular bowel movements. She knows she may need to amend bowel regimen with daily Xeloda administration.    Patient will obtain anti diarrheal and alert the office of 4 or more loose stools above baseline.  Patient has anti-emetic on hand and knows to take it if nausea develops.  Reviewed with patient importance of keeping a medication schedule and plan for any missed doses.  Medication reconciliation performed and medication/allergy list updated.  Xeloda is being filled at [facility] per insurance requirement. Patient has not yet heard back from pharmacy to schedule medication shipment. Patient informed to start her Xeloda the morning after she receives it.  We will continue to follow-up with [facility] to ensure timely medication dispensing.  All questions answered.  Ms. Haft voiced understanding and appreciation.   Patient knows to call the office with questions or concerns.  Johny Drilling, PharmD, BCPS, BCOP  11/20/2018   9:37 AM Oral  Oncology Clinic [phone]

## 2018-11-21 NOTE — Telephone Encounter (Signed)
Oral Oncology Patient Advocate Encounter  I called [facility] to follow up on the Xeloda prescription.  I spoke with Doris Cheadle who stated that the copay would be $0. Diane called the patient while on the phone with me to schedule delivery and had to leave a voicemail. [facility] is able to ship to the patient today if she speaks with them.  Oral oncology clinic will continue to follow.  [place] Patient [place] Phone [phone] Fax [phone] 11/21/2018   10:09 AM

## 2018-11-22 NOTE — Telephone Encounter (Signed)
Oral Oncology Patient Advocate Encounter  I confirmed with [facility] that Xeloda shipped 7/8 to be delivered 7/10 with a $0 copay.  [place] Patient [place] Phone [phone] Fax [phone] 11/22/2018   3:11 PM

## 2018-12-03 ENCOUNTER — Inpatient Hospital Stay: Payer: 59 | Attending: Oncology | Admitting: Nurse Practitioner

## 2018-12-03 ENCOUNTER — Inpatient Hospital Stay: Payer: 59

## 2018-12-03 ENCOUNTER — Telehealth: Payer: Self-pay | Admitting: Nurse Practitioner

## 2018-12-03 ENCOUNTER — Encounter: Payer: Self-pay | Admitting: Nurse Practitioner

## 2018-12-03 ENCOUNTER — Other Ambulatory Visit: Payer: Self-pay

## 2018-12-03 VITALS — BP 126/77 | HR 85 | Temp 98.3°F | Resp 18 | Ht 71.0 in | Wt 226.2 lb

## 2018-12-03 DIAGNOSIS — D649 Anemia, unspecified: Secondary | ICD-10-CM | POA: Diagnosis not present

## 2018-12-03 DIAGNOSIS — T451X5A Adverse effect of antineoplastic and immunosuppressive drugs, initial encounter: Secondary | ICD-10-CM | POA: Diagnosis not present

## 2018-12-03 DIAGNOSIS — C170 Malignant neoplasm of duodenum: Secondary | ICD-10-CM

## 2018-12-03 DIAGNOSIS — G620 Drug-induced polyneuropathy: Secondary | ICD-10-CM

## 2018-12-03 DIAGNOSIS — D701 Agranulocytosis secondary to cancer chemotherapy: Secondary | ICD-10-CM

## 2018-12-03 DIAGNOSIS — C7800 Secondary malignant neoplasm of unspecified lung: Secondary | ICD-10-CM

## 2018-12-03 DIAGNOSIS — Z79899 Other long term (current) drug therapy: Secondary | ICD-10-CM | POA: Diagnosis not present

## 2018-12-03 DIAGNOSIS — Z95828 Presence of other vascular implants and grafts: Secondary | ICD-10-CM

## 2018-12-03 DIAGNOSIS — Z9221 Personal history of antineoplastic chemotherapy: Secondary | ICD-10-CM | POA: Diagnosis not present

## 2018-12-03 LAB — CMP (CANCER CENTER ONLY)
ALT: 33 U/L (ref 0–44)
AST: 30 U/L (ref 15–41)
Albumin: 4.1 g/dL (ref 3.5–5.0)
Alkaline Phosphatase: 90 U/L (ref 38–126)
Anion gap: 8 (ref 5–15)
BUN: 18 mg/dL (ref 6–20)
CO2: 25 mmol/L (ref 22–32)
Calcium: 9.3 mg/dL (ref 8.9–10.3)
Chloride: 108 mmol/L (ref 98–111)
Creatinine: 0.78 mg/dL (ref 0.44–1.00)
GFR, Est AFR Am: >60 mL/min (ref >60)
GFR, Estimated: >60 mL/min (ref >60)
Glucose, Bld: 91 mg/dL (ref 70–99)
Potassium: 4.2 mmol/L (ref 3.5–5.1)
Sodium: 141 mmol/L (ref 135–145)
Total Bilirubin: 0.4 mg/dL (ref 0.3–1.2)
Total Protein: 7.2 g/dL (ref 6.5–8.1)

## 2018-12-03 LAB — CBC WITH DIFFERENTIAL (CANCER CENTER ONLY)
Abs Immature Granulocytes: 0.01 10*3/uL (ref 0.00–0.07)
Basophils Absolute: 0 10*3/uL (ref 0.0–0.1)
Basophils Relative: 1 %
Eosinophils Absolute: 0.1 10*3/uL (ref 0.0–0.5)
Eosinophils Relative: 2 %
HCT: 33.4 % — ABNORMAL LOW (ref 36.0–46.0)
Hemoglobin: 10.3 g/dL — ABNORMAL LOW (ref 12.0–15.0)
Immature Granulocytes: 0 %
Lymphocytes Relative: 35 %
Lymphs Abs: 1.5 10*3/uL (ref 0.7–4.0)
MCH: 27.8 pg (ref 26.0–34.0)
MCHC: 30.8 g/dL (ref 30.0–36.0)
MCV: 90 fL (ref 80.0–100.0)
Monocytes Absolute: 0.5 10*3/uL (ref 0.1–1.0)
Monocytes Relative: 11 %
Neutro Abs: 2.2 10*3/uL (ref 1.7–7.7)
Neutrophils Relative %: 51 %
Platelet Count: 264 10*3/uL (ref 150–400)
RBC: 3.71 MIL/uL — ABNORMAL LOW (ref 3.87–5.11)
RDW: 18.8 % — ABNORMAL HIGH (ref 11.5–15.5)
WBC Count: 4.3 10*3/uL (ref 4.0–10.5)
nRBC: 0 % (ref 0.0–0.2)

## 2018-12-03 LAB — CEA (IN HOUSE-CHCC): CEA (CHCC-In House): 1.73 ng/mL (ref 0.00–5.00)

## 2018-12-03 MED ORDER — SODIUM CHLORIDE 0.9% FLUSH
10.0000 mL | Freq: Once | INTRAVENOUS | Status: AC
  Administered 2018-12-03: 10 mL
  Filled 2018-12-03: qty 10

## 2018-12-03 MED ORDER — HEPARIN SOD (PORK) LOCK FLUSH 100 UNIT/ML IV SOLN
500.0000 [IU] | Freq: Once | INTRAVENOUS | Status: AC
  Administered 2018-12-03: 500 [IU]
  Filled 2018-12-03: qty 5

## 2018-12-03 NOTE — Progress Notes (Signed)
[place] OFFICE PROGRESS NOTE   Diagnosis: Small bowel carcinoma  INTERVAL HISTORY:   Hailey Houston returns as scheduled.  She begin maintenance Xeloda on a daily dosing schedule 11/25/2018.  No significant nausea/vomiting.  No mouth sores.  No diarrhea.  She does note that stools are softer intermittently.  She relates this to her diet.  No hand or foot pain or redness.  She notes progressive numbness involving the hands and feet.  No abdominal pain.  She is no longer requiring pain medication.  Objective:  Vital signs in last 24 hours:  Blood pressure 126/77, pulse 85, temperature 98.3 F (36.8 C), temperature source Oral, resp. rate 18, height _0  (1.803 m), weight 226 lb 3.2 oz (102.6 kg), SpO2 98 %.    HEENT: No thrush or ulcers. GI: Abdomen soft and nontender.  No hepatomegaly. Vascular: No leg edema. Skin: Palms with mild erythema.  No skin breakdown. Port-A-Cath without erythema.   Lab Results:  Lab Results  Component Value Date   WBC 4.3 12/03/2018   HGB 10.3 (L) 12/03/2018   HCT 33.4 (L) 12/03/2018   MCV 90.0 12/03/2018   PLT 264 12/03/2018   NEUTROABS 2.2 12/03/2018    Imaging:  No results found.  Medications: I have reviewed the patient's current medications.  Assessment/Plan: 1.Adenocarcinoma of the duodenum  CT abdomen/pelvis 06/16/2018-changes of pancreatitis, fullness at the head of the pancreas without a defined mass, multiple subcentimeter peripancreatic lymph nodes, distal common bile duct stent, multiple nodules at the lung bases  EUS 06/14/2018-ulcerated mucosa/mass at the distal duodenal bulb with extension to the pancreas head, portal adenopathy, biopsy of duodenal mass-adenocarcinoma;foundation 1-microsatellite stable, tumor mutational burden 1, KRASG12D mutation  ERCP 06/14/2018-distal duodenal bulb mass, 1 cm proximal to the ampulla causing biliary obstruction, status post placement of a metal common bile duct stent  CT  chest 06/20/2018-bilateral pulmonary nodules consistent with metastatic disease, supraclavicular, mediastinal, and hilar lymphadenopathy, changes of pancreatitis  Ultrasound-guided biopsy of a left supraclavicular lymph node 06/22/2018-metastatic adenocarcinoma  Cycle 1 FOLFOX 07/03/2018  Cycle 2 FOLFOX 07/17/2018  Cycle 3 FOLFOX 08/06/2018, Neulasta added  Cycle 4 FOLFOX 08/21/2018  CTs 09/06/2018-numerous small lung nodules generally decreased in size; new rounded masslike subpleural opacity right upper lobe with some evidence of internal cavitation,measuring 4.0 cm. Minimal fat stranding about the pancreas and duodenum which is decreased. Discrete mass is not noted. Previously seen common bile duct stent absent. Gallbladder wall thickening with faintly calcified gallstones. No biliary ductal dilatation.  Cycle 5 FOLFOX 09/10/2018(oxaliplatin dose reduceddue to elevated liver enzymes)  Cycle 6 FOLFOX 09/24/2018(oxaliplatin further dose reduced due to elevated liver enzymes)  Cycle 7 FOLFOX 10/09/2018  Cycle 8 FOLFOX 10/22/2018  CTs 11/12/2018- continued improvement in lung metastases.  Maintenance Xeloda, daily dosing 11/25/2018   2.Biliary obstruction secondary to the duodenal mass, status post placement of a bile duct stent 06/14/2018 3.Post ERCP pancreatitis 06/16/2018 4.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 Abdominal pain-secondary to the small bowel carcinoma versus pancreatitis  9. Rash-improved 10. Neutropenia secondary to chemotherapy 07/31/2018 11.Elevated transaminases;improved 09/07/2018; improved 09/10/2018. Question related to chemotherapy. 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 Yearsley appears well.  There is no clinical evidence of disease progression.  She  will continue maintenance Xeloda daily dosing.  Overall she seems to be tolerating this well.  The increase in numbness involving  the hands and feet is most likely due to the previous Oxaliplatin.  She understands to contact the office if she develops painful neuropathy symptoms.  We reviewed the CBC from today.  Counts adequate to continue Xeloda as she is taking.  She will return for lab and follow-up in 3 weeks.  She will contact the office in the interim as outlined above or with any other problems.  Plan reviewed with Dr. Benay Spice.    Ned Card ANP/GNP-BC   12/03/2018  12:35 PM

## 2018-12-03 NOTE — Telephone Encounter (Signed)
Scheduled appt per 7/20 LOS - pt aware - per pt no print out needed , my chart active.

## 2018-12-06 ENCOUNTER — Other Ambulatory Visit: Payer: Self-pay | Admitting: *Deleted

## 2018-12-06 DIAGNOSIS — C170 Malignant neoplasm of duodenum: Secondary | ICD-10-CM

## 2018-12-06 MED ORDER — CAPECITABINE 500 MG PO TABS: 650.0000 mg/m2 | ORAL_TABLET | Freq: Two times a day (BID) | ORAL | 0 refills | Status: DC

## 2018-12-06 NOTE — Progress Notes (Signed)
Message from oral chemotherapy department that [facility] is requesting another script for Xeloda.

## 2018-12-14 ENCOUNTER — Other Ambulatory Visit: Payer: Self-pay | Admitting: Nurse Practitioner

## 2018-12-20 ENCOUNTER — Telehealth: Payer: Self-pay | Admitting: *Deleted

## 2018-12-20 ENCOUNTER — Other Ambulatory Visit: Payer: Self-pay | Admitting: Nurse Practitioner

## 2018-12-20 DIAGNOSIS — C170 Malignant neoplasm of duodenum: Secondary | ICD-10-CM

## 2018-12-20 DIAGNOSIS — K858 Other acute pancreatitis without necrosis or infection: Secondary | ICD-10-CM

## 2018-12-20 MED ORDER — LORAZEPAM 0.5 MG PO TABS: ORAL_TABLET | ORAL | 0 refills | Status: DC

## 2018-12-20 MED ORDER — HYDROMORPHONE HCL 4 MG PO TABS: 4.0000 mg | ORAL_TABLET | ORAL | 0 refills | Status: DC | PRN

## 2018-12-20 MED ORDER — PROMETHAZINE HCL 12.5 MG PO TABS: 12.5000 mg | ORAL_TABLET | Freq: Four times a day (QID) | ORAL | 1 refills | Status: DC | PRN

## 2018-12-20 NOTE — Telephone Encounter (Signed)
Requesting refills on hydromorphone, promethazine and lorazepam.

## 2018-12-24 ENCOUNTER — Other Ambulatory Visit: Payer: Self-pay

## 2018-12-24 ENCOUNTER — Inpatient Hospital Stay: Payer: 59 | Attending: Oncology | Admitting: Oncology

## 2018-12-24 ENCOUNTER — Inpatient Hospital Stay: Payer: 59

## 2018-12-24 VITALS — BP 133/80 | HR 90 | Temp 98.9°F | Resp 18 | Ht 71.0 in | Wt 229.4 lb

## 2018-12-24 DIAGNOSIS — G620 Drug-induced polyneuropathy: Secondary | ICD-10-CM | POA: Insufficient documentation

## 2018-12-24 DIAGNOSIS — C170 Malignant neoplasm of duodenum: Secondary | ICD-10-CM | POA: Insufficient documentation

## 2018-12-24 DIAGNOSIS — Z95828 Presence of other vascular implants and grafts: Secondary | ICD-10-CM

## 2018-12-24 DIAGNOSIS — D701 Agranulocytosis secondary to cancer chemotherapy: Secondary | ICD-10-CM | POA: Diagnosis not present

## 2018-12-24 DIAGNOSIS — D649 Anemia, unspecified: Secondary | ICD-10-CM | POA: Insufficient documentation

## 2018-12-24 LAB — CMP (CANCER CENTER ONLY)
ALT: 16 U/L (ref 0–44)
AST: 18 U/L (ref 15–41)
Albumin: 4.2 g/dL (ref 3.5–5.0)
Alkaline Phosphatase: 86 U/L (ref 38–126)
Anion gap: 10 (ref 5–15)
BUN: 14 mg/dL (ref 6–20)
CO2: 23 mmol/L (ref 22–32)
Calcium: 9.1 mg/dL (ref 8.9–10.3)
Chloride: 107 mmol/L (ref 98–111)
Creatinine: 0.82 mg/dL (ref 0.44–1.00)
GFR, Est AFR Am: >60 mL/min (ref >60)
GFR, Estimated: >60 mL/min (ref >60)
Glucose, Bld: 87 mg/dL (ref 70–99)
Potassium: 4.1 mmol/L (ref 3.5–5.1)
Sodium: 140 mmol/L (ref 135–145)
Total Bilirubin: 0.8 mg/dL (ref 0.3–1.2)
Total Protein: 6.9 g/dL (ref 6.5–8.1)

## 2018-12-24 LAB — CBC WITH DIFFERENTIAL (CANCER CENTER ONLY)
Abs Immature Granulocytes: 0.01 10*3/uL (ref 0.00–0.07)
Basophils Absolute: 0 10*3/uL (ref 0.0–0.1)
Basophils Relative: 1 %
Eosinophils Absolute: 0 10*3/uL (ref 0.0–0.5)
Eosinophils Relative: 1 %
HCT: 32.8 % — ABNORMAL LOW (ref 36.0–46.0)
Hemoglobin: 10.4 g/dL — ABNORMAL LOW (ref 12.0–15.0)
Immature Granulocytes: 0 %
Lymphocytes Relative: 32 %
Lymphs Abs: 1.2 10*3/uL (ref 0.7–4.0)
MCH: 29.5 pg (ref 26.0–34.0)
MCHC: 31.7 g/dL (ref 30.0–36.0)
MCV: 92.9 fL (ref 80.0–100.0)
Monocytes Absolute: 0.5 10*3/uL (ref 0.1–1.0)
Monocytes Relative: 13 %
Neutro Abs: 2 10*3/uL (ref 1.7–7.7)
Neutrophils Relative %: 53 %
Platelet Count: 222 10*3/uL (ref 150–400)
RBC: 3.53 MIL/uL — ABNORMAL LOW (ref 3.87–5.11)
RDW: 24.4 % — ABNORMAL HIGH (ref 11.5–15.5)
WBC Count: 3.7 10*3/uL — ABNORMAL LOW (ref 4.0–10.5)
nRBC: 0 % (ref 0.0–0.2)

## 2018-12-24 LAB — CEA (IN HOUSE-CHCC): CEA (CHCC-In House): 1.87 ng/mL (ref 0.00–5.00)

## 2018-12-24 MED ORDER — HEPARIN SOD (PORK) LOCK FLUSH 100 UNIT/ML IV SOLN
500.0000 [IU] | Freq: Once | INTRAVENOUS | Status: AC
  Administered 2018-12-24: 500 [IU]
  Filled 2018-12-24: qty 5

## 2018-12-24 MED ORDER — SODIUM CHLORIDE 0.9% FLUSH
10.0000 mL | Freq: Once | INTRAVENOUS | Status: AC
  Administered 2018-12-24: 10 mL
  Filled 2018-12-24: qty 10

## 2018-12-24 NOTE — Progress Notes (Signed)
[place] OFFICE PROGRESS NOTE   Diagnosis: Small bowel carcinoma  INTERVAL HISTORY:   Hailey Houston returns as scheduled.  She continues Xeloda.  No mouth sores, diarrhea, or hand/foot pain.  No significant abdominal pain.  She reports stiffness of the hands and elbows.  She has persistent neuropathy symptoms in the hands and feet.  Numbness extends into the lower legs.  Objective:  Vital signs in last 24 hours:  Blood pressure 133/80, pulse 90, temperature 98.9 F (37.2 C), temperature source Oral, resp. rate 18, height 5' 11" (1.803 m), weight 229 lb 6.4 oz (104.1 kg), SpO2 100 %.    HEENT: No thrush or ulcers GI: No hepatomegaly, nontender Vascular: No leg edema  Skin: Palms and soles without erythema or skin breakdown Musculoskeletal: No erythema or apparent edema at the hands  Portacath/PICC-without erythema  Lab Results:  Lab Results  Component Value Date   WBC 3.7 (L) 12/24/2018   HGB 10.4 (L) 12/24/2018   HCT 32.8 (L) 12/24/2018   MCV 92.9 12/24/2018   PLT 222 12/24/2018   NEUTROABS 2.0 12/24/2018    CMP  Lab Results  Component Value Date   NA 140 12/24/2018   K 4.1 12/24/2018   CL 107 12/24/2018   CO2 23 12/24/2018   GLUCOSE 87 12/24/2018   BUN 14 12/24/2018   CREATININE 0.82 12/24/2018   CALCIUM 9.1 12/24/2018   PROT 6.9 12/24/2018   ALBUMIN 4.2 12/24/2018   AST 18 12/24/2018   ALT 16 12/24/2018   ALKPHOS 86 12/24/2018   BILITOT 0.8 12/24/2018   GFRNONAA >60 12/24/2018   GFRAA >60 12/24/2018    Lab Results  Component Value Date   CEA1 1.73 12/03/2018     Medications: I have reviewed the patient's current medications.   Assessment/Plan: 1.Adenocarcinoma of the duodenum  CT abdomen/pelvis 06/16/2018-changes of pancreatitis, fullness at the head of the pancreas without a defined mass, multiple subcentimeter peripancreatic lymph nodes, distal common bile duct stent, multiple nodules at the lung bases  EUS  06/14/2018-ulcerated mucosa/mass at the distal duodenal bulb with extension to the pancreas head, portal adenopathy, biopsy of duodenal mass-adenocarcinoma;foundation 1-microsatellite stable, tumor mutational burden 1, KRASG12D mutation  ERCP 06/14/2018-distal duodenal bulb mass, 1 cm proximal to the ampulla causing biliary obstruction, status post placement of a metal common bile duct stent  CT chest 06/20/2018-bilateral pulmonary nodules consistent with metastatic disease, supraclavicular, mediastinal, and hilar lymphadenopathy, changes of pancreatitis  Ultrasound-guided biopsy of a left supraclavicular lymph node 06/22/2018-metastatic adenocarcinoma  Cycle 1 FOLFOX 07/03/2018  Cycle 2 FOLFOX 07/17/2018  Cycle 3 FOLFOX 08/06/2018, Neulasta added  Cycle 4 FOLFOX 08/21/2018  CTs 09/06/2018-numerous small lung nodules generally decreased in size; new rounded masslike subpleural opacity right upper lobe with some evidence of internal cavitation,measuring 4.0 cm. Minimal fat stranding about the pancreas and duodenum which is decreased. Discrete mass is not noted. Previously seen common bile duct stent absent. Gallbladder wall thickening with faintly calcified gallstones. No biliary ductal dilatation.  Cycle 5 FOLFOX 09/10/2018(oxaliplatin dose reduceddue to elevated liver enzymes)  Cycle 6 FOLFOX 09/24/2018(oxaliplatin further dose reduced due to elevated liver enzymes)  Cycle 7 FOLFOX 10/09/2018  Cycle 8 FOLFOX 10/22/2018  CTs 11/12/2018- continued improvement in lung metastases.  Maintenance Xeloda, daily dosing 11/25/2018   2.Biliary obstruction secondary to the duodenal mass, status post placement of a bile duct stent 06/14/2018 3.Post ERCP pancreatitis 06/16/2018 4.Anemia secondary to phlebotomy and multiple procedures 5.G2, P2 6.Nausea secondary to #1  Upper GI 06/27/2018-moderate proximal  duodenal narrowing, no evidence of gastric outlet obstruction 7.Attention deficit  disorder 8. Abdominal pain-secondary to the small bowel carcinoma versus pancreatitis  9. Rash-improved 10. Neutropenia secondary to chemotherapy 07/31/2018 11.Elevated transaminases;improved 09/07/2018; improved 09/10/2018. Question related to chemotherapy. 12.  Oxaliplatin neuropathy    Disposition: Hailey Houston appears to be tolerating the Xeloda well.  There is no clinical evidence of disease progression.  Hopefully the neuropathy symptoms will improve over the next few months.  She will return for an office and lab visit in approximately 3 weeks.  We will plan for a restaging CT evaluation within the next 2-3 months.  Hailey Coder, MD  12/24/2018  10:46 AM

## 2018-12-25 ENCOUNTER — Telehealth: Payer: Self-pay | Admitting: Oncology

## 2018-12-25 NOTE — Telephone Encounter (Signed)
Called and spoke with patient. Lost connection. Mailed printout

## 2018-12-26 ENCOUNTER — Telehealth: Payer: Self-pay

## 2018-12-26 NOTE — Telephone Encounter (Signed)
Called pt at request of Dr. Benay Spice to see what needs she has. She would like to reconnect with LCSW. Message sent to [facility] LCSW.

## 2018-12-28 ENCOUNTER — Encounter: Payer: Self-pay | Admitting: Nurse Practitioner

## 2018-12-28 ENCOUNTER — Encounter: Payer: Self-pay | Admitting: General Practice

## 2018-12-28 NOTE — Progress Notes (Signed)
[place] CSW Progress Notes  Call to patient to follow up on request from nurse navigator to schedule time to connect w patient.  No answer, left VM and requested call back.  Edwyna Shell, LCSW Clinical Social Worker Phone:  [phone] Cell:  [phone]

## 2019-01-04 ENCOUNTER — Inpatient Hospital Stay: Payer: 59 | Admitting: General Practice

## 2019-01-04 DIAGNOSIS — C170 Malignant neoplasm of duodenum: Secondary | ICD-10-CM

## 2019-01-04 NOTE — Progress Notes (Signed)
[place] CSW Progress Notes  WebEx video visit w patient to explore needs for support, resources.  Discussed understandable challenges of living w advanced cancer.  Problem solved ways to fully live while also living w advanced cancer diagnosis.  Currently transitioning from IV chemo to maintenance oral chemo.  Feels "75%" better, but still has significant neuropathy.  Finds herself "in my head" - thinking about cancer and its impact on her life/children, the future, worries.  Provided ways to distract self from "stuck" thinking, then transition to options for maintaining serenity through faith resources. Worked on "plan for living", including thinking about ways to weigh risk/benefit ratio for activities of value/ways to connect w support systems and create opportunities for family bonding. Reviewed importance of intentionally creating purpose/direction in life, making choices about activities/connections that bring joy to daily life.  Will meet again in two weeks, sent resources for additional support ([employer] and [employer]) via email.  Edwyna Shell, LCSW Clinical Social Worker Phone:  [phone] Ce;;  [phone]

## 2019-01-08 ENCOUNTER — Other Ambulatory Visit: Payer: Self-pay | Admitting: Nurse Practitioner

## 2019-01-08 NOTE — Telephone Encounter (Signed)
Given to Lisa to review 

## 2019-01-10 ENCOUNTER — Other Ambulatory Visit: Payer: Self-pay | Admitting: *Deleted

## 2019-01-10 MED ORDER — PANTOPRAZOLE SODIUM 40 MG PO TBEC: 40.0000 mg | DELAYED_RELEASE_TABLET | Freq: Two times a day (BID) | ORAL | 0 refills | Status: DC

## 2019-01-10 NOTE — Progress Notes (Signed)
Notification from pharmacy that patient's insurance requires 90 day supply of the pantoprazole 40 mg for coverage. Re-sent script for #180 w/no refills.

## 2019-01-15 ENCOUNTER — Inpatient Hospital Stay: Payer: 59

## 2019-01-15 ENCOUNTER — Other Ambulatory Visit: Payer: Self-pay

## 2019-01-15 ENCOUNTER — Telehealth: Payer: Self-pay | Admitting: *Deleted

## 2019-01-15 ENCOUNTER — Other Ambulatory Visit: Payer: Self-pay | Admitting: Nurse Practitioner

## 2019-01-15 ENCOUNTER — Telehealth: Payer: Self-pay | Admitting: Nurse Practitioner

## 2019-01-15 ENCOUNTER — Encounter: Payer: Self-pay | Admitting: Nurse Practitioner

## 2019-01-15 ENCOUNTER — Inpatient Hospital Stay: Payer: 59 | Attending: Oncology | Admitting: Nurse Practitioner

## 2019-01-15 VITALS — BP 138/81 | HR 111 | Temp 98.0°F | Resp 18 | Ht 71.0 in | Wt 233.3 lb

## 2019-01-15 DIAGNOSIS — D649 Anemia, unspecified: Secondary | ICD-10-CM | POA: Diagnosis not present

## 2019-01-15 DIAGNOSIS — C7800 Secondary malignant neoplasm of unspecified lung: Secondary | ICD-10-CM | POA: Insufficient documentation

## 2019-01-15 DIAGNOSIS — Z452 Encounter for adjustment and management of vascular access device: Secondary | ICD-10-CM | POA: Diagnosis not present

## 2019-01-15 DIAGNOSIS — C170 Malignant neoplasm of duodenum: Secondary | ICD-10-CM

## 2019-01-15 DIAGNOSIS — G620 Drug-induced polyneuropathy: Secondary | ICD-10-CM | POA: Diagnosis not present

## 2019-01-15 DIAGNOSIS — Z95828 Presence of other vascular implants and grafts: Secondary | ICD-10-CM

## 2019-01-15 DIAGNOSIS — D701 Agranulocytosis secondary to cancer chemotherapy: Secondary | ICD-10-CM | POA: Insufficient documentation

## 2019-01-15 DIAGNOSIS — Z23 Encounter for immunization: Secondary | ICD-10-CM | POA: Diagnosis not present

## 2019-01-15 LAB — CBC WITH DIFFERENTIAL (CANCER CENTER ONLY)
Abs Immature Granulocytes: 0.01 10*3/uL (ref 0.00–0.07)
Basophils Absolute: 0 10*3/uL (ref 0.0–0.1)
Basophils Relative: 0 %
Eosinophils Absolute: 0 10*3/uL (ref 0.0–0.5)
Eosinophils Relative: 1 %
HCT: 31.6 % — ABNORMAL LOW (ref 36.0–46.0)
Hemoglobin: 10.5 g/dL — ABNORMAL LOW (ref 12.0–15.0)
Immature Granulocytes: 0 %
Lymphocytes Relative: 31 %
Lymphs Abs: 1.5 10*3/uL (ref 0.7–4.0)
MCH: 32 pg (ref 26.0–34.0)
MCHC: 33.2 g/dL (ref 30.0–36.0)
MCV: 96.3 fL (ref 80.0–100.0)
Monocytes Absolute: 0.6 10*3/uL (ref 0.1–1.0)
Monocytes Relative: 12 %
Neutro Abs: 2.6 10*3/uL (ref 1.7–7.7)
Neutrophils Relative %: 56 %
Platelet Count: 258 10*3/uL (ref 150–400)
RBC: 3.28 MIL/uL — ABNORMAL LOW (ref 3.87–5.11)
WBC Count: 4.7 10*3/uL (ref 4.0–10.5)
nRBC: 0 % (ref 0.0–0.2)

## 2019-01-15 LAB — CMP (CANCER CENTER ONLY)
ALT: 19 U/L (ref 0–44)
AST: 21 U/L (ref 15–41)
Albumin: 4.4 g/dL (ref 3.5–5.0)
Alkaline Phosphatase: 101 U/L (ref 38–126)
Anion gap: 8 (ref 5–15)
BUN: 20 mg/dL (ref 6–20)
CO2: 25 mmol/L (ref 22–32)
Calcium: 9.2 mg/dL (ref 8.9–10.3)
Chloride: 107 mmol/L (ref 98–111)
Creatinine: 0.81 mg/dL (ref 0.44–1.00)
GFR, Est AFR Am: >60 mL/min (ref >60)
GFR, Estimated: >60 mL/min (ref >60)
Glucose, Bld: 98 mg/dL (ref 70–99)
Potassium: 3.8 mmol/L (ref 3.5–5.1)
Sodium: 140 mmol/L (ref 135–145)
Total Bilirubin: 1 mg/dL (ref 0.3–1.2)
Total Protein: 7 g/dL (ref 6.5–8.1)

## 2019-01-15 LAB — CEA (IN HOUSE-CHCC): CEA (CHCC-In House): 2.48 ng/mL (ref 0.00–5.00)

## 2019-01-15 MED ORDER — SODIUM CHLORIDE 0.9% FLUSH
10.0000 mL | Freq: Once | INTRAVENOUS | Status: AC
  Administered 2019-01-15: 10 mL
  Filled 2019-01-15: qty 10

## 2019-01-15 MED ORDER — HEPARIN SOD (PORK) LOCK FLUSH 100 UNIT/ML IV SOLN
500.0000 [IU] | Freq: Once | INTRAVENOUS | Status: AC
  Administered 2019-01-15: 500 [IU]
  Filled 2019-01-15: qty 5

## 2019-01-15 NOTE — Progress Notes (Signed)
[place] OFFICE PROGRESS NOTE   Diagnosis: Small bowel carcinoma  INTERVAL HISTORY:   Hailey Houston returns as scheduled.  She continues Xeloda.  She is having nausea after each dose of Xeloda.  No mouth ulcers though she does note some "soreness".  She has occasional diarrhea.  No hand or foot pain or redness.  Neuropathy symptoms are worse.  She notes urine has been darker over the past few weeks.  Objective:  Vital signs in last 24 hours:  Blood pressure 138/81, pulse (!) 111, temperature 98 F (36.7 C), temperature source Oral, resp. rate 18, height 5' 11"  (1.803 m), weight 233 lb 4.8 oz (105.8 kg), SpO2 98 %.    HEENT: No thrush or ulcers.  Sclera anicteric. GI: Abdomen soft and nontender.  No hepatomegaly. Vascular: No leg edema. Skin: Palms without erythema. Port-A-Cath without erythema.   Lab Results:  Lab Results  Component Value Date   WBC 4.7 01/15/2019   HGB 10.5 (L) 01/15/2019   HCT 31.6 (L) 01/15/2019   MCV 96.3 01/15/2019   PLT 258 01/15/2019   NEUTROABS 2.6 01/15/2019    Imaging:  No results found.  Medications: I have reviewed the patient's current medications.  Assessment/Plan: 1.Adenocarcinoma of the duodenum  CT abdomen/pelvis 06/16/2018-changes of pancreatitis, fullness at the head of the pancreas without a defined mass, multiple subcentimeter peripancreatic lymph nodes, distal common bile duct stent, multiple nodules at the lung bases  EUS 06/14/2018-ulcerated mucosa/mass at the distal duodenal bulb with extension to the pancreas head, portal adenopathy, biopsy of duodenal mass-adenocarcinoma;foundation 1-microsatellite stable, tumor mutational burden 1, KRASG12D mutation  ERCP 06/14/2018-distal duodenal bulb mass, 1 cm proximal to the ampulla causing biliary obstruction, status post placement of a metal common bile duct stent  CT chest 06/20/2018-bilateral pulmonary nodules consistent with metastatic disease, supraclavicular,  mediastinal, and hilar lymphadenopathy, changes of pancreatitis  Ultrasound-guided biopsy of a left supraclavicular lymph node 06/22/2018-metastatic adenocarcinoma  Cycle 1 FOLFOX 07/03/2018  Cycle 2 FOLFOX 07/17/2018  Cycle 3 FOLFOX 08/06/2018, Neulasta added  Cycle 4 FOLFOX 08/21/2018  CTs 09/06/2018-numerous small lung nodules generally decreased in size; new rounded masslike subpleural opacity right upper lobe with some evidence of internal cavitation,measuring 4.0 cm. Minimal fat stranding about the pancreas and duodenum which is decreased. Discrete mass is not noted. Previously seen common bile duct stent absent. Gallbladder wall thickening with faintly calcified gallstones. No biliary ductal dilatation.  Cycle 5 FOLFOX 09/10/2018(oxaliplatin dose reduceddue to elevated liver enzymes)  Cycle 6 FOLFOX 09/24/2018(oxaliplatin further dose reduced due to elevated liver enzymes)  Cycle 7 FOLFOX 10/09/2018  Cycle 8 FOLFOX 10/22/2018  CTs 11/12/2018-continued improvement in lung metastases.  Maintenance Xeloda, daily dosing 11/25/2018   2.Biliary obstruction secondary to the duodenal mass, status post placement of a bile duct stent 06/14/2018 3.Post ERCP pancreatitis 06/16/2018 4.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 Abdominal pain-secondary to the small bowel carcinoma versus pancreatitis  9. Rash-improved 10. Neutropenia secondary to chemotherapy 07/31/2018 11.Elevated transaminases;improved 09/07/2018; improved 09/10/2018. Question related to chemotherapy. 12.Oxaliplatin neuropathy   Disposition: Hailey Houston appears well.  There is no clinical evidence of disease progression.  She will continue Xeloda as she is currently taking.  We will follow-up on the CEA from today.  The plan  is for referral for restaging CTs after her next visit.  We reviewed the CBC from today.  Counts  adequate to continue with Xeloda as above.  She is having nausea with each dose of Xeloda.  She will try Compazine or Zofran as a premedication.  She will contact the office if this is not effective.  She will return for lab and follow-up in 3 weeks.  She will contact the office in the interim as outlined above or with any other problems.  Plan reviewed with Dr. Benay Spice.      Ned Card ANP/GNP-BC   01/15/2019  11:02 AM

## 2019-01-15 NOTE — Telephone Encounter (Signed)
Called and left msg. Mailed printout. Scheduled different date than los due to availability

## 2019-01-15 NOTE — Telephone Encounter (Signed)
Pt called in reference to CEA results from today 01/15/19 and would like for Ned Card NP to call with advice about the results. CEA results are within normal limits but mildly elevated from previous results.

## 2019-01-18 ENCOUNTER — Encounter: Payer: Self-pay | Admitting: General Practice

## 2019-01-18 ENCOUNTER — Other Ambulatory Visit: Payer: Self-pay | Admitting: *Deleted

## 2019-01-18 ENCOUNTER — Other Ambulatory Visit: Payer: Self-pay | Admitting: Nurse Practitioner

## 2019-01-18 ENCOUNTER — Inpatient Hospital Stay: Payer: 59 | Admitting: General Practice

## 2019-01-18 DIAGNOSIS — C170 Malignant neoplasm of duodenum: Secondary | ICD-10-CM

## 2019-01-18 MED ORDER — CAPECITABINE 500 MG PO TABS: 650.0000 mg/m2 | ORAL_TABLET | Freq: Two times a day (BID) | ORAL | 0 refills | Status: DC

## 2019-01-18 NOTE — Progress Notes (Signed)
Patient called requesting refill on her capecitabine for next week.

## 2019-01-18 NOTE — Progress Notes (Signed)
[place] CSW Progress Notes  Support session w patient via Web Ex.  Discussed ways to increase activation, set and reach goals related to personal health and cancer care.  Patient working on becoming for proactive in treatment process.  Obtained recommended book on mindfulness as applied to cancer patient experience - as she has been able to focus on staying in present, this has freed up mind to explore her goals regarding her treatment and life in general.  Explored connection between personal values, discrepancy between most highly valued items and time spent on them, then developed goals for action steps.  Will meet again in two weeks to review progress.  Edwyna Shell, LCSW Clinical Social Worker Phone:  [phone] Cell:  [phone]

## 2019-01-23 ENCOUNTER — Other Ambulatory Visit: Payer: Self-pay | Admitting: Nurse Practitioner

## 2019-01-23 DIAGNOSIS — C170 Malignant neoplasm of duodenum: Secondary | ICD-10-CM

## 2019-01-29 ENCOUNTER — Telehealth: Payer: Self-pay | Admitting: Oncology

## 2019-01-29 NOTE — Telephone Encounter (Signed)
Faxed medical records to Dr. Aleatha Borer at [phone] for referral, Release ID: [id number]

## 2019-02-01 ENCOUNTER — Inpatient Hospital Stay: Payer: 59 | Admitting: General Practice

## 2019-02-01 ENCOUNTER — Encounter: Payer: Self-pay | Admitting: General Practice

## 2019-02-01 ENCOUNTER — Other Ambulatory Visit: Payer: Self-pay | Admitting: Hematology

## 2019-02-01 ENCOUNTER — Telehealth: Payer: Self-pay | Admitting: *Deleted

## 2019-02-01 DIAGNOSIS — C170 Malignant neoplasm of duodenum: Secondary | ICD-10-CM

## 2019-02-01 DIAGNOSIS — K858 Other acute pancreatitis without necrosis or infection: Secondary | ICD-10-CM

## 2019-02-01 MED ORDER — HYDROMORPHONE HCL 4 MG PO TABS: 4.0000 mg | ORAL_TABLET | Freq: Three times a day (TID) | ORAL | 0 refills | Status: DC | PRN

## 2019-02-01 MED ORDER — LORAZEPAM 0.5 MG PO TABS: ORAL_TABLET | ORAL | 0 refills | Status: DC

## 2019-02-01 MED ORDER — PROMETHAZINE HCL 12.5 MG PO TABS: 12.5000 mg | ORAL_TABLET | Freq: Four times a day (QID) | ORAL | 1 refills | Status: DC | PRN

## 2019-02-01 NOTE — Progress Notes (Signed)
[place] CSW Progress Notes  Met with patient by WebEx to work on strategies to find purpose and control in midst of treatment for cancer.  [facility], clinical trials, additional information.  Targeting spending time w valued relationships including mother/sister/children, planning memorable vacation as well as maximizing impact of daily interactions. Strengthening bonds with important people in her life and her faith are important tasks at this time.    Edwyna Shell, LCSW Clinical Social Worker Phone:  [phone]

## 2019-02-01 NOTE — Telephone Encounter (Signed)
Requesting refills on hydromorphone, lorazepam, and promethazine. Forwarded hydromorphone request to Dr. Burr Medico. Others completed by RN

## 2019-02-03 ENCOUNTER — Other Ambulatory Visit: Payer: Self-pay | Admitting: Oncology

## 2019-02-04 ENCOUNTER — Other Ambulatory Visit: Payer: Self-pay

## 2019-02-04 ENCOUNTER — Ambulatory Visit (HOSPITAL_COMMUNITY)
Admission: RE | Admit: 2019-02-04 | Discharge: 2019-02-04 | Disposition: A | Discharge status: Home / Self Care | Payer: 59 | Source: Ambulatory Visit | Attending: Nurse Practitioner | Admitting: Nurse Practitioner

## 2019-02-04 DIAGNOSIS — C170 Malignant neoplasm of duodenum: Secondary | ICD-10-CM | POA: Insufficient documentation

## 2019-02-04 MED ORDER — SODIUM CHLORIDE (PF) 0.9 % IJ SOLN
INTRAMUSCULAR | Status: AC
  Filled 2019-02-04: qty 50

## 2019-02-04 MED ORDER — IOHEXOL 300 MG/ML  SOLN
100.0000 mL | Freq: Once | INTRAMUSCULAR | Status: AC | PRN
  Administered 2019-02-04: 100 mL via INTRAVENOUS

## 2019-02-06 ENCOUNTER — Inpatient Hospital Stay (HOSPITAL_BASED_OUTPATIENT_CLINIC_OR_DEPARTMENT_OTHER): Payer: 59 | Admitting: Oncology

## 2019-02-06 ENCOUNTER — Other Ambulatory Visit: Payer: Self-pay

## 2019-02-06 ENCOUNTER — Inpatient Hospital Stay: Payer: 59

## 2019-02-06 VITALS — BP 109/52 | HR 79 | Temp 98.0°F | Resp 18 | Ht 71.0 in | Wt 239.2 lb

## 2019-02-06 DIAGNOSIS — C170 Malignant neoplasm of duodenum: Secondary | ICD-10-CM

## 2019-02-06 DIAGNOSIS — Z95828 Presence of other vascular implants and grafts: Secondary | ICD-10-CM

## 2019-02-06 DIAGNOSIS — Z23 Encounter for immunization: Secondary | ICD-10-CM | POA: Diagnosis not present

## 2019-02-06 LAB — CBC WITH DIFFERENTIAL (CANCER CENTER ONLY)
Abs Immature Granulocytes: 0.01 10*3/uL (ref 0.00–0.07)
Basophils Absolute: 0 10*3/uL (ref 0.0–0.1)
Basophils Relative: 0 %
Eosinophils Absolute: 0.1 10*3/uL (ref 0.0–0.5)
Eosinophils Relative: 1 %
HCT: 29.1 % — ABNORMAL LOW (ref 36.0–46.0)
Hemoglobin: 9.9 g/dL — ABNORMAL LOW (ref 12.0–15.0)
Immature Granulocytes: 0 %
Lymphocytes Relative: 28 %
Lymphs Abs: 1.5 10*3/uL (ref 0.7–4.0)
MCH: 35.1 pg — ABNORMAL HIGH (ref 26.0–34.0)
MCHC: 34 g/dL (ref 30.0–36.0)
MCV: 103.2 fL — ABNORMAL HIGH (ref 80.0–100.0)
Monocytes Absolute: 0.7 10*3/uL (ref 0.1–1.0)
Monocytes Relative: 13 %
Neutro Abs: 3 10*3/uL (ref 1.7–7.7)
Neutrophils Relative %: 58 %
Platelet Count: 217 10*3/uL (ref 150–400)
RBC: 2.82 MIL/uL — ABNORMAL LOW (ref 3.87–5.11)
WBC Count: 5.4 10*3/uL (ref 4.0–10.5)
nRBC: 0 % (ref 0.0–0.2)

## 2019-02-06 LAB — CMP (CANCER CENTER ONLY)
ALT: 14 U/L (ref 0–44)
AST: 18 U/L (ref 15–41)
Albumin: 4.2 g/dL (ref 3.5–5.0)
Alkaline Phosphatase: 92 U/L (ref 38–126)
Anion gap: 9 (ref 5–15)
BUN: 17 mg/dL (ref 6–20)
CO2: 26 mmol/L (ref 22–32)
Calcium: 8.6 mg/dL — ABNORMAL LOW (ref 8.9–10.3)
Chloride: 103 mmol/L (ref 98–111)
Creatinine: 0.81 mg/dL (ref 0.44–1.00)
GFR, Est AFR Am: >60 mL/min (ref >60)
GFR, Estimated: >60 mL/min (ref >60)
Glucose, Bld: 93 mg/dL (ref 70–99)
Potassium: 4.1 mmol/L (ref 3.5–5.1)
Sodium: 138 mmol/L (ref 135–145)
Total Bilirubin: 0.9 mg/dL (ref 0.3–1.2)
Total Protein: 6.4 g/dL — ABNORMAL LOW (ref 6.5–8.1)

## 2019-02-06 LAB — CEA (IN HOUSE-CHCC): CEA (CHCC-In House): 2.51 ng/mL (ref 0.00–5.00)

## 2019-02-06 MED ORDER — HEPARIN SOD (PORK) LOCK FLUSH 100 UNIT/ML IV SOLN
500.0000 [IU] | Freq: Once | INTRAVENOUS | Status: AC
  Administered 2019-02-06: 500 [IU]
  Filled 2019-02-06: qty 5

## 2019-02-06 MED ORDER — INFLUENZA VAC SPLIT QUAD 0.5 ML IM SUSY
PREFILLED_SYRINGE | INTRAMUSCULAR | Status: AC
  Filled 2019-02-06: qty 0.5

## 2019-02-06 MED ORDER — SODIUM CHLORIDE 0.9% FLUSH
10.0000 mL | Freq: Once | INTRAVENOUS | Status: AC
  Administered 2019-02-06: 10 mL
  Filled 2019-02-06: qty 10

## 2019-02-06 MED ORDER — CAPECITABINE 500 MG PO TABS: 650.0000 mg/m2 | ORAL_TABLET | Freq: Two times a day (BID) | ORAL | 0 refills | Status: DC

## 2019-02-06 MED ORDER — INFLUENZA VAC SPLIT QUAD 0.5 ML IM SUSY
0.5000 mL | PREFILLED_SYRINGE | Freq: Once | INTRAMUSCULAR | Status: AC
  Administered 2019-02-06: 0.5 mL via INTRAMUSCULAR

## 2019-02-06 NOTE — Progress Notes (Signed)
[facility] OFFICE PROGRESS NOTE   Diagnosis: Small bowel carcinoma  INTERVAL HISTORY:   Ms. Brandner continues daily Xeloda.  No mouth sores, diarrhea, or hand/foot pain.  She has increased neuropathy symptoms in the feet and lower legs.  She has intermittent abdominal pain, but this remains improved.  She has intermittent nausea.  She is working.  Objective:  Vital signs in last 24 hours:  Blood pressure (!) 109/52, pulse 79, temperature 98 F (36.7 C), temperature source Oral, resp. rate 18, height 5' 11" (1.803 m), weight 239 lb 3.2 oz (108.5 kg), SpO2 99 %.   Limited physical examination secondary to distancing with the COVID pandemic HEENT: No thrush or ulcers GI: Nontender, no mass, no hepatosplenomegaly Vascular: No leg edema  Skin: Mild erythema and skin thickening at the palms and soles  Portacath/PICC-without erythema  Lab Results:  Lab Results  Component Value Date   WBC 5.4 02/06/2019   HGB 9.9 (L) 02/06/2019   HCT 29.1 (L) 02/06/2019   MCV 103.2 (H) 02/06/2019   PLT 217 02/06/2019   NEUTROABS 3.0 02/06/2019    CMP  Lab Results  Component Value Date   NA 138 02/06/2019   K 4.1 02/06/2019   CL 103 02/06/2019   CO2 26 02/06/2019   GLUCOSE 93 02/06/2019   BUN 17 02/06/2019   CREATININE 0.81 02/06/2019   CALCIUM 8.6 (L) 02/06/2019   PROT 6.4 (L) 02/06/2019   ALBUMIN 4.2 02/06/2019   AST 18 02/06/2019   ALT 14 02/06/2019   ALKPHOS 92 02/06/2019   BILITOT 0.9 02/06/2019   GFRNONAA >60 02/06/2019   GFRAA >60 02/06/2019    Lab Results  Component Value Date   CEA1 2.51 02/06/2019    Imaging:  Ct Chest W Contrast  Result Date: 02/04/2019 CLINICAL DATA:  Duodenal adenocarcinoma. EXAM: CT CHEST, ABDOMEN, AND PELVIS WITH CONTRAST TECHNIQUE: Multidetector CT imaging of the chest, abdomen and pelvis was performed following the standard protocol during bolus administration of intravenous contrast. CONTRAST:  165m OMNIPAQUE IOHEXOL 300  MG/ML  SOLN COMPARISON:  11/12/2018 FINDINGS: CT CHEST FINDINGS Cardiovascular: The heart size is normal. No substantial pericardial effusion. No thoracic aortic aneurysm. Right Port-A-Cath tip is positioned in the right atrium. Mediastinum/Nodes: No mediastinal lymphadenopathy. There is no hilar lymphadenopathy. The esophagus has normal imaging features. There is no axillary lymphadenopathy. Lungs/Pleura: The numerous bilateral pulmonary metastases seen on the prior study show continued interval decrease in size. 4 mm posterior right upper lobe nodule (61/6) was 7 mm previously. Many of the tiny pulmonary nodule seen previously have resolved completely in the interval. Index lesion in the posterior right costophrenic sulcus measured previously at 1.3 x 2.0 cm now measures 0.9 x 1.5 cm. No new or progressive pulmonary nodule or mass. No pleural effusion. Musculoskeletal: Small sclerotic focus in the T2 vertebral body is stable, likely a bone island but attention on follow-up recommended. CT ABDOMEN PELVIS FINDINGS Hepatobiliary: No suspicious focal abnormality within the liver parenchyma. There is no evidence for gallstones, gallbladder wall thickening, or pericholecystic fluid. No intrahepatic or extrahepatic biliary dilation. Liver attenuation appears less fatty today. The pneumobilia identified on the previous study is no longer evident. Gallbladder wall thickening seen on the previous exam has decreased. Noncalcified gallstone again noted. Pancreas: Diffuse atrophy of parenchymal volume with tiny gas bubble identified in the pancreatic head region, likely intraductal and consistent with prior sphincterotomy. Spleen: No splenomegaly. No focal mass lesion. Adrenals/Urinary Tract: No adrenal nodule or mass. Tiny nonobstructing  stone noted upper pole right kidney. Left kidney unremarkable. No evidence for hydroureter. The urinary bladder appears normal for the degree of distention. Stomach/Bowel: Stomach is  unremarkable. No gastric wall thickening. No evidence of outlet obstruction. Duodenum is normally positioned as is the ligament of Treitz. No small bowel wall thickening. No small bowel dilatation. The terminal ileum is normal. The appendix is normal. No gross colonic mass. No colonic wall thickening. Vascular/Lymphatic: No abdominal aortic aneurysm. No abdominal aortic atherosclerotic calcification. There is no gastrohepatic or hepatoduodenal ligament lymphadenopathy. No intraperitoneal or retroperitoneal lymphadenopathy. No pelvic sidewall lymphadenopathy. Reproductive: IUD visualized in the uterus. There is no adnexal mass. Other: Trace free fluid noted in the cul-de-sac. This can be a physiologic finding in a premenopausal female. Musculoskeletal: No worrisome lytic or sclerotic osseous abnormality. IMPRESSION: 1. Continued response to therapy with interval decrease in the numerous bilateral pulmonary metastases. Many, if not most of the tiny pulmonary nodules have resolved completely. No new or progressive pulmonary nodule or mass. 2. No evidence for metastatic disease in the abdomen or pelvis. 3. Interval decrease in hepatic steatosis. Noncalcified gallstone again identified with interval decrease in gallbladder wall thickening seen previously. 4. Tiny nonobstructing right renal stone. 5. Stable appearance small sclerotic focus in the T2 vertebral body, likely a bone island but attention on follow-up recommended. Electronically Signed   By: Misty Stanley M.D.   On: 02/04/2019 11:23   Ct Abdomen Pelvis W Contrast  Result Date: 02/04/2019 CLINICAL DATA:  Duodenal adenocarcinoma. EXAM: CT CHEST, ABDOMEN, AND PELVIS WITH CONTRAST TECHNIQUE: Multidetector CT imaging of the chest, abdomen and pelvis was performed following the standard protocol during bolus administration of intravenous contrast. CONTRAST:  152m OMNIPAQUE IOHEXOL 300 MG/ML  SOLN COMPARISON:  11/12/2018 FINDINGS: CT CHEST FINDINGS  Cardiovascular: The heart size is normal. No substantial pericardial effusion. No thoracic aortic aneurysm. Right Port-A-Cath tip is positioned in the right atrium. Mediastinum/Nodes: No mediastinal lymphadenopathy. There is no hilar lymphadenopathy. The esophagus has normal imaging features. There is no axillary lymphadenopathy. Lungs/Pleura: The numerous bilateral pulmonary metastases seen on the prior study show continued interval decrease in size. 4 mm posterior right upper lobe nodule (61/6) was 7 mm previously. Many of the tiny pulmonary nodule seen previously have resolved completely in the interval. Index lesion in the posterior right costophrenic sulcus measured previously at 1.3 x 2.0 cm now measures 0.9 x 1.5 cm. No new or progressive pulmonary nodule or mass. No pleural effusion. Musculoskeletal: Small sclerotic focus in the T2 vertebral body is stable, likely a bone island but attention on follow-up recommended. CT ABDOMEN PELVIS FINDINGS Hepatobiliary: No suspicious focal abnormality within the liver parenchyma. There is no evidence for gallstones, gallbladder wall thickening, or pericholecystic fluid. No intrahepatic or extrahepatic biliary dilation. Liver attenuation appears less fatty today. The pneumobilia identified on the previous study is no longer evident. Gallbladder wall thickening seen on the previous exam has decreased. Noncalcified gallstone again noted. Pancreas: Diffuse atrophy of parenchymal volume with tiny gas bubble identified in the pancreatic head region, likely intraductal and consistent with prior sphincterotomy. Spleen: No splenomegaly. No focal mass lesion. Adrenals/Urinary Tract: No adrenal nodule or mass. Tiny nonobstructing stone noted upper pole right kidney. Left kidney unremarkable. No evidence for hydroureter. The urinary bladder appears normal for the degree of distention. Stomach/Bowel: Stomach is unremarkable. No gastric wall thickening. No evidence of outlet  obstruction. Duodenum is normally positioned as is the ligament of Treitz. No small bowel wall thickening. No small bowel dilatation.  The terminal ileum is normal. The appendix is normal. No gross colonic mass. No colonic wall thickening. Vascular/Lymphatic: No abdominal aortic aneurysm. No abdominal aortic atherosclerotic calcification. There is no gastrohepatic or hepatoduodenal ligament lymphadenopathy. No intraperitoneal or retroperitoneal lymphadenopathy. No pelvic sidewall lymphadenopathy. Reproductive: IUD visualized in the uterus. There is no adnexal mass. Other: Trace free fluid noted in the cul-de-sac. This can be a physiologic finding in a premenopausal female. Musculoskeletal: No worrisome lytic or sclerotic osseous abnormality. IMPRESSION: 1. Continued response to therapy with interval decrease in the numerous bilateral pulmonary metastases. Many, if not most of the tiny pulmonary nodules have resolved completely. No new or progressive pulmonary nodule or mass. 2. No evidence for metastatic disease in the abdomen or pelvis. 3. Interval decrease in hepatic steatosis. Noncalcified gallstone again identified with interval decrease in gallbladder wall thickening seen previously. 4. Tiny nonobstructing right renal stone. 5. Stable appearance small sclerotic focus in the T2 vertebral body, likely a bone island but attention on follow-up recommended. Electronically Signed   By: Misty Stanley M.D.   On: 02/04/2019 11:23    Medications: I have reviewed the patient's current medications.   Assessment/Plan: 1. Adenocarcinoma of the duodenum  CT abdomen/pelvis 06/16/2018-changes of pancreatitis, fullness at the head of the pancreas without a defined mass, multiple subcentimeter peripancreatic lymph nodes, distal common bile duct stent, multiple nodules at the lung bases  EUS 06/14/2018-ulcerated mucosa/mass at the distal duodenal bulb with extension to the pancreas head, portal adenopathy, biopsy of  duodenal mass-adenocarcinoma;foundation 1-microsatellite stable, tumor mutational burden 1, KRASG12D mutation  ERCP 06/14/2018-distal duodenal bulb mass, 1 cm proximal to the ampulla causing biliary obstruction, status post placement of a metal common bile duct stent  CT chest 06/20/2018-bilateral pulmonary nodules consistent with metastatic disease, supraclavicular, mediastinal, and hilar lymphadenopathy, changes of pancreatitis  Ultrasound-guided biopsy of a left supraclavicular lymph node 06/22/2018-metastatic adenocarcinoma  Cycle 1 FOLFOX 07/03/2018  Cycle 2 FOLFOX 07/17/2018  Cycle 3 FOLFOX 08/06/2018, Neulasta added  Cycle 4 FOLFOX 08/21/2018  CTs 09/06/2018-numerous small lung nodules generally decreased in size; new rounded masslike subpleural opacity right upper lobe with some evidence of internal cavitation,measuring 4.0 cm. Minimal fat stranding about the pancreas and duodenum which is decreased. Discrete mass is not noted. Previously seen common bile duct stent absent. Gallbladder wall thickening with faintly calcified gallstones. No biliary ductal dilatation.  Cycle 5 FOLFOX 09/10/2018(oxaliplatin dose reduceddue to elevated liver enzymes)  Cycle 6 FOLFOX 09/24/2018(oxaliplatin further dose reduced due to elevated liver enzymes)  Cycle 7 FOLFOX 10/09/2018  Cycle 8 FOLFOX 10/22/2018  CTs 11/12/2018-continued improvement in lung metastases.  Maintenance Xeloda, daily dosing 11/25/2018  CTs 02/04/2019- decrease in bilateral nares nodules, many have resolved, no evidence of disease progression, no evidence of metastatic disease to the abdomen or pelvis   2.Biliary obstruction secondary to the duodenal mass, status post placement of a bile duct stent 06/14/2018 3.Post ERCP pancreatitis 06/16/2018 4.Anemia secondary to phlebotomy and multiple procedures 5.G2, P2 6.Nausea secondary to #1  Upper GI 06/27/2018-moderate proximal duodenal narrowing, no evidence of  gastric outlet obstruction 7.Attention deficit disorder 8. Abdominal pain-secondary to the small bowel carcinoma versus pancreatitis  9. Rash-improved 10. Neutropenia secondary to chemotherapy 07/31/2018 11.Elevated transaminases;improved 09/07/2018; improved 09/10/2018. Question related to chemotherapy. 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  She is tolerating Xeloda well.  The restaging CT reveals continued improvement in the lung metastases and no evidence of disease progression.  I reviewed the CT images with Hailey Houston.  She will continue Xeloda. She will return for an office and lab visit in 3 weeks.  Ms. Delphia Grates received an influenza vaccine today.  She continues to have symptoms from oxaliplatin neuropathy.  Hopefully this will improve over the next several months.  We discussed options for medical therapy if she develops pain or continuous tingling.  Betsy Coder, MD  02/06/2019  10:24 AM

## 2019-02-06 NOTE — Patient Instructions (Signed)

## 2019-02-07 ENCOUNTER — Telehealth: Payer: Self-pay | Admitting: Oncology

## 2019-02-07 NOTE — Telephone Encounter (Signed)
Called and left msg. Mailed printout  °

## 2019-02-15 ENCOUNTER — Inpatient Hospital Stay: Payer: 59 | Attending: Oncology | Admitting: General Practice

## 2019-02-15 DIAGNOSIS — D649 Anemia, unspecified: Secondary | ICD-10-CM | POA: Insufficient documentation

## 2019-02-15 DIAGNOSIS — D701 Agranulocytosis secondary to cancer chemotherapy: Secondary | ICD-10-CM | POA: Insufficient documentation

## 2019-02-15 DIAGNOSIS — C7800 Secondary malignant neoplasm of unspecified lung: Secondary | ICD-10-CM | POA: Insufficient documentation

## 2019-02-15 DIAGNOSIS — L271 Localized skin eruption due to drugs and medicaments taken internally: Secondary | ICD-10-CM | POA: Insufficient documentation

## 2019-02-15 DIAGNOSIS — G620 Drug-induced polyneuropathy: Secondary | ICD-10-CM | POA: Insufficient documentation

## 2019-02-15 DIAGNOSIS — C170 Malignant neoplasm of duodenum: Secondary | ICD-10-CM | POA: Insufficient documentation

## 2019-02-15 NOTE — Progress Notes (Signed)
[facility] CSW Progress Notes  Met w patient via WebEx.  Reviewed progress towards goals.  Had good visits with [facility] and [facility] oncologists, good reports on current treatment and its effects.  Working on planning future events including special trip w son, family vacation and holiday celebrations.  Continues to focus on staying in present, gratitude and spending time on activities and people of value to her.  Will continue to meet, next appointment in two weeks.  Edwyna Shell, LCSW Clinical Social Worker Phone:  [phone]

## 2019-02-21 ENCOUNTER — Other Ambulatory Visit: Payer: Self-pay

## 2019-02-21 DIAGNOSIS — Z20822 Contact with and (suspected) exposure to covid-19: Secondary | ICD-10-CM

## 2019-02-22 ENCOUNTER — Other Ambulatory Visit: Payer: Self-pay | Admitting: Oncology

## 2019-02-22 LAB — NOVEL CORONAVIRUS, NAA: SARS-CoV-2, NAA: NOT DETECTED

## 2019-02-25 ENCOUNTER — Telehealth: Payer: Self-pay | Admitting: *Deleted

## 2019-02-25 NOTE — Telephone Encounter (Addendum)
Left VM asking if it is OK for her to go to dentist now for exam and cleaning? Continues maintenance Xeloda. Per Dr. Benay Spice: OK to go to dentist now. Patient notified.

## 2019-02-26 ENCOUNTER — Other Ambulatory Visit: Payer: Self-pay

## 2019-02-26 ENCOUNTER — Inpatient Hospital Stay (HOSPITAL_BASED_OUTPATIENT_CLINIC_OR_DEPARTMENT_OTHER): Payer: 59 | Admitting: Nurse Practitioner

## 2019-02-26 ENCOUNTER — Encounter: Payer: Self-pay | Admitting: Nurse Practitioner

## 2019-02-26 ENCOUNTER — Inpatient Hospital Stay: Payer: 59

## 2019-02-26 VITALS — BP 124/71 | HR 80 | Temp 98.7°F | Resp 18 | Ht 71.0 in | Wt 235.5 lb

## 2019-02-26 DIAGNOSIS — C170 Malignant neoplasm of duodenum: Secondary | ICD-10-CM

## 2019-02-26 DIAGNOSIS — D649 Anemia, unspecified: Secondary | ICD-10-CM | POA: Diagnosis not present

## 2019-02-26 DIAGNOSIS — C7800 Secondary malignant neoplasm of unspecified lung: Secondary | ICD-10-CM | POA: Diagnosis not present

## 2019-02-26 DIAGNOSIS — G620 Drug-induced polyneuropathy: Secondary | ICD-10-CM | POA: Diagnosis not present

## 2019-02-26 DIAGNOSIS — D701 Agranulocytosis secondary to cancer chemotherapy: Secondary | ICD-10-CM | POA: Diagnosis not present

## 2019-02-26 DIAGNOSIS — Z95828 Presence of other vascular implants and grafts: Secondary | ICD-10-CM

## 2019-02-26 DIAGNOSIS — L271 Localized skin eruption due to drugs and medicaments taken internally: Secondary | ICD-10-CM | POA: Diagnosis not present

## 2019-02-26 LAB — CBC WITH DIFFERENTIAL (CANCER CENTER ONLY)
Abs Immature Granulocytes: 0 10*3/uL (ref 0.00–0.07)
Basophils Absolute: 0 10*3/uL (ref 0.0–0.1)
Basophils Relative: 1 %
Eosinophils Absolute: 0 10*3/uL (ref 0.0–0.5)
Eosinophils Relative: 1 %
HCT: 30 % — ABNORMAL LOW (ref 36.0–46.0)
Hemoglobin: 10.4 g/dL — ABNORMAL LOW (ref 12.0–15.0)
Immature Granulocytes: 0 %
Lymphocytes Relative: 41 %
Lymphs Abs: 1.5 10*3/uL (ref 0.7–4.0)
MCH: 38 pg — ABNORMAL HIGH (ref 26.0–34.0)
MCHC: 34.7 g/dL (ref 30.0–36.0)
MCV: 109.5 fL — ABNORMAL HIGH (ref 80.0–100.0)
Monocytes Absolute: 0.6 10*3/uL (ref 0.1–1.0)
Monocytes Relative: 17 %
Neutro Abs: 1.4 10*3/uL — ABNORMAL LOW (ref 1.7–7.7)
Neutrophils Relative %: 40 %
Platelet Count: 233 10*3/uL (ref 150–400)
RBC: 2.74 MIL/uL — ABNORMAL LOW (ref 3.87–5.11)
WBC Count: 3.5 10*3/uL — ABNORMAL LOW (ref 4.0–10.5)
nRBC: 0 % (ref 0.0–0.2)

## 2019-02-26 LAB — CMP (CANCER CENTER ONLY)
ALT: 16 U/L (ref 0–44)
AST: 22 U/L (ref 15–41)
Albumin: 4.5 g/dL (ref 3.5–5.0)
Alkaline Phosphatase: 100 U/L (ref 38–126)
Anion gap: 8 (ref 5–15)
BUN: 13 mg/dL (ref 6–20)
CO2: 27 mmol/L (ref 22–32)
Calcium: 8.8 mg/dL — ABNORMAL LOW (ref 8.9–10.3)
Chloride: 104 mmol/L (ref 98–111)
Creatinine: 0.75 mg/dL (ref 0.44–1.00)
GFR, Est AFR Am: >60 mL/min (ref >60)
GFR, Estimated: >60 mL/min (ref >60)
Glucose, Bld: 89 mg/dL (ref 70–99)
Potassium: 4 mmol/L (ref 3.5–5.1)
Sodium: 139 mmol/L (ref 135–145)
Total Bilirubin: 1.5 mg/dL — ABNORMAL HIGH (ref 0.3–1.2)
Total Protein: 7 g/dL (ref 6.5–8.1)

## 2019-02-26 MED ORDER — HEPARIN SOD (PORK) LOCK FLUSH 100 UNIT/ML IV SOLN
500.0000 [IU] | Freq: Once | INTRAVENOUS | Status: AC
  Administered 2019-02-26: 12:00:00 500 [IU]
  Filled 2019-02-26: qty 5

## 2019-02-26 MED ORDER — SODIUM CHLORIDE 0.9% FLUSH
10.0000 mL | Freq: Once | INTRAVENOUS | Status: AC
  Administered 2019-02-26: 12:00:00 10 mL
  Filled 2019-02-26: qty 10

## 2019-02-26 NOTE — Progress Notes (Addendum)
[facility] OFFICE PROGRESS NOTE   Diagnosis: Small bowel carcinoma  INTERVAL HISTORY:   Hailey Houston returns as scheduled.  She continues daily Xeloda.  She denies nausea/vomiting.  Mouth feels sore.  She has not noticed ulcers.  No diarrhea.  Palms and soles are painful.  She notes scattered cracks in the skin.  She continues to have neuropathy symptoms.  Objective:  Vital signs in last 24 hours:  Blood pressure 124/71, pulse 80, temperature 98.7 F (37.1 C), resp. rate 18, height _0  (1.803 m), weight 235 lb 8 oz (106.8 kg), SpO2 97 %.    HEENT: No thrush or ulcers. GI: Abdomen soft and nontender.  No hepatomegaly. Vascular: No leg edema. Skin: Palms and soles erythematous, areas of dry desquamation.  Erythematous rash at the upper chest. Port-A-Cath without erythema.   Lab Results:  Lab Results  Component Value Date   WBC 3.5 (L) 02/26/2019   HGB 10.4 (L) 02/26/2019   HCT 30.0 (L) 02/26/2019   MCV 109.5 (H) 02/26/2019   PLT 233 02/26/2019   NEUTROABS 1.4 (L) 02/26/2019    Imaging:  No results found.  Medications: I have reviewed the patient's current medications.  Assessment/Plan: 1. Adenocarcinoma of the duodenum  CT abdomen/pelvis 06/16/2018-changes of pancreatitis, fullness at the head of the pancreas without a defined mass, multiple subcentimeter peripancreatic lymph nodes, distal common bile duct stent, multiple nodules at the lung bases  EUS 06/14/2018-ulcerated mucosa/mass at the distal duodenal bulb with extension to the pancreas head, portal adenopathy, biopsy of duodenal mass-adenocarcinoma;foundation 1-microsatellite stable, tumor mutational burden 1, KRASG12D mutation  ERCP 06/14/2018-distal duodenal bulb mass, 1 cm proximal to the ampulla causing biliary obstruction, status post placement of a metal common bile duct stent  CT chest 06/20/2018-bilateral pulmonary nodules consistent with metastatic disease, supraclavicular,  mediastinal, and hilar lymphadenopathy, changes of pancreatitis  Ultrasound-guided biopsy of a left supraclavicular lymph node 06/22/2018-metastatic adenocarcinoma  Cycle 1 FOLFOX 07/03/2018  Cycle 2 FOLFOX 07/17/2018  Cycle 3 FOLFOX 08/06/2018, Neulasta added  Cycle 4 FOLFOX 08/21/2018  CTs 09/06/2018-numerous small lung nodules generally decreased in size; new rounded masslike subpleural opacity right upper lobe with some evidence of internal cavitation,measuring 4.0 cm. Minimal fat stranding about the pancreas and duodenum which is decreased. Discrete mass is not noted. Previously seen common bile duct stent absent. Gallbladder wall thickening with faintly calcified gallstones. No biliary ductal dilatation.  Cycle 5 FOLFOX 09/10/2018(oxaliplatin dose reduceddue to elevated liver enzymes)  Cycle 6 FOLFOX 09/24/2018(oxaliplatin further dose reduced due to elevated liver enzymes)  Cycle 7 FOLFOX 10/09/2018  Cycle 8 FOLFOX 10/22/2018  CTs 11/12/2018-continued improvement in lung metastases.  Maintenance Xeloda, daily dosing 11/25/2018  CTs 02/04/2019- decrease in bilateral lung nodules, many have resolved, no evidence of disease progression, no evidence of metastatic disease to the abdomen or pelvis  Xeloda continued  Xeloda placed on hold 02/26/2019 due to hand-foot syndrome   2.Biliary obstruction secondary to the duodenal mass, status post placement of a bile duct stent 06/14/2018 3.Post ERCP pancreatitis 06/16/2018 4.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 Abdominal pain-secondary to the small bowel carcinoma versus pancreatitis  9. Rash-improved 10. Neutropenia secondary to chemotherapy 07/31/2018 11.Elevated transaminases;improved 09/07/2018; improved 09/10/2018. Question  related to chemotherapy. 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  She is currently on daily Xeloda.  She has developed hand-foot syndrome.  We are placing Xeloda on hold beginning today with the plan to resume in 1 week at a dose of 1000 mg twice daily if symptoms are better.  If symptoms are not better she will contact the office.  She will return for lab and follow-up in 3 weeks.  She will contact the office in the interim as outlined above or with any other problems.  Patient seen with Dr. Benay Spice.    Ned Card ANP/GNP-BC   02/26/2019  1:01 PM  This was a shared visit with Ned Card.  Hailey Houston was interviewed and examined.  She has developed hand/foot syndrome secondary to Xeloda.  Xeloda will be placed on hold and she will receive with a dose reduction if the skin symptoms have improved in 1 week.  Hailey Manson, MD

## 2019-02-28 ENCOUNTER — Telehealth: Payer: Self-pay | Admitting: Nurse Practitioner

## 2019-02-28 NOTE — Telephone Encounter (Signed)
Called and left msg. Mailed printout  °

## 2019-03-01 ENCOUNTER — Inpatient Hospital Stay: Payer: 59 | Admitting: General Practice

## 2019-03-04 ENCOUNTER — Other Ambulatory Visit: Payer: Self-pay | Admitting: *Deleted

## 2019-03-04 DIAGNOSIS — C170 Malignant neoplasm of duodenum: Secondary | ICD-10-CM

## 2019-03-04 MED ORDER — CAPECITABINE 500 MG PO TABS: ORAL_TABLET | ORAL | 0 refills | Status: DC

## 2019-03-04 NOTE — Progress Notes (Signed)
Received faxed refill request from [facility] for Xeloda. OK to refill per MD with dose reduction to 1000 mg bid continuous.

## 2019-03-05 ENCOUNTER — Inpatient Hospital Stay: Payer: 59

## 2019-03-05 ENCOUNTER — Other Ambulatory Visit: Payer: Self-pay

## 2019-03-05 ENCOUNTER — Telehealth: Payer: Self-pay | Admitting: *Deleted

## 2019-03-05 DIAGNOSIS — C170 Malignant neoplasm of duodenum: Secondary | ICD-10-CM

## 2019-03-05 LAB — CMP (CANCER CENTER ONLY)
ALT: 16 U/L (ref 0–44)
AST: 22 U/L (ref 15–41)
Albumin: 4.3 g/dL (ref 3.5–5.0)
Alkaline Phosphatase: 94 U/L (ref 38–126)
Anion gap: 11 (ref 5–15)
BUN: 17 mg/dL (ref 6–20)
CO2: 24 mmol/L (ref 22–32)
Calcium: 8.9 mg/dL (ref 8.9–10.3)
Chloride: 104 mmol/L (ref 98–111)
Creatinine: 0.73 mg/dL (ref 0.44–1.00)
GFR, Est AFR Am: >60 mL/min (ref >60)
GFR, Estimated: >60 mL/min (ref >60)
Glucose, Bld: 96 mg/dL (ref 70–99)
Potassium: 3.8 mmol/L (ref 3.5–5.1)
Sodium: 139 mmol/L (ref 135–145)
Total Bilirubin: 1.4 mg/dL — ABNORMAL HIGH (ref 0.3–1.2)
Total Protein: 6.8 g/dL (ref 6.5–8.1)

## 2019-03-05 NOTE — Telephone Encounter (Addendum)
Patient called reporting she has developed pruritus anterior and posterior torso over past several days. Reports her 1st voided urine is tea colored and as she pushes fluids it gets lighter, but still darker than normal. She also thinks her sclera have a yellow color as well. Denies any rash, except the mild rash on her chest that MD has seen. No new detergents or exposures. Has not resumed her xeloda yet--due to begin 1000 mg bid tomorrow. She will hold until she hears what MD suggests. Per Dr. Benay Spice: Come in today for stat CMP. Hold Xeloda. May need to see GI if bili is elevated. Patient notified and will be here at 3:15 pm for lab Labs reviewed by MD at [facility] is stable. Per Dr. Benay Spice: OK to resume Xeloda at reduced dose of 1000 mg bid. Push po fluids and F/U on 11/3 as scheduled. Patient notified and agrees. She will try taking her prior script for itching and use lotions

## 2019-03-15 ENCOUNTER — Encounter: Payer: Self-pay | Admitting: General Practice

## 2019-03-15 ENCOUNTER — Inpatient Hospital Stay: Payer: 59 | Admitting: General Practice

## 2019-03-15 NOTE — Progress Notes (Signed)
[place] [facility] CSW Progress Notes  Met w patient by WebEx, continued to work on issues related to adjustment to illness, coping skills, managing strong emotions related to being diagnosed and treated for cancer at a relatively young age.  Processed ways to deal with strong feelings, both acknowledging them and keeping them in safe container.  Provided cognitive behavioral resource to help explore feelings and their messages.  She will be taking son on special trip next week, looking forward to this experience.  Edwyna Shell, LCSW Clinical Social Worker Phone:  [phone] Cell:  [phone]

## 2019-03-19 ENCOUNTER — Other Ambulatory Visit: Payer: Self-pay

## 2019-03-19 ENCOUNTER — Inpatient Hospital Stay: Payer: 59 | Attending: Oncology | Admitting: Oncology

## 2019-03-19 ENCOUNTER — Inpatient Hospital Stay: Payer: 59

## 2019-03-19 VITALS — BP 114/77 | HR 80 | Temp 98.5°F | Resp 17 | Ht 71.0 in | Wt 235.3 lb

## 2019-03-19 DIAGNOSIS — Z452 Encounter for adjustment and management of vascular access device: Secondary | ICD-10-CM | POA: Diagnosis not present

## 2019-03-19 DIAGNOSIS — C170 Malignant neoplasm of duodenum: Secondary | ICD-10-CM

## 2019-03-19 DIAGNOSIS — C7800 Secondary malignant neoplasm of unspecified lung: Secondary | ICD-10-CM | POA: Diagnosis not present

## 2019-03-19 DIAGNOSIS — G620 Drug-induced polyneuropathy: Secondary | ICD-10-CM | POA: Insufficient documentation

## 2019-03-19 DIAGNOSIS — D649 Anemia, unspecified: Secondary | ICD-10-CM | POA: Diagnosis not present

## 2019-03-19 DIAGNOSIS — K858 Other acute pancreatitis without necrosis or infection: Secondary | ICD-10-CM | POA: Diagnosis not present

## 2019-03-19 DIAGNOSIS — D701 Agranulocytosis secondary to cancer chemotherapy: Secondary | ICD-10-CM | POA: Diagnosis not present

## 2019-03-19 DIAGNOSIS — Z95828 Presence of other vascular implants and grafts: Secondary | ICD-10-CM

## 2019-03-19 LAB — CMP (CANCER CENTER ONLY)
ALT: 18 U/L (ref 0–44)
AST: 21 U/L (ref 15–41)
Albumin: 4.3 g/dL (ref 3.5–5.0)
Alkaline Phosphatase: 93 U/L (ref 38–126)
Anion gap: 8 (ref 5–15)
BUN: 13 mg/dL (ref 6–20)
CO2: 26 mmol/L (ref 22–32)
Calcium: 9.3 mg/dL (ref 8.9–10.3)
Chloride: 106 mmol/L (ref 98–111)
Creatinine: 0.78 mg/dL (ref 0.44–1.00)
GFR, Est AFR Am: >60 mL/min (ref >60)
GFR, Estimated: >60 mL/min (ref >60)
Glucose, Bld: 104 mg/dL — ABNORMAL HIGH (ref 70–99)
Potassium: 4.1 mmol/L (ref 3.5–5.1)
Sodium: 140 mmol/L (ref 135–145)
Total Bilirubin: 0.9 mg/dL (ref 0.3–1.2)
Total Protein: 7.1 g/dL (ref 6.5–8.1)

## 2019-03-19 LAB — CBC WITH DIFFERENTIAL (CANCER CENTER ONLY)
Abs Immature Granulocytes: 0 10*3/uL (ref 0.00–0.07)
Basophils Absolute: 0 10*3/uL (ref 0.0–0.1)
Basophils Relative: 1 %
Eosinophils Absolute: 0.1 10*3/uL (ref 0.0–0.5)
Eosinophils Relative: 1 %
HCT: 32.6 % — ABNORMAL LOW (ref 36.0–46.0)
Hemoglobin: 11.2 g/dL — ABNORMAL LOW (ref 12.0–15.0)
Immature Granulocytes: 0 %
Lymphocytes Relative: 38 %
Lymphs Abs: 1.6 10*3/uL (ref 0.7–4.0)
MCH: 38.4 pg — ABNORMAL HIGH (ref 26.0–34.0)
MCHC: 34.4 g/dL (ref 30.0–36.0)
MCV: 111.6 fL — ABNORMAL HIGH (ref 80.0–100.0)
Monocytes Absolute: 0.5 10*3/uL (ref 0.1–1.0)
Monocytes Relative: 11 %
Neutro Abs: 2 10*3/uL (ref 1.7–7.7)
Neutrophils Relative %: 49 %
Platelet Count: 233 10*3/uL (ref 150–400)
RBC: 2.92 MIL/uL — ABNORMAL LOW (ref 3.87–5.11)
RDW: 18.7 % — ABNORMAL HIGH (ref 11.5–15.5)
WBC Count: 4.1 10*3/uL (ref 4.0–10.5)
nRBC: 0 % (ref 0.0–0.2)

## 2019-03-19 MED ORDER — HYDROMORPHONE HCL 4 MG PO TABS: 4.0000 mg | ORAL_TABLET | Freq: Three times a day (TID) | ORAL | 0 refills | Status: DC | PRN

## 2019-03-19 MED ORDER — FAMOTIDINE 20 MG PO TABS: ORAL_TABLET | ORAL | 2 refills | Status: DC

## 2019-03-19 MED ORDER — LORAZEPAM 0.5 MG PO TABS: ORAL_TABLET | ORAL | 0 refills | Status: DC

## 2019-03-19 MED ORDER — HEPARIN SOD (PORK) LOCK FLUSH 100 UNIT/ML IV SOLN
500.0000 [IU] | Freq: Once | INTRAVENOUS | Status: AC
  Administered 2019-03-19: 500 [IU]
  Filled 2019-03-19: qty 5

## 2019-03-19 MED ORDER — SODIUM CHLORIDE 0.9% FLUSH
10.0000 mL | Freq: Once | INTRAVENOUS | Status: AC
  Administered 2019-03-19: 10:00:00 10 mL
  Filled 2019-03-19: qty 10

## 2019-03-19 NOTE — Progress Notes (Signed)
 [facility] OFF[facility] PROGRESS [facility]   Diagnosis: Small bowel carcinoma  INTERVAL HISTORY:   Hailey Houston resumed Xeloda approximately 2 weeks ago.  The hand-and-foot symptoms have improved.  She feels well.  She takes Dilaudid occasionally for abdominal pain.  She continues to have neuropathy symptoms in the feet. She had dark urine 2 weeks ago.  This improves when she drinks increased liquids.  No dysuria. Objective:  Vital signs in last 24 hours:  Blood pressure 114/77, pulse 80, temperature 98.5 F (36.9 C), temperature source Temporal, resp. rate 17, height 5' 11" (1.803 m), weight 235 lb 4.8 oz (106.7 kg), SpO2 98 %.    HEENT: No thrush or ulcers GI: No hepatosplenomegaly, no mass, nontender Vascular: No leg edema  Skin: Mild skin thickening and superficial cracking at the fingers, mild erythema, superficial desquamation, and callus formation over the soles  Portacath/PICC-without erythema  Lab Results:  Lab Results  Component Value Date   WBC 4.1 03/19/2019   HGB 11.2 (L) 03/19/2019   HCT 32.6 (L) 03/19/2019   MCV 111.6 (H) 03/19/2019   PLT 233 03/19/2019   NEUTROABS 2.0 03/19/2019    CMP  Lab Results  Component Value Date   NA 140 03/19/2019   K 4.1 03/19/2019   CL 106 03/19/2019   CO2 26 03/19/2019   GLUCOSE 104 (H) 03/19/2019   BUN 13 03/19/2019   CREATININE 0.78 03/19/2019   CALCIUM 9.3 03/19/2019   PROT 7.1 03/19/2019   ALBUMIN 4.3 03/19/2019   AST 21 03/19/2019   ALT 18 03/19/2019   ALKPHOS 93 03/19/2019   BILITOT 0.9 03/19/2019   GFRNONAA >60 03/19/2019   GFRAA >60 03/19/2019     Medications: I have reviewed the patient's current medications.   Assessment/Plan:   1.Adenocarcinoma of the duodenum  CT abdomen/pelvis 06/16/2018-changes of pancreatitis, fullness at the head of the pancreas without a defined mass, multiple subcentimeter peripancreatic lymph nodes, distal common bile duct stent, multiple nodules at the lung bases  EUS  06/14/2018-ulcerated mucosa/mass at the distal duodenal bulb with extension to the pancreas head, portal adenopathy, biopsy of duodenal mass-adenocarcinoma;foundation 1-microsatellite stable, tumor mutational burden 1, KRASG12D mutation  ERCP 06/14/2018-distal duodenal bulb mass, 1 cm proximal to the ampulla causing biliary obstruction, status post placement of a metal common bile duct stent  CT chest 06/20/2018-bilateral pulmonary nodules consistent with metastatic disease, supraclavicular, mediastinal, and hilar lymphadenopathy, changes of pancreatitis  Ultrasound-guided biopsy of a left supraclavicular lymph node 06/22/2018-metastatic adenocarcinoma  Cycle 1 FOLFOX 07/03/2018  Cycle 2 FOLFOX 07/17/2018  Cycle 3 FOLFOX 08/06/2018, Neulasta added  Cycle 4 FOLFOX 08/21/2018  CTs 09/06/2018-numerous small lung nodules generally decreased in size; new rounded masslike subpleural opacity right upper lobe with some evidence of internal cavitation,measuring 4.0 cm. Minimal fat stranding about the pancreas and duodenum which is decreased. Discrete mass is not noted. Previously seen common bile duct stent absent. Gallbladder wall thickening with faintly calcified gallstones. No biliary ductal dilatation.  Cycle 5 FOLFOX 09/10/2018(oxaliplatin dose reduceddue to elevated liver enzymes)  Cycle 6 FOLFOX 09/24/2018(oxaliplatin further dose reduced due to elevated liver enzymes)  Cycle 7 FOLFOX 10/09/2018  Cycle 8 FOLFOX 10/22/2018  CTs 11/12/2018-continued improvement in lung metastases.  Maintenance Xeloda, daily dosing 11/25/2018  CTs 02/04/2019- decrease in bilateral lung nodules, many have resolved, no evidence of disease progression, no evidence of metastatic disease to the abdomen or pelvis  Xeloda continued  Xeloda placed on hold 02/26/2019 due to hand-foot syndrome, resumed 03/06/2019 at a dose of   1000 mg twice daily   2.Biliary obstruction secondary to the duodenal mass, status post  placement of a bile duct stent 06/14/2018 3.Post ERCP pancreatitis 06/16/2018 4.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 Abdominal pain-secondary to the small bowel carcinoma versus pancreatitis  9. Rash-improved 10. Neutropenia secondary to chemotherapy 07/31/2018 11.Elevated transaminases;improved 09/07/2018; improved 09/10/2018. Question related to chemotherapy. 12.Oxaliplatin neuropathy    Disposition: Ms. Weller present Xeloda 2 weeks ago.  She appears to be tolerating the Xeloda well.  The hand/foot symptoms have improved.  She will continue Xeloda at the current dose.  She will return for an office and lab visit in 3 weeks.   , MD  03/19/2019  10:57 AM   

## 2019-03-20 ENCOUNTER — Telehealth: Payer: Self-pay | Admitting: Oncology

## 2019-03-20 NOTE — Telephone Encounter (Signed)
Scheduled per  [place]. Called and left msg. Mailed printout

## 2019-03-27 ENCOUNTER — Encounter: Payer: Self-pay | Admitting: General Practice

## 2019-03-27 ENCOUNTER — Inpatient Hospital Stay: Payer: 59 | Admitting: General Practice

## 2019-03-27 NOTE — Progress Notes (Signed)
[facility] CSW Progress Notes  Met w patient via WebEx for supportive counseling.  Processed emotions related to difficulties inherent in her situation as mother of teenage sons diagnosed with life altering disease.  Discussed ways to increase self care and self compassion, increase supportive faith community resources, connect with others who can help her develop wellness plan.  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  Will meet again in 3 weeks.  Hailey Shell, LCSW Clinical Social Worker Phone:  [phone]

## 2019-03-29 ENCOUNTER — Telehealth: Payer: Self-pay | Admitting: *Deleted

## 2019-03-29 ENCOUNTER — Other Ambulatory Visit: Payer: 59 | Admitting: General Practice

## 2019-03-29 NOTE — Telephone Encounter (Signed)
Left VM asking if patient has been compliant with her care? They have attempted to reach out to her without success. Called back and left VM that she is on continuous xeloda 1000 mg bid and very compliant with her care and appointments. She is doing well. Last in office 03/19/19 and returns on 04/09/19.

## 2019-04-09 ENCOUNTER — Inpatient Hospital Stay: Payer: 59

## 2019-04-09 ENCOUNTER — Inpatient Hospital Stay (HOSPITAL_BASED_OUTPATIENT_CLINIC_OR_DEPARTMENT_OTHER): Payer: 59 | Admitting: Oncology

## 2019-04-09 ENCOUNTER — Other Ambulatory Visit: Payer: Self-pay

## 2019-04-09 VITALS — BP 109/76 | HR 77 | Temp 98.7°F | Resp 17 | Ht 71.0 in | Wt 240.8 lb

## 2019-04-09 DIAGNOSIS — C170 Malignant neoplasm of duodenum: Secondary | ICD-10-CM | POA: Diagnosis not present

## 2019-04-09 DIAGNOSIS — Z95828 Presence of other vascular implants and grafts: Secondary | ICD-10-CM

## 2019-04-09 LAB — CMP (CANCER CENTER ONLY)
ALT: 22 U/L (ref 0–44)
AST: 25 U/L (ref 15–41)
Albumin: 4.1 g/dL (ref 3.5–5.0)
Alkaline Phosphatase: 79 U/L (ref 38–126)
Anion gap: 7 (ref 5–15)
BUN: 16 mg/dL (ref 6–20)
CO2: 27 mmol/L (ref 22–32)
Calcium: 8.8 mg/dL — ABNORMAL LOW (ref 8.9–10.3)
Chloride: 105 mmol/L (ref 98–111)
Creatinine: 0.77 mg/dL (ref 0.44–1.00)
GFR, Est AFR Am: >60 mL/min (ref >60)
GFR, Estimated: >60 mL/min (ref >60)
Glucose, Bld: 115 mg/dL — ABNORMAL HIGH (ref 70–99)
Potassium: 3.8 mmol/L (ref 3.5–5.1)
Sodium: 139 mmol/L (ref 135–145)
Total Bilirubin: 1 mg/dL (ref 0.3–1.2)
Total Protein: 6.7 g/dL (ref 6.5–8.1)

## 2019-04-09 LAB — CBC WITH DIFFERENTIAL (CANCER CENTER ONLY)
Abs Immature Granulocytes: 0.01 10*3/uL (ref 0.00–0.07)
Basophils Absolute: 0 10*3/uL (ref 0.0–0.1)
Basophils Relative: 0 %
Eosinophils Absolute: 0.1 10*3/uL (ref 0.0–0.5)
Eosinophils Relative: 1 %
HCT: 31.9 % — ABNORMAL LOW (ref 36.0–46.0)
Hemoglobin: 10.8 g/dL — ABNORMAL LOW (ref 12.0–15.0)
Immature Granulocytes: 0 %
Lymphocytes Relative: 26 %
Lymphs Abs: 1.2 10*3/uL (ref 0.7–4.0)
MCH: 38.8 pg — ABNORMAL HIGH (ref 26.0–34.0)
MCHC: 33.9 g/dL (ref 30.0–36.0)
MCV: 114.7 fL — ABNORMAL HIGH (ref 80.0–100.0)
Monocytes Absolute: 0.6 10*3/uL (ref 0.1–1.0)
Monocytes Relative: 13 %
Neutro Abs: 2.7 10*3/uL (ref 1.7–7.7)
Neutrophils Relative %: 60 %
Platelet Count: 221 10*3/uL (ref 150–400)
RBC: 2.78 MIL/uL — ABNORMAL LOW (ref 3.87–5.11)
RDW: 16.7 % — ABNORMAL HIGH (ref 11.5–15.5)
WBC Count: 4.6 10*3/uL (ref 4.0–10.5)
nRBC: 0 % (ref 0.0–0.2)

## 2019-04-09 MED ORDER — HEPARIN SOD (PORK) LOCK FLUSH 100 UNIT/ML IV SOLN
500.0000 [IU] | Freq: Once | INTRAVENOUS | Status: AC
  Administered 2019-04-09: 09:00:00 500 [IU]
  Filled 2019-04-09: qty 5

## 2019-04-09 MED ORDER — SODIUM CHLORIDE 0.9% FLUSH
10.0000 mL | Freq: Once | INTRAVENOUS | Status: AC
  Administered 2019-04-09: 10 mL
  Filled 2019-04-09: qty 10

## 2019-04-09 MED ORDER — ONDANSETRON HCL 8 MG PO TABS: ORAL_TABLET | ORAL | 1 refills | Status: DC

## 2019-04-09 NOTE — Progress Notes (Signed)
[facility] OFFICE PROGRESS NOTE   Diagnosis: Small bowel carcinoma  INTERVAL HISTORY:   Ms. Nebergall returns as scheduled.  She continues Xeloda.  No mouth sores or diarrhea.  She has noted increased malaise.  No hand or foot pain.  Abdominal pain remains improved.  Objective:  Vital signs in last 24 hours:  Blood pressure 109/76, pulse 77, temperature 98.7 F (37.1 C), temperature source Temporal, resp. rate 17, height 5' 11"  (1.803 m), weight 240 lb 12.8 oz (109.2 kg), SpO2 99 %.   Limited physical examination secondary to distancing with the Covid pandemic GI: Mild tenderness throughout the upper abdomen.  No mass.  No hepatomegaly. Vascular: No leg edema.  Skin: Dryness and mild erythema at the soles, mild skin thickening of the distal fingers  Portacath/PICC-without erythema  Lab Results:  Lab Results  Component Value Date   WBC 4.6 04/09/2019   HGB 10.8 (L) 04/09/2019   HCT 31.9 (L) 04/09/2019   MCV 114.7 (H) 04/09/2019   PLT 221 04/09/2019   NEUTROABS 2.7 04/09/2019    CMP  Lab Results  Component Value Date   NA 139 04/09/2019   K 3.8 04/09/2019   CL 105 04/09/2019   CO2 27 04/09/2019   GLUCOSE 115 (H) 04/09/2019   BUN 16 04/09/2019   CREATININE 0.77 04/09/2019   CALCIUM 8.8 (L) 04/09/2019   PROT 6.7 04/09/2019   ALBUMIN 4.1 04/09/2019   AST 25 04/09/2019   ALT 22 04/09/2019   ALKPHOS 79 04/09/2019   BILITOT 1.0 04/09/2019   GFRNONAA >60 04/09/2019   GFRAA >60 04/09/2019    Lab Results  Component Value Date   CEA1 2.51 02/06/2019    Lab Results  Component Value Date   INR 1.13 07/06/2018    Imaging:  No results found.  Medications: I have reviewed the patient's current medications.   Assessment/Plan: 1.  .Adenocarcinoma of the duodenum  CT abdomen/pelvis 06/16/2018-changes of pancreatitis, fullness at the head of the pancreas without a defined mass, multiple subcentimeter peripancreatic lymph nodes, distal common bile  duct stent, multiple nodules at the lung bases  EUS 06/14/2018-ulcerated mucosa/mass at the distal duodenal bulb with extension to the pancreas head, portal adenopathy, biopsy of duodenal mass-adenocarcinoma;foundation 1-microsatellite stable, tumor mutational burden 1, KRASG12D mutation  ERCP 06/14/2018-distal duodenal bulb mass, 1 cm proximal to the ampulla causing biliary obstruction, status post placement of a metal common bile duct stent  CT chest 06/20/2018-bilateral pulmonary nodules consistent with metastatic disease, supraclavicular, mediastinal, and hilar lymphadenopathy, changes of pancreatitis  Ultrasound-guided biopsy of a left supraclavicular lymph node 06/22/2018-metastatic adenocarcinoma  Cycle 1 FOLFOX 07/03/2018  Cycle 2 FOLFOX 07/17/2018  Cycle 3 FOLFOX 08/06/2018, Neulasta added  Cycle 4 FOLFOX 08/21/2018  CTs 09/06/2018-numerous small lung nodules generally decreased in size; new rounded masslike subpleural opacity right upper lobe with some evidence of internal cavitation,measuring 4.0 cm. Minimal fat stranding about the pancreas and duodenum which is decreased. Discrete mass is not noted. Previously seen common bile duct stent absent. Gallbladder wall thickening with faintly calcified gallstones. No biliary ductal dilatation.  Cycle 5 FOLFOX 09/10/2018(oxaliplatin dose reduceddue to elevated liver enzymes)  Cycle 6 FOLFOX 09/24/2018(oxaliplatin further dose reduced due to elevated liver enzymes)  Cycle 7 FOLFOX 10/09/2018  Cycle 8 FOLFOX 10/22/2018  CTs 11/12/2018-continued improvement in lung metastases.  Maintenance Xeloda, daily dosing 11/25/2018  CTs 02/04/2019- decrease in bilateral lung nodules, many have resolved, no evidence of disease progression, no evidence of metastatic disease to the abdomen or  pelvis  Xeloda continued  Xeloda placed on hold 02/26/2019 due to hand-foot syndrome, resumed 03/06/2019 at a dose of 1000 mg twice daily   2.Biliary  obstruction secondary to the duodenal mass, status post placement of a bile duct stent 06/14/2018 3.Post ERCP pancreatitis 06/16/2018 4.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 Abdominal pain-secondary to the small bowel carcinoma versus pancreatitis  9. Rash-improved 10. Neutropenia secondary to chemotherapy 07/31/2018 11.Elevated transaminases;improved 09/07/2018; improved 09/10/2018. Question related to chemotherapy. 12.Oxaliplatin neuropathy   Disposition: Ms. Tsukamoto appears unchanged.  She is tolerating the Xeloda well.  The increased malaise may be related to cumulative toxicity from systemic therapy.  She will continue Xeloda.  She will return for an office visit and restaging CTs on 05/13/2019.  She will contact us in the interim as needed.  Betsy Coder, MD  04/09/2019  2:39 PM

## 2019-04-09 NOTE — Patient Instructions (Signed)

## 2019-04-10 ENCOUNTER — Telehealth: Payer: Self-pay | Admitting: Oncology

## 2019-04-10 NOTE — Telephone Encounter (Signed)
Scheduled per los. Called and spoke with patient. Confirmed appt 

## 2019-04-19 ENCOUNTER — Inpatient Hospital Stay: Payer: 59 | Attending: Oncology | Admitting: General Practice

## 2019-04-19 DIAGNOSIS — G620 Drug-induced polyneuropathy: Secondary | ICD-10-CM | POA: Insufficient documentation

## 2019-04-19 DIAGNOSIS — T451X5A Adverse effect of antineoplastic and immunosuppressive drugs, initial encounter: Secondary | ICD-10-CM | POA: Insufficient documentation

## 2019-04-19 DIAGNOSIS — C7800 Secondary malignant neoplasm of unspecified lung: Secondary | ICD-10-CM | POA: Insufficient documentation

## 2019-04-19 DIAGNOSIS — D649 Anemia, unspecified: Secondary | ICD-10-CM | POA: Insufficient documentation

## 2019-04-19 DIAGNOSIS — C170 Malignant neoplasm of duodenum: Secondary | ICD-10-CM | POA: Insufficient documentation

## 2019-04-19 DIAGNOSIS — C772 Secondary and unspecified malignant neoplasm of intra-abdominal lymph nodes: Secondary | ICD-10-CM | POA: Insufficient documentation

## 2019-04-19 DIAGNOSIS — D701 Agranulocytosis secondary to cancer chemotherapy: Secondary | ICD-10-CM | POA: Insufficient documentation

## 2019-04-19 DIAGNOSIS — R970 Elevated carcinoembryonic antigen [CEA]: Secondary | ICD-10-CM | POA: Insufficient documentation

## 2019-04-19 NOTE — Progress Notes (Signed)
[facility] CSW Progress Notes  Met w patient via Web Ex.  Discussed balance between acceptance and change in regard to living w cancer.  Struggles w physical limitations including neuropathy in feet and energy limitations.  Discussed ways to both accept her current situation and explored options for changes that might allow her to spend her energy resources in ways she desires.  Will meet again in two weeks.  Edwyna Shell, LCSW Clinical Social Worker Phone:  [phone] Cell:  [phone]

## 2019-04-23 ENCOUNTER — Other Ambulatory Visit: Payer: Self-pay | Admitting: *Deleted

## 2019-04-23 DIAGNOSIS — C170 Malignant neoplasm of duodenum: Secondary | ICD-10-CM

## 2019-04-23 MED ORDER — CAPECITABINE 500 MG PO TABS: ORAL_TABLET | ORAL | 0 refills | Status: DC

## 2019-04-29 ENCOUNTER — Other Ambulatory Visit: Payer: Self-pay | Admitting: Oncology

## 2019-05-03 ENCOUNTER — Inpatient Hospital Stay: Payer: 59 | Admitting: General Practice

## 2019-05-03 DIAGNOSIS — C170 Malignant neoplasm of duodenum: Secondary | ICD-10-CM

## 2019-05-03 NOTE — Progress Notes (Signed)
[facility] CSW Progress Notes  Met via Web Ex.  Discussed ways to accept current physical limitations (neuropathy in hands and feet and fatigue). Discussed acceptance of physical limitations and need to accept increased help from others for tasks of lesser importance vs preserving energy for tasks more highly valued (interactions with children and maintaining job responsibilities).  Has upcoming scan 12/28.  Will meet again 1.8.  Edwyna Shell, LCSW Clinical Social Worker Phone:  [phone] Cell:  [phone]

## 2019-05-06 ENCOUNTER — Telehealth: Payer: Self-pay | Admitting: *Deleted

## 2019-05-06 MED ORDER — ESCITALOPRAM OXALATE 5 MG PO TABS: 5.0000 mg | ORAL_TABLET | Freq: Every day | ORAL | 1 refills | Status: DC

## 2019-05-06 NOTE — Telephone Encounter (Addendum)
Calling to request MD start her on an anti-depressant. She finds herself in a constant state of anxiety/worry and does not know how to get her thoughts back to positive frame. The ativan only causes her to be drowsy, but not help her situation. Her family has strongly encouraged her to call and ask for this. Informed her that it can take 2-3 weeks for an antidepressant to start taking effect. She understands this. She has been having WebEx visits with CSW at [facility] as well. Per Dr. Benay Spice: Start Lexapro 5 mg daily. Check for potential interactions first w/pharmacy. Per pharmacy: no interactions noted. May want to consider EKG if dose increased to 20 mg/day to follow QTc due to potential to prolong w/xeloda. Notified patient that script was sent in.

## 2019-05-13 ENCOUNTER — Inpatient Hospital Stay (HOSPITAL_BASED_OUTPATIENT_CLINIC_OR_DEPARTMENT_OTHER): Payer: 59 | Admitting: Oncology

## 2019-05-13 ENCOUNTER — Inpatient Hospital Stay: Payer: 59

## 2019-05-13 ENCOUNTER — Ambulatory Visit (HOSPITAL_COMMUNITY)
Admission: RE | Admit: 2019-05-13 | Discharge: 2019-05-13 | Disposition: A | Discharge status: Home / Self Care | Payer: 59 | Source: Ambulatory Visit | Attending: Oncology | Admitting: Oncology

## 2019-05-13 ENCOUNTER — Other Ambulatory Visit: Payer: Self-pay

## 2019-05-13 VITALS — BP 122/76 | HR 68 | Temp 98.0°F | Resp 16 | Ht 71.0 in | Wt 239.1 lb

## 2019-05-13 DIAGNOSIS — C7800 Secondary malignant neoplasm of unspecified lung: Secondary | ICD-10-CM | POA: Diagnosis not present

## 2019-05-13 DIAGNOSIS — D649 Anemia, unspecified: Secondary | ICD-10-CM | POA: Diagnosis not present

## 2019-05-13 DIAGNOSIS — Z95828 Presence of other vascular implants and grafts: Secondary | ICD-10-CM

## 2019-05-13 DIAGNOSIS — C170 Malignant neoplasm of duodenum: Secondary | ICD-10-CM

## 2019-05-13 DIAGNOSIS — R970 Elevated carcinoembryonic antigen [CEA]: Secondary | ICD-10-CM | POA: Diagnosis not present

## 2019-05-13 DIAGNOSIS — T451X5A Adverse effect of antineoplastic and immunosuppressive drugs, initial encounter: Secondary | ICD-10-CM | POA: Diagnosis not present

## 2019-05-13 DIAGNOSIS — G620 Drug-induced polyneuropathy: Secondary | ICD-10-CM | POA: Diagnosis not present

## 2019-05-13 DIAGNOSIS — C772 Secondary and unspecified malignant neoplasm of intra-abdominal lymph nodes: Secondary | ICD-10-CM | POA: Diagnosis not present

## 2019-05-13 DIAGNOSIS — D701 Agranulocytosis secondary to cancer chemotherapy: Secondary | ICD-10-CM | POA: Diagnosis not present

## 2019-05-13 LAB — CBC WITH DIFFERENTIAL (CANCER CENTER ONLY)
Abs Immature Granulocytes: 0.01 10*3/uL (ref 0.00–0.07)
Basophils Absolute: 0 10*3/uL (ref 0.0–0.1)
Basophils Relative: 1 %
Eosinophils Absolute: 0.1 10*3/uL (ref 0.0–0.5)
Eosinophils Relative: 2 %
HCT: 34.4 % — ABNORMAL LOW (ref 36.0–46.0)
Hemoglobin: 11.7 g/dL — ABNORMAL LOW (ref 12.0–15.0)
Immature Granulocytes: 0 %
Lymphocytes Relative: 31 %
Lymphs Abs: 1.3 10*3/uL (ref 0.7–4.0)
MCH: 37.5 pg — ABNORMAL HIGH (ref 26.0–34.0)
MCHC: 34 g/dL (ref 30.0–36.0)
MCV: 110.3 fL — ABNORMAL HIGH (ref 80.0–100.0)
Monocytes Absolute: 0.6 10*3/uL (ref 0.1–1.0)
Monocytes Relative: 14 %
Neutro Abs: 2.2 10*3/uL (ref 1.7–7.7)
Neutrophils Relative %: 52 %
Platelet Count: 254 10*3/uL (ref 150–400)
RBC: 3.12 MIL/uL — ABNORMAL LOW (ref 3.87–5.11)
RDW: 16.4 % — ABNORMAL HIGH (ref 11.5–15.5)
WBC Count: 4.1 10*3/uL (ref 4.0–10.5)
nRBC: 0 % (ref 0.0–0.2)

## 2019-05-13 LAB — CMP (CANCER CENTER ONLY)
ALT: 18 U/L (ref 0–44)
AST: 20 U/L (ref 15–41)
Albumin: 4.5 g/dL (ref 3.5–5.0)
Alkaline Phosphatase: 98 U/L (ref 38–126)
Anion gap: 10 (ref 5–15)
BUN: 15 mg/dL (ref 6–20)
CO2: 26 mmol/L (ref 22–32)
Calcium: 9 mg/dL (ref 8.9–10.3)
Chloride: 101 mmol/L (ref 98–111)
Creatinine: 0.84 mg/dL (ref 0.44–1.00)
GFR, Est AFR Am: >60 mL/min (ref >60)
GFR, Estimated: >60 mL/min (ref >60)
Glucose, Bld: 88 mg/dL (ref 70–99)
Potassium: 4 mmol/L (ref 3.5–5.1)
Sodium: 137 mmol/L (ref 135–145)
Total Bilirubin: 1 mg/dL (ref 0.3–1.2)
Total Protein: 7.4 g/dL (ref 6.5–8.1)

## 2019-05-13 LAB — CEA (IN HOUSE-CHCC): CEA (CHCC-In House): 17.84 ng/mL — ABNORMAL HIGH (ref 0.00–5.00)

## 2019-05-13 MED ORDER — SODIUM CHLORIDE (PF) 0.9 % IJ SOLN
INTRAMUSCULAR | Status: AC
  Filled 2019-05-13: qty 50

## 2019-05-13 MED ORDER — LORAZEPAM 0.5 MG PO TABS: ORAL_TABLET | ORAL | 0 refills | Status: DC

## 2019-05-13 MED ORDER — IOHEXOL 300 MG/ML  SOLN
100.0000 mL | Freq: Once | INTRAMUSCULAR | Status: AC | PRN
  Administered 2019-05-13: 100 mL via INTRAVENOUS

## 2019-05-13 MED ORDER — HEPARIN SOD (PORK) LOCK FLUSH 100 UNIT/ML IV SOLN
INTRAVENOUS | Status: AC
  Administered 2019-05-13: 500 [IU] via INTRAVENOUS
  Filled 2019-05-13: qty 5

## 2019-05-13 MED ORDER — HEPARIN SOD (PORK) LOCK FLUSH 100 UNIT/ML IV SOLN: 500.0000 [IU] | Freq: Once | INTRAVENOUS | Status: AC

## 2019-05-13 MED ORDER — AMPHETAMINE-DEXTROAMPHETAMINE 30 MG PO TABS: 30.0000 mg | ORAL_TABLET | Freq: Two times a day (BID) | ORAL | 0 refills | Status: DC

## 2019-05-13 MED ORDER — SODIUM CHLORIDE 0.9% FLUSH
10.0000 mL | Freq: Once | INTRAVENOUS | Status: AC
  Administered 2019-05-13: 10 mL
  Filled 2019-05-13: qty 10

## 2019-05-13 NOTE — Progress Notes (Signed)
[facility] OFFICE PROGRESS NOTE   Diagnosis: Small bowel carcinoma  INTERVAL HISTORY:   Hailey Houston returns as scheduled.  She feels well.  No abdominal pain.  Good appetite.  She continues Xeloda.  No mouth sores or diarrhea.  Objective:  Vital signs in last 24 hours:  Blood pressure 122/76, pulse 68, temperature 98 F (36.7 C), temperature source Temporal, resp. rate 16, height _0  (1.803 m), weight 239 lb 1.6 oz (108.5 kg), SpO2 98 %.    HEENT: No thrush or ulcer GI: No hepatomegaly, no mass, nontender Vascular: No leg edema  Skin: Erythema and dryness of the palms and soles  Portacath/PICC-without erythema  Lab Results:  Lab Results  Component Value Date   WBC 4.1 05/13/2019   HGB 11.7 (L) 05/13/2019   HCT 34.4 (L) 05/13/2019   MCV 110.3 (H) 05/13/2019   PLT 254 05/13/2019   NEUTROABS 2.2 05/13/2019    CMP  Lab Results  Component Value Date   NA 137 05/13/2019   K 4.0 05/13/2019   CL 101 05/13/2019   CO2 26 05/13/2019   GLUCOSE 88 05/13/2019   BUN 15 05/13/2019   CREATININE 0.84 05/13/2019   CALCIUM 9.0 05/13/2019   PROT 7.4 05/13/2019   ALBUMIN 4.5 05/13/2019   AST 20 05/13/2019   ALT 18 05/13/2019   ALKPHOS 98 05/13/2019   BILITOT 1.0 05/13/2019   GFRNONAA >60 05/13/2019   GFRAA >60 05/13/2019    Lab Results  Component Value Date   CEA1 17.84 (H) 05/13/2019      Imaging: CT images from 05/13/2019 reviewed Medications: I have reviewed the patient's current medications.   Assessment/Plan: 1.   Adenocarcinoma of the duodenum  CT abdomen/pelvis 06/16/2018-changes of pancreatitis, fullness at the head of the pancreas without a defined mass, multiple subcentimeter peripancreatic lymph nodes, distal common bile duct stent, multiple nodules at the lung bases  EUS 06/14/2018-ulcerated mucosa/mass at the distal duodenal bulb with extension to the pancreas head, portal adenopathy, biopsy of duodenal mass-adenocarcinoma;foundation  1-microsatellite stable, tumor mutational burden 1, KRASG12D mutation  ERCP 06/14/2018-distal duodenal bulb mass, 1 cm proximal to the ampulla causing biliary obstruction, status post placement of a metal common bile duct stent  CT chest 06/20/2018-bilateral pulmonary nodules consistent with metastatic disease, supraclavicular, mediastinal, and hilar lymphadenopathy, changes of pancreatitis  Ultrasound-guided biopsy of a left supraclavicular lymph node 06/22/2018-metastatic adenocarcinoma  Cycle 1 FOLFOX 07/03/2018  Cycle 2 FOLFOX 07/17/2018  Cycle 3 FOLFOX 08/06/2018, Neulasta added  Cycle 4 FOLFOX 08/21/2018  CTs 09/06/2018-numerous small lung nodules generally decreased in size; new rounded masslike subpleural opacity right upper lobe with some evidence of internal cavitation,measuring 4.0 cm. Minimal fat stranding about the pancreas and duodenum which is decreased. Discrete mass is not noted. Previously seen common bile duct stent absent. Gallbladder wall thickening with faintly calcified gallstones. No biliary ductal dilatation.  Cycle 5 FOLFOX 09/10/2018(oxaliplatin dose reduceddue to elevated liver enzymes)  Cycle 6 FOLFOX 09/24/2018(oxaliplatin further dose reduced due to elevated liver enzymes)  Cycle 7 FOLFOX 10/09/2018  Cycle 8 FOLFOX 10/22/2018  CTs 11/12/2018-continued improvement in lung metastases.  Maintenance Xeloda, daily dosing 11/25/2018  CTs 02/04/2019- decrease in bilateral lung nodules, many have resolved, no evidence of disease progression, no evidence of metastatic disease to the abdomen or pelvis  Xeloda continued  Xeloda placed on hold 02/26/2019 due to hand-foot syndrome, resumed 03/06/2019 at a dose of 1000 mg twice daily  05/13/2019-mixed response of (increased size of left upper lobe nodule,  decreased size of right lower lobe nodule), new "shotty "abdominal retroperitoneal lymph nodes, new tiny nodules in the left abdominal omentum  Elevated CEA  05/13/2019   2.Biliary obstruction secondary to the duodenal mass, status post placement of a bile duct stent 06/14/2018 3.Post ERCP pancreatitis 06/16/2018 4.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 Abdominal pain-secondary to the small bowel carcinoma versus pancreatitis  9. Rash-improved 10. Neutropenia secondary to chemotherapy 07/31/2018 11.Elevated transaminases;improved 09/07/2018; improved 09/10/2018. Question related to chemotherapy. 12.Oxaliplatin neuropathy    Disposition: Hailey Houston appears unchanged.  The CEA is higher and the restaging CTs are consistent with progression.  I reviewed the CT images with her.  We discussed treatment options.  The retroperitoneal lymph nodes and omental nodules are most likely indicative of disease progression given her history and elevated CEA.  She has been on maintenance Xeloda for the past 6 months.  She plans to take a vacation to the beach for the next month.  She will continue Xeloda and we will repeat the CEA when she returns on 06/17/2019.  If the CEA is again higher the plan is to discontinue Xeloda and change to a different systemic regimen.  Betsy Coder, MD  05/13/2019  4:41 PM

## 2019-05-15 ENCOUNTER — Telehealth: Payer: Self-pay | Admitting: Oncology

## 2019-05-15 ENCOUNTER — Other Ambulatory Visit: Payer: Self-pay | Admitting: *Deleted

## 2019-05-15 DIAGNOSIS — C170 Malignant neoplasm of duodenum: Secondary | ICD-10-CM

## 2019-05-15 MED ORDER — CAPECITABINE 500 MG PO TABS: ORAL_TABLET | ORAL | 0 refills | Status: DC

## 2019-05-15 NOTE — Telephone Encounter (Signed)
Scheduled per los. Called and left msg. Mailed printout  °

## 2019-05-24 ENCOUNTER — Inpatient Hospital Stay: Payer: 59 | Attending: Oncology | Admitting: General Practice

## 2019-05-24 ENCOUNTER — Telehealth: Payer: Self-pay | Admitting: *Deleted

## 2019-05-24 ENCOUNTER — Telehealth: Payer: Self-pay | Admitting: Oncology

## 2019-05-24 DIAGNOSIS — C170 Malignant neoplasm of duodenum: Secondary | ICD-10-CM

## 2019-05-24 NOTE — Progress Notes (Signed)
[facility] CSW Progress Notes  WebEx visit w patient for continued support in context of diagnosis of cancer of duodenum.  Patient at beach w sons and family for extended family time together.  Got results from recent scan - evidence of disease progression. This did not surprise patient as she had been feeling increasingly unwell (fatigue, GI discomfort, changes in urine color).  She is also remembering prediction that "she had 18 months."  Wants to face her situation with honesty.  Thinking through implications of resuming infusion chemo and wondering if she will be able to work.  Considering financial implications.  Discussed ways to gather information on legal and financial resources/options and deal with employment issues.  Also discussed ways to think through how to best parent in context of cancer, how to communicate with teenage sons.  Enjoying time at beach w family, encouraged to find ways to capture moments and remember them in future.   Will meet again in two weeks.  Would like to get more information from oncologist on implications of last scan, will request this via oncologists desk nurse.  Linked w [facility] for support in parenting in context of chronic life threatening illness.   Edwyna Shell, LCSW Clinical Social Worker Phone:  [phone]

## 2019-05-24 NOTE — Telephone Encounter (Signed)
Via CSW patient requesting telephone visit with Dr. Benay Spice to discuss her CT scan again in more detail. MD will call 1/11 at 0800.Left appointment on her VM.

## 2019-05-24 NOTE — Telephone Encounter (Signed)
Contacted patient to verify telephone visit for pre reg °

## 2019-05-27 ENCOUNTER — Inpatient Hospital Stay (HOSPITAL_BASED_OUTPATIENT_CLINIC_OR_DEPARTMENT_OTHER): Payer: 59 | Admitting: Oncology

## 2019-05-27 DIAGNOSIS — C170 Malignant neoplasm of duodenum: Secondary | ICD-10-CM

## 2019-05-27 NOTE — Progress Notes (Signed)
[facility] OFFICE VISIT PROGRESS NOTE  I connected with Salomon Mast on 05/27/19 at  8:00 AM EST by telephone and verified that I am speaking with the correct person using two identifiers.   I discussed the limitations, risks, security and privacy concerns of performing an evaluation and management service by telemedicine and the availability of in-person appointments. I also discussed with the patient that there may be a patient responsible charge related to this service. The patient expressed understanding and agreed to proceed.    Patient's location: Home Provider's location: Office    Diagnosis: Small bowel carcinoma  INTERVAL HISTORY:   [name] was seen for a telehealth visit.  She reports malaise.  Nausea is relieved with Compazine.  No emesis.  No other complaint.  Good appetite.  Objective:    Lab Results:  Lab Results  Component Value Date   WBC 4.1 05/13/2019   HGB 11.7 (L) 05/13/2019   HCT 34.4 (L) 05/13/2019   MCV 110.3 (H) 05/13/2019   PLT 254 05/13/2019   NEUTROABS 2.2 05/13/2019    Imaging: CT images from 05/13/2019-reviewed  Medications: I have reviewed the patient's current medications.  Assessment/Plan: 1. Adenocarcinoma of the duodenum  CT abdomen/pelvis 06/16/2018-changes of pancreatitis, fullness at the head of the pancreas without a defined mass, multiple subcentimeter peripancreatic lymph nodes, distal common bile duct stent, multiple nodules at the lung bases  EUS 06/14/2018-ulcerated mucosa/mass at the distal duodenal bulb with extension to the pancreas head, portal adenopathy, biopsy of duodenal mass-adenocarcinoma;foundation 1-microsatellite stable, tumor mutational burden 1, KRASG12D mutation  ERCP 06/14/2018-distal duodenal bulb mass, 1 cm proximal to the ampulla causing biliary obstruction, status post placement of a metal common bile duct stent  CT chest 06/20/2018-bilateral  pulmonary nodules consistent with metastatic disease, supraclavicular, mediastinal, and hilar lymphadenopathy, changes of pancreatitis  Ultrasound-guided biopsy of a left supraclavicular lymph node 06/22/2018-metastatic adenocarcinoma  Cycle 1 FOLFOX 07/03/2018  Cycle 2 FOLFOX 07/17/2018  Cycle 3 FOLFOX 08/06/2018, Neulasta added  Cycle 4 FOLFOX 08/21/2018  CTs 09/06/2018-numerous small lung nodules generally decreased in size; new rounded masslike subpleural opacity right upper lobe with some evidence of internal cavitation,measuring 4.0 cm. Minimal fat stranding about the pancreas and duodenum which is decreased. Discrete mass is not noted. Previously seen common bile duct stent absent. Gallbladder wall thickening with faintly calcified gallstones. No biliary ductal dilatation.  Cycle 5 FOLFOX 09/10/2018(oxaliplatin dose reduceddue to elevated liver enzymes)  Cycle 6 FOLFOX 09/24/2018(oxaliplatin further dose reduced due to elevated liver enzymes)  Cycle 7 FOLFOX 10/09/2018  Cycle 8 FOLFOX 10/22/2018  CTs 11/12/2018-continued improvement in lung metastases.  Maintenance Xeloda, daily dosing 11/25/2018  CTs 02/04/2019-decrease in bilateral lung nodules, many have resolved, no evidence of disease progression, no evidence of metastatic disease to the abdomen or pelvis  Xeloda continued  Xeloda placed on hold 02/26/2019 due to hand-foot syndrome, resumed 03/06/2019 at a dose of 1000 mg twice daily  05/13/2019-mixed response of (increased size of left upper lobe nodule, decreased size of right lower lobe nodule), new "shotty "abdominal retroperitoneal lymph nodes, new tiny nodules in the left abdominal omentum  Elevated CEA 05/13/2019  CTs 05/13/2019-increased size of the left upper lobe nodule, decreased right lower lobe nodule, new small retroperitoneal nodes, new subcentimeter nodules in the left omental fat  Xeloda continued   2.Biliary obstruction secondary to the duodenal  mass, status post placement of a bile duct stent 06/14/2018 3.Post ERCP pancreatitis 06/16/2018 4.Anemia secondary to phlebotomy  and multiple procedures 5.G2, P2 6.Nausea secondary to #1  Upper GI 06/27/2018-moderate proximal duodenal narrowing, no evidence of gastric outlet obstruction 7.Attention deficit disorder 8. Abdominal pain-secondary to the small bowel carcinoma versus pancreatitis  9. Rash-improved 10. Neutropenia secondary to chemotherapy 07/31/2018 11.Elevated transaminases;improved 09/07/2018; improved 09/10/2018. Question related to chemotherapy. 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  She is currently being treated with maintenance capecitabine.  She is tolerating the treatment well.  I discussed the restaging CT findings with her by telephone on 05/13/2019 and again today.  I reviewed the CT images.  There is evidence of minimal disease progression with new retroperitoneal lymph nodes and small omental nodules.  There is a mixed response in the lungs.  The CEA is higher.  She will continue capecitabine.  We will discuss additional treatment options when she returns for an office visit on 06/17/2019.  We will plan to change to FOLFIRI when there is more significant evidence of disease progression.   I discussed the assessment and treatment plan with the patient. The patient was provided an opportunity to ask questions and all were answered. The patient agreed with the plan and demonstrated an understanding of the instructions.   The patient was advised to call back or seek an in-person evaluation if the symptoms worsen or if the condition fails to improve as anticipated.  I provided 15 minutes of telephone, chart review, and documentation time during this encounter, and > 50% was spent counseling as documented under my assessment & plan.  Betsy Coder ANP/GNP-BC   05/27/2019 8:20 AM

## 2019-05-28 ENCOUNTER — Other Ambulatory Visit: Payer: Self-pay | Admitting: Oncology

## 2019-06-07 ENCOUNTER — Inpatient Hospital Stay: Payer: 59 | Admitting: General Practice

## 2019-06-07 DIAGNOSIS — C170 Malignant neoplasm of duodenum: Secondary | ICD-10-CM

## 2019-06-07 NOTE — Progress Notes (Signed)
[facility] CSW Progress Notes  Met w patient for supportive counseling via WebEx.  Appreciated visit w Dr Benay Spice to review her most recent scan, appreciated the time spent to more fully understand scan and its implications for treatment.  As result, has had a wonderful time at [employer] w sons and sister/mother.  Discussed ways to preserve energy through offloading tasks to others when possible, simplifying routine tasks, decreasing unneeded items at home.  Has seen that simplifying life has meant more time for self care and time with family.  Encouraged continued work on "right sizing" lifestyle to align with values and energy as she negotiates life with cancer.  Edwyna Shell, LCSW Clinical Social Worker Phone:  [phone] Cell:  [phone]

## 2019-06-17 ENCOUNTER — Other Ambulatory Visit: Payer: Self-pay

## 2019-06-17 ENCOUNTER — Inpatient Hospital Stay: Payer: 59

## 2019-06-17 ENCOUNTER — Inpatient Hospital Stay: Payer: 59 | Attending: Oncology | Admitting: Nurse Practitioner

## 2019-06-17 ENCOUNTER — Encounter: Payer: Self-pay | Admitting: Nurse Practitioner

## 2019-06-17 VITALS — BP 123/69 | HR 84 | Temp 97.8°F | Resp 18 | Ht 71.0 in | Wt 238.8 lb

## 2019-06-17 DIAGNOSIS — K8021 Calculus of gallbladder without cholecystitis with obstruction: Secondary | ICD-10-CM | POA: Insufficient documentation

## 2019-06-17 DIAGNOSIS — D701 Agranulocytosis secondary to cancer chemotherapy: Secondary | ICD-10-CM | POA: Insufficient documentation

## 2019-06-17 DIAGNOSIS — Z452 Encounter for adjustment and management of vascular access device: Secondary | ICD-10-CM | POA: Insufficient documentation

## 2019-06-17 DIAGNOSIS — C170 Malignant neoplasm of duodenum: Secondary | ICD-10-CM | POA: Diagnosis present

## 2019-06-17 DIAGNOSIS — R197 Diarrhea, unspecified: Secondary | ICD-10-CM | POA: Diagnosis not present

## 2019-06-17 DIAGNOSIS — Z95828 Presence of other vascular implants and grafts: Secondary | ICD-10-CM

## 2019-06-17 DIAGNOSIS — G620 Drug-induced polyneuropathy: Secondary | ICD-10-CM | POA: Diagnosis not present

## 2019-06-17 DIAGNOSIS — C7800 Secondary malignant neoplasm of unspecified lung: Secondary | ICD-10-CM | POA: Insufficient documentation

## 2019-06-17 DIAGNOSIS — D649 Anemia, unspecified: Secondary | ICD-10-CM | POA: Diagnosis not present

## 2019-06-17 LAB — CMP (CANCER CENTER ONLY)
ALT: 25 U/L (ref 0–44)
AST: 23 U/L (ref 15–41)
Albumin: 4.5 g/dL (ref 3.5–5.0)
Alkaline Phosphatase: 101 U/L (ref 38–126)
Anion gap: 10 (ref 5–15)
BUN: 16 mg/dL (ref 6–20)
CO2: 26 mmol/L (ref 22–32)
Calcium: 9.3 mg/dL (ref 8.9–10.3)
Chloride: 104 mmol/L (ref 98–111)
Creatinine: 0.71 mg/dL (ref 0.44–1.00)
GFR, Est AFR Am: >60 mL/min (ref >60)
GFR, Estimated: >60 mL/min (ref >60)
Glucose, Bld: 99 mg/dL (ref 70–99)
Potassium: 3.8 mmol/L (ref 3.5–5.1)
Sodium: 140 mmol/L (ref 135–145)
Total Bilirubin: 1.3 mg/dL — ABNORMAL HIGH (ref 0.3–1.2)
Total Protein: 7.4 g/dL (ref 6.5–8.1)

## 2019-06-17 LAB — CBC WITH DIFFERENTIAL (CANCER CENTER ONLY)
Abs Immature Granulocytes: 0.01 10*3/uL (ref 0.00–0.07)
Basophils Absolute: 0 10*3/uL (ref 0.0–0.1)
Basophils Relative: 0 %
Eosinophils Absolute: 0 10*3/uL (ref 0.0–0.5)
Eosinophils Relative: 0 %
HCT: 34.1 % — ABNORMAL LOW (ref 36.0–46.0)
Hemoglobin: 11.9 g/dL — ABNORMAL LOW (ref 12.0–15.0)
Immature Granulocytes: 0 %
Lymphocytes Relative: 21 %
Lymphs Abs: 1.1 10*3/uL (ref 0.7–4.0)
MCH: 38.9 pg — ABNORMAL HIGH (ref 26.0–34.0)
MCHC: 34.9 g/dL (ref 30.0–36.0)
MCV: 111.4 fL — ABNORMAL HIGH (ref 80.0–100.0)
Monocytes Absolute: 0.5 10*3/uL (ref 0.1–1.0)
Monocytes Relative: 10 %
Neutro Abs: 3.5 10*3/uL (ref 1.7–7.7)
Neutrophils Relative %: 69 %
Platelet Count: 223 10*3/uL (ref 150–400)
RBC: 3.06 MIL/uL — ABNORMAL LOW (ref 3.87–5.11)
RDW: 16.2 % — ABNORMAL HIGH (ref 11.5–15.5)
WBC Count: 5.1 10*3/uL (ref 4.0–10.5)
nRBC: 0 % (ref 0.0–0.2)

## 2019-06-17 MED ORDER — HEPARIN SOD (PORK) LOCK FLUSH 100 UNIT/ML IV SOLN
500.0000 [IU] | Freq: Once | INTRAVENOUS | Status: AC
  Administered 2019-06-17: 15:00:00 500 [IU]
  Filled 2019-06-17: qty 5

## 2019-06-17 MED ORDER — SODIUM CHLORIDE 0.9% FLUSH
10.0000 mL | Freq: Once | INTRAVENOUS | Status: AC
  Administered 2019-06-17: 10 mL
  Filled 2019-06-17: qty 10

## 2019-06-17 NOTE — Patient Instructions (Signed)

## 2019-06-17 NOTE — Progress Notes (Addendum)
[facility] OFFICE PROGRESS NOTE   Diagnosis: Small bowel carcinoma  INTERVAL HISTORY:   Hailey Houston returns as scheduled.  She continues Xeloda.  She reports having diarrhea since yesterday.  She estimates 4-6 stools so far.  She has had intermittent upper abdominal pain since yesterday.  She does not typically have diarrhea with Xeloda.  Some nausea.  No vomiting.  No mouth sores.  She notes hands and feet are red but not painful.  No fever, cough, shortness of breath.  Objective:  Vital signs in last 24 hours:  Blood pressure 123/69, pulse 84, temperature 97.8 F (36.6 C), temperature source Temporal, resp. rate 18, height 5' 11"  (1.803 m), weight 238 lb 12.8 oz (108.3 kg), SpO2 97 %.    HEENT: No thrush or ulcers. GI: Abdomen soft.  Mild tenderness at the upper abdomen.  No hepatomegaly.  No apparent ascites. Vascular: No leg edema. Skin: Palms with mild erythema. Port-A-Cath without erythema.   Lab Results:  Lab Results  Component Value Date   WBC 5.1 06/17/2019   HGB 11.9 (L) 06/17/2019   HCT 34.1 (L) 06/17/2019   MCV 111.4 (H) 06/17/2019   PLT 223 06/17/2019   NEUTROABS 3.5 06/17/2019    Imaging:  No results found.  Medications: I have reviewed the patient's current medications.  Assessment/Plan: 1. Adenocarcinoma of the duodenum  CT abdomen/pelvis 06/16/2018-changes of pancreatitis, fullness at the head of the pancreas without a defined mass, multiple subcentimeter peripancreatic lymph nodes, distal common bile duct stent, multiple nodules at the lung bases  EUS 06/14/2018-ulcerated mucosa/mass at the distal duodenal bulb with extension to the pancreas head, portal adenopathy, biopsy of duodenal mass-adenocarcinoma;foundation 1-microsatellite stable, tumor mutational burden 1, KRASG12D mutation  ERCP 06/14/2018-distal duodenal bulb mass, 1 cm proximal to the ampulla causing biliary obstruction, status post placement of a metal common bile duct  stent  CT chest 06/20/2018-bilateral pulmonary nodules consistent with metastatic disease, supraclavicular, mediastinal, and hilar lymphadenopathy, changes of pancreatitis  Ultrasound-guided biopsy of a left supraclavicular lymph node 06/22/2018-metastatic adenocarcinoma  Cycle 1 FOLFOX 07/03/2018  Cycle 2 FOLFOX 07/17/2018  Cycle 3 FOLFOX 08/06/2018, Neulasta added  Cycle 4 FOLFOX 08/21/2018  CTs 09/06/2018-numerous small lung nodules generally decreased in size; new rounded masslike subpleural opacity right upper lobe with some evidence of internal cavitation,measuring 4.0 cm. Minimal fat stranding about the pancreas and duodenum which is decreased. Discrete mass is not noted. Previously seen common bile duct stent absent. Gallbladder wall thickening with faintly calcified gallstones. No biliary ductal dilatation.  Cycle 5 FOLFOX 09/10/2018(oxaliplatin dose reduceddue to elevated liver enzymes)  Cycle 6 FOLFOX 09/24/2018(oxaliplatin further dose reduced due to elevated liver enzymes)  Cycle 7 FOLFOX 10/09/2018  Cycle 8 FOLFOX 10/22/2018  CTs 11/12/2018-continued improvement in lung metastases.  Maintenance Xeloda, daily dosing 11/25/2018  CTs 02/04/2019-decrease in bilateral lung nodules, many have resolved, no evidence of disease progression, no evidence of metastatic disease to the abdomen or pelvis  Xeloda continued  Xeloda placed on hold 02/26/2019 due to hand-foot syndrome, resumed 10/21/2020at a dose of 1000 mg twice daily  05/13/2019-mixed response of (increased size of left upper lobe nodule, decreased size of right lower lobe nodule), new "shotty "abdominal retroperitoneal lymph nodes, new tiny nodules in the left abdominal omentum  Elevated CEA 05/13/2019  CTs 05/13/2019-increased size of the left upper lobe nodule, decreased right lower lobe nodule, new small retroperitoneal nodes, new subcentimeter nodules in the left omental fat  Xeloda continued   2.Biliary  obstruction secondary to  the duodenal mass, status post placement of a bile duct stent 06/14/2018 3.Post ERCP pancreatitis 06/16/2018 4.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 Abdominal pain-secondary to the small bowel carcinoma versus pancreatitis  9. Rash-improved 10. Neutropenia secondary to chemotherapy 07/31/2018 11.Elevated transaminases;improved 09/07/2018; improved 09/10/2018. Question related to chemotherapy. 12.Oxaliplatin neuropathy   Disposition: Hailey Houston appears unchanged.  There is no clinical evidence of disease progression.  Dr. Benay Spice recommends she continue Xeloda.  Plan for restaging CTs in approximately 6 to 8 weeks.  She is having diarrhea.  This may be related to Xeloda.  She will temporarily place Xeloda on hold.  She will begin Imodium and push fluids.  She will resume Xeloda once the diarrhea has resolved.  She understands to contact the office if the diarrhea persists.  She will return for lab, Port-A-Cath flush and follow-up in 4 weeks.  She will contact the office in the interim as outlined above or with any other problems.  Patient seen with Dr. Benay Spice.   Ned Card ANP/GNP-BC   06/17/2019  3:22 PM This was a shared visit with Ned Card.  Hailey Houston will place Xeloda on hold until the diarrhea resolves.  She will call for recurrent diarrhea.  The plan is to continue Xeloda for now.  Systemic therapy will be changed to FOLFIRI if there is disease progression on the next restaging CTs.  Hailey Manson, MD

## 2019-06-18 LAB — CEA (IN HOUSE-CHCC): CEA (CHCC-In House): 57.73 ng/mL — ABNORMAL HIGH (ref 0.00–5.00)

## 2019-06-19 ENCOUNTER — Telehealth: Payer: Self-pay | Admitting: Oncology

## 2019-06-19 NOTE — Telephone Encounter (Signed)
Scheduled per los. Called and spoke with patient. Confirmed appt 

## 2019-06-21 ENCOUNTER — Inpatient Hospital Stay: Payer: 59 | Admitting: General Practice

## 2019-06-21 ENCOUNTER — Other Ambulatory Visit: Payer: Self-pay | Admitting: *Deleted

## 2019-06-21 ENCOUNTER — Encounter: Payer: Self-pay | Admitting: General Practice

## 2019-06-21 DIAGNOSIS — C170 Malignant neoplasm of duodenum: Secondary | ICD-10-CM

## 2019-06-21 MED ORDER — CAPECITABINE 500 MG PO TABS: ORAL_TABLET | ORAL | 0 refills | Status: DC

## 2019-06-21 NOTE — Progress Notes (Signed)
[facility] CSW Progress Notes  Referral submitted to [facility] on patient behalf.  Edwyna Shell, LCSW Clinical Social Worker Phone:  [phone] Cell:  [phone]

## 2019-06-25 ENCOUNTER — Telehealth: Payer: Self-pay | Admitting: *Deleted

## 2019-06-25 NOTE — Telephone Encounter (Signed)
Calling back to report her diarrhea is not much better. Still not feeling well and report feeling worse than at last visit. Xeloda still on hold. Tends to feel worse at night along with abdominal pain. Also needs refills on her Adderall and lorazepam.

## 2019-06-26 ENCOUNTER — Telehealth: Payer: Self-pay

## 2019-06-26 NOTE — Telephone Encounter (Signed)
TC to pt per Ned Card NP to let her know to Keep Xeloda on hold. Also let her know that Lattie Haw wanted to see her on Friday, Dr Benay Spice has opening at 5 am and Lattie Haw has one at 2:45. She picked 2:45p. Appointment being put in now.

## 2019-06-28 ENCOUNTER — Inpatient Hospital Stay (HOSPITAL_BASED_OUTPATIENT_CLINIC_OR_DEPARTMENT_OTHER): Payer: 59 | Admitting: Nurse Practitioner

## 2019-06-28 ENCOUNTER — Other Ambulatory Visit: Payer: Self-pay | Admitting: Oncology

## 2019-06-28 ENCOUNTER — Other Ambulatory Visit: Payer: Self-pay

## 2019-06-28 ENCOUNTER — Encounter: Payer: Self-pay | Admitting: Nurse Practitioner

## 2019-06-28 VITALS — BP 133/87 | HR 84 | Temp 98.2°F | Resp 20 | Ht 71.0 in | Wt 234.9 lb

## 2019-06-28 DIAGNOSIS — C170 Malignant neoplasm of duodenum: Secondary | ICD-10-CM | POA: Diagnosis not present

## 2019-06-28 MED ORDER — LORAZEPAM 0.5 MG PO TABS: ORAL_TABLET | ORAL | 0 refills | Status: DC

## 2019-06-28 NOTE — Addendum Note (Signed)
Addended by: Owens Shark on: 06/28/2019 04:34 PM   Modules accepted: Orders

## 2019-06-28 NOTE — Progress Notes (Addendum)
[name]   Diagnosis: Small bowel carcinoma  INTERVAL HISTORY:   Hailey Houston returns as scheduled.  Xeloda was placed on hold 06/17/2019 due to diarrhea.  The plan was for her to resume Xeloda once the diarrhea improved.  She reports resuming Xeloda around 06/21/2019.  She subsequently developed diarrhea and nausea and stopped the Xeloda 4 or 5 days later. The diarrhea is better. No mouth sores though she notes her tongue is "irritated".  No hand or foot pain or redness.  She notes recent upper abdominal and right lower abdomen pain after eating.  Objective:  Vital signs in last 24 hours:  Blood pressure 133/87, pulse 84, temperature 98.2 F (36.8 C), temperature source Temporal, resp. rate 20, height _0  (1.803 m), weight 234 lb 14.4 oz (106.5 kg), SpO2 99 %.    HEENT: No thrush or ulcers. GI: Abdomen soft.  No hepatomegaly.  Tender at the epigastric region and lower abdomen. Vascular: No leg edema.  Skin: Palms without erythema. Port-A-Cath without erythema.   Lab Results:  Lab Results  Component Value Date   WBC 5.1 06/17/2019   HGB 11.9 (L) 06/17/2019   HCT 34.1 (L) 06/17/2019   MCV 111.4 (H) 06/17/2019   PLT 223 06/17/2019   NEUTROABS 3.5 06/17/2019    Imaging:  No results found.  Medications: I have reviewed the patient's current medications.  Assessment/Plan: 1.Adenocarcinoma of the duodenum  CT abdomen/pelvis 06/16/2018-changes of pancreatitis, fullness at the head of the pancreas without a defined mass, multiple subcentimeter peripancreatic lymph nodes, distal common bile duct stent, multiple nodules at the lung bases  EUS 06/14/2018-ulcerated mucosa/mass at the distal duodenal bulb with extension to the pancreas head, portal adenopathy, biopsy of duodenal mass-adenocarcinoma;foundation 1-microsatellite stable, tumor mutational burden 1, KRASG12D mutation  ERCP 06/14/2018-distal duodenal bulb mass, 1 cm proximal to the  ampulla causing biliary obstruction, status post placement of a metal common bile duct stent  CT chest 06/20/2018-bilateral pulmonary nodules consistent with metastatic disease, supraclavicular, mediastinal, and hilar lymphadenopathy, changes of pancreatitis  Ultrasound-guided biopsy of a left supraclavicular lymph node 06/22/2018-metastatic adenocarcinoma  Cycle 1 FOLFOX 07/03/2018  Cycle 2 FOLFOX 07/17/2018  Cycle 3 FOLFOX 08/06/2018, Neulasta added  Cycle 4 FOLFOX 08/21/2018  CTs 09/06/2018-numerous small lung nodules generally decreased in size; new rounded masslike subpleural opacity right upper lobe with some evidence of internal cavitation,measuring 4.0 cm. Minimal fat stranding about the pancreas and duodenum which is decreased. Discrete mass is not noted. Previously seen common bile duct stent absent. Gallbladder wall thickening with faintly calcified gallstones. No biliary ductal dilatation.  Cycle 5 FOLFOX 09/10/2018(oxaliplatin dose reduceddue to elevated liver enzymes)  Cycle 6 FOLFOX 09/24/2018(oxaliplatin further dose reduced due to elevated liver enzymes)  Cycle 7 FOLFOX 10/09/2018  Cycle 8 FOLFOX 10/22/2018  CTs 11/12/2018-continued improvement in lung metastases.  Maintenance Xeloda, daily dosing 11/25/2018  CTs 02/04/2019-decrease in bilateral lung nodules, many have resolved, no evidence of disease progression, no evidence of metastatic disease to the abdomen or pelvis  Xeloda continued  Xeloda placed on hold 02/26/2019 due to hand-foot syndrome, resumed 10/21/2020at a dose of 1000 mg twice daily  05/13/2019-mixed response of (increased size of left upper lobe nodule, decreased size of right lower lobe nodule), new "shotty "abdominal retroperitoneal lymph nodes, new tiny nodules in the left abdominal omentum  Elevated CEA 05/13/2019  CTs12/28/2020-increased size of the left upper lobe nodule, decreased right lower lobe nodule, new small retroperitoneal nodes,  new subcentimeter nodules in the  left omental fat  Xeloda continued  Xeloda placed on hold 06/28/2019 2.Biliary obstruction secondary to the duodenal mass, status post placement of a bile duct stent 06/14/2018 3.Post ERCP pancreatitis 06/16/2018 4.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 Abdominal pain-secondary to the small bowel carcinoma versus pancreatitis  9. Rash-improved 10. Neutropenia secondary to chemotherapy 07/31/2018 11.Elevated transaminases;improved 09/07/2018; improved 09/10/2018. Question related to chemotherapy. 12.Oxaliplatin neuropathy  Disposition: Hailey Houston has metastatic small bowel cancer.  She has been on maintenance Xeloda since July 2020.  She has recently been experiencing diarrhea, possibly related to Xeloda.  She is having increased upper abdominal pain and the CEA from earlier this month was higher.  Dr. Benay Spice recommends placing Xeloda on hold.  We are referring her for restaging CTs in the next 1 to 2 weeks.  We had a preliminary discussion regarding the FOLFIRI regimen.  We reviewed potential toxicities including bone marrow toxicity, hair loss, diarrhea.  We will discuss further pending the CT results.  She will return for a follow-up visit 07/10/2019.  She will contact the office in the interim with any problems.  Patient seen with Dr. Benay Spice.    Ned Card ANP/GNP-BC   06/28/2019  3:06 PM This was a shared visit with Ned Card.  Hailey Houston has symptoms suggestive of disease progression.  The CEA is higher.  She will be referred for restaging CTs.  The plan is to begin FOLFIRI if the CTs confirmed disease progression.  Julieanne Manson, the

## 2019-07-01 ENCOUNTER — Ambulatory Visit (INDEPENDENT_AMBULATORY_CARE_PROVIDER_SITE_OTHER): Payer: 59 | Admitting: Psychiatry

## 2019-07-01 ENCOUNTER — Encounter: Payer: Self-pay | Admitting: Psychiatry

## 2019-07-01 ENCOUNTER — Other Ambulatory Visit: Payer: Self-pay

## 2019-07-01 DIAGNOSIS — F900 Attention-deficit hyperactivity disorder, predominantly inattentive type: Secondary | ICD-10-CM

## 2019-07-01 DIAGNOSIS — F5105 Insomnia due to other mental disorder: Secondary | ICD-10-CM

## 2019-07-01 DIAGNOSIS — F4322 Adjustment disorder with anxiety: Secondary | ICD-10-CM | POA: Diagnosis not present

## 2019-07-01 MED ORDER — AMPHETAMINE-DEXTROAMPHETAMINE 30 MG PO TABS: 30.0000 mg | ORAL_TABLET | Freq: Two times a day (BID) | ORAL | 0 refills | Status: AC

## 2019-07-01 MED ORDER — ZALEPLON 10 MG PO CAPS: 10.0000 mg | ORAL_CAPSULE | Freq: Every evening | ORAL | 2 refills | Status: AC | PRN

## 2019-07-01 MED ORDER — AMPHETAMINE-DEXTROAMPHETAMINE 30 MG PO TABS: 30.0000 mg | ORAL_TABLET | Freq: Every day | ORAL | 0 refills | Status: DC

## 2019-07-01 NOTE — Progress Notes (Signed)
DESTANEE CIRRINCIONE BJ:[id number] 1970-10-09 49 y.o.  Subjective:   Patient ID:  Hailey Houston is a 49 y.o. (DOB 1970-08-14) female.  Chief Complaint:  Chief Complaint  Patient presents with  . Follow-up    Medication Management  . Anxiety    Medication Management  . ADD  . Sleeping Problem    HPI Sharryl Bosh presents to the office today for follow-up of ADHD.  Remote history of atypical depression.  Last seen January 25, 2018 on Adderall 30 mg twice daily and no meds were changed.  DX stage 4 cancer Jan 2020 Gi tract and spread to lungs and abdomen.  Physically variable.  Not feeling great with oral chemo.   Oncologist was writing Adderall and told her she needed to come back here.  Was originally told prognosis of 1 1/2 year but she's holding her own.  She has continued Adderall most days.  Still working FT.  So needs the Adderall. Tolerating it well.   He started Lexapro 5 for a couple of months for anxiety bc worrying about the future.  She thinks it helps some. Problems with sleep bc can go to sleep but then awakens worrying over a serious chronic disease that is not going to get any better.  2-3 times per week it happens awaken wide awake and hard to go back to sleep.  Does not take Ativan at night.  No sleep meds tried.   No time for worry in the day.  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   No sig caffeine. To bed 10 pm.  Weight stable and appetite is OK usually unless pain and nausea.    Single parent.  Micha 48, Charlie 60 yo.  Doing as well as possible.  Past Psychiatric Medication Trials: Ritalin in high school and college, Vyvanse, Adderall, Adderall XR Last antidepressant Effexor XR 225 and 2013, duloxetine 60, Lexapro 20   Review of Systems:  Review of Systems  Gastrointestinal: Positive for abdominal pain and nausea.  Neurological: Negative for tremors and weakness.    Medications: I have reviewed the patient's current medications.  Current Outpatient Medications   Medication Sig Dispense Refill  . amphetamine-dextroamphetamine (ADDERALL) 30 MG tablet Take 1 tablet by mouth 2 (two) times daily. 60 tablet 0  . capecitabine (XELODA) 500 MG tablet Take #2 tabs (1000mg ) twice daily continuous with no break 120 tablet 0  . cetirizine (ZYRTEC) 10 MG tablet Take 10 mg by mouth daily.    Marland Kitchen escitalopram (LEXAPRO) 5 MG tablet TAKE 1 TABLET BY MOUTH EVERY DAY 90 tablet 0  . famotidine ([employer] ACID CONTROLLER MAX ST) 20 MG tablet TAKE 1 TABLET BY MOUTH TWICE A DAY *INS PREFERS OTC* 50 tablet 2  . HYDROmorphone (DILAUDID) 4 MG tablet Take 1 tablet (4 mg total) by mouth every 8 (eight) hours as needed for severe pain. 60 tablet 0  . LORazepam (ATIVAN) 0.5 MG tablet TAKE 1 TABLET EVERY 6 HOURS AS NEEDED FOR ANXIETY OR NAUSEA 60 tablet 0  . ondansetron (ZOFRAN) 8 MG tablet Take 1 tablet every 8 hours as needed for nausea. 30 tablet 1  . pantoprazole (PROTONIX) 40 MG tablet TAKE 1 TABLET BY MOUTH TWICE A DAY 180 tablet 0  . Polyethylene Glycol 3350 (MIRALAX PO) Take 17 g by mouth daily as needed.     . prochlorperazine (COMPAZINE) 10 MG tablet TAKE 1 TABLET BY MOUTH EVERY 6 HOURS AS NEEDED FOR NAUSEA AND VOMITING 60 tablet 1   No current  facility-administered medications for this visit.   Facility-Administered Medications Ordered in Other Visits  Medication Dose Route Frequency Provider Last Rate Last Admin  . sodium chloride flush (NS) 0.9 % injection 10 mL  10 mL Intravenous PRN Ladell Pier, MD   10 mL at 07/02/18 0915  . sodium chloride flush (NS) 0.9 % injection 10 mL  10 mL Intravenous PRN Ladell Pier, MD   10 mL at 07/02/18 0916    Medication Side Effects: None  Allergies:  Allergies  Allergen Reactions  . Oxycodone Itching    Past Medical History:  Diagnosis Date  . ADD (attention deficit disorder)   . 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 Anxiety   . Family history of adverse reaction to anesthesia    mom goes into afib  . Wears  glasses    driving    Family History  Problem Relation Age of Onset  . Heart disease Mother   . Heart disease Father     Social History   Socioeconomic History  . Marital status: Divorced    Spouse name: Not on file  . Number of children: Not on file  . Years of education: Not on file  . Highest education level: Not on file  Occupational History  . Not on file  Tobacco Use  . Smoking status: Current Some Day Smoker  . Smokeless tobacco: Never Used  Substance and Sexual Activity  . Alcohol use: Yes    Comment: occ  . Drug use: No  . Sexual activity: Not Currently    Comment: a pk/week  Other Topics Concern  . Not on file  Social History Narrative  . Not on file   Social Determinants of Health   Financial Resource Strain:   . Difficulty of Paying Living Expenses: Not on file  Food Insecurity:   . Worried About Charity fundraiser in the Last Year: Not on file  . Ran Out of Food in the Last Year: Not on file  Transportation Needs:   . Lack of Transportation (Medical): Not on file  . Lack of Transportation (Non-Medical): Not on file  Physical Activity:   . Days of Exercise per Week: Not on file  . Minutes of Exercise per Session: Not on file  Stress:   . Feeling of Stress : Not on file  Social Connections:   . Frequency of Communication with Friends and Family: Not on file  . Frequency of Social Gatherings with Friends and Family: Not on file  . Attends Religious Services: Not on file  . Active Member of Clubs or Organizations: Not on file  . Attends Archivist Meetings: Not on file  . Marital Status: Not on file  Intimate Partner Violence:   . Fear of Current or Ex-Partner: Not on file  . Emotionally Abused: Not on file  . Physically Abused: Not on file  . Sexually Abused: Not on file    Past Medical History, Surgical history, Social history, and Family history were reviewed and updated as appropriate.   Please see review of systems for further  details on the patient's review from today.   Objective:   Physical Exam:  There were no vitals taken for this visit.  Physical Exam Constitutional:      General: She is not in acute distress.    Appearance: She is well-developed.  Musculoskeletal:        General: No deformity.  Neurological:     Mental Status: She  is alert and oriented to person, place, and time.     Coordination: Coordination normal.  Psychiatric:        Attention and Perception: Attention and perception normal. She does not perceive auditory or visual hallucinations.        Mood and Affect: Mood normal. Mood is not anxious or depressed. Affect is not labile, blunt, angry or inappropriate.        Speech: Speech normal.        Behavior: Behavior normal.        Thought Content: Thought content normal. Thought content is not paranoid or delusional. Thought content does not include homicidal or suicidal ideation. Thought content does not include homicidal or suicidal plan.        Cognition and Memory: Cognition and memory normal.        Judgment: Judgment normal.     Comments: Insight intact     Lab Review:     Component Value Date/Time   NA 140 06/17/2019 1500   K 3.8 06/17/2019 1500   CL 104 06/17/2019 1500   CO2 26 06/17/2019 1500   GLUCOSE 99 06/17/2019 1500   BUN 16 06/17/2019 1500   CREATININE 0.71 06/17/2019 1500   CALCIUM 9.3 06/17/2019 1500   PROT 7.4 06/17/2019 1500   ALBUMIN 4.5 06/17/2019 1500   AST 23 06/17/2019 1500   ALT 25 06/17/2019 1500   ALKPHOS 101 06/17/2019 1500   BILITOT 1.3 (H) 06/17/2019 1500   GFRNONAA >60 06/17/2019 1500   GFRAA >60 06/17/2019 1500       Component Value Date/Time   WBC 5.1 06/17/2019 1500   WBC 6.3 07/06/2018 1050   RBC 3.06 (L) 06/17/2019 1500   HGB 11.9 (L) 06/17/2019 1500   HCT 34.1 (L) 06/17/2019 1500   PLT 223 06/17/2019 1500   MCV 111.4 (H) 06/17/2019 1500   MCH 38.9 (H) 06/17/2019 1500   MCHC 34.9 06/17/2019 1500   RDW 16.2 (H) 06/17/2019  1500   LYMPHSABS 1.1 06/17/2019 1500   MONOABS 0.5 06/17/2019 1500   EOSABS 0.0 06/17/2019 1500   BASOSABS 0.0 06/17/2019 1500    No results found for: POCLITH, LITHIUM   No results found for: PHENYTOIN, PHENOBARB, VALPROATE, CBMZ   .res Assessment: Plan:    Valori was seen today for follow-up, anxiety, add and sleeping problem.  Diagnoses and all orders for this visit:  Attention deficit hyperactivity disorder (ADHD), predominantly inattentive type  Adjustment disorder with anxious mood  Insomnia due to mental condition  Greater than 50% of 30 min face to face time with patient was spent on counseling and coordination of care. We discussed This is a return patient not seen since September 2019.  Unfortunately she has developed cancer with metastases in her abdomen since last seen.  She has added Lexapro 5 mg daily for anxiety and she feels it somewhat helpful.  Her daytime anxiety is pretty well controlled.  Her ADD is well controlled with the Adderall.  However she is having insomnia 2-3 times per week and will wake up with worry about the future.  Disc options for sleep.  Including something every night such as trazodone or perhaps a benzodiazepine given the level of anxiety at night.  However it is only happening 2-3 nights per week and its early morning awakening with time to take Sonata and it still wear off in the morning.  That seems to be the optimal option.  Discussed the option of chewing the Sonata up to  get it to work faster.  This is off label.  Discussed side effects including amnesia and hangover effects.  If it fails to work due to her level of anxiety that is a possibility, then call us back  Sonata 10 mg HS.  Continue Adderall 30 BID.  FU 6 mos  Lynder Parents, MD, DFAPA   Please see After Visit Summary for patient specific instructions.  Future Appointments  Date Time Provider [place]  07/05/2019 12:00 PM Beverely Pace, [place] [facility] None   07/15/2019  8:45 AM [facility] None  07/15/2019  9:00 AM [facility] None  07/15/2019  9:30 AM Ladell Pier, MD [facility] None    No orders of the defined types were placed in this encounter.   -------------------------------

## 2019-07-05 ENCOUNTER — Inpatient Hospital Stay: Payer: 59 | Admitting: General Practice

## 2019-07-05 NOTE — Progress Notes (Signed)
[facility] CSW Progress Notes  WebEx meeting with patient for support.  Discussed understandable concerns re recent seemingly sudden deaths of famous people from cancer.  This has raised concerns in patient's mind about her own prognosis - "will I die suddenly like they did?  Will I know that I am about to die?"  Discussed the role of triggering events for those diagnosed w cancer and the unpredictable nature of her disease.  Encouraged frank conversation w oncologist.  Also explored areas of concern, things left "undone."  Will work on getting list of passwords/logins and put in safe place for fami;y to access.  Also wants to work on [employer] for sons.  Recommended book In the Checklist of Life as way to organize practical details, will also provide information on "ethical will" concept.  Discussed ways to begin to organize photos/memories for sons.  Goal is to organize passwords first.    Edwyna Shell, [place] Worker Phone:  [phone] Cell:  [phone]

## 2019-07-09 ENCOUNTER — Ambulatory Visit (HOSPITAL_COMMUNITY)
Admission: RE | Admit: 2019-07-09 | Discharge: 2019-07-09 | Disposition: A | Discharge status: Home / Self Care | Payer: 59 | Source: Ambulatory Visit | Attending: Nurse Practitioner | Admitting: Nurse Practitioner

## 2019-07-09 ENCOUNTER — Other Ambulatory Visit: Payer: Self-pay | Admitting: Nurse Practitioner

## 2019-07-09 ENCOUNTER — Inpatient Hospital Stay: Payer: 59

## 2019-07-09 ENCOUNTER — Encounter (HOSPITAL_COMMUNITY): Payer: Self-pay

## 2019-07-09 ENCOUNTER — Other Ambulatory Visit: Payer: Self-pay

## 2019-07-09 DIAGNOSIS — C170 Malignant neoplasm of duodenum: Secondary | ICD-10-CM | POA: Diagnosis not present

## 2019-07-09 LAB — CBC WITH DIFFERENTIAL (CANCER CENTER ONLY)
Abs Immature Granulocytes: 0.01 10*3/uL (ref 0.00–0.07)
Basophils Absolute: 0 10*3/uL (ref 0.0–0.1)
Basophils Relative: 1 %
Eosinophils Absolute: 0.1 10*3/uL (ref 0.0–0.5)
Eosinophils Relative: 2 %
HCT: 37.1 % (ref 36.0–46.0)
Hemoglobin: 12.5 g/dL (ref 12.0–15.0)
Immature Granulocytes: 0 %
Lymphocytes Relative: 22 %
Lymphs Abs: 1.2 10*3/uL (ref 0.7–4.0)
MCH: 37.1 pg — ABNORMAL HIGH (ref 26.0–34.0)
MCHC: 33.7 g/dL (ref 30.0–36.0)
MCV: 110.1 fL — ABNORMAL HIGH (ref 80.0–100.0)
Monocytes Absolute: 0.8 10*3/uL (ref 0.1–1.0)
Monocytes Relative: 14 %
Neutro Abs: 3.3 10*3/uL (ref 1.7–7.7)
Neutrophils Relative %: 61 %
Platelet Count: 290 10*3/uL (ref 150–400)
RBC: 3.37 MIL/uL — ABNORMAL LOW (ref 3.87–5.11)
RDW: 13.1 % (ref 11.5–15.5)
WBC Count: 5.4 10*3/uL (ref 4.0–10.5)
nRBC: 0 % (ref 0.0–0.2)

## 2019-07-09 LAB — CMP (CANCER CENTER ONLY)
ALT: 27 U/L (ref 0–44)
AST: 33 U/L (ref 15–41)
Albumin: 4.2 g/dL (ref 3.5–5.0)
Alkaline Phosphatase: 113 U/L (ref 38–126)
Anion gap: 9 (ref 5–15)
BUN: 15 mg/dL (ref 6–20)
CO2: 29 mmol/L (ref 22–32)
Calcium: 9 mg/dL (ref 8.9–10.3)
Chloride: 100 mmol/L (ref 98–111)
Creatinine: 0.76 mg/dL (ref 0.44–1.00)
GFR, Est AFR Am: >60 mL/min (ref >60)
GFR, Estimated: >60 mL/min (ref >60)
Glucose, Bld: 108 mg/dL — ABNORMAL HIGH (ref 70–99)
Potassium: 3.1 mmol/L — ABNORMAL LOW (ref 3.5–5.1)
Sodium: 138 mmol/L (ref 135–145)
Total Bilirubin: 0.9 mg/dL (ref 0.3–1.2)
Total Protein: 7.4 g/dL (ref 6.5–8.1)

## 2019-07-09 LAB — CEA (IN HOUSE-CHCC): CEA (CHCC-In House): 72.99 ng/mL — ABNORMAL HIGH (ref 0.00–5.00)

## 2019-07-09 MED ORDER — IOHEXOL 300 MG/ML  SOLN
100.0000 mL | Freq: Once | INTRAMUSCULAR | Status: AC | PRN
  Administered 2019-07-09: 100 mL via INTRAVENOUS

## 2019-07-09 MED ORDER — SODIUM CHLORIDE (PF) 0.9 % IJ SOLN
INTRAMUSCULAR | Status: AC
  Filled 2019-07-09: qty 50

## 2019-07-09 MED ORDER — POTASSIUM CHLORIDE CRYS ER 20 MEQ PO TBCR: 20.0000 meq | EXTENDED_RELEASE_TABLET | Freq: Every day | ORAL | 0 refills | Status: DC

## 2019-07-09 MED ORDER — HEPARIN SOD (PORK) LOCK FLUSH 100 UNIT/ML IV SOLN
500.0000 [IU] | Freq: Once | INTRAVENOUS | Status: AC
  Administered 2019-07-09: 09:00:00 500 [IU] via INTRAVENOUS

## 2019-07-09 MED ORDER — HEPARIN SOD (PORK) LOCK FLUSH 100 UNIT/ML IV SOLN
INTRAVENOUS | Status: AC
  Filled 2019-07-09: qty 5

## 2019-07-10 ENCOUNTER — Other Ambulatory Visit: Payer: Self-pay

## 2019-07-10 ENCOUNTER — Inpatient Hospital Stay (HOSPITAL_BASED_OUTPATIENT_CLINIC_OR_DEPARTMENT_OTHER): Payer: 59 | Admitting: Oncology

## 2019-07-10 ENCOUNTER — Other Ambulatory Visit: Payer: 59

## 2019-07-10 ENCOUNTER — Telehealth: Payer: Self-pay | Admitting: Oncology

## 2019-07-10 VITALS — BP 127/84 | HR 107 | Temp 98.2°F | Resp 18 | Ht 71.0 in | Wt 228.7 lb

## 2019-07-10 DIAGNOSIS — C170 Malignant neoplasm of duodenum: Secondary | ICD-10-CM | POA: Diagnosis not present

## 2019-07-10 NOTE — Progress Notes (Signed)
[facility] OFFICE PROGRESS NOTE   Diagnosis: Small bowel carcinoma  INTERVAL HISTORY:   Hailey Houston returns as scheduled.  She reports increased abdominal pain and nausea since she was here on 06/28/2019.  She had 3 episodes of vomiting yesterday.  The pain is in the upper abdomen.  She has noted a rash near the breasts.  Objective:  Vital signs in last 24 hours:  Blood pressure 127/84, pulse (!) 107, temperature 98.2 F (36.8 C), temperature source Temporal, resp. rate 18, height _0  (1.803 m), weight 228 lb 11.2 oz (103.7 kg), SpO2 96 %.     Resp: Lungs clear bilaterally Cardio: Regular rate and rhythm GI: No hepatosplenomegaly, no apparent ascites, tender at the upper abdomen on the right greater than left Vascular: No leg edema  Skin: Mild erythematous confluent rash at the medial breast fold bilaterally  Portacath/PICC-without erythema  Lab Results:  Lab Results  Component Value Date   WBC 5.4 07/09/2019   HGB 12.5 07/09/2019   HCT 37.1 07/09/2019   MCV 110.1 (H) 07/09/2019   PLT 290 07/09/2019   NEUTROABS 3.3 07/09/2019    CMP  Lab Results  Component Value Date   NA 138 07/09/2019   K 3.1 (L) 07/09/2019   CL 100 07/09/2019   CO2 29 07/09/2019   GLUCOSE 108 (H) 07/09/2019   BUN 15 07/09/2019   CREATININE 0.76 07/09/2019   CALCIUM 9.0 07/09/2019   PROT 7.4 07/09/2019   ALBUMIN 4.2 07/09/2019   AST 33 07/09/2019   ALT 27 07/09/2019   ALKPHOS 113 07/09/2019   BILITOT 0.9 07/09/2019   GFRNONAA >60 07/09/2019   GFRAA >60 07/09/2019    Lab Results  Component Value Date   CEA1 72.99 (H) 07/09/2019    Lab Results  Component Value Date   INR 1.13 07/06/2018    Imaging:  CT Chest W Contrast  Result Date: 07/09/2019 CLINICAL DATA:  Metastatic duodenal carcinoma, restaging EXAM: CT CHEST, ABDOMEN, AND PELVIS WITH CONTRAST TECHNIQUE: Multidetector CT imaging of the chest, abdomen and pelvis was performed following the standard  protocol during bolus administration of intravenous contrast. CONTRAST:  169m OMNIPAQUE IOHEXOL 300 MG/ML SOLN, additional oral enteric contrast COMPARISON:  05/13/2019, 02/04/2019 FINDINGS: CT CHEST FINDINGS Cardiovascular: Right chest port catheter. Normal heart size. No pericardial effusion. Mediastinum/Nodes: There is a newly enlarged right hilar lymph node measuring 1.7 x 1.4 cm (series 2, image 29). Newly enlarged pretracheal lymph node measuring 1.3 x 1.0 cm (series 2, image 16). Thyroid gland, trachea, and esophagus demonstrate no significant findings. Lungs/Pleura: Multiple new and enlarged pulmonary nodules, the largest in the anterior left upper lobe measuring 1.0 cm, previously 7 mm (series 6, image 35). New bandlike scarring or atelectasis of the lateral right lower lobe. No pleural effusion or pneumothorax. Musculoskeletal: No chest wall mass or suspicious bone lesions identified. CT ABDOMEN PELVIS FINDINGS Hepatobiliary: No solid liver abnormality is seen. New, marked thickening of the gallbladder wall measuring up to 1.0 cm (series 2, image 61). No gallstones or biliary ductal dilatation. Pancreas: Unremarkable. No pancreatic ductal dilatation or surrounding inflammatory changes. Spleen: Normal in size without significant abnormality. Adrenals/Urinary Tract: Adrenal glands are unremarkable. Kidneys are normal, without renal calculi, solid lesion, or hydronephrosis. Bladder is unremarkable. Stomach/Bowel: Stomach is within normal limits. Appendix appears normal. Narrowing of the descending portion of the duodenum, with a discrete mass not well appreciated by CT. Moderate burden of stool and stool balls throughout the colon. Vascular/Lymphatic: No significant vascular  findings are present. Interval enlargement of multiple retroperitoneal lymph nodes, largest lymph node adjacent to the superior mesenteric artery origin measuring 1.4 x 1.4 cm, previously 1.0 x 0.9 cm (series 2, image 62). Reproductive:  No mass or other abnormality. Other: No abdominal wall hernia or abnormality. There is extensive new and increased omental nodularity and stranding (series 2, image 97). Interval increase in fluid in the low abdomen and pelvis, now moderate volume (series 2, image 27). Musculoskeletal: No acute or significant osseous findings. IMPRESSION: 1. New and enlarged pulmonary nodules. 2. Newly enlarged right hilar and pretracheal lymph nodes. 3. Interval enlargement of multiple retroperitoneal lymph nodes. 4. New and increased omental nodularity and stranding. 5. Constellation of findings above is consistent with worsened pulmonary, nodal, and omental/peritoneal metastatic disease. 6. New, marked thickening of the gallbladder wall measuring up to 1.0 cm, of uncertain significance, possibly related to cystic duct or lymphatic obstruction by adjacent duodenal primary malignancy or portal lymph nodes, not distinctly appreciated by CT. 7. Interval increase in fluid in the low abdomen and pelvis, now moderate volume and consistent with malignant ascites. Electronically Signed   By: Eddie Candle M.D.   On: 07/09/2019 11:19   CT Abdomen Pelvis W Contrast  Result Date: 07/09/2019 CLINICAL DATA:  Metastatic duodenal carcinoma, restaging EXAM: CT CHEST, ABDOMEN, AND PELVIS WITH CONTRAST TECHNIQUE: Multidetector CT imaging of the chest, abdomen and pelvis was performed following the standard protocol during bolus administration of intravenous contrast. CONTRAST:  155m OMNIPAQUE IOHEXOL 300 MG/ML SOLN, additional oral enteric contrast COMPARISON:  05/13/2019, 02/04/2019 FINDINGS: CT CHEST FINDINGS Cardiovascular: Right chest port catheter. Normal heart size. No pericardial effusion. Mediastinum/Nodes: There is a newly enlarged right hilar lymph node measuring 1.7 x 1.4 cm (series 2, image 29). Newly enlarged pretracheal lymph node measuring 1.3 x 1.0 cm (series 2, image 16). Thyroid gland, trachea, and esophagus demonstrate no  significant findings. Lungs/Pleura: Multiple new and enlarged pulmonary nodules, the largest in the anterior left upper lobe measuring 1.0 cm, previously 7 mm (series 6, image 35). New bandlike scarring or atelectasis of the lateral right lower lobe. No pleural effusion or pneumothorax. Musculoskeletal: No chest wall mass or suspicious bone lesions identified. CT ABDOMEN PELVIS FINDINGS Hepatobiliary: No solid liver abnormality is seen. New, marked thickening of the gallbladder wall measuring up to 1.0 cm (series 2, image 61). No gallstones or biliary ductal dilatation. Pancreas: Unremarkable. No pancreatic ductal dilatation or surrounding inflammatory changes. Spleen: Normal in size without significant abnormality. Adrenals/Urinary Tract: Adrenal glands are unremarkable. Kidneys are normal, without renal calculi, solid lesion, or hydronephrosis. Bladder is unremarkable. Stomach/Bowel: Stomach is within normal limits. Appendix appears normal. Narrowing of the descending portion of the duodenum, with a discrete mass not well appreciated by CT. Moderate burden of stool and stool balls throughout the colon. Vascular/Lymphatic: No significant vascular findings are present. Interval enlargement of multiple retroperitoneal lymph nodes, largest lymph node adjacent to the superior mesenteric artery origin measuring 1.4 x 1.4 cm, previously 1.0 x 0.9 cm (series 2, image 62). Reproductive: No mass or other abnormality. Other: No abdominal wall hernia or abnormality. There is extensive new and increased omental nodularity and stranding (series 2, image 97). Interval increase in fluid in the low abdomen and pelvis, now moderate volume (series 2, image 27). Musculoskeletal: No acute or significant osseous findings. IMPRESSION: 1. New and enlarged pulmonary nodules. 2. Newly enlarged right hilar and pretracheal lymph nodes. 3. Interval enlargement of multiple retroperitoneal lymph nodes. 4. New and increased omental  nodularity  and stranding. 5. Constellation of findings above is consistent with worsened pulmonary, nodal, and omental/peritoneal metastatic disease. 6. New, marked thickening of the gallbladder wall measuring up to 1.0 cm, of uncertain significance, possibly related to cystic duct or lymphatic obstruction by adjacent duodenal primary malignancy or portal lymph nodes, not distinctly appreciated by CT. 7. Interval increase in fluid in the low abdomen and pelvis, now moderate volume and consistent with malignant ascites. Electronically Signed   By: Eddie Candle M.D.   On: 07/09/2019 11:19    Medications: I have reviewed the patient's current medications.   Assessment/Plan: 1.Adenocarcinoma of the duodenum  CT abdomen/pelvis 06/16/2018-changes of pancreatitis, fullness at the head of the pancreas without a defined mass, multiple subcentimeter peripancreatic lymph nodes, distal common bile duct stent, multiple nodules at the lung bases  EUS 06/14/2018-ulcerated mucosa/mass at the distal duodenal bulb with extension to the pancreas head, portal adenopathy, biopsy of duodenal mass-adenocarcinoma;foundation 1-microsatellite stable, tumor mutational burden 1, [id number] mutation  ERCP 06/14/2018-distal duodenal bulb mass, 1 cm proximal to the ampulla causing biliary obstruction, status post placement of a metal common bile duct stent  CT chest 06/20/2018-bilateral pulmonary nodules consistent with metastatic disease, supraclavicular, mediastinal, and hilar lymphadenopathy, changes of pancreatitis  Ultrasound-guided biopsy of a left supraclavicular lymph node 06/22/2018-metastatic adenocarcinoma  Cycle 1 FOLFOX 07/03/2018  Cycle 2 FOLFOX 07/17/2018  Cycle 3 FOLFOX 08/06/2018, Neulasta added  Cycle 4 FOLFOX 08/21/2018  CTs 09/06/2018-numerous small lung nodules generally decreased in size; new rounded masslike subpleural opacity right upper lobe with some evidence of internal cavitation,measuring 4.0 cm. Minimal fat  stranding about the pancreas and duodenum which is decreased. Discrete mass is not noted. Previously seen common bile duct stent absent. Gallbladder wall thickening with faintly calcified gallstones. No biliary ductal dilatation.  Cycle 5 FOLFOX 09/10/2018(oxaliplatin dose reduceddue to elevated liver enzymes)  Cycle 6 FOLFOX 09/24/2018(oxaliplatin further dose reduced due to elevated liver enzymes)  Cycle 7 FOLFOX 10/09/2018  Cycle 8 FOLFOX 10/22/2018  CTs 11/12/2018-continued improvement in lung metastases.  Maintenance Xeloda, daily dosing 11/25/2018  CTs 02/04/2019-decrease in bilateral lung nodules, many have resolved, no evidence of disease progression, no evidence of metastatic disease to the abdomen or pelvis  Xeloda continued  Xeloda placed on hold 02/26/2019 due to hand-foot syndrome, resumed 10/21/2020at a dose of 1000 mg twice daily  05/13/2019-mixed response of (increased size of left upper lobe nodule, decreased size of right lower lobe nodule), new "shotty "abdominal retroperitoneal lymph nodes, new tiny nodules in the left abdominal omentum  Elevated CEA 05/13/2019  CTs12/28/2020-increased size of the left upper lobe nodule, decreased right lower lobe nodule, new small retroperitoneal nodes, new subcentimeter nodules in the left omental fat  Xeloda continued  Xeloda placed on hold 06/28/2019  CTs 07/09/2019-when enlarged pulmonary nodules, new right hilar and pretracheal lymph nodes, enlargement of retroperitoneal lymph nodes, increased omental nodularity and stranding, gallbladder wall thickening-new, increase in ascites 2.Biliary obstruction secondary to the duodenal mass, status post placement of a bile duct stent 06/14/2018 3.Post ERCP pancreatitis 06/16/2018 4.Anemia secondary to phlebotomy and multiple procedures 5.G2, P2 6.Nausea secondary to #1  Upper GI 06/27/2018-moderate proximal duodenal narrowing, no evidence of gastric outlet  obstruction 7.Attention deficit disorder 8. Abdominal pain-secondary to the small bowel carcinoma versus pancreatitis  9. Rash-improved 10. Neutropenia secondary to chemotherapy 07/31/2018 11.Elevated transaminases;improved 09/07/2018; improved 09/10/2018. Question related to chemotherapy. 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  There is clinical and CT evidence of  disease progression.  The abdominal pain and nausea are most likely related to carcinomatosis and the small bowel primary.  She does not have symptoms to suggest an obstruction at present.  I doubt she has acute gallbladder inflammation.  She will contact us for increased abdominal pain or nausea.  The plan is to begin FOLFIRI chemotherapy.  I reviewed potential toxicities associated with the FOLFIRI regimen including the chance for nausea/vomiting, alopecia, diarrhea, and hematologic toxicity.  She agrees to proceed.  The plan is to begin FOLFIRI chemotherapy on 07/15/2019 or 07/16/2019.  She will return for an office visit prior to cycle 2.  She will continue Dilaudid for pain.  She will contact us if Dilaudid does not relieve the pain.  She appears to have a mild yeast rash at the anterior chest/breast skin fold.  She will keep the areas dry and use a cornstarch powder.  Betsy Coder, MD  07/10/2019  10:18 AM

## 2019-07-10 NOTE — Telephone Encounter (Signed)
Scheduled appt per 2/24 los =- gave patient AVS and calender per los

## 2019-07-10 NOTE — Progress Notes (Signed)
START OFF PATHWAY REGIMEN - Other   [id number]:FOLFIRI (q14d):   A cycle is every 14 days:     Irinotecan      Leucovorin      Fluorouracil      Fluorouracil   **Always confirm dose/schedule in your pharmacy ordering system**  Patient Characteristics: Intent of Therapy: Non-Curative / Palliative Intent, Discussed with Patient

## 2019-07-11 ENCOUNTER — Other Ambulatory Visit: Payer: Self-pay | Admitting: Nurse Practitioner

## 2019-07-11 DIAGNOSIS — K858 Other acute pancreatitis without necrosis or infection: Secondary | ICD-10-CM

## 2019-07-11 DIAGNOSIS — C170 Malignant neoplasm of duodenum: Secondary | ICD-10-CM

## 2019-07-11 MED ORDER — HYDROMORPHONE HCL 4 MG PO TABS: 4.0000 mg | ORAL_TABLET | Freq: Three times a day (TID) | ORAL | 0 refills | Status: DC | PRN

## 2019-07-13 ENCOUNTER — Other Ambulatory Visit: Payer: Self-pay | Admitting: Oncology

## 2019-07-15 ENCOUNTER — Ambulatory Visit: Payer: 59 | Admitting: Oncology

## 2019-07-15 ENCOUNTER — Inpatient Hospital Stay: Payer: 59

## 2019-07-15 ENCOUNTER — Other Ambulatory Visit: Payer: 59

## 2019-07-15 ENCOUNTER — Other Ambulatory Visit: Payer: Self-pay

## 2019-07-15 ENCOUNTER — Inpatient Hospital Stay: Payer: 59 | Attending: Oncology

## 2019-07-15 VITALS — BP 134/90 | HR 95 | Temp 98.3°F | Resp 18 | Wt 232.2 lb

## 2019-07-15 DIAGNOSIS — C170 Malignant neoplasm of duodenum: Secondary | ICD-10-CM

## 2019-07-15 DIAGNOSIS — C7800 Secondary malignant neoplasm of unspecified lung: Secondary | ICD-10-CM | POA: Insufficient documentation

## 2019-07-15 DIAGNOSIS — C7889 Secondary malignant neoplasm of other digestive organs: Secondary | ICD-10-CM | POA: Insufficient documentation

## 2019-07-15 DIAGNOSIS — Z95828 Presence of other vascular implants and grafts: Secondary | ICD-10-CM

## 2019-07-15 DIAGNOSIS — D649 Anemia, unspecified: Secondary | ICD-10-CM | POA: Insufficient documentation

## 2019-07-15 DIAGNOSIS — R490 Dysphonia: Secondary | ICD-10-CM | POA: Insufficient documentation

## 2019-07-15 DIAGNOSIS — Z5189 Encounter for other specified aftercare: Secondary | ICD-10-CM | POA: Diagnosis not present

## 2019-07-15 DIAGNOSIS — D701 Agranulocytosis secondary to cancer chemotherapy: Secondary | ICD-10-CM | POA: Insufficient documentation

## 2019-07-15 DIAGNOSIS — G620 Drug-induced polyneuropathy: Secondary | ICD-10-CM | POA: Diagnosis not present

## 2019-07-15 DIAGNOSIS — Z452 Encounter for adjustment and management of vascular access device: Secondary | ICD-10-CM | POA: Insufficient documentation

## 2019-07-15 DIAGNOSIS — Z5111 Encounter for antineoplastic chemotherapy: Secondary | ICD-10-CM | POA: Diagnosis not present

## 2019-07-15 LAB — CBC WITH DIFFERENTIAL (CANCER CENTER ONLY)
Abs Immature Granulocytes: 0.02 10*3/uL (ref 0.00–0.07)
Basophils Absolute: 0 10*3/uL (ref 0.0–0.1)
Basophils Relative: 1 %
Eosinophils Absolute: 0.1 10*3/uL (ref 0.0–0.5)
Eosinophils Relative: 2 %
HCT: 35 % — ABNORMAL LOW (ref 36.0–46.0)
Hemoglobin: 11.9 g/dL — ABNORMAL LOW (ref 12.0–15.0)
Immature Granulocytes: 0 %
Lymphocytes Relative: 21 %
Lymphs Abs: 1.2 10*3/uL (ref 0.7–4.0)
MCH: 36.6 pg — ABNORMAL HIGH (ref 26.0–34.0)
MCHC: 34 g/dL (ref 30.0–36.0)
MCV: 107.7 fL — ABNORMAL HIGH (ref 80.0–100.0)
Monocytes Absolute: 0.7 10*3/uL (ref 0.1–1.0)
Monocytes Relative: 13 %
Neutro Abs: 3.4 10*3/uL (ref 1.7–7.7)
Neutrophils Relative %: 63 %
Platelet Count: 316 10*3/uL (ref 150–400)
RBC: 3.25 MIL/uL — ABNORMAL LOW (ref 3.87–5.11)
RDW: 13.2 % (ref 11.5–15.5)
WBC Count: 5.5 10*3/uL (ref 4.0–10.5)
nRBC: 0 % (ref 0.0–0.2)

## 2019-07-15 LAB — CMP (CANCER CENTER ONLY)
ALT: 39 U/L (ref 0–44)
AST: 41 U/L (ref 15–41)
Albumin: 4 g/dL (ref 3.5–5.0)
Alkaline Phosphatase: 130 U/L — ABNORMAL HIGH (ref 38–126)
Anion gap: 8 (ref 5–15)
BUN: 11 mg/dL (ref 6–20)
CO2: 29 mmol/L (ref 22–32)
Calcium: 9.2 mg/dL (ref 8.9–10.3)
Chloride: 100 mmol/L (ref 98–111)
Creatinine: 0.7 mg/dL (ref 0.44–1.00)
GFR, Est AFR Am: >60 mL/min (ref >60)
GFR, Estimated: >60 mL/min (ref >60)
Glucose, Bld: 86 mg/dL (ref 70–99)
Potassium: 4.2 mmol/L (ref 3.5–5.1)
Sodium: 137 mmol/L (ref 135–145)
Total Bilirubin: 0.7 mg/dL (ref 0.3–1.2)
Total Protein: 7.3 g/dL (ref 6.5–8.1)

## 2019-07-15 LAB — CEA (IN HOUSE-CHCC): CEA (CHCC-In House): 58.3 ng/mL — ABNORMAL HIGH (ref 0.00–5.00)

## 2019-07-15 MED ORDER — SODIUM CHLORIDE 0.9% FLUSH
10.0000 mL | INTRAVENOUS | Status: DC | PRN
  Filled 2019-07-15: qty 10

## 2019-07-15 MED ORDER — SODIUM CHLORIDE 0.9 % IV SOLN
Freq: Once | INTRAVENOUS | Status: AC
  Filled 2019-07-15: qty 250

## 2019-07-15 MED ORDER — FLUOROURACIL CHEMO INJECTION 2.5 GM/50ML
400.0000 mg/m2 | Freq: Once | INTRAVENOUS | Status: AC
  Administered 2019-07-15: 900 mg via INTRAVENOUS
  Filled 2019-07-15: qty 18

## 2019-07-15 MED ORDER — ATROPINE SULFATE 1 MG/ML IJ SOLN
0.5000 mg | Freq: Once | INTRAMUSCULAR | Status: AC | PRN
  Administered 2019-07-15: 0.5 mg via INTRAVENOUS

## 2019-07-15 MED ORDER — SODIUM CHLORIDE 0.9 % IV SOLN
400.0000 mg/m2 | Freq: Once | INTRAVENOUS | Status: AC
  Administered 2019-07-15: 912 mg via INTRAVENOUS
  Filled 2019-07-15: qty 45.6

## 2019-07-15 MED ORDER — PALONOSETRON HCL INJECTION 0.25 MG/5ML
INTRAVENOUS | Status: AC
  Filled 2019-07-15: qty 5

## 2019-07-15 MED ORDER — HEPARIN SOD (PORK) LOCK FLUSH 100 UNIT/ML IV SOLN
500.0000 [IU] | Freq: Once | INTRAVENOUS | Status: DC | PRN
  Filled 2019-07-15: qty 5

## 2019-07-15 MED ORDER — ATROPINE SULFATE 1 MG/ML IJ SOLN
INTRAMUSCULAR | Status: AC
  Filled 2019-07-15: qty 1

## 2019-07-15 MED ORDER — SODIUM CHLORIDE 0.9 % IV SOLN
2400.0000 mg/m2 | INTRAVENOUS | Status: DC
  Administered 2019-07-15: 5450 mg via INTRAVENOUS
  Filled 2019-07-15: qty 109

## 2019-07-15 MED ORDER — SODIUM CHLORIDE 0.9 % IV SOLN
150.0000 mg | Freq: Once | INTRAVENOUS | Status: AC
  Administered 2019-07-15: 150 mg via INTRAVENOUS
  Filled 2019-07-15: qty 150

## 2019-07-15 MED ORDER — PALONOSETRON HCL INJECTION 0.25 MG/5ML
0.2500 mg | Freq: Once | INTRAVENOUS | Status: AC
  Administered 2019-07-15: 0.25 mg via INTRAVENOUS

## 2019-07-15 MED ORDER — DEXAMETHASONE SODIUM PHOSPHATE 10 MG/ML IJ SOLN
10.0000 mg | Freq: Once | INTRAMUSCULAR | Status: AC
  Administered 2019-07-15: 10 mg via INTRAVENOUS

## 2019-07-15 MED ORDER — DEXAMETHASONE SODIUM PHOSPHATE 10 MG/ML IJ SOLN
INTRAMUSCULAR | Status: AC
  Filled 2019-07-15: qty 1

## 2019-07-15 MED ORDER — SODIUM CHLORIDE 0.9 % IV SOLN
400.0000 mg | Freq: Once | INTRAVENOUS | Status: AC
  Administered 2019-07-15: 400 mg via INTRAVENOUS
  Filled 2019-07-15: qty 15

## 2019-07-15 MED ORDER — SODIUM CHLORIDE 0.9% FLUSH
10.0000 mL | Freq: Once | INTRAVENOUS | Status: AC
  Administered 2019-07-15: 10 mL
  Filled 2019-07-15: qty 10

## 2019-07-15 NOTE — Patient Instructions (Signed)
Irinotecan injection What is this medicine? IRINOTECAN (ir in oh TEE kan ) is a chemotherapy drug. It is used to treat colon and rectal cancer. This medicine may be used for other purposes; ask your health care provider or pharmacist if you have questions. COMMON BRAND NAME(S): Camptosar What should I tell my health care provider before I take this medicine? They need to know if you have any of these conditions:  dehydration  diarrhea  infection (especially a virus infection such as chickenpox, cold sores, or herpes)  liver disease  low blood counts, like low white cell, platelet, or red cell counts  low levels of calcium, magnesium, or potassium in the blood  recent or ongoing radiation therapy  an unusual or allergic reaction to irinotecan, other medicines, foods, dyes, or preservatives  pregnant or trying to get pregnant  breast-feeding How should I use this medicine? This drug is given as an infusion into a vein. It is administered in a hospital or clinic by a specially trained health care professional. Talk to your pediatrician regarding the use of this medicine in children. Special care may be needed. Overdosage: If you think you have taken too much of this medicine contact a poison control center or emergency room at once. NOTE: This medicine is only for you. Do not share this medicine with others. What if I miss a dose? It is important not to miss your dose. Call your doctor or health care professional if you are unable to keep an appointment. What may interact with this medicine? This medicine may interact with the following medications:  antiviral medicines for HIV or AIDS  certain antibiotics like rifampin or rifabutin  certain medicines for fungal infections like itraconazole, ketoconazole, posaconazole, and voriconazole  certain medicines for seizures like carbamazepine, phenobarbital, phenotoin  clarithromycin  gemfibrozil  nefazodone  [facility] John's  Wort This list may not describe all possible interactions. Give your health care provider a list of all the medicines, herbs, non-prescription drugs, or dietary supplements you use. Also tell them if you smoke, drink alcohol, or use illegal drugs. Some items may interact with your medicine. What should I watch for while using this medicine? Your condition will be monitored carefully while you are receiving this medicine. You will need important blood work done while you are taking this medicine. This drug may make you feel generally unwell. This is not uncommon, as chemotherapy can affect healthy cells as well as cancer cells. Report any side effects. Continue your course of treatment even though you feel ill unless your doctor tells you to stop. In some cases, you may be given additional medicines to help with side effects. Follow all directions for their use. You may get drowsy or dizzy. Do not drive, use machinery, or do anything that needs mental alertness until you know how this medicine affects you. Do not stand or sit up quickly, especially if you are an older patient. This reduces the risk of dizzy or fainting spells. Call your health care professional for advice if you get a fever, chills, or sore throat, or other symptoms of a cold or flu. Do not treat yourself. This medicine decreases your body's ability to fight infections. Try to avoid being around people who are sick. Avoid taking products that contain aspirin, acetaminophen, ibuprofen, naproxen, or ketoprofen unless instructed by your doctor. These medicines may hide a fever. This medicine may increase your risk to bruise or bleed. Call your doctor or health care professional if you notice  any unusual bleeding. Be careful brushing and flossing your teeth or using a toothpick because you may get an infection or bleed more easily. If you have any dental work done, tell your dentist you are receiving this medicine. Do not become pregnant while  taking this medicine or for 6 months after stopping it. Women should inform their health care professional if they wish to become pregnant or think they might be pregnant. Men should not father a child while taking this medicine and for 3 months after stopping it. There is potential for serious side effects to an unborn child. Talk to your health care professional for more information. Do not breast-feed an infant while taking this medicine or for 7 days after stopping it. This medicine has caused ovarian failure in some women. This medicine may make it more difficult to get pregnant. Talk to your health care professional if you are concerned about your fertility. This medicine has caused decreased sperm counts in some men. This may make it more difficult to father a child. Talk to your health care professional if you are concerned about your fertility. What side effects may I notice from receiving this medicine? Side effects that you should report to your doctor or health care professional as soon as possible:  allergic reactions like skin rash, itching or hives, swelling of the face, lips, or tongue  chest pain  diarrhea  flushing, runny nose, sweating during infusion  low blood counts - this medicine may decrease the number of white blood cells, red blood cells and platelets. You may be at increased risk for infections and bleeding.  nausea, vomiting  pain, swelling, warmth in the leg  signs of decreased platelets or bleeding - bruising, pinpoint red spots on the skin, black, tarry stools, blood in the urine  signs of infection - fever or chills, cough, sore throat, pain or difficulty passing urine  signs of decreased red blood cells - unusually weak or tired, fainting spells, lightheadedness Side effects that usually do not require medical attention (report to your doctor or health care professional if they continue or are bothersome):  constipation  hair loss  headache  loss of  appetite  mouth sores  stomach pain This list may not describe all possible side effects. Call your doctor for medical advice about side effects. You may report side effects to FDA at 1-800-FDA-1088. Where should I keep my medicine? This drug is given in a hospital or clinic and will not be stored at home. NOTE: This sheet is a summary. It may not cover all possible information. If you have questions about this medicine, talk to your doctor, pharmacist, or health care provider.  2020 Elsevier/Gold Standard (2018-06-22 10:09:17)  Leucovorin injection What is this medicine? LEUCOVORIN (loo koe VOR in) is used to prevent or treat the harmful effects of some medicines. This medicine is used to treat anemia caused by a low amount of folic acid in the body. It is also used with 5-fluorouracil (5-FU) to treat colon cancer. This medicine may be used for other purposes; ask your health care provider or pharmacist if you have questions. What should I tell my health care provider before I take this medicine? They need to know if you have any of these conditions:  anemia from low levels of vitamin B-12 in the blood  an unusual or allergic reaction to leucovorin, folic acid, other medicines, foods, dyes, or preservatives  pregnant or trying to get pregnant  breast-feeding How should I  use this medicine? This medicine is for injection into a muscle or into a vein. It is given by a health care professional in a hospital or clinic setting. Talk to your pediatrician regarding the use of this medicine in children. Special care may be needed. Overdosage: If you think you have taken too much of this medicine contact a poison control center or emergency room at once. NOTE: This medicine is only for you. Do not share this medicine with others. What if I miss a dose? This does not apply. What may interact with this  medicine?  capecitabine  fluorouracil  phenobarbital  phenytoin  primidone  trimethoprim-sulfamethoxazole This list may not describe all possible interactions. Give your health care provider a list of all the medicines, herbs, non-prescription drugs, or dietary supplements you use. Also tell them if you smoke, drink alcohol, or use illegal drugs. Some items may interact with your medicine. What should I watch for while using this medicine? Your condition will be monitored carefully while you are receiving this medicine. This medicine may increase the side effects of 5-fluorouracil, 5-FU. Tell your doctor or health care professional if you have diarrhea or mouth sores that do not get better or that get worse. What side effects may I notice from receiving this medicine? Side effects that you should report to your doctor or health care professional as soon as possible:  allergic reactions like skin rash, itching or hives, swelling of the face, lips, or tongue  breathing problems  fever, infection  mouth sores  unusual bleeding or bruising  unusually weak or tired Side effects that usually do not require medical attention (report to your doctor or health care professional if they continue or are bothersome):  constipation or diarrhea  loss of appetite  nausea, vomiting This list may not describe all possible side effects. Call your doctor for medical advice about side effects. You may report side effects to FDA at 1-800-FDA-1088. Where should I keep my medicine? This drug is given in a hospital or clinic and will not be stored at home. NOTE: This sheet is a summary. It may not cover all possible information. If you have questions about this medicine, talk to your doctor, pharmacist, or health care provider.  2020 Elsevier/Gold Standard (2007-11-06 16:50:29)  Fluorouracil, 5-FU injection What is this medicine? FLUOROURACIL, 5-FU (flure oh YOOR a sil) is a chemotherapy drug.  It slows the growth of cancer cells. This medicine is used to treat many types of cancer like breast cancer, colon or rectal cancer, pancreatic cancer, and stomach cancer. This medicine may be used for other purposes; ask your health care provider or pharmacist if you have questions. COMMON BRAND NAME(S): Adrucil What should I tell my health care provider before I take this medicine? They need to know if you have any of these conditions:  blood disorders  dihydropyrimidine dehydrogenase (DPD) deficiency  infection (especially a virus infection such as chickenpox, cold sores, or herpes)  kidney disease  liver disease  malnourished, poor nutrition  recent or ongoing radiation therapy  an unusual or allergic reaction to fluorouracil, other chemotherapy, other medicines, foods, dyes, or preservatives  pregnant or trying to get pregnant  breast-feeding How should I use this medicine? This drug is given as an infusion or injection into a vein. It is administered in a hospital or clinic by a specially trained health care professional. Talk to your pediatrician regarding the use of this medicine in children. Special care may be needed. Overdosage:  If you think you have taken too much of this medicine contact a poison control center or emergency room at once. NOTE: This medicine is only for you. Do not share this medicine with others. What if I miss a dose? It is important not to miss your dose. Call your doctor or health care professional if you are unable to keep an appointment. What may interact with this medicine?  allopurinol  cimetidine  dapsone  digoxin  hydroxyurea  leucovorin  levamisole  medicines for seizures like ethotoin, fosphenytoin, phenytoin  medicines to increase blood counts like filgrastim, pegfilgrastim, sargramostim  medicines that treat or prevent blood clots like warfarin, enoxaparin, and  dalteparin  methotrexate  metronidazole  pyrimethamine  some other chemotherapy drugs like busulfan, cisplatin, estramustine, vinblastine  trimethoprim  trimetrexate  vaccines Talk to your doctor or health care professional before taking any of these medicines:  acetaminophen  aspirin  ibuprofen  ketoprofen  naproxen This list may not describe all possible interactions. Give your health care provider a list of all the medicines, herbs, non-prescription drugs, or dietary supplements you use. Also tell them if you smoke, drink alcohol, or use illegal drugs. Some items may interact with your medicine. What should I watch for while using this medicine? Visit your doctor for checks on your progress. This drug may make you feel generally unwell. This is not uncommon, as chemotherapy can affect healthy cells as well as cancer cells. Report any side effects. Continue your course of treatment even though you feel ill unless your doctor tells you to stop. In some cases, you may be given additional medicines to help with side effects. Follow all directions for their use. Call your doctor or health care professional for advice if you get a fever, chills or sore throat, or other symptoms of a cold or flu. Do not treat yourself. This drug decreases your body's ability to fight infections. Try to avoid being around people who are sick. This medicine may increase your risk to bruise or bleed. Call your doctor or health care professional if you notice any unusual bleeding. Be careful brushing and flossing your teeth or using a toothpick because you may get an infection or bleed more easily. If you have any dental work done, tell your dentist you are receiving this medicine. Avoid taking products that contain aspirin, acetaminophen, ibuprofen, naproxen, or ketoprofen unless instructed by your doctor. These medicines may hide a fever. Do not become pregnant while taking this medicine. Women should  inform their doctor if they wish to become pregnant or think they might be pregnant. There is a potential for serious side effects to an unborn child. Talk to your health care professional or pharmacist for more information. Do not breast-feed an infant while taking this medicine. Men should inform their doctor if they wish to father a child. This medicine may lower sperm counts. Do not treat diarrhea with over the counter products. Contact your doctor if you have diarrhea that lasts more than 2 days or if it is severe and watery. This medicine can make you more sensitive to the sun. Keep out of the sun. If you cannot avoid being in the sun, wear protective clothing and use sunscreen. Do not use sun lamps or tanning beds/booths. What side effects may I notice from receiving this medicine? Side effects that you should report to your doctor or health care professional as soon as possible:  allergic reactions like skin rash, itching or hives, swelling of the face,  lips, or tongue  low blood counts - this medicine may decrease the number of white blood cells, red blood cells and platelets. You may be at increased risk for infections and bleeding.  signs of infection - fever or chills, cough, sore throat, pain or difficulty passing urine  signs of decreased platelets or bleeding - bruising, pinpoint red spots on the skin, black, tarry stools, blood in the urine  signs of decreased red blood cells - unusually weak or tired, fainting spells, lightheadedness  breathing problems  changes in vision  chest pain  mouth sores  nausea and vomiting  pain, swelling, redness at site where injected  pain, tingling, numbness in the hands or feet  redness, swelling, or sores on hands or feet  stomach pain  unusual bleeding Side effects that usually do not require medical attention (report to your doctor or health care professional if they continue or are bothersome):  changes in finger or toe  nails  diarrhea  dry or itchy skin  hair loss  headache  loss of appetite  sensitivity of eyes to the light  stomach upset  unusually teary eyes This list may not describe all possible side effects. Call your doctor for medical advice about side effects. You may report side effects to FDA at 1-800-FDA-1088. Where should I keep my medicine? This drug is given in a hospital or clinic and will not be stored at home. NOTE: This sheet is a summary. It may not cover all possible information. If you have questions about this medicine, talk to your doctor, pharmacist, or health care provider.  2020 Elsevier/Gold Standard (2007-09-05 13:53:16)

## 2019-07-16 ENCOUNTER — Telehealth: Payer: Self-pay | Admitting: *Deleted

## 2019-07-16 NOTE — Telephone Encounter (Signed)
Just realized she has not had a BM in 1 week. Took dulcolax suppository today without results. Asking for advice. Having some abdominal pain and mild nausea. NO vomiting. She did receive atropine during her treatment yesterday. Per Dr. Benay Spice: Take Miralax 17 grams bid and colace 200 mg bid. Stay on liquid diet remainder of day. Call tomorrow if no results.

## 2019-07-17 ENCOUNTER — Telehealth: Payer: Self-pay | Admitting: *Deleted

## 2019-07-17 ENCOUNTER — Other Ambulatory Visit: Payer: Self-pay

## 2019-07-17 ENCOUNTER — Inpatient Hospital Stay: Payer: 59

## 2019-07-17 VITALS — BP 148/93 | HR 98 | Temp 98.1°F | Resp 18

## 2019-07-17 DIAGNOSIS — Z5111 Encounter for antineoplastic chemotherapy: Secondary | ICD-10-CM | POA: Diagnosis not present

## 2019-07-17 DIAGNOSIS — C170 Malignant neoplasm of duodenum: Secondary | ICD-10-CM

## 2019-07-17 MED ORDER — MAGNESIUM CITRATE PO SOLN: 1.0000 | Freq: Once | ORAL | Status: AC

## 2019-07-17 MED ORDER — SODIUM CHLORIDE 0.9% FLUSH
10.0000 mL | INTRAVENOUS | Status: DC | PRN
  Administered 2019-07-17: 10 mL
  Filled 2019-07-17: qty 10

## 2019-07-17 MED ORDER — DOCUSATE SODIUM 100 MG PO CAPS: 200.0000 mg | ORAL_CAPSULE | Freq: Two times a day (BID) | ORAL | 0 refills | Status: AC | PRN

## 2019-07-17 MED ORDER — HEPARIN SOD (PORK) LOCK FLUSH 100 UNIT/ML IV SOLN
500.0000 [IU] | Freq: Once | INTRAVENOUS | Status: AC | PRN
  Administered 2019-07-17: 500 [IU]
  Filled 2019-07-17: qty 5

## 2019-07-17 NOTE — Telephone Encounter (Signed)
Informed the flush nurse that she still has not had BM despite the bid Miralax and Colace. Per Dr. Benay Spice: Continue above and drink 1/2 bottle of magnesium citrate today and repeat in morning if no BM. Also obtain small Fleet enema to administer this today. She agrees to call tomorrow if no results.

## 2019-07-19 ENCOUNTER — Inpatient Hospital Stay: Payer: 59 | Admitting: General Practice

## 2019-07-19 DIAGNOSIS — C170 Malignant neoplasm of duodenum: Secondary | ICD-10-CM

## 2019-07-19 NOTE — Progress Notes (Signed)
[facility] CSW Progress Notes  Met w patient via Web [employer].  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  Feels she is overwhelmed and cannot continue in this vein, rates her mood/energy as 1 - 3/10.  Has heightened need for sleep, needs to go to bed ASAP in evening.  Wrestling with continuing to work vs taking short term disability.  Explored options and implications.  She plans to call [employer] [facility] to discuss logistics of short term disability and how to protect her job.  Depending on results, she will explore options further to go out on leave while she is adjusting to new chemo regimen. She is also planning to reach out to school to find out additional resources to support middle school son who is struggling w [employer].  Will see her in two weeks.  Edwyna Shell, LCSW Clinical Social Worker Phone:  [phone] Cell:  [phone]

## 2019-07-22 ENCOUNTER — Telehealth: Payer: Self-pay | Admitting: *Deleted

## 2019-07-22 NOTE — Telephone Encounter (Signed)
Having difficulty working during treatment. Asking if Dr. Benay Spice will agree that she qualifies for STD? Per Dr. Benay Spice: Yes, would also qualify for LTD.

## 2019-07-23 ENCOUNTER — Telehealth: Payer: Self-pay | Admitting: *Deleted

## 2019-07-23 NOTE — Telephone Encounter (Signed)
Called to inquire if the FMLA forms are ready for her sister that were dropped off on 07/15/19. Forwarded message to [place], Therapist, sports.

## 2019-07-24 ENCOUNTER — Other Ambulatory Visit: Payer: Self-pay | Admitting: Oncology

## 2019-07-28 ENCOUNTER — Other Ambulatory Visit: Payer: Self-pay | Admitting: Oncology

## 2019-07-29 ENCOUNTER — Inpatient Hospital Stay (HOSPITAL_BASED_OUTPATIENT_CLINIC_OR_DEPARTMENT_OTHER): Payer: 59 | Admitting: Oncology

## 2019-07-29 ENCOUNTER — Telehealth: Payer: Self-pay | Admitting: Oncology

## 2019-07-29 ENCOUNTER — Inpatient Hospital Stay: Payer: 59

## 2019-07-29 ENCOUNTER — Inpatient Hospital Stay: Payer: 59 | Admitting: Nutrition

## 2019-07-29 ENCOUNTER — Ambulatory Visit: Payer: 59 | Admitting: Nutrition

## 2019-07-29 ENCOUNTER — Other Ambulatory Visit: Payer: Self-pay

## 2019-07-29 DIAGNOSIS — C170 Malignant neoplasm of duodenum: Secondary | ICD-10-CM

## 2019-07-29 DIAGNOSIS — K858 Other acute pancreatitis without necrosis or infection: Secondary | ICD-10-CM | POA: Diagnosis not present

## 2019-07-29 DIAGNOSIS — Z5111 Encounter for antineoplastic chemotherapy: Secondary | ICD-10-CM | POA: Diagnosis not present

## 2019-07-29 LAB — CMP (CANCER CENTER ONLY)
ALT: 30 U/L (ref 0–44)
AST: 30 U/L (ref 15–41)
Albumin: 4 g/dL (ref 3.5–5.0)
Alkaline Phosphatase: 125 U/L (ref 38–126)
Anion gap: 12 (ref 5–15)
BUN: 15 mg/dL (ref 6–20)
CO2: 28 mmol/L (ref 22–32)
Calcium: 9.2 mg/dL (ref 8.9–10.3)
Chloride: 99 mmol/L (ref 98–111)
Creatinine: 0.8 mg/dL (ref 0.44–1.00)
GFR, Est AFR Am: >60 mL/min (ref >60)
GFR, Estimated: >60 mL/min (ref >60)
Glucose, Bld: 107 mg/dL — ABNORMAL HIGH (ref 70–99)
Potassium: 3.4 mmol/L — ABNORMAL LOW (ref 3.5–5.1)
Sodium: 139 mmol/L (ref 135–145)
Total Bilirubin: 0.7 mg/dL (ref 0.3–1.2)
Total Protein: 7.3 g/dL (ref 6.5–8.1)

## 2019-07-29 LAB — CBC WITH DIFFERENTIAL (CANCER CENTER ONLY)
Abs Immature Granulocytes: 0 10*3/uL (ref 0.00–0.07)
Basophils Absolute: 0 10*3/uL (ref 0.0–0.1)
Basophils Relative: 1 %
Eosinophils Absolute: 0.1 10*3/uL (ref 0.0–0.5)
Eosinophils Relative: 3 %
HCT: 33.6 % — ABNORMAL LOW (ref 36.0–46.0)
Hemoglobin: 11.6 g/dL — ABNORMAL LOW (ref 12.0–15.0)
Immature Granulocytes: 0 %
Lymphocytes Relative: 51 %
Lymphs Abs: 0.9 10*3/uL (ref 0.7–4.0)
MCH: 35 pg — ABNORMAL HIGH (ref 26.0–34.0)
MCHC: 34.5 g/dL (ref 30.0–36.0)
MCV: 101.5 fL — ABNORMAL HIGH (ref 80.0–100.0)
Monocytes Absolute: 0.3 10*3/uL (ref 0.1–1.0)
Monocytes Relative: 18 %
Neutro Abs: 0.5 10*3/uL — ABNORMAL LOW (ref 1.7–7.7)
Neutrophils Relative %: 27 %
Platelet Count: 309 10*3/uL (ref 150–400)
RBC: 3.31 MIL/uL — ABNORMAL LOW (ref 3.87–5.11)
RDW: 13.5 % (ref 11.5–15.5)
WBC Count: 1.7 10*3/uL — ABNORMAL LOW (ref 4.0–10.5)
nRBC: 0 % (ref 0.0–0.2)

## 2019-07-29 LAB — MAGNESIUM: Magnesium: 2 mg/dL (ref 1.7–2.4)

## 2019-07-29 MED ORDER — HYDROMORPHONE HCL 4 MG PO TABS: 4.0000 mg | ORAL_TABLET | Freq: Three times a day (TID) | ORAL | 0 refills | Status: DC | PRN

## 2019-07-29 NOTE — Telephone Encounter (Signed)
Scheduled per los. Called and left msg  ° °

## 2019-07-29 NOTE — Progress Notes (Signed)
Patient was identified to be at risk for malnutrition on the MST secondary to poor appetite and weight loss.  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  She is a patient of Dr. Benay Spice.  Patient is receiving FOLFIRI every 14 days.  Past medical history includes anxiety and ADD.  Medications include Adderall, Lexapro, Ativan, Zofran, Protonix, MiraLAX, Compazine.  Labs include potassium 3.4 and glucose 117.  Height: 5 feet 11 inches. Weight: 222.4 pounds on March 15. Patient weighed 240.8 pounds on April 09, 2019. BMI: 31.02.  Patient reports she has no interest in food.  She has increased abdominal pain when eating. She has some nausea and vomiting. Reports constipation and is taking MiraLAX.   She wonders if Marinol would help her appetite.  Nutrition diagnosis: Unintended weight loss related to metastatic cancer as evidenced by 8% weight loss over 4 months.  Intervention: Educated patient on the importance of smaller more frequent meals and snacks with adequate calories and protein. Encouraged patient to take nausea medication as needed. Recommended bowel regimen to relieve constipation.  Encouraged adequate fluids. Recommended oral nutrition supplements and provided samples. Communicated patient's request for Marinol to MD.  Monitoring, note evaluation, goals: Patient will tolerate gradual increase of oral intake to minimize further weight loss.  Next visit: To be scheduled as needed.  **Disclaimer: This note was dictated with voice recognition software. Similar sounding words can inadvertently be transcribed and this note may contain transcription errors which may not have been corrected upon publication of note.**

## 2019-07-29 NOTE — Progress Notes (Signed)
[facility] OFFICE PROGRESS NOTE   Diagnosis: Small bowel carcinoma  INTERVAL HISTORY:   [name] returns as scheduled.  She completed cycle 1 FOLFIRI on 07/15/2019.  She had nausea following chemotherapy, but no emesis.  No mouth sores or diarrhea.  She has increased cramping abdominal discomfort, chiefly at the lower abdomen.  She takes Dilaudid for pain.  No difficulty with urination. She has developed hoarseness for the past 3 weeks.  Objective:  Vital signs in last 24 hours:  Blood pressure 122/76, pulse (!) 111, temperature 98.2 F (36.8 C), temperature source Temporal, resp. rate 17, height 5' 11"  (1.803 m), weight 222 lb 6.4 oz (100.9 kg), SpO2 98 %.  Manual pulse 100    HEENT: No thrush or ulcers GI: Soft, no hepatosplenomegaly, no mass, mild diffuse tenderness Vascular: No leg edema  Skin: Palms without erythema  Portacath/PICC-without erythema  Lab Results:  Lab Results  Component Value Date   WBC 1.7 (L) 07/29/2019   HGB 11.6 (L) 07/29/2019   HCT 33.6 (L) 07/29/2019   MCV 101.5 (H) 07/29/2019   PLT 309 07/29/2019   NEUTROABS 0.5 (L) 07/29/2019    CMP  Lab Results  Component Value Date   NA 137 07/15/2019   K 4.2 07/15/2019   CL 100 07/15/2019   CO2 29 07/15/2019   GLUCOSE 86 07/15/2019   BUN 11 07/15/2019   CREATININE 0.70 07/15/2019   CALCIUM 9.2 07/15/2019   PROT 7.3 07/15/2019   ALBUMIN 4.0 07/15/2019   AST 41 07/15/2019   ALT 39 07/15/2019   ALKPHOS 130 (H) 07/15/2019   BILITOT 0.7 07/15/2019   GFRNONAA >60 07/15/2019   GFRAA >60 07/15/2019    Lab Results  Component Value Date   CEA1 58.30 (H) 07/15/2019    Medications: I have reviewed the patient's current medications.   Assessment/Plan: 1.Adenocarcinoma of the duodenum  CT abdomen/pelvis 06/16/2018-changes of pancreatitis, fullness at the head of the pancreas without a defined mass, multiple subcentimeter peripancreatic lymph nodes, distal common bile duct stent,  multiple nodules at the lung bases  EUS 06/14/2018-ulcerated mucosa/mass at the distal duodenal bulb with extension to the pancreas head, portal adenopathy, biopsy of duodenal mass-adenocarcinoma;foundation 1-microsatellite stable, tumor mutational burden 1, KRASG12D mutation  ERCP 06/14/2018-distal duodenal bulb mass, 1 cm proximal to the ampulla causing biliary obstruction, status post placement of a metal common bile duct stent  CT chest 06/20/2018-bilateral pulmonary nodules consistent with metastatic disease, supraclavicular, mediastinal, and hilar lymphadenopathy, changes of pancreatitis  Ultrasound-guided biopsy of a left supraclavicular lymph node 06/22/2018-metastatic adenocarcinoma  Cycle 1 FOLFOX 07/03/2018  Cycle 2 FOLFOX 07/17/2018  Cycle 3 FOLFOX 08/06/2018, Neulasta added  Cycle 4 FOLFOX 08/21/2018  CTs 09/06/2018-numerous small lung nodules generally decreased in size; new rounded masslike subpleural opacity right upper lobe with some evidence of internal cavitation,measuring 4.0 cm. Minimal fat stranding about the pancreas and duodenum which is decreased. Discrete mass is not noted. Previously seen common bile duct stent absent. Gallbladder wall thickening with faintly calcified gallstones. No biliary ductal dilatation.  Cycle 5 FOLFOX 09/10/2018(oxaliplatin dose reduceddue to elevated liver enzymes)  Cycle 6 FOLFOX 09/24/2018(oxaliplatin further dose reduced due to elevated liver enzymes)  Cycle 7 FOLFOX 10/09/2018  Cycle 8 FOLFOX 10/22/2018  CTs 11/12/2018-continued improvement in lung metastases.  Maintenance Xeloda, daily dosing 11/25/2018  CTs 02/04/2019-decrease in bilateral lung nodules, many have resolved, no evidence of disease progression, no evidence of metastatic disease to the abdomen or pelvis  Xeloda continued  Xeloda placed  on hold 02/26/2019 due to hand-foot syndrome, resumed 10/21/2020at a dose of 1000 mg twice daily  05/13/2019-mixed response of  (increased size of left upper lobe nodule, decreased size of right lower lobe nodule), new "shotty "abdominal retroperitoneal lymph nodes, new tiny nodules in the left abdominal omentum  Elevated CEA 05/13/2019  CTs12/28/2020-increased size of the left upper lobe nodule, decreased right lower lobe nodule, new small retroperitoneal nodes, new subcentimeter nodules in the left omental fat  Xeloda continued  Xeloda placed on hold 06/28/2019  CTs 07/09/2019-when enlarged pulmonary nodules, new right hilar and pretracheal lymph nodes, enlargement of retroperitoneal lymph nodes, increased omental nodularity and stranding, gallbladder wall thickening-new, increase in ascites  Cycle 1 FOLFIRI 07/15/2019 2.Biliary obstruction secondary to the duodenal mass, status post placement of a bile duct stent 06/14/2018 3.Post ERCP pancreatitis 06/16/2018 4.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 Abdominal pain-secondary to the small bowel carcinoma versus pancreatitis  9. Rash-improved 10. Neutropenia secondary to chemotherapy 07/31/2018 11.Elevated transaminases;improved 09/07/2018; improved 09/10/2018. Question related to chemotherapy. 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  She completed 1 cycle of FOLFIRI.  She tolerated the chemotherapy well, but she has developed neutropenia.  She will call for a fever or symptoms of infection.  Chemotherapy will be held today and Neulasta will be added with cycle 2 FOLFIRI.  The hoarseness is likely related to laryngeal nerve involvement by mediastinal lymphadenopathy.  We will make an ENT referral if the hoarseness persists.  She will return for an office visit and chemotherapy in  1 week.  I refilled her prescription for hydromorphone.  She has lost weight over the past month.  She will meet with the Cancer center nutritionist today.  Betsy Coder, MD  07/29/2019  10:44 AM

## 2019-07-31 ENCOUNTER — Other Ambulatory Visit: Payer: Self-pay | Admitting: Oncology

## 2019-08-02 ENCOUNTER — Inpatient Hospital Stay: Payer: 59 | Admitting: General Practice

## 2019-08-02 ENCOUNTER — Telehealth: Payer: Self-pay | Admitting: General Practice

## 2019-08-02 NOTE — Telephone Encounter (Signed)
[facility] CSW Progress Notes  Unable to reach patient for today's appointment via WebEx.  Called and left VM to reschedule at her convenience.  Edwyna Shell, LCSW Clinical Social Worker Phone:  [phone]

## 2019-08-03 ENCOUNTER — Other Ambulatory Visit: Payer: Self-pay | Admitting: Oncology

## 2019-08-05 ENCOUNTER — Other Ambulatory Visit: Payer: Self-pay | Admitting: *Deleted

## 2019-08-05 DIAGNOSIS — C170 Malignant neoplasm of duodenum: Secondary | ICD-10-CM

## 2019-08-06 ENCOUNTER — Encounter: Payer: Self-pay | Admitting: Nurse Practitioner

## 2019-08-06 ENCOUNTER — Other Ambulatory Visit: Payer: Self-pay

## 2019-08-06 ENCOUNTER — Inpatient Hospital Stay: Payer: 59

## 2019-08-06 ENCOUNTER — Inpatient Hospital Stay (HOSPITAL_BASED_OUTPATIENT_CLINIC_OR_DEPARTMENT_OTHER): Payer: 59 | Admitting: Nurse Practitioner

## 2019-08-06 VITALS — BP 137/88 | HR 108 | Temp 98.0°F | Resp 17 | Ht 71.0 in | Wt 218.2 lb

## 2019-08-06 DIAGNOSIS — K858 Other acute pancreatitis without necrosis or infection: Secondary | ICD-10-CM | POA: Diagnosis not present

## 2019-08-06 DIAGNOSIS — Z95828 Presence of other vascular implants and grafts: Secondary | ICD-10-CM

## 2019-08-06 DIAGNOSIS — C170 Malignant neoplasm of duodenum: Secondary | ICD-10-CM

## 2019-08-06 DIAGNOSIS — Z5111 Encounter for antineoplastic chemotherapy: Secondary | ICD-10-CM | POA: Diagnosis not present

## 2019-08-06 LAB — CBC WITH DIFFERENTIAL (CANCER CENTER ONLY)
Abs Immature Granulocytes: 0.02 10*3/uL (ref 0.00–0.07)
Basophils Absolute: 0 10*3/uL (ref 0.0–0.1)
Basophils Relative: 1 %
Eosinophils Absolute: 0.1 10*3/uL (ref 0.0–0.5)
Eosinophils Relative: 1 %
HCT: 37.5 % (ref 36.0–46.0)
Hemoglobin: 12.7 g/dL (ref 12.0–15.0)
Immature Granulocytes: 0 %
Lymphocytes Relative: 20 %
Lymphs Abs: 1 10*3/uL (ref 0.7–4.0)
MCH: 33.7 pg (ref 26.0–34.0)
MCHC: 33.9 g/dL (ref 30.0–36.0)
MCV: 99.5 fL (ref 80.0–100.0)
Monocytes Absolute: 0.8 10*3/uL (ref 0.1–1.0)
Monocytes Relative: 15 %
Neutro Abs: 3.2 10*3/uL (ref 1.7–7.7)
Neutrophils Relative %: 63 %
Platelet Count: 383 10*3/uL (ref 150–400)
RBC: 3.77 MIL/uL — ABNORMAL LOW (ref 3.87–5.11)
RDW: 13.8 % (ref 11.5–15.5)
WBC Count: 5.1 10*3/uL (ref 4.0–10.5)
nRBC: 0 % (ref 0.0–0.2)

## 2019-08-06 LAB — CMP (CANCER CENTER ONLY)
ALT: 31 U/L (ref 0–44)
AST: 30 U/L (ref 15–41)
Albumin: 3.8 g/dL (ref 3.5–5.0)
Alkaline Phosphatase: 138 U/L — ABNORMAL HIGH (ref 38–126)
Anion gap: 13 (ref 5–15)
BUN: 10 mg/dL (ref 6–20)
CO2: 28 mmol/L (ref 22–32)
Calcium: 9 mg/dL (ref 8.9–10.3)
Chloride: 95 mmol/L — ABNORMAL LOW (ref 98–111)
Creatinine: 0.79 mg/dL (ref 0.44–1.00)
GFR, Est AFR Am: >60 mL/min (ref >60)
GFR, Estimated: >60 mL/min (ref >60)
Glucose, Bld: 131 mg/dL — ABNORMAL HIGH (ref 70–99)
Potassium: 3.4 mmol/L — ABNORMAL LOW (ref 3.5–5.1)
Sodium: 136 mmol/L (ref 135–145)
Total Bilirubin: 0.6 mg/dL (ref 0.3–1.2)
Total Protein: 7 g/dL (ref 6.5–8.1)

## 2019-08-06 LAB — CEA (IN HOUSE-CHCC): CEA (CHCC-In House): 129.66 ng/mL — ABNORMAL HIGH (ref 0.00–5.00)

## 2019-08-06 MED ORDER — HYDROMORPHONE HCL 4 MG PO TABS: 4.0000 mg | ORAL_TABLET | Freq: Three times a day (TID) | ORAL | 0 refills | Status: DC | PRN

## 2019-08-06 MED ORDER — DEXAMETHASONE SODIUM PHOSPHATE 10 MG/ML IJ SOLN
10.0000 mg | Freq: Once | INTRAMUSCULAR | Status: AC
  Administered 2019-08-06: 10 mg via INTRAVENOUS

## 2019-08-06 MED ORDER — FLUOROURACIL CHEMO INJECTION 2.5 GM/50ML
400.0000 mg/m2 | Freq: Once | INTRAVENOUS | Status: AC
  Administered 2019-08-06: 900 mg via INTRAVENOUS
  Filled 2019-08-06: qty 18

## 2019-08-06 MED ORDER — SODIUM CHLORIDE 0.9 % IV SOLN
180.0000 mg/m2 | Freq: Once | INTRAVENOUS | Status: AC
  Administered 2019-08-06: 400 mg via INTRAVENOUS
  Filled 2019-08-06: qty 15

## 2019-08-06 MED ORDER — SODIUM CHLORIDE 0.9 % IV SOLN
2400.0000 mg/m2 | INTRAVENOUS | Status: DC
  Administered 2019-08-06: 5400 mg via INTRAVENOUS
  Filled 2019-08-06: qty 108

## 2019-08-06 MED ORDER — ATROPINE SULFATE 1 MG/ML IJ SOLN
INTRAMUSCULAR | Status: AC
  Filled 2019-08-06: qty 1

## 2019-08-06 MED ORDER — PALONOSETRON HCL INJECTION 0.25 MG/5ML
0.2500 mg | Freq: Once | INTRAVENOUS | Status: AC
  Administered 2019-08-06: 0.25 mg via INTRAVENOUS

## 2019-08-06 MED ORDER — PALONOSETRON HCL INJECTION 0.25 MG/5ML
INTRAVENOUS | Status: AC
  Filled 2019-08-06: qty 5

## 2019-08-06 MED ORDER — LORAZEPAM 0.5 MG PO TABS: ORAL_TABLET | ORAL | 0 refills | Status: DC

## 2019-08-06 MED ORDER — DEXAMETHASONE SODIUM PHOSPHATE 10 MG/ML IJ SOLN
INTRAMUSCULAR | Status: AC
  Filled 2019-08-06: qty 1

## 2019-08-06 MED ORDER — SODIUM CHLORIDE 0.9 % IV SOLN
400.0000 mg/m2 | Freq: Once | INTRAVENOUS | Status: AC
  Administered 2019-08-06: 900 mg via INTRAVENOUS
  Filled 2019-08-06: qty 45

## 2019-08-06 MED ORDER — SODIUM CHLORIDE 0.9% FLUSH
10.0000 mL | Freq: Once | INTRAVENOUS | Status: AC
  Administered 2019-08-06: 10 mL
  Filled 2019-08-06: qty 10

## 2019-08-06 MED ORDER — ATROPINE SULFATE 1 MG/ML IJ SOLN
0.5000 mg | Freq: Once | INTRAMUSCULAR | Status: AC | PRN
  Administered 2019-08-06: 0.5 mg via INTRAVENOUS

## 2019-08-06 MED ORDER — SODIUM CHLORIDE 0.9 % IV SOLN
Freq: Once | INTRAVENOUS | Status: AC
  Filled 2019-08-06: qty 250

## 2019-08-06 MED ORDER — SODIUM CHLORIDE 0.9 % IV SOLN
150.0000 mg | Freq: Once | INTRAVENOUS | Status: AC
  Administered 2019-08-06: 150 mg via INTRAVENOUS
  Filled 2019-08-06: qty 150

## 2019-08-06 MED ORDER — HYDROXYZINE HCL 25 MG PO TABS: 25.0000 mg | ORAL_TABLET | Freq: Three times a day (TID) | ORAL | 1 refills | Status: DC | PRN

## 2019-08-06 NOTE — Progress Notes (Signed)
[facility] OFFICE PROGRESS NOTE   Diagnosis: Small bowel carcinoma  INTERVAL HISTORY:   Hailey Houston returns as scheduled.  She completed cycle 1 FOLFIRI 07/15/2019.  Cycle 2 was held on 07/29/2019 due to neutropenia.  She reports persistent hoarseness.  She feels bloated, abdomen is "tight".  She has intermittent nausea.  No diarrhea.  She periodically feels hot.  No fever.  No shortness of breath or cough.  No chest pain.  She notes some itching over the abdomen.  She feels some pressure when attempting to urinate.  No dysuria.  Objective:  Vital signs in last 24 hours:  Blood pressure 137/88, pulse (!) 108, temperature 98 F (36.7 C), temperature source Temporal, resp. rate 17, height 5' 11"  (1.803 m), weight 218 lb 3.2 oz (99 kg), SpO2 96 %.    HEENT: No thrush or ulcers. GI: Abdomen is soft, mild tenderness right upper abdomen.  No hepatomegaly.  Question ascites. Vascular: No leg edema.  Calves soft and nontender. Neuro: Alert and oriented. Skin: Palms without erythema. Port-A-Cath without erythema.   Lab Results:  Lab Results  Component Value Date   WBC 5.1 08/06/2019   HGB 12.7 08/06/2019   HCT 37.5 08/06/2019   MCV 99.5 08/06/2019   PLT 383 08/06/2019   NEUTROABS 3.2 08/06/2019    Imaging:  No results found.  Medications: I have reviewed the patient's current medications.  Assessment/Plan: 1.Adenocarcinoma of the duodenum  CT abdomen/pelvis 06/16/2018-changes of pancreatitis, fullness at the head of the pancreas without a defined mass, multiple subcentimeter peripancreatic lymph nodes, distal common bile duct stent, multiple nodules at the lung bases  EUS 06/14/2018-ulcerated mucosa/mass at the distal duodenal bulb with extension to the pancreas head, portal adenopathy, biopsy of duodenal mass-adenocarcinoma;foundation 1-microsatellite stable, tumor mutational burden 1, KRASG12D mutation  ERCP 06/14/2018-distal duodenal bulb mass, 1 cm proximal  to the ampulla causing biliary obstruction, status post placement of a metal common bile duct stent  CT chest 06/20/2018-bilateral pulmonary nodules consistent with metastatic disease, supraclavicular, mediastinal, and hilar lymphadenopathy, changes of pancreatitis  Ultrasound-guided biopsy of a left supraclavicular lymph node 06/22/2018-metastatic adenocarcinoma  Cycle 1 FOLFOX 07/03/2018  Cycle 2 FOLFOX 07/17/2018  Cycle 3 FOLFOX 08/06/2018, Neulasta added  Cycle 4 FOLFOX 08/21/2018  CTs 09/06/2018-numerous small lung nodules generally decreased in size; new rounded masslike subpleural opacity right upper lobe with some evidence of internal cavitation,measuring 4.0 cm. Minimal fat stranding about the pancreas and duodenum which is decreased. Discrete mass is not noted. Previously seen common bile duct stent absent. Gallbladder wall thickening with faintly calcified gallstones. No biliary ductal dilatation.  Cycle 5 FOLFOX 09/10/2018(oxaliplatin dose reduceddue to elevated liver enzymes)  Cycle 6 FOLFOX 09/24/2018(oxaliplatin further dose reduced due to elevated liver enzymes)  Cycle 7 FOLFOX 10/09/2018  Cycle 8 FOLFOX 10/22/2018  CTs 11/12/2018-continued improvement in lung metastases.  Maintenance Xeloda, daily dosing 11/25/2018  CTs 02/04/2019-decrease in bilateral lung nodules, many have resolved, no evidence of disease progression, no evidence of metastatic disease to the abdomen or pelvis  Xeloda continued  Xeloda placed on hold 02/26/2019 due to hand-foot syndrome, resumed 10/21/2020at a dose of 1000 mg twice daily  05/13/2019-mixed response of (increased size of left upper lobe nodule, decreased size of right lower lobe nodule), new "shotty "abdominal retroperitoneal lymph nodes, new tiny nodules in the left abdominal omentum  Elevated CEA 05/13/2019  CTs12/28/2020-increased size of the left upper lobe nodule, decreased right lower lobe nodule, new small retroperitoneal  nodes, new subcentimeter nodules in  the left omental fat  Xeloda continued  Xeloda placed on hold 06/28/2019  CTs 07/09/2019-when enlarged pulmonary nodules, new right hilar and pretracheal lymph nodes, enlargement of retroperitoneal lymph nodes, increased omental nodularity and stranding, gallbladder wall thickening-new, increase in ascites  Cycle 1 FOLFIRI 07/15/2019  Cycle 2 FOLFIRI 08/06/2019, Neulasta 2.Biliary obstruction secondary to the duodenal mass, status post placement of a bile duct stent 06/14/2018 3.Post ERCP pancreatitis 06/16/2018 4.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 Abdominal pain-secondary to the small bowel carcinoma versus pancreatitis  9. Rash-improved 10. Neutropenia secondary to chemotherapy 07/31/2018 11.Elevated transaminases;improved 09/07/2018; improved 09/10/2018. Question related to chemotherapy. 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  Neutropenia secondary to chemotherapy   Disposition: Hailey Houston appears unchanged.  She has completed 1 cycle of FOLFIRI.  Cycle 2 was held last week due to neutropenia.  The neutrophil count has recovered.  Plan to proceed with cycle 2 FOLFIRI today as scheduled.  She will receive Neulasta on the day of pump discontinuation.  She has persistent hoarseness.  Hold on ENT referral for now.  She may have ascites.  We are referring her for an ultrasound to evaluate.  Plan for paracentesis if there is a significant amount of fluid.  Urinalysis/culture to evaluate urinary complaints.  She will return for lab, follow-up, cycle 3 FOLFIRI in 2 weeks.  She will contact the office in the interim with any problems.  Plan reviewed with Dr. Benay Spice.  Ned Card ANP/GNP-BC   08/06/2019  9:42 AM

## 2019-08-06 NOTE — Patient Instructions (Signed)
COVID-19 Vaccine Information can be found at: [email or web address] For questions related to vaccine distribution or appointments, please email [email or web address] or call [phone].   [facility] Discharge Instructions for Patients Receiving Chemotherapy  Today you received the following chemotherapy agents: Irinotecan (Camptosar), Leucovorin, Fluorouracil (Adrucil, 5-FU)  To help prevent nausea and vomiting after your treatment, we encourage you to take your nausea medication as directed by your provider.   If you develop nausea and vomiting that is not controlled by your nausea medication, call the clinic.   BELOW ARE SYMPTOMS THAT SHOULD BE REPORTED IMMEDIATELY:  *FEVER GREATER THAN 100.5 F  *CHILLS WITH OR WITHOUT FEVER  NAUSEA AND VOMITING THAT IS NOT CONTROLLED WITH YOUR NAUSEA MEDICATION  *UNUSUAL SHORTNESS OF BREATH  *UNUSUAL BRUISING OR BLEEDING  TENDERNESS IN MOUTH AND THROAT WITH OR WITHOUT PRESENCE OF ULCERS  *URINARY PROBLEMS  *BOWEL PROBLEMS  UNUSUAL RASH Items with * indicate a potential emergency and should be followed up as soon as possible.  Feel free to call the clinic should you have any questions or concerns. The clinic phone number is [phone].  Please show the [facility] at check-in to the Emergency Department and triage nurse.  Coronavirus (COVID-19) Are you at risk?  Are you at risk for the Coronavirus (COVID-19)?  To be considered HIGH RISK for Coronavirus (COVID-19), you have to meet the following criteria:  . Traveled to [place], [place], [place], [place] or [place]; or in the [place] to [place], [place], [place], or [place]; and have fever, cough, and shortness of breath within the last 2 weeks of travel OR . Been in close contact with a person diagnosed with COVID-19 within the last 2 weeks and have fever, cough, and shortness of breath . IF  YOU DO NOT MEET THESE CRITERIA, YOU ARE CONSIDERED LOW RISK FOR COVID-19.  What to do if you are HIGH RISK for COVID-19?  Marland Kitchen If you are having a medical emergency, call 911. . Seek medical care right away. Before you go to a doctor's office, urgent care or emergency department, call ahead and tell them about your recent travel, contact with someone diagnosed with COVID-19, and your symptoms. You should receive instructions from your physician's office regarding next steps of care.  . When you arrive at healthcare provider, tell the healthcare staff immediately you have returned from visiting [place], [place], [place], [place] or [place]; or traveled in the [place] to [place], [place], [place], or [place]; in the last two weeks or you have been in close contact with a person diagnosed with COVID-19 in the last 2 weeks.   . Tell the health care staff about your symptoms: fever, cough and shortness of breath. . After you have been seen by a medical provider, you will be either: o Tested for (COVID-19) and discharged home on quarantine except to seek medical care if symptoms worsen, and asked to  - Stay home and avoid contact with others until you get your results (4-5 days)  - Avoid travel on public transportation if possible (such as bus, train, or airplane) or o Sent to the Emergency Department by EMS for evaluation, COVID-19 testing, and possible admission depending on your condition and test results.  What to do if you are LOW RISK for COVID-19?  Reduce your risk of any infection by using the same precautions used for avoiding the common cold or flu:  Marland Kitchen Wash your hands often with soap and  warm water for at least 20 seconds.  If soap and water are not readily available, use an alcohol-based hand sanitizer with at least 60% alcohol.  . If coughing or sneezing, cover your mouth and nose by coughing or sneezing into the elbow areas of your shirt or coat, into a tissue or into your sleeve  (not your hands). . Avoid shaking hands with others and consider head nods or verbal greetings only. . Avoid touching your eyes, nose, or mouth with unwashed hands.  . Avoid close contact with people who are sick. . Avoid places or events with large numbers of people in one location, like concerts or sporting events. . Carefully consider travel plans you have or are making. . If you are planning any travel outside or inside the [place], visit the CDC's Travelers' Health webpage for the latest health notices. . If you have some symptoms but not all symptoms, continue to monitor at home and seek medical attention if your symptoms worsen. . If you are having a medical emergency, call 911.   [place] / e-Visit: [email or web address]         [facility]: [facility]: [id number]                   [facility]: [phone]

## 2019-08-06 NOTE — Progress Notes (Signed)
Per Hailey Houston: ok to treat with elevated heart rate

## 2019-08-07 ENCOUNTER — Telehealth: Payer: Self-pay | Admitting: Oncology

## 2019-08-07 NOTE — Telephone Encounter (Signed)
Scheduled per los. Called and left msg. Mailed printout  °

## 2019-08-08 ENCOUNTER — Other Ambulatory Visit: Payer: Self-pay | Admitting: Nurse Practitioner

## 2019-08-08 ENCOUNTER — Inpatient Hospital Stay: Payer: 59

## 2019-08-08 ENCOUNTER — Telehealth: Payer: Self-pay | Admitting: *Deleted

## 2019-08-08 ENCOUNTER — Other Ambulatory Visit: Payer: Self-pay

## 2019-08-08 ENCOUNTER — Ambulatory Visit (HOSPITAL_COMMUNITY): Payer: 59

## 2019-08-08 VITALS — BP 124/73 | Temp 97.9°F | Resp 18

## 2019-08-08 DIAGNOSIS — Z5111 Encounter for antineoplastic chemotherapy: Secondary | ICD-10-CM | POA: Diagnosis not present

## 2019-08-08 DIAGNOSIS — C170 Malignant neoplasm of duodenum: Secondary | ICD-10-CM

## 2019-08-08 DIAGNOSIS — Z95828 Presence of other vascular implants and grafts: Secondary | ICD-10-CM

## 2019-08-08 LAB — URINALYSIS, COMPLETE (UACMP) WITH MICROSCOPIC
Bilirubin Urine: NEGATIVE
Glucose, UA: NEGATIVE mg/dL
Ketones, ur: NEGATIVE mg/dL
Nitrite: NEGATIVE
Protein, ur: 100 mg/dL — AB
Specific Gravity, Urine: 1.024 (ref 1.005–1.030)
WBC, UA: >50 WBC/hpf — ABNORMAL HIGH (ref 0–5)
pH: 5 (ref 5.0–8.0)

## 2019-08-08 MED ORDER — PEGFILGRASTIM INJECTION 6 MG/0.6ML ~~LOC~~
PREFILLED_SYRINGE | SUBCUTANEOUS | Status: AC
  Filled 2019-08-08: qty 0.6

## 2019-08-08 MED ORDER — SODIUM CHLORIDE 0.9% FLUSH
10.0000 mL | Freq: Once | INTRAVENOUS | Status: AC
  Administered 2019-08-08: 10 mL
  Filled 2019-08-08: qty 10

## 2019-08-08 MED ORDER — HEPARIN SOD (PORK) LOCK FLUSH 100 UNIT/ML IV SOLN
500.0000 [IU] | Freq: Once | INTRAVENOUS | Status: AC
  Administered 2019-08-08: 500 [IU]
  Filled 2019-08-08: qty 5

## 2019-08-08 MED ORDER — CIPROFLOXACIN HCL 500 MG PO TABS: 500.0000 mg | ORAL_TABLET | Freq: Two times a day (BID) | ORAL | 0 refills | Status: AC

## 2019-08-08 MED ORDER — PEGFILGRASTIM INJECTION 6 MG/0.6ML ~~LOC~~
6.0000 mg | PREFILLED_SYRINGE | Freq: Once | SUBCUTANEOUS | Status: AC
  Administered 2019-08-08: 6 mg via SUBCUTANEOUS

## 2019-08-08 NOTE — Telephone Encounter (Signed)
-----   Message from Owens Shark, NP sent at 08/08/2019 11:40 AM EDT ----- Please let her know the urinalysis is consistent with an infection.  I am sending a prescription to her pharmacy for ciprofloxacin.  We will follow-up on the urine culture.

## 2019-08-08 NOTE — Patient Instructions (Addendum)
Pegfilgrastim injection What is this medicine? PEGFILGRASTIM (PEG fil gra stim) is a long-acting granulocyte colony-stimulating factor that stimulates the growth of neutrophils, a type of white blood cell important in the body's fight against infection. It is used to reduce the incidence of fever and infection in patients with certain types of cancer who are receiving chemotherapy that affects the bone marrow, and to increase survival after being exposed to high doses of radiation. This medicine may be used for other purposes; ask your health care provider or pharmacist if you have questions. COMMON BRAND NAME(S): Fulphila, Neulasta, UDENYCA, Ziextenzo What should I tell my health care provider before I take this medicine? They need to know if you have any of these conditions:  kidney disease  latex allergy  ongoing radiation therapy  sickle cell disease  skin reactions to acrylic adhesives (On-Body Injector only)  an unusual or allergic reaction to pegfilgrastim, filgrastim, other medicines, foods, dyes, or preservatives  pregnant or trying to get pregnant  breast-feeding How should I use this medicine? This medicine is for injection under the skin. If you get this medicine at home, you will be taught how to prepare and give the pre-filled syringe or how to use the On-body Injector. Refer to the patient Instructions for Use for detailed instructions. Use exactly as directed. Tell your healthcare provider immediately if you suspect that the On-body Injector may not have performed as intended or if you suspect the use of the On-body Injector resulted in a missed or partial dose. It is important that you put your used needles and syringes in a special sharps container. Do not put them in a trash can. If you do not have a sharps container, call your pharmacist or healthcare provider to get one. Talk to your pediatrician regarding the use of this medicine in children. While this drug may be  prescribed for selected conditions, precautions do apply. Overdosage: If you think you have taken too much of this medicine contact a poison control center or emergency room at once. NOTE: This medicine is only for you. Do not share this medicine with others. What if I miss a dose? It is important not to miss your dose. Call your doctor or health care professional if you miss your dose. If you miss a dose due to an On-body Injector failure or leakage, a new dose should be administered as soon as possible using a single prefilled syringe for manual use. What may interact with this medicine? Interactions have not been studied. Give your health care provider a list of all the medicines, herbs, non-prescription drugs, or dietary supplements you use. Also tell them if you smoke, drink alcohol, or use illegal drugs. Some items may interact with your medicine. This list may not describe all possible interactions. Give your health care provider a list of all the medicines, herbs, non-prescription drugs, or dietary supplements you use. Also tell them if you smoke, drink alcohol, or use illegal drugs. Some items may interact with your medicine. What should I watch for while using this medicine? You may need blood work done while you are taking this medicine. If you are going to need a MRI, CT scan, or other procedure, tell your doctor that you are using this medicine (On-Body Injector only). What side effects may I notice from receiving this medicine? Side effects that you should report to your doctor or health care professional as soon as possible:  allergic reactions like skin rash, itching or hives, swelling of the   face, lips, or tongue  back pain  dizziness  fever  pain, redness, or irritation at site where injected  pinpoint red spots on the skin  red or dark-brown urine  shortness of breath or breathing problems  stomach or side pain, or pain at the  shoulder  swelling  tiredness  trouble passing urine or change in the amount of urine Side effects that usually do not require medical attention (report to your doctor or health care professional if they continue or are bothersome):  bone pain  muscle pain This list may not describe all possible side effects. Call your doctor for medical advice about side effects. You may report side effects to FDA at 1-800-FDA-1088. Where should I keep my medicine? Keep out of the reach of children. If you are using this medicine at home, you will be instructed on how to store it. Throw away any unused medicine after the expiration date on the label. NOTE: This sheet is a summary. It may not cover all possible information. If you have questions about this medicine, talk to your doctor, pharmacist, or health care provider.  2020 Elsevier/Gold Standard (2017-08-07 16:57:08)   Central Line, Adult A central line is a thin, flexible tube (catheter) that is put in your vein. It can be used to:  Give you medicine.  Give you food and nutrients. Follow these instructions at home: Caring for the tube   Follow instructions from your doctor about: ? Flushing the tube with saline solution. ? Cleaning the tube and the area around it.  Only flush with clean (sterile) supplies. The supplies should be from your doctor, a pharmacy, or another place that your doctor recommends.  Before you flush the tube or clean the area around the tube: ? Wash your hands with soap and water. If you cannot use soap and water, use hand sanitizer. ? Clean the central line hub with rubbing alcohol. Caring for your skin  Keep the area where the tube was put in clean and dry.  Every day, and when changing the bandage, check the skin around the central line for: ? Redness, swelling, or pain. ? Fluid or blood. ? Warmth. ? Pus. ? A bad smell. General instructions  Keep the tube clamped, unless it is being used.  Keep  your supplies in a clean, dry location.  If you or someone else accidentally pulls on the tube, make sure: ? The bandage (dressing) is okay. ? There is no bleeding. ? The tube has not been pulled out.  Do not use scissors or sharp objects near the tube.  Do not swim or let the tube soak in a tub.  Ask your doctor what activities are safe for you. Your doctor may tell you not to lift anything or move your arm too much.  Take over-the-counter and prescription medicines only as told by your doctor.  Change bandages as told by your doctor.  Keep your bandage dry. If a bandage gets wet, have it changed right away.  Keep all follow-up visits as told by your doctor. This is important. Throwing away supplies  Throw away any syringes in a trash (disposal) container that is only for sharp items (sharps container). You can buy a sharps container from a pharmacy, or you can make one by using an empty hard plastic bottle with a cover.  Place any used bandages or infusion bags into a plastic bag. Throw that bag in the trash. Contact a doctor if:  You have any   of these where the tube was put in: ? Redness, swelling, or pain. ? Fluid or blood. ? A warm feeling. ? Pus or a bad smell. Get help right away if:  You have: ? A fever. ? Chills. ? Trouble getting enough air (shortness of breath). ? Trouble breathing. ? Pain in your chest. ? Swelling in your neck, face, chest, or arm.  You are coughing.  You feel your heart beating fast or skipping beats.  You feel dizzy or you pass out (faint).  There are red lines coming from where the tube was put in.  The area where the tube was put in is bleeding and the bleeding will not stop.  Your tube is hard to flush.  You do not get a blood return from the tube.  The tube gets loose or comes out.  The tube has a hole or a tear.  The tube leaks. Summary  A central line is a thin, flexible tube (catheter) that is put in your vein. It  can be used to take blood for lab tests or to give you medicine.  Follow instructions from your doctor about flushing and cleaning the tube.  Keep the area where the tube was put in clean and dry.  Ask your doctor what activities are safe for you. This information is not intended to replace advice given to you by your health care provider. Make sure you discuss any questions you have with your health care provider. Document Revised: 08/22/2018 Document Reviewed: 05/19/2016 Elsevier Patient Education  2020 Elsevier Inc.   

## 2019-08-08 NOTE — Telephone Encounter (Signed)
Per Ned Card, NP, called to make pt aware the urinalysis was consistent with an infection. Prescription was sent to her pharmacy for Ciprofloxacin. Will f/u on urine culture. Voice message was left on pt personal cell. Advised to call office with any other questions or concerns

## 2019-08-09 LAB — URINE CULTURE: Culture: NO GROWTH

## 2019-08-14 NOTE — Progress Notes (Signed)
Pharmacist Chemotherapy Monitoring - Follow Up Assessment    I verify that I have reviewed each item in the below checklist:  . Regimen for the patient is scheduled for the appropriate day and plan matches scheduled date. Marland Kitchen Appropriate non-routine labs are ordered dependent on drug ordered. . If applicable, additional medications reviewed and ordered per protocol based on lifetime cumulative doses and/or treatment regimen.   Plan for follow-up and/or issues identified: No . I-vent associated with next due treatment: No . MD and/or nursing notified: No  Hailey Houston K 08/14/2019 11:10 AM

## 2019-08-15 ENCOUNTER — Other Ambulatory Visit: Payer: Self-pay

## 2019-08-15 ENCOUNTER — Telehealth: Payer: Self-pay

## 2019-08-15 ENCOUNTER — Other Ambulatory Visit: Payer: Self-pay | Admitting: Nurse Practitioner

## 2019-08-15 ENCOUNTER — Ambulatory Visit (HOSPITAL_COMMUNITY)
Admission: RE | Admit: 2019-08-15 | Discharge: 2019-08-15 | Disposition: A | Discharge status: Home / Self Care | Payer: 59 | Source: Ambulatory Visit | Attending: Nurse Practitioner | Admitting: Nurse Practitioner

## 2019-08-15 DIAGNOSIS — C170 Malignant neoplasm of duodenum: Secondary | ICD-10-CM

## 2019-08-15 NOTE — Telephone Encounter (Signed)
Was forwarded voicemail from Ivor in triage from [facility] ultrasound stating that patient was there to receive an ultrasound this morning but they were unsure about which Ultrasound Lattie Haw NP wanted done based on her note. They did not know if she wanted a full complete ultrasound or just an evaluation for ascites. By the time I called them back to let them know that I would ask Dr Benay Spice of what was needed for patient since Lattie Haw was not in today, they had already done just the ultrasound to evaluate for acities. Left note on Lisa's desk to make her aware of situation.

## 2019-08-15 NOTE — Telephone Encounter (Signed)
Spoke with kim from ultrasound [id number]) regarding ultrasound this morning for patient.

## 2019-08-18 ENCOUNTER — Other Ambulatory Visit: Payer: Self-pay | Admitting: Oncology

## 2019-08-20 ENCOUNTER — Inpatient Hospital Stay: Payer: 59

## 2019-08-20 ENCOUNTER — Other Ambulatory Visit: Payer: Self-pay

## 2019-08-20 ENCOUNTER — Inpatient Hospital Stay: Payer: 59 | Attending: Oncology | Admitting: Oncology

## 2019-08-20 ENCOUNTER — Other Ambulatory Visit: Payer: Self-pay | Admitting: *Deleted

## 2019-08-20 VITALS — BP 141/97 | HR 127 | Temp 98.3°F | Resp 20 | Ht 71.0 in | Wt 215.2 lb

## 2019-08-20 VITALS — HR 111

## 2019-08-20 DIAGNOSIS — C770 Secondary and unspecified malignant neoplasm of lymph nodes of head, face and neck: Secondary | ICD-10-CM | POA: Diagnosis not present

## 2019-08-20 DIAGNOSIS — D701 Agranulocytosis secondary to cancer chemotherapy: Secondary | ICD-10-CM | POA: Insufficient documentation

## 2019-08-20 DIAGNOSIS — K8021 Calculus of gallbladder without cholecystitis with obstruction: Secondary | ICD-10-CM | POA: Diagnosis not present

## 2019-08-20 DIAGNOSIS — C170 Malignant neoplasm of duodenum: Secondary | ICD-10-CM

## 2019-08-20 DIAGNOSIS — Z5189 Encounter for other specified aftercare: Secondary | ICD-10-CM | POA: Diagnosis not present

## 2019-08-20 DIAGNOSIS — Z452 Encounter for adjustment and management of vascular access device: Secondary | ICD-10-CM | POA: Insufficient documentation

## 2019-08-20 DIAGNOSIS — D649 Anemia, unspecified: Secondary | ICD-10-CM | POA: Insufficient documentation

## 2019-08-20 DIAGNOSIS — G620 Drug-induced polyneuropathy: Secondary | ICD-10-CM | POA: Insufficient documentation

## 2019-08-20 DIAGNOSIS — Z5111 Encounter for antineoplastic chemotherapy: Secondary | ICD-10-CM | POA: Diagnosis not present

## 2019-08-20 DIAGNOSIS — Z95828 Presence of other vascular implants and grafts: Secondary | ICD-10-CM

## 2019-08-20 DIAGNOSIS — C7800 Secondary malignant neoplasm of unspecified lung: Secondary | ICD-10-CM | POA: Insufficient documentation

## 2019-08-20 LAB — CMP (CANCER CENTER ONLY)
ALT: 30 U/L (ref 0–44)
AST: 25 U/L (ref 15–41)
Albumin: 3.8 g/dL (ref 3.5–5.0)
Alkaline Phosphatase: 154 U/L — ABNORMAL HIGH (ref 38–126)
Anion gap: 11 (ref 5–15)
BUN: 8 mg/dL (ref 6–20)
CO2: 29 mmol/L (ref 22–32)
Calcium: 9.3 mg/dL (ref 8.9–10.3)
Chloride: 97 mmol/L — ABNORMAL LOW (ref 98–111)
Creatinine: 0.81 mg/dL (ref 0.44–1.00)
GFR, Est AFR Am: >60 mL/min (ref >60)
GFR, Estimated: >60 mL/min (ref >60)
Glucose, Bld: 135 mg/dL — ABNORMAL HIGH (ref 70–99)
Potassium: 3.3 mmol/L — ABNORMAL LOW (ref 3.5–5.1)
Sodium: 137 mmol/L (ref 135–145)
Total Bilirubin: 0.4 mg/dL (ref 0.3–1.2)
Total Protein: 7.3 g/dL (ref 6.5–8.1)

## 2019-08-20 LAB — CBC WITH DIFFERENTIAL (CANCER CENTER ONLY)
Abs Immature Granulocytes: 0.47 10*3/uL — ABNORMAL HIGH (ref 0.00–0.07)
Basophils Absolute: 0.1 10*3/uL (ref 0.0–0.1)
Basophils Relative: 1 %
Eosinophils Absolute: 0.2 10*3/uL (ref 0.0–0.5)
Eosinophils Relative: 2 %
HCT: 37.8 % (ref 36.0–46.0)
Hemoglobin: 12.4 g/dL (ref 12.0–15.0)
Immature Granulocytes: 4 %
Lymphocytes Relative: 16 %
Lymphs Abs: 1.8 10*3/uL (ref 0.7–4.0)
MCH: 31.9 pg (ref 26.0–34.0)
MCHC: 32.8 g/dL (ref 30.0–36.0)
MCV: 97.2 fL (ref 80.0–100.0)
Monocytes Absolute: 0.9 10*3/uL (ref 0.1–1.0)
Monocytes Relative: 8 %
Neutro Abs: 8 10*3/uL — ABNORMAL HIGH (ref 1.7–7.7)
Neutrophils Relative %: 69 %
Platelet Count: 320 10*3/uL (ref 150–400)
RBC: 3.89 MIL/uL (ref 3.87–5.11)
RDW: 14.9 % (ref 11.5–15.5)
WBC Count: 11.4 10*3/uL — ABNORMAL HIGH (ref 4.0–10.5)
nRBC: 0.2 % (ref 0.0–0.2)

## 2019-08-20 MED ORDER — ATROPINE SULFATE 1 MG/ML IJ SOLN
0.5000 mg | Freq: Once | INTRAMUSCULAR | Status: AC | PRN
  Administered 2019-08-20: 0.5 mg via INTRAVENOUS

## 2019-08-20 MED ORDER — SODIUM CHLORIDE 0.9 % IV SOLN
Freq: Once | INTRAVENOUS | Status: AC
  Filled 2019-08-20: qty 250

## 2019-08-20 MED ORDER — SODIUM CHLORIDE 0.9 % IV SOLN
150.0000 mg | Freq: Once | INTRAVENOUS | Status: AC
  Administered 2019-08-20: 150 mg via INTRAVENOUS
  Filled 2019-08-20: qty 150

## 2019-08-20 MED ORDER — HEPARIN SOD (PORK) LOCK FLUSH 100 UNIT/ML IV SOLN
500.0000 [IU] | Freq: Once | INTRAVENOUS | Status: DC
  Filled 2019-08-20: qty 5

## 2019-08-20 MED ORDER — DEXAMETHASONE 4 MG PO TABS: ORAL_TABLET | ORAL | 1 refills | Status: DC

## 2019-08-20 MED ORDER — SODIUM CHLORIDE 0.9 % IV SOLN
10.0000 mg | Freq: Once | INTRAVENOUS | Status: AC
  Administered 2019-08-20: 10 mg via INTRAVENOUS
  Filled 2019-08-20: qty 10

## 2019-08-20 MED ORDER — ATROPINE SULFATE 1 MG/ML IJ SOLN
INTRAMUSCULAR | Status: AC
  Filled 2019-08-20: qty 1

## 2019-08-20 MED ORDER — LORAZEPAM 0.5 MG PO TABS: ORAL_TABLET | ORAL | 0 refills | Status: DC

## 2019-08-20 MED ORDER — ESCITALOPRAM OXALATE 5 MG PO TABS: 5.0000 mg | ORAL_TABLET | Freq: Every day | ORAL | 0 refills | Status: DC

## 2019-08-20 MED ORDER — PALONOSETRON HCL INJECTION 0.25 MG/5ML
0.2500 mg | Freq: Once | INTRAVENOUS | Status: AC
  Administered 2019-08-20: 0.25 mg via INTRAVENOUS

## 2019-08-20 MED ORDER — FLUOROURACIL CHEMO INJECTION 2.5 GM/50ML
400.0000 mg/m2 | Freq: Once | INTRAVENOUS | Status: AC
  Administered 2019-08-20: 900 mg via INTRAVENOUS
  Filled 2019-08-20: qty 18

## 2019-08-20 MED ORDER — SODIUM CHLORIDE 0.9 % IV SOLN
400.0000 mg/m2 | Freq: Once | INTRAVENOUS | Status: AC
  Administered 2019-08-20: 884 mg via INTRAVENOUS
  Filled 2019-08-20: qty 44.2

## 2019-08-20 MED ORDER — SODIUM CHLORIDE 0.9 % IV SOLN
180.0000 mg/m2 | Freq: Once | INTRAVENOUS | Status: AC
  Administered 2019-08-20: 400 mg via INTRAVENOUS
  Filled 2019-08-20: qty 15

## 2019-08-20 MED ORDER — PALONOSETRON HCL INJECTION 0.25 MG/5ML
INTRAVENOUS | Status: AC
  Filled 2019-08-20: qty 5

## 2019-08-20 MED ORDER — SODIUM CHLORIDE 0.9 % IV SOLN
2400.0000 mg/m2 | INTRAVENOUS | Status: DC
  Administered 2019-08-20: 5300 mg via INTRAVENOUS
  Filled 2019-08-20: qty 106

## 2019-08-20 MED ORDER — SODIUM CHLORIDE 0.9% FLUSH
10.0000 mL | Freq: Once | INTRAVENOUS | Status: AC
  Administered 2019-08-20: 10 mL
  Filled 2019-08-20: qty 10

## 2019-08-20 NOTE — Patient Instructions (Signed)
[facility] Discharge Instructions for Patients Receiving Chemotherapy  Today you received the following chemotherapy agents: Irinotecan, Leucovorin, 5FU  To help prevent nausea and vomiting after your treatment, we encourage you to take your nausea medication as directed.   If you develop nausea and vomiting that is not controlled by your nausea medication, call the clinic.   BELOW ARE SYMPTOMS THAT SHOULD BE REPORTED IMMEDIATELY:  *FEVER GREATER THAN 100.5 F  *CHILLS WITH OR WITHOUT FEVER  NAUSEA AND VOMITING THAT IS NOT CONTROLLED WITH YOUR NAUSEA MEDICATION  *UNUSUAL SHORTNESS OF BREATH  *UNUSUAL BRUISING OR BLEEDING  TENDERNESS IN MOUTH AND THROAT WITH OR WITHOUT PRESENCE OF ULCERS  *URINARY PROBLEMS  *BOWEL PROBLEMS  UNUSUAL RASH Items with * indicate a potential emergency and should be followed up as soon as possible.  Feel free to call the clinic should you have any questions or concerns. The clinic phone number is [phone].  Please show the CHEMO ALERT CARD at check-in to the Emergency Department and triage nurse.   

## 2019-08-20 NOTE — Progress Notes (Signed)
OK to treat with HR 111.

## 2019-08-20 NOTE — Progress Notes (Signed)
[place] OFFICE PROGRESS NOTE   Diagnosis: Small bowel carcinoma  INTERVAL HISTORY:   Hailey Houston completed another cycle of FOLFIRI on 08/06/2019.  She received G-CSF on day 3.  No pain after the G-CSF injection.  She had nausea lasting 3 to 4 days following chemotherapy.  No emesis.  The nausea is partially relieved with antiemetics.  No diarrhea.  She has abdominal pain.  The pain is partially relieved with Dilaudid.  She had urinary urgency 2 weeks ago.  A urinalysis was consistent with a UTI.  Ciprofloxacin was prescribed, but she did not pick up the prescription.  She continues to have urgency.  Objective:  Vital signs in last 24 hours:  Blood pressure (!) 155/98, pulse (!) 127, temperature 98.3 F (36.8 C), temperature source Oral, resp. rate 20, height 5' 11"  (1.803 m), weight 215 lb 3.2 oz (97.6 kg), SpO2 97 %.    HEENT: No thrush or ulcers Resp: Decreased breath sounds at the right lower posterior chest, no respiratory distress Cardio: Regular rate and rhythm, tachycardia GI: Soft, no hepatosplenomegaly, tender in the upper and lower abdomen, no mass Vascular: No leg edema, the left lower leg is slightly larger than the right side   Portacath/PICC-without erythema  Lab Results:  Lab Results  Component Value Date   WBC 11.4 (H) 08/20/2019   HGB 12.4 08/20/2019   HCT 37.8 08/20/2019   MCV 97.2 08/20/2019   PLT 320 08/20/2019   NEUTROABS 8.0 (H) 08/20/2019    CMP  Lab Results  Component Value Date   NA 136 08/06/2019   K 3.4 (L) 08/06/2019   CL 95 (L) 08/06/2019   CO2 28 08/06/2019   GLUCOSE 131 (H) 08/06/2019   BUN 10 08/06/2019   CREATININE 0.79 08/06/2019   CALCIUM 9.0 08/06/2019   PROT 7.0 08/06/2019   ALBUMIN 3.8 08/06/2019   AST 30 08/06/2019   ALT 31 08/06/2019   ALKPHOS 138 (H) 08/06/2019   BILITOT 0.6 08/06/2019   GFRNONAA >60 08/06/2019   GFRAA >60 08/06/2019    Lab Results  Component Value Date   CEA1 129.66 (H)  08/06/2019     Medications: I have reviewed the patient's current medications.   Assessment/Plan: .Adenocarcinoma of the duodenum  CT abdomen/pelvis 06/16/2018-changes of pancreatitis, fullness at the head of the pancreas without a defined mass, multiple subcentimeter peripancreatic lymph nodes, distal common bile duct stent, multiple nodules at the lung bases  EUS 06/14/2018-ulcerated mucosa/mass at the distal duodenal bulb with extension to the pancreas head, portal adenopathy, biopsy of duodenal mass-adenocarcinoma;foundation 1-microsatellite stable, tumor mutational burden 1, [id number] mutation  ERCP 06/14/2018-distal duodenal bulb mass, 1 cm proximal to the ampulla causing biliary obstruction, status post placement of a metal common bile duct stent  CT chest 06/20/2018-bilateral pulmonary nodules consistent with metastatic disease, supraclavicular, mediastinal, and hilar lymphadenopathy, changes of pancreatitis  Ultrasound-guided biopsy of a left supraclavicular lymph node 06/22/2018-metastatic adenocarcinoma  Cycle 1 FOLFOX 07/03/2018  Cycle 2 FOLFOX 07/17/2018  Cycle 3 FOLFOX 08/06/2018, Neulasta added  Cycle 4 FOLFOX 08/21/2018  CTs 09/06/2018-numerous small lung nodules generally decreased in size; new rounded masslike subpleural opacity right upper lobe with some evidence of internal cavitation,measuring 4.0 cm. Minimal fat stranding about the pancreas and duodenum which is decreased. Discrete mass is not noted. Previously seen common bile duct stent absent. Gallbladder wall thickening with faintly calcified gallstones. No biliary ductal dilatation.  Cycle 5 FOLFOX 09/10/2018(oxaliplatin dose reduceddue to elevated liver enzymes)  Cycle 6 FOLFOX  09/24/2018(oxaliplatin further dose reduced due to elevated liver enzymes)  Cycle 7 FOLFOX 10/09/2018  Cycle 8 FOLFOX 10/22/2018  CTs 11/12/2018-continued improvement in lung metastases.  Maintenance Xeloda, daily dosing  11/25/2018  CTs 02/04/2019-decrease in bilateral lung nodules, many have resolved, no evidence of disease progression, no evidence of metastatic disease to the abdomen or pelvis  Xeloda continued  Xeloda placed on hold 02/26/2019 due to hand-foot syndrome, resumed 10/21/2020at a dose of 1000 mg twice daily  05/13/2019-mixed response of (increased size of left upper lobe nodule, decreased size of right lower lobe nodule), new "shotty "abdominal retroperitoneal lymph nodes, new tiny nodules in the left abdominal omentum  Elevated CEA 05/13/2019  CTs12/28/2020-increased size of the left upper lobe nodule, decreased right lower lobe nodule, new small retroperitoneal nodes, new subcentimeter nodules in the left omental fat  Xeloda continued  Xeloda placed on hold 06/28/2019  CTs 07/09/2019-when enlarged pulmonary nodules, new right hilar and pretracheal lymph nodes, enlargement of retroperitoneal lymph nodes, increased omental nodularity and stranding, gallbladder wall thickening-new, increase in ascites  Cycle 1 FOLFIRI 07/15/2019  Cycle 2 FOLFIRI 08/06/2019, Neulasta  Cycle 3 FOLFIRI 08/20/2019, Neulasta 2.Biliary obstruction secondary to the duodenal mass, status post placement of a bile duct stent 06/14/2018 3.Post ERCP pancreatitis 06/16/2018 4.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 Abdominal pain-secondary to the small bowel carcinoma versus pancreatitis  9. Rash-improved 10. Neutropenia secondary to chemotherapy 07/31/2018 11.Elevated transaminases;improved 09/07/2018; improved 09/10/2018. Question related to chemotherapy. 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  Neutropenia secondary to chemotherapy    Disposition: [name] has completed 2 cycles of FOLFIRI.  She has nausea following  chemotherapy.  Decadron prophylaxis will be added with this cycle. She continues to have abdominal pain.  The pain is secondary to small bowel carcinoma.  A urinary tract infection may be contributing.  She will complete the course of ciprofloxacin that was prescribed on 08/08/2019.  She will continue Dilaudid as needed for pain.  She will return for an office visit and chemotherapy in 2 weeks.  We will check the CEA when she returns in 2 weeks.  She has tachycardia today.  She thinks this may be related to taking Adderall and drinking coffee this morning.  We will repeat the heart rate once she is settled in the chemotherapy room today.  She does not appear dehydrated.  I have a low clinical suspicion for a primary cardiopulmonary process.  She is due to receive the second COVID-19 vaccine on 09/03/2019.  Betsy Coder, MD  08/20/2019  10:37 AM

## 2019-08-21 ENCOUNTER — Other Ambulatory Visit: Payer: Self-pay | Admitting: Oncology

## 2019-08-22 ENCOUNTER — Telehealth: Payer: Self-pay | Admitting: Oncology

## 2019-08-22 ENCOUNTER — Other Ambulatory Visit: Payer: Self-pay | Admitting: Oncology

## 2019-08-22 ENCOUNTER — Inpatient Hospital Stay: Payer: 59

## 2019-08-22 ENCOUNTER — Other Ambulatory Visit: Payer: Self-pay

## 2019-08-22 VITALS — BP 141/79 | HR 98 | Temp 99.1°F | Resp 20

## 2019-08-22 DIAGNOSIS — C170 Malignant neoplasm of duodenum: Secondary | ICD-10-CM

## 2019-08-22 DIAGNOSIS — Z5111 Encounter for antineoplastic chemotherapy: Secondary | ICD-10-CM | POA: Diagnosis not present

## 2019-08-22 MED ORDER — SODIUM CHLORIDE 0.9% FLUSH
10.0000 mL | INTRAVENOUS | Status: DC | PRN
  Administered 2019-08-22: 10 mL
  Filled 2019-08-22: qty 10

## 2019-08-22 MED ORDER — PEGFILGRASTIM INJECTION 6 MG/0.6ML ~~LOC~~
PREFILLED_SYRINGE | SUBCUTANEOUS | Status: AC
  Filled 2019-08-22: qty 0.6

## 2019-08-22 MED ORDER — HEPARIN SOD (PORK) LOCK FLUSH 100 UNIT/ML IV SOLN
500.0000 [IU] | Freq: Once | INTRAVENOUS | Status: AC | PRN
  Administered 2019-08-22: 500 [IU]
  Filled 2019-08-22: qty 5

## 2019-08-22 MED ORDER — PEGFILGRASTIM INJECTION 6 MG/0.6ML ~~LOC~~
6.0000 mg | PREFILLED_SYRINGE | Freq: Once | SUBCUTANEOUS | Status: AC
  Administered 2019-08-22: 6 mg via SUBCUTANEOUS

## 2019-08-22 NOTE — Telephone Encounter (Signed)
Scheduled appt per 4/6 los.  Left a vm of the appt date and time.

## 2019-08-22 NOTE — Patient Instructions (Signed)

## 2019-08-24 ENCOUNTER — Emergency Department (HOSPITAL_COMMUNITY): Payer: 59

## 2019-08-24 ENCOUNTER — Emergency Department (HOSPITAL_COMMUNITY)
Admission: EM | Admit: 2019-08-24 | Discharge: 2019-08-24 | Disposition: A | Discharge status: Home / Self Care | Payer: 59 | Attending: Emergency Medicine | Admitting: Emergency Medicine

## 2019-08-24 ENCOUNTER — Encounter (HOSPITAL_COMMUNITY): Payer: Self-pay | Admitting: *Deleted

## 2019-08-24 ENCOUNTER — Other Ambulatory Visit: Payer: Self-pay

## 2019-08-24 DIAGNOSIS — C170 Malignant neoplasm of duodenum: Secondary | ICD-10-CM | POA: Insufficient documentation

## 2019-08-24 DIAGNOSIS — E860 Dehydration: Secondary | ICD-10-CM

## 2019-08-24 DIAGNOSIS — T458X5A Adverse effect of other primarily systemic and hematological agents, initial encounter: Secondary | ICD-10-CM | POA: Diagnosis not present

## 2019-08-24 DIAGNOSIS — Z79899 Other long term (current) drug therapy: Secondary | ICD-10-CM | POA: Diagnosis not present

## 2019-08-24 DIAGNOSIS — D72828 Other elevated white blood cell count: Secondary | ICD-10-CM | POA: Diagnosis not present

## 2019-08-24 DIAGNOSIS — R531 Weakness: Secondary | ICD-10-CM | POA: Diagnosis present

## 2019-08-24 DIAGNOSIS — K8020 Calculus of gallbladder without cholecystitis without obstruction: Secondary | ICD-10-CM | POA: Diagnosis not present

## 2019-08-24 DIAGNOSIS — R0609 Other forms of dyspnea: Secondary | ICD-10-CM | POA: Diagnosis not present

## 2019-08-24 DIAGNOSIS — F17200 Nicotine dependence, unspecified, uncomplicated: Secondary | ICD-10-CM | POA: Insufficient documentation

## 2019-08-24 LAB — CBC WITH DIFFERENTIAL/PLATELET
Abs Immature Granulocytes: 18.52 10*3/uL — ABNORMAL HIGH (ref 0.00–0.07)
Basophils Absolute: 0 10*3/uL (ref 0.0–0.1)
Basophils Relative: 0 %
Eosinophils Absolute: 0 10*3/uL (ref 0.0–0.5)
Eosinophils Relative: 0 %
HCT: 38.7 % (ref 36.0–46.0)
Hemoglobin: 13 g/dL (ref 12.0–15.0)
Immature Granulocytes: 32 %
Lymphocytes Relative: 1 %
Lymphs Abs: 0.6 10*3/uL — ABNORMAL LOW (ref 0.7–4.0)
MCH: 32.3 pg (ref 26.0–34.0)
MCHC: 33.6 g/dL (ref 30.0–36.0)
MCV: 96 fL (ref 80.0–100.0)
Monocytes Absolute: 0.1 10*3/uL (ref 0.1–1.0)
Monocytes Relative: 0 %
Neutro Abs: 38.5 10*3/uL — ABNORMAL HIGH (ref 1.7–7.7)
Neutrophils Relative %: 67 %
Platelets: 274 10*3/uL (ref 150–400)
RBC: 4.03 MIL/uL (ref 3.87–5.11)
RDW: 15.4 % (ref 11.5–15.5)
WBC: 57.6 10*3/uL (ref 4.0–10.5)
nRBC: 0 % (ref 0.0–0.2)

## 2019-08-24 LAB — URINALYSIS, ROUTINE W REFLEX MICROSCOPIC
Bilirubin Urine: NEGATIVE
Glucose, UA: NEGATIVE mg/dL
Hgb urine dipstick: NEGATIVE
Ketones, ur: NEGATIVE mg/dL
Leukocytes,Ua: NEGATIVE
Nitrite: NEGATIVE
Protein, ur: NEGATIVE mg/dL
Specific Gravity, Urine: 1.003 — ABNORMAL LOW (ref 1.005–1.030)
pH: 6 (ref 5.0–8.0)

## 2019-08-24 LAB — COMPREHENSIVE METABOLIC PANEL
ALT: 40 U/L (ref 0–44)
AST: 39 U/L (ref 15–41)
Albumin: 4 g/dL (ref 3.5–5.0)
Alkaline Phosphatase: 156 U/L — ABNORMAL HIGH (ref 38–126)
Anion gap: 13 (ref 5–15)
BUN: 21 mg/dL — ABNORMAL HIGH (ref 6–20)
CO2: 22 mmol/L (ref 22–32)
Calcium: 9.2 mg/dL (ref 8.9–10.3)
Chloride: 99 mmol/L (ref 98–111)
Creatinine, Ser: 0.8 mg/dL (ref 0.44–1.00)
GFR calc Af Amer: >60 mL/min (ref >60)
GFR calc non Af Amer: >60 mL/min (ref >60)
Glucose, Bld: 182 mg/dL — ABNORMAL HIGH (ref 70–99)
Potassium: 3.7 mmol/L (ref 3.5–5.1)
Sodium: 134 mmol/L — ABNORMAL LOW (ref 135–145)
Total Bilirubin: 0.9 mg/dL (ref 0.3–1.2)
Total Protein: 7.2 g/dL (ref 6.5–8.1)

## 2019-08-24 LAB — D-DIMER, QUANTITATIVE: D-Dimer, Quant: 6.43 ug/mL-FEU — ABNORMAL HIGH (ref 0.00–0.50)

## 2019-08-24 MED ORDER — ONDANSETRON 8 MG PO TBDP: 8.0000 mg | ORAL_TABLET | Freq: Three times a day (TID) | ORAL | 0 refills | Status: DC | PRN

## 2019-08-24 MED ORDER — ONDANSETRON HCL 4 MG/2ML IJ SOLN
4.0000 mg | Freq: Once | INTRAMUSCULAR | Status: AC
  Administered 2019-08-24: 4 mg via INTRAVENOUS
  Filled 2019-08-24: qty 2

## 2019-08-24 MED ORDER — HYDROMORPHONE HCL 4 MG PO TABS: 4.0000 mg | ORAL_TABLET | Freq: Four times a day (QID) | ORAL | 0 refills | Status: DC | PRN

## 2019-08-24 MED ORDER — SODIUM CHLORIDE 0.9 % IV BOLUS (SEPSIS)
1000.0000 mL | Freq: Once | INTRAVENOUS | Status: AC
  Administered 2019-08-24: 16:00:00 1000 mL via INTRAVENOUS

## 2019-08-24 MED ORDER — HYDROMORPHONE HCL 1 MG/ML IJ SOLN
0.5000 mg | Freq: Once | INTRAMUSCULAR | Status: AC
  Administered 2019-08-24: 14:00:00 0.5 mg via INTRAVENOUS
  Filled 2019-08-24: qty 1

## 2019-08-24 MED ORDER — SODIUM CHLORIDE 0.9 % IV BOLUS
1000.0000 mL | Freq: Once | INTRAVENOUS | Status: AC
  Administered 2019-08-24: 16:00:00 1000 mL via INTRAVENOUS

## 2019-08-24 MED ORDER — HYDROMORPHONE HCL 1 MG/ML IJ SOLN
1.0000 mg | Freq: Once | INTRAMUSCULAR | Status: AC
  Administered 2019-08-24: 20:00:00 1 mg via INTRAVENOUS
  Filled 2019-08-24: qty 1

## 2019-08-24 MED ORDER — HYDROMORPHONE HCL 4 MG PO TABS: 4.0000 mg | ORAL_TABLET | ORAL | 0 refills | Status: DC | PRN

## 2019-08-24 MED ORDER — ONDANSETRON HCL 4 MG/2ML IJ SOLN
4.0000 mg | Freq: Once | INTRAMUSCULAR | Status: AC
  Administered 2019-08-24: 20:00:00 4 mg via INTRAVENOUS
  Filled 2019-08-24: qty 2

## 2019-08-24 MED ORDER — HYDROMORPHONE HCL 1 MG/ML IJ SOLN
1.0000 mg | Freq: Once | INTRAMUSCULAR | Status: AC
  Administered 2019-08-24: 1 mg via INTRAVENOUS
  Filled 2019-08-24: qty 1

## 2019-08-24 MED ORDER — SODIUM CHLORIDE (PF) 0.9 % IJ SOLN
INTRAMUSCULAR | Status: AC
  Filled 2019-08-24: qty 50

## 2019-08-24 MED ORDER — IOHEXOL 350 MG/ML SOLN
100.0000 mL | Freq: Once | INTRAVENOUS | Status: AC | PRN
  Administered 2019-08-24: 15:00:00 100 mL via INTRAVENOUS

## 2019-08-24 NOTE — ED Provider Notes (Signed)
[facility] DEPT Provider Note   CSN: [id number] Arrival date & time: 08/24/19  1324     History Chief Complaint  Patient presents with  . Dizziness  . Weakness  . cancer pt    Hailey Houston is a 49 y.o. female.  Patient with a history of duodenal cancer.  She complains of weakness shortness of breath and some abdominal discomfort.  Patient states that she is out of her pain medicine  The history is provided by the patient. No language interpreter was used.  Weakness Severity:  Mild Onset quality:  Sudden Timing:  Intermittent Progression:  Waxing and waning Chronicity:  New Context: not alcohol use   Relieved by:  Nothing Worsened by:  Nothing Ineffective treatments:  None tried Associated symptoms: shortness of breath   Associated symptoms: no abdominal pain, no chest pain, no cough, no diarrhea, no frequency, no headaches and no seizures        Past Medical History:  Diagnosis Date  . ADD (attention deficit disorder)   . 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 Anxiety   . Family history of adverse reaction to anesthesia    mom goes into afib  . Wears glasses    driving    Patient Active Problem List   Diagnosis Date Noted  . Port-A-Cath in place 07/09/2018  . Goals of care, counseling/discussion 06/26/2018  . Epigastric pain   . Malnutrition of moderate degree 06/22/2018  . 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 Pancreatitis 06/16/2018  . Hypokalemia 06/16/2018  . Dehydration 06/16/2018  . Nausea and vomiting 06/16/2018  . Acute pancreatitis 06/16/2018  . Pulmonary nodules 06/16/2018  . Abnormal CT scan   . Common bile duct (CBD) stricture   . Dilation of biliary tract   . Duodenal mass   . Attention deficit hyperactivity disorder (ADHD) 05/27/2018  . Tibia fracture 08/29/2013    Past Surgical History:  Procedure Laterality Date  . BILIARY STENT PLACEMENT N/A 06/14/2018   Procedure: BILIARY STENT  PLACEMENT;  Surgeon: Milus Banister, MD;  Location: [facility];  Service: Endoscopy;  Laterality: N/A;  . BIOPSY  06/14/2018   Procedure: BIOPSY;  Surgeon: Milus Banister, MD;  Location: [facility];  Service: Endoscopy;;  . ENDOSCOPIC RETROGRADE CHOLANGIOPANCREATOGRAPHY (ERCP) WITH PROPOFOL N/A 06/14/2018   Procedure: ENDOSCOPIC RETROGRADE CHOLANGIOPANCREATOGRAPHY (ERCP) WITH PROPOFOL;  Surgeon: Milus Banister, MD;  Location: [facility];  Service: Endoscopy;  Laterality: N/A;  . ESOPHAGOGASTRODUODENOSCOPY N/A 06/14/2018   Procedure: ESOPHAGOGASTRODUODENOSCOPY (EGD);  Surgeon: Milus Banister, MD;  Location: [facility];  Service: Endoscopy;  Laterality: N/A;  . EUS N/A 06/14/2018   Procedure: UPPER ENDOSCOPIC ULTRASOUND (EUS) RADIAL;  Surgeon: Milus Banister, MD;  Location: [facility];  Service: Endoscopy;  Laterality: N/A;  . IR IMAGING GUIDED PORT INSERTION  07/06/2018  . ORIF ANKLE FRACTURE Left 08/29/2013   Procedure: LEFT FIBULA -FRACTURE OPEN TREATMENT DISTAL FIBULAR (LATERALERAL MALLEOLUS) INCLUDES INTERNAL FIXATION, ;  Surgeon: [name], MD;  Location: [facility];  Service: Orthopedics;  Laterality: Left;  . PERINEAL LACERATION REPAIR     post childbirth  . SPHINCTEROTOMY  06/14/2018   Procedure: SPHINCTEROTOMY;  Surgeon: Milus Banister, MD;  Location: [facility];  Service: Endoscopy;;  . TIBIA IM NAIL INSERTION Left 08/29/2013   Procedure: FRACTURE TREATMENT TIBIAL SHAFT BY INTRAMEDULLARY IMPLANT WITH SCREWS ;  Surgeon: [name], MD;  Location: [facility];  Service: Orthopedics;  Laterality: Left;  OB History   No obstetric history on file.     Family History  Problem Relation Age of Onset  . Heart disease Mother   . Heart disease Father     Social History   Tobacco Use  . Smoking status: Current Some Day Smoker  . Smokeless tobacco: Never Used  Substance Use Topics  . Alcohol use: Yes    Comment: occ  .  Drug use: No    Home Medications Prior to Admission medications   Medication Sig Start Date End Date Taking? Authorizing Provider  amphetamine-dextroamphetamine (ADDERALL) 30 MG tablet Take 1 tablet by mouth 2 (two) times daily. 07/01/19   Cottle, Billey Co., MD  cetirizine (ZYRTEC) 10 MG tablet Take 10 mg by mouth daily.    [provider]  dexamethasone (DECADRON) 4 MG tablet Take 2 tablets twice a day, start day after chemotherapy. 08/20/19   Ladell Pier, MD  docusate sodium (COLACE) 100 MG capsule Take 2 capsules (200 mg total) by mouth 2 (two) times daily as needed for mild constipation. 07/17/19   Ladell Pier, MD  escitalopram (LEXAPRO) 5 MG tablet Take 1 tablet (5 mg total) by mouth daily. 08/20/19   Ladell Pier, MD  famotidine ([employer] ACID CONTROLLER MAX ST) 20 MG tablet TAKE 1 TABLET BY MOUTH TWICE A DAY *INS PREFERS OTC* 08/22/19   Ladell Pier, MD  HYDROmorphone (DILAUDID) 4 MG tablet Take 1-2 tablets (4-8 mg total) by mouth every 8 (eight) hours as needed for severe pain. 08/06/19   Owens Shark, NP  hydrOXYzine (ATARAX/VISTARIL) 25 MG tablet Take 1 tablet (25 mg total) by mouth every 8 (eight) hours as needed. 08/06/19   Owens Shark, NP  LORazepam (ATIVAN) 0.5 MG tablet TAKE 1 TABLET EVERY 6 HOURS AS NEEDED FOR ANXIETY OR NAUSEA 08/20/19   Ladell Pier, MD  ondansetron (ZOFRAN) 8 MG tablet Take 1 tablet every 8 hours as needed for nausea. Patient not taking: Reported on 08/20/2019 04/09/19   Ladell Pier, MD  pantoprazole (PROTONIX) 40 MG tablet TAKE 1 TABLET BY MOUTH TWICE A DAY 07/24/19   Ladell Pier, MD  Polyethylene Glycol 3350 (MIRALAX PO) Take 17 g by mouth daily as needed.  07/03/18   [provider]  potassium chloride SA (KLOR-CON) 20 MEQ tablet Take 1 tablet (20 mEq total) by mouth daily. 07/09/19   Owens Shark, NP  prochlorperazine (COMPAZINE) 10 MG tablet TAKE 1 TABLET BY MOUTH EVERY 6 HOURS AS NEEDED FOR NAUSEA AND VOMITING 08/05/19    Ladell Pier, MD  zaleplon (SONATA) 10 MG capsule Take 1 capsule (10 mg total) by mouth at bedtime as needed for sleep. 07/01/19   Cottle, Billey Co., MD    Allergies    Oxycodone  Review of Systems   Review of Systems  Constitutional: Negative for appetite change and fatigue.  HENT: Negative for congestion, ear discharge and sinus pressure.   Eyes: Negative for discharge.  Respiratory: Positive for shortness of breath. Negative for cough.   Cardiovascular: Negative for chest pain.  Gastrointestinal: Negative for abdominal pain and diarrhea.  Genitourinary: Negative for frequency and hematuria.  Musculoskeletal: Negative for back pain.  Skin: Negative for rash.  Neurological: Positive for weakness. Negative for seizures and headaches.  Psychiatric/Behavioral: Negative for hallucinations.    Physical Exam Updated Vital Signs BP 109/87   Pulse (!) 110   Temp 98.5 F (36.9 C) (Oral)   Resp 16  SpO2 96%   Physical Exam Vitals and nursing note reviewed.  Constitutional:      Appearance: She is well-developed.  HENT:     Head: Normocephalic.     Nose: Nose normal.  Eyes:     General: No scleral icterus.    Conjunctiva/sclera: Conjunctivae normal.  Neck:     Thyroid: No thyromegaly.  Cardiovascular:     Rate and Rhythm: Normal rate and regular rhythm.     Heart sounds: No murmur. No friction rub. No gallop.   Pulmonary:     Breath sounds: No stridor. No wheezing or rales.  Chest:     Chest wall: No tenderness.  Abdominal:     General: There is no distension.     Tenderness: There is abdominal tenderness. There is no rebound.  Musculoskeletal:        General: Normal range of motion.     Cervical back: Neck supple.  Lymphadenopathy:     Cervical: No cervical adenopathy.  Skin:    Findings: No erythema or rash.  Neurological:     Mental Status: She is oriented to person, place, and time.     Motor: No abnormal muscle tone.     Coordination: Coordination  normal.  Psychiatric:        Behavior: Behavior normal.     ED Results / Procedures / Treatments   Labs (all labs ordered are listed, but only abnormal results are displayed) Labs Reviewed  COMPREHENSIVE METABOLIC PANEL - Abnormal; Notable for the following components:      Result Value   Sodium 134 (*)    Glucose, Bld 182 (*)    BUN 21 (*)    Alkaline Phosphatase 156 (*)    All other components within normal limits  D-DIMER, QUANTITATIVE (NOT AT [facility]) - Abnormal; Notable for the following components:   D-Dimer, Quant 6.43 (*)    All other components within normal limits  URINALYSIS, ROUTINE W REFLEX MICROSCOPIC - Abnormal; Notable for the following components:   Color, Urine STRAW (*)    Specific Gravity, Urine 1.003 (*)    All other components within normal limits  CBC WITH DIFFERENTIAL/PLATELET    EKG None  Radiology DG Chest Port 1 View  Result Date: 08/24/2019 CLINICAL DATA:  History of duodenal cancer with weakness. EXAM: PORTABLE CHEST 1 VIEW COMPARISON:  06/16/2018 FINDINGS: Right-sided Port-A-Cath in place terminating in the mid superior vena cava. Heart size is stable. Right hilar enlargement compatible with nodal disease. No evidence of consolidation or pleural effusion. Pulmonary nodules not well seen on current exam. Visualized skeletal structures are normal. IMPRESSION: Right Port-A-Cath in place, no acute cardiopulmonary disease. Signs of right hilar nodal enlargement. Pulmonary nodule seen to better advantage on the previous CT study. Electronically Signed   By: Zetta Bills M.D.   On: 08/24/2019 14:38    Procedures Procedures (including critical care time)  Medications Ordered in ED Medications  sodium chloride 0.9 % bolus 1,000 mL (has no administration in time range)  ondansetron (ZOFRAN) injection 4 mg (4 mg Intravenous Given 08/24/19 1420)  HYDROmorphone (DILAUDID) injection 0.5 mg (0.5 mg Intravenous Given 08/24/19 1421)    ED Course  I have  reviewed the triage vital signs and the nursing notes.  Pertinent labs & imaging results that were available during my care of the patient were reviewed by me and considered in my medical decision making (see chart for details).    MDM Rules/Calculators/A&P  Patient with weakness mild abdominal discomfort and chest discomfort.  CT angio pending.  Patient will be dispositioned by Dr. Ashok Cordia when results of CT occur Final Clinical Impression(s) / ED Diagnoses Final diagnoses:  None    Rx / DC Orders ED Discharge Orders    None       Milton Ferguson, MD 08/25/19 [phone]

## 2019-08-24 NOTE — ED Provider Notes (Signed)
Signed out by Dr Roderic Palau at 1545 to check ct pe when resulted.  CT neg for PE.  Pt reports general malaise, tiredness, lightheaded when stands, and very poor po intake for 2 days.   Last had chemo 3 days ago, and had neulasta then. Pt reports on chemo for ~ a year, and recent symptoms are worse than has felt previously post chemo. She does note nausea, and very poor po intake, feels dehydrated. Pt denies headache. No change in speech/vision, no focal or unilateral numbness or weakness. No loss in normal functional ability. Denies chest pain. No cough, sore throat or uri symptoms. No abd pain. +nausea. No dysuria - although states still on abx for recent uti.   Oncology consulted - discussed pt, recent chemo/neulasta, labs incl wbc, recent cts, recent u/s, todays ct, vitals, etc, with Dr Alen Blew - he indicates if feels improved post ivf, ok w d/c to home, and can f/u as outpatient.   On recheck pt, continues to deny pain, no headache, no cp, no abd pain. Abd soft, nt. Chest cta.    Iv and po fluids.   Patient/fam indicate pt just ran out of her dilaudid which she takes for chr pain/chr abd pain related to her ca - requests something for pain in ED, and new rx until she can see her doctor.   Recheck abd soft nt. Afebrile. No nv. Vital signs are normal.   Pt currently appears stable for d/c.   Rec close outpt f/u w her ca doctors. Also discussed recent imaging studies incl u/s that showed gallstones.   Return precautions provided.      Lajean Saver, MD 08/24/19 [phone]

## 2019-08-24 NOTE — ED Triage Notes (Signed)
Pt w/ hx of duodenal cancer complains of weakness, nausea, chills, and dizziness for the past 2 days. Pt finished round of chemo 1 week ago. No vomiting or diarrhea. She denies fever.

## 2019-08-24 NOTE — Discharge Instructions (Addendum)
It was our pleasure to provide your ER care today - we hope that you feel better.  We discussed your case with the on-call oncologist for your doctor - follow up with them in the office in the next 2-3 days - call Monday to arrange appointment.   We sent prescription for pain medication (dilaudid) to your pharmacy. Take as needed - no driving for the next 8 hours, or if/when taking that pain medication.   Please note that your recent ultrasound showed gallstones, and thickened gallbladder wall - discuss with your doctors at follow up this week.  Return to ER right away if worse, new symptoms, fevers, worsening, severe or intractable pain, persistent vomiting, or other concern.

## 2019-08-24 NOTE — ED Notes (Addendum)
Pt. Documented in error above note in chart.

## 2019-08-26 ENCOUNTER — Other Ambulatory Visit: Payer: Self-pay

## 2019-08-26 ENCOUNTER — Telehealth: Payer: Self-pay | Admitting: *Deleted

## 2019-08-26 ENCOUNTER — Inpatient Hospital Stay (HOSPITAL_BASED_OUTPATIENT_CLINIC_OR_DEPARTMENT_OTHER): Payer: 59 | Admitting: Nurse Practitioner

## 2019-08-26 ENCOUNTER — Encounter: Payer: Self-pay | Admitting: Nurse Practitioner

## 2019-08-26 VITALS — BP 116/76 | HR 86 | Temp 98.5°F | Resp 20 | Ht 71.0 in | Wt 217.6 lb

## 2019-08-26 DIAGNOSIS — C170 Malignant neoplasm of duodenum: Secondary | ICD-10-CM

## 2019-08-26 DIAGNOSIS — Z5111 Encounter for antineoplastic chemotherapy: Secondary | ICD-10-CM | POA: Diagnosis not present

## 2019-08-26 MED ORDER — MORPHINE SULFATE ER 30 MG PO TBCR: 30.0000 mg | EXTENDED_RELEASE_TABLET | Freq: Two times a day (BID) | ORAL | 0 refills | Status: DC

## 2019-08-26 NOTE — Telephone Encounter (Addendum)
Wants to be seen in office today since going to ER over weekend with abdominal pain and shortness of breath. Still feels short of breath. Abdominal pain controlled w/hydromorphone 4mg . Has not had BM in several days despite MiraLax daily. CT was negative for PE, but MD told her she may have gallstones.  Per Dr. Benay Spice: OV w/NP at 2:15 pm today. Patient notified.

## 2019-08-26 NOTE — Progress Notes (Addendum)
[facility] OFFICE PROGRESS NOTE   Diagnosis: Small bowel carcinoma  INTERVAL HISTORY:   Hailey Houston returns prior to scheduled follow-up.  She completed cycle 3 FOLFIRI 08/20/2019.  She was seen in the emergency department on 08/24/2019 with weakness and shortness of breath abdominal pain.  There was no evidence of pulmonary embolism.  Multiple pulmonary nodules were unchanged.  Right hilar and subcarinal adenopathy increased.  Mild ascites likely worse.  She continues to have abdominal pain.  She is taking 12 mg of Dilaudid 3 times a day with partial short-term relief.  The pain medication makes her feel "woozy".  She has upper and lower abdominal pain.  She continues to have intermittent shortness of breath.  Objective:  Vital signs in last 24 hours:  Blood pressure 116/76, pulse 86, temperature 98.5 F (36.9 C), temperature source Temporal, resp. rate 20, height 5' 11"  (1.803 m), weight 217 lb 9.6 oz (98.7 kg), SpO2 96 %.    HEENT: No thrush or ulcers. Resp: Clear bilaterally. No respiratory distress. Cardio: Regular rate and rhythm. GI: Abdomen is distended. Tender at the right and mid upper abdomen. No hepatomegaly. No mass. Vascular: No leg edema.   Lab Results:  Lab Results  Component Value Date   WBC 57.6 (HH) 08/24/2019   HGB 13.0 08/24/2019   HCT 38.7 08/24/2019   MCV 96.0 08/24/2019   PLT 274 08/24/2019   NEUTROABS 38.5 (H) 08/24/2019    Imaging:  CT Angio Chest PE W and/or Wo Contrast  Result Date: 08/24/2019 CLINICAL DATA:  Shortness of breath. Duodenal cancer. Finished chemotherapy 1 week ago. EXAM: CT ANGIOGRAPHY CHEST WITH CONTRAST TECHNIQUE: Multidetector CT imaging of the chest was performed using the standard protocol during bolus administration of intravenous contrast. Multiplanar CT image reconstructions and MIPs were obtained to evaluate the vascular anatomy. CONTRAST:  166m OMNIPAQUE IOHEXOL 350 MG/ML SOLN COMPARISON:  07/09/2019  FINDINGS: Cardiovascular: Heart is normal size. Thoracic aorta is normal caliber and otherwise unremarkable. Pulmonary arterial system is adequately opacified without evidence of emboli. Remaining vascular structures are unremarkable. Mediastinum/Nodes: Right-sided subcarinal lymph node measuring 1.4 cm by short axis. Right hilar lymph node measuring 1.5 cm by short axis. These are both increased compared to the previous exam. No left hilar adenopathy. Remaining mediastinal structures are unremarkable. Lungs/Pleura: Lungs are adequately inflated. 9 mm cavitary nodule over the anterior left upper lobe which was previously solid and 8 mm. Stable 2-3 mm nodule over the left lower lobe adjacent the major fissure. Two tiny cavitary nodules over the right upper lobe with the larger measuring approximately 3 mm unchanged. Stable 6 mm nodule over the right perihilar region 2 stable small subcentimeter nodules over the right lower lobe. No lobar consolidation or effusion. Airways are normal. Upper Abdomen: There is mild ascites likely worse as this without seen over the upper abdomen on the prior study. Soft tissue nodularity over the omentum in the left anterior upper abdomen with largest nodule measuring 2.7 cm compatible with known metastatic peritoneal disease. Musculoskeletal: Unchanged. Review of the MIP images confirms the above findings. IMPRESSION: 1. No acute cardiopulmonary disease and no evidence of pulmonary embolism. 2. Continued evidence of multiple pulmonary nodules without significant overall change likely due to metastatic disease. Worsening right hilar and subcarinal adenopathy as described. 3. Changes over the upper abdomen as described compatible with known metastatic peritoneal disease. Mild ascites which may be worse. Electronically Signed   By: DMarin OlpM.D.   On: 08/24/2019 15:45  Medications: I have reviewed the patient's current medications.  Assessment/Plan: .Adenocarcinoma of the  duodenum  CT abdomen/pelvis 06/16/2018-changes of pancreatitis, fullness at the head of the pancreas without a defined mass, multiple subcentimeter peripancreatic lymph nodes, distal common bile duct stent, multiple nodules at the lung bases  EUS 06/14/2018-ulcerated mucosa/mass at the distal duodenal bulb with extension to the pancreas head, portal adenopathy, biopsy of duodenal mass-adenocarcinoma;foundation 1-microsatellite stable, tumor mutational burden 1, [id number] mutation  ERCP 06/14/2018-distal duodenal bulb mass, 1 cm proximal to the ampulla causing biliary obstruction, status post placement of a metal common bile duct stent  CT chest 06/20/2018-bilateral pulmonary nodules consistent with metastatic disease, supraclavicular, mediastinal, and hilar lymphadenopathy, changes of pancreatitis  Ultrasound-guided biopsy of a left supraclavicular lymph node 06/22/2018-metastatic adenocarcinoma  Cycle 1 FOLFOX 07/03/2018  Cycle 2 FOLFOX 07/17/2018  Cycle 3 FOLFOX 08/06/2018, Neulasta added  Cycle 4 FOLFOX 08/21/2018  CTs 09/06/2018-numerous small lung nodules generally decreased in size; new rounded masslike subpleural opacity right upper lobe with some evidence of internal cavitation,measuring 4.0 cm. Minimal fat stranding about the pancreas and duodenum which is decreased. Discrete mass is not noted. Previously seen common bile duct stent absent. Gallbladder wall thickening with faintly calcified gallstones. No biliary ductal dilatation.  Cycle 5 FOLFOX 09/10/2018(oxaliplatin dose reduceddue to elevated liver enzymes)  Cycle 6 FOLFOX 09/24/2018(oxaliplatin further dose reduced due to elevated liver enzymes)  Cycle 7 FOLFOX 10/09/2018  Cycle 8 FOLFOX 10/22/2018  CTs 11/12/2018-continued improvement in lung metastases.  Maintenance Xeloda, daily dosing 11/25/2018  CTs 02/04/2019-decrease in bilateral lung nodules, many have resolved, no evidence of disease progression, no evidence of  metastatic disease to the abdomen or pelvis  Xeloda continued  Xeloda placed on hold 02/26/2019 due to hand-foot syndrome, resumed 10/21/2020at a dose of 1000 mg twice daily  05/13/2019-mixed response of (increased size of left upper lobe nodule, decreased size of right lower lobe nodule), new "shotty "abdominal retroperitoneal lymph nodes, new tiny nodules in the left abdominal omentum  Elevated CEA 05/13/2019  CTs12/28/2020-increased size of the left upper lobe nodule, decreased right lower lobe nodule, new small retroperitoneal nodes, new subcentimeter nodules in the left omental fat  Xeloda continued  Xeloda placed on hold 06/28/2019  CTs 07/09/2019-when enlarged pulmonary nodules, new right hilar and pretracheal lymph nodes, enlargement of retroperitoneal lymph nodes, increased omental nodularity and stranding, gallbladder wall thickening-new, increase in ascites  Cycle 1 FOLFIRI 07/15/2019  Cycle 2 FOLFIRI 08/06/2019, Neulasta  Cycle 3 FOLFIRI 08/20/2019, Neulasta 2.Biliary obstruction secondary to the duodenal mass, status post placement of a bile duct stent 06/14/2018 3.Post ERCP pancreatitis 06/16/2018 4.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 Abdominal pain-secondary to the small bowel carcinoma versus pancreatitis  9. Rash-improved 10. Neutropenia secondary to chemotherapy 07/31/2018 11.Elevated transaminases;improved 09/07/2018; improved 09/10/2018. Question related to chemotherapy. 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  She has completed 3 cycles of FOLFIRI.  She was seen in the emergency department over the weekend with shortness of breath and increased abdominal pain.  Chest CT was  negative for PE.  Lung nodules were unchanged, right hilar and subcarinal adenopathy increased.  For pain control she will begin MS Contin 30 mg every 12 hours with continuation of Dilaudid as needed.  She understands she should not drive while taking pain medication.  She will return as  scheduled next week for lab, follow-up and FOLFIRI.  She will contact the office in the interim with any problems.  Patient seen with Dr. Benay Spice.  CT images from 08/24/2019 reviewed with Hailey Houston and her sister.    Ned Card ANP/GNP-BC   08/26/2019  2:49 PM This was a shared visit with Ned Card.  We reviewed CT images and discussed treatment options with Hailey Houston and her sister.  We adjusted the narcotic analgesic regimen.  Her pain has persisted versus worsened since beginning FOLFIRI.  We recommended she schedule a follow-up appointment with Dr. Aleatha Borer to discuss clinical trial options at [facility].  The plan is to continue FOLFIRI for 2 more cycles prior to a restaging CT abdomen/pelvis.  We will check the CEA when she returns next week.  Julieanne Manson, MD

## 2019-08-29 ENCOUNTER — Telehealth: Payer: Self-pay | Admitting: *Deleted

## 2019-08-29 NOTE — Telephone Encounter (Signed)
Called to report her family is concerned that she is sleeping too much. She admits to sleeping about 70% of the day. Asking if this is normal/OK?

## 2019-08-29 NOTE — Progress Notes (Signed)
Pharmacist Chemotherapy Monitoring - Follow Up Assessment    I verify that I have reviewed each item in the below checklist:  . Regimen for the patient is scheduled for the appropriate day and plan matches scheduled date. Marland Kitchen Appropriate non-routine labs are ordered dependent on drug ordered. . If applicable, additional medications reviewed and ordered per protocol based on lifetime cumulative doses and/or treatment regimen.   Plan for follow-up and/or issues identified: No . I-vent associated with next due treatment: No . MD and/or nursing notified: No  Hailey Houston [facility] 08/29/2019 9:13 AM

## 2019-08-30 ENCOUNTER — Other Ambulatory Visit: Payer: Self-pay

## 2019-08-30 ENCOUNTER — Encounter (HOSPITAL_COMMUNITY): Payer: Self-pay | Admitting: *Deleted

## 2019-08-30 ENCOUNTER — Emergency Department (HOSPITAL_COMMUNITY)
Admission: EM | Admit: 2019-08-30 | Discharge: 2019-08-31 | Disposition: A | Discharge status: Home / Self Care | Payer: 59 | Attending: Emergency Medicine | Admitting: Emergency Medicine

## 2019-08-30 ENCOUNTER — Telehealth: Payer: Self-pay | Admitting: *Deleted

## 2019-08-30 DIAGNOSIS — Z85068 Personal history of other malignant neoplasm of small intestine: Secondary | ICD-10-CM | POA: Diagnosis not present

## 2019-08-30 DIAGNOSIS — R1031 Right lower quadrant pain: Secondary | ICD-10-CM | POA: Insufficient documentation

## 2019-08-30 DIAGNOSIS — F17200 Nicotine dependence, unspecified, uncomplicated: Secondary | ICD-10-CM | POA: Diagnosis not present

## 2019-08-30 DIAGNOSIS — R319 Hematuria, unspecified: Secondary | ICD-10-CM | POA: Insufficient documentation

## 2019-08-30 MED ORDER — HYDROMORPHONE HCL 1 MG/ML IJ SOLN
1.0000 mg | Freq: Once | INTRAMUSCULAR | Status: AC
  Administered 2019-08-30: 1 mg via INTRAVENOUS
  Filled 2019-08-30: qty 1

## 2019-08-30 MED ORDER — SODIUM CHLORIDE 0.9% FLUSH: 3.0000 mL | Freq: Once | INTRAVENOUS | Status: DC

## 2019-08-30 NOTE — ED Provider Notes (Signed)
[facility] DEPT Provider Note   CSN: [id number] Arrival date & time: 08/30/19  2151     History Chief Complaint  Patient presents with  . Abdominal Pain    Hailey Houston is a 49 y.o. female.  The history is provided by the patient.  Abdominal Pain Pain location:  RLQ Pain quality: aching   Pain radiates to:  Does not radiate Pain severity:  Severe Onset quality:  Sudden Duration:  3 hours Timing:  Constant Progression:  Waxing and waning Chronicity:  New Relieved by: oral morphine. Worsened by:  Movement and palpation Associated symptoms: hematuria   Associated symptoms: no chest pain, no constipation, no cough, no fever, no nausea, no vaginal bleeding and no vomiting   Patient with metastatic small bowel cancer presents right lower quadrant abdominal pain.  This began approximately 3 hours ago.  She reports that she has never really had this pain before.  She took an oral morphine when the pain started. No fever/vomiting. No change in bowel habits.  No recent flatus. She denies surgical history She is still undergoing chemotherapy     Past Medical History:  Diagnosis Date  . ADD (attention deficit disorder)   . 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 Anxiety   . Family history of adverse reaction to anesthesia    mom goes into afib  . Wears glasses    driving    Patient Active Problem List   Diagnosis Date Noted  . Port-A-Cath in place 07/09/2018  . Goals of care, counseling/discussion 06/26/2018  . Epigastric pain   . Malnutrition of moderate degree 06/22/2018  . 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 Pancreatitis 06/16/2018  . Hypokalemia 06/16/2018  . Dehydration 06/16/2018  . Nausea and vomiting 06/16/2018  . Acute pancreatitis 06/16/2018  . Pulmonary nodules 06/16/2018  . Abnormal CT scan   . Common bile duct (CBD) stricture   . Dilation of biliary tract   . Duodenal mass   . Attention deficit  hyperactivity disorder (ADHD) 05/27/2018  . Tibia fracture 08/29/2013    Past Surgical History:  Procedure Laterality Date  . BILIARY STENT PLACEMENT N/A 06/14/2018   Procedure: BILIARY STENT PLACEMENT;  Surgeon: Milus Banister, MD;  Location: [facility];  Service: Endoscopy;  Laterality: N/A;  . BIOPSY  06/14/2018   Procedure: BIOPSY;  Surgeon: Milus Banister, MD;  Location: [facility];  Service: Endoscopy;;  . ENDOSCOPIC RETROGRADE CHOLANGIOPANCREATOGRAPHY (ERCP) WITH PROPOFOL N/A 06/14/2018   Procedure: ENDOSCOPIC RETROGRADE CHOLANGIOPANCREATOGRAPHY (ERCP) WITH PROPOFOL;  Surgeon: Milus Banister, MD;  Location: [facility];  Service: Endoscopy;  Laterality: N/A;  . ESOPHAGOGASTRODUODENOSCOPY N/A 06/14/2018   Procedure: ESOPHAGOGASTRODUODENOSCOPY (EGD);  Surgeon: Milus Banister, MD;  Location: [facility];  Service: Endoscopy;  Laterality: N/A;  . EUS N/A 06/14/2018   Procedure: UPPER ENDOSCOPIC ULTRASOUND (EUS) RADIAL;  Surgeon: Milus Banister, MD;  Location: [facility];  Service: Endoscopy;  Laterality: N/A;  . IR IMAGING GUIDED PORT INSERTION  07/06/2018  . ORIF ANKLE FRACTURE Left 08/29/2013   Procedure: LEFT FIBULA -FRACTURE OPEN TREATMENT DISTAL FIBULAR (LATERALERAL MALLEOLUS) INCLUDES INTERNAL FIXATION, ;  Surgeon: [name], MD;  Location: [facility];  Service: Orthopedics;  Laterality: Left;  . PERINEAL LACERATION REPAIR     post childbirth  . SPHINCTEROTOMY  06/14/2018   Procedure: SPHINCTEROTOMY;  Surgeon: Milus Banister, MD;  Location: [facility];  Service: Endoscopy;;  . TIBIA IM NAIL INSERTION Left 08/29/2013   Procedure:  FRACTURE TREATMENT TIBIAL SHAFT BY INTRAMEDULLARY IMPLANT WITH SCREWS ;  Surgeon: [name], MD;  Location: [facility];  Service: Orthopedics;  Laterality: Left;     OB History   No obstetric history on file.     Family History  Problem Relation Age of Onset  . Heart disease Mother   . Heart  disease Father     Social History   Tobacco Use  . Smoking status: Current Some Day Smoker  . Smokeless tobacco: Never Used  Substance Use Topics  . Alcohol use: Yes    Comment: occ  . Drug use: No    Home Medications Prior to Admission medications   Medication Sig Start Date End Date Taking? Authorizing Provider  amphetamine-dextroamphetamine (ADDERALL) 30 MG tablet Take 1 tablet by mouth 2 (two) times daily. 07/01/19   Cottle, Billey Co., MD  cetirizine (ZYRTEC) 10 MG tablet Take 10 mg by mouth daily.    [provider]  ciprofloxacin (CIPRO) 500 MG tablet Take 500 mg by mouth 2 (two) times daily.    [provider]  dexamethasone (DECADRON) 4 MG tablet Take 2 tablets twice a day, start day after chemotherapy. 08/20/19   Ladell Pier, MD  docusate sodium (COLACE) 100 MG capsule Take 2 capsules (200 mg total) by mouth 2 (two) times daily as needed for mild constipation. 07/17/19   Ladell Pier, MD  escitalopram (LEXAPRO) 5 MG tablet Take 1 tablet (5 mg total) by mouth daily. 08/20/19   Ladell Pier, MD  famotidine ([employer] ACID CONTROLLER MAX ST) 20 MG tablet TAKE 1 TABLET BY MOUTH TWICE A DAY *INS PREFERS OTC* Patient taking differently: Take 20 mg by mouth 2 (two) times daily. TAKE 1 TABLET BY MOUTH TWICE A DAY *INS PREFERS OTC* 08/22/19   Ladell Pier, MD  HYDROmorphone (DILAUDID) 4 MG tablet Take 1 tablet (4 mg total) by mouth every 6 (six) hours as needed for severe pain. 08/24/19   Milton Ferguson, MD  HYDROmorphone (DILAUDID) 4 MG tablet Take 1 tablet (4 mg total) by mouth every 6 (six) hours as needed for severe pain. 08/24/19   Lajean Saver, MD  hydrOXYzine (ATARAX/VISTARIL) 25 MG tablet Take 1 tablet (25 mg total) by mouth every 8 (eight) hours as needed. 08/06/19   Owens Shark, NP  LORazepam (ATIVAN) 0.5 MG tablet TAKE 1 TABLET EVERY 6 HOURS AS NEEDED FOR ANXIETY OR NAUSEA Patient taking differently: Take 0.5 mg by mouth every 6 (six) hours as needed for  anxiety (nausea). TAKE 1 TABLET EVERY 6 HOURS AS NEEDED FOR ANXIETY OR NAUSEA 08/20/19   Ladell Pier, MD  morphine (MS CONTIN) 30 MG 12 hr tablet Take 1 tablet (30 mg total) by mouth every 12 (twelve) hours. 08/26/19   Owens Shark, NP  ondansetron (ZOFRAN ODT) 8 MG disintegrating tablet Take 1 tablet (8 mg total) by mouth every 8 (eight) hours as needed for nausea or vomiting. 08/24/19   Lajean Saver, MD  ondansetron (ZOFRAN) 8 MG tablet Take 1 tablet every 8 hours as needed for nausea. 04/09/19   Ladell Pier, MD  pantoprazole (PROTONIX) 40 MG tablet TAKE 1 TABLET BY MOUTH TWICE A DAY Patient taking differently: Take 40 mg by mouth 2 (two) times daily.  07/24/19   Ladell Pier, MD  Polyethylene Glycol 3350 (MIRALAX PO) Take 17 g by mouth daily as needed (constipation).  07/03/18   [provider]  potassium chloride SA (  KLOR-CON) 20 MEQ tablet Take 1 tablet (20 mEq total) by mouth daily. 07/09/19   Owens Shark, NP  prochlorperazine (COMPAZINE) 10 MG tablet TAKE 1 TABLET BY MOUTH EVERY 6 HOURS AS NEEDED FOR NAUSEA AND VOMITING Patient taking differently: Take 10 mg by mouth every 6 (six) hours as needed for nausea or vomiting.  08/05/19   Ladell Pier, MD  zaleplon (SONATA) 10 MG capsule Take 1 capsule (10 mg total) by mouth at bedtime as needed for sleep. 07/01/19   Cottle, Billey Co., MD    Allergies    Oxycodone  Review of Systems   Review of Systems  Constitutional: Negative for fever.  Respiratory: Negative for cough.   Cardiovascular: Negative for chest pain.  Gastrointestinal: Positive for abdominal pain. Negative for blood in stool, constipation, nausea and vomiting.  Genitourinary: Positive for hematuria. Negative for vaginal bleeding.  All other systems reviewed and are negative.   Physical Exam Updated Vital Signs BP 110/85 (BP Location: Left Arm)   Pulse (!) 109   Temp 98.3 F (36.8 C) (Oral)   Resp 18   Ht 1.803 m (5\' 11" )   Wt 95.3 kg   SpO2  95%   BMI 29.29 kg/m   Physical Exam CONSTITUTIONAL: Well developed/well nourished, uncomfortable appearing HEAD: Normocephalic/atraumatic EYES: EOMI/PERRL ENMT: Mask in place NECK: supple no meningeal signs SPINE/BACK:entire spine nontender CV: S1/S2 noted, no murmurs/rubs/gallops noted LUNGS: Lungs are clear to auscultation bilaterally, no apparent distress ABDOMEN: soft, moderate RLQ tenderness, no rebound or guarding, decreased bowel sounds noted throughout, no hernia noted GU:no cva tenderness NEURO: Pt is awake/alert/appropriate, moves all extremitiesx4.  No facial droop.  EXTREMITIES: pulses normal/equal, full ROM SKIN: warm, color normal PSYCH: no abnormalities of mood noted, alert and oriented to situation  ED Results / Procedures / Treatments   Labs (all labs ordered are listed, but only abnormal results are displayed) Labs Reviewed  COMPREHENSIVE METABOLIC PANEL - Abnormal; Notable for the following components:      Result Value   Potassium 3.3 (*)    Glucose, Bld 112 (*)    All other components within normal limits  CBC - Abnormal; Notable for the following components:   RBC 3.22 (*)    Hemoglobin 10.0 (*)    HCT 31.4 (*)    RDW 15.9 (*)    All other components within normal limits  URINALYSIS, ROUTINE W REFLEX MICROSCOPIC - Abnormal; Notable for the following components:   Hgb urine dipstick SMALL (*)    Bacteria, UA RARE (*)    All other components within normal limits  LIPASE, BLOOD  I-STAT BETA HCG BLOOD, ED ([facility], [facility], [facility] ONLY)    EKG None  Radiology CT ABDOMEN PELVIS W CONTRAST  Result Date: 08/31/2019 CLINICAL DATA:  Right lower quadrant abdominal pain, history of metastatic duodenal cancer EXAM: CT ABDOMEN AND PELVIS WITH CONTRAST TECHNIQUE: Multidetector CT imaging of the abdomen and pelvis was performed using the standard protocol following bolus administration of intravenous contrast. CONTRAST:  149mL OMNIPAQUE IOHEXOL 300 MG/ML  SOLN COMPARISON:   07/09/2019, 08/24/2019 FINDINGS: Lower chest: Stable scarring right lung base, otherwise the lung bases are clear. Hepatobiliary: Stable diffuse wall thickening of the gallbladder. No evidence of cholelithiasis. The liver is unremarkable. Pancreas: Unremarkable. No pancreatic ductal dilatation or surrounding inflammatory changes. Spleen: Normal in size without focal abnormality. Adrenals/Urinary Tract: There is a nonobstructing 4 mm calculus upper pole right kidney, stable. No obstructive uropathy. The bladder is decompressed which limits evaluation.  The adrenals are normal. Stomach/Bowel: No bowel obstruction or ileus. Nonspecific wall thickening across the transverse colon is likely due to peristalsis and under distension. The appendix measures up to 6 mm in diameter, with mild periappendiceal fat stranding. The appearance is equivocal for appendicitis. I do not see a discrete duodenal mass. Vascular/Lymphatic: Adenopathy at the level of the SMA reference image 29 measures 14 x 15 mm, stable. Multiple other subcentimeter retroperitoneal lymph nodes are stable. Vascular structures are grossly unremarkable. Reproductive: IUD again seen within the uterus. There are no uterine or adnexal masses. Other: Small amount of free fluid is seen within the right hemiabdomen and pelvis. No free gas. The omental caking seen along the transverse mesocolon is slightly more pronounced, consistent with peritoneal carcinomatosis. Musculoskeletal: No acute or destructive bony lesions. Reconstructed images demonstrate no additional findings. IMPRESSION: 1. Progressive omental caking consistent with peritoneal metastatic disease. 2. Periappendiceal fat stranding, though the appendix remains normal in caliber. Findings are equivocal for appendicitis. Please correlate with physical exam findings and laboratory evaluation. 3. Stable retroperitoneal lymphadenopathy. 4. Small volume ascites. Electronically Signed   By: Randa Ngo M.D.    On: 08/31/2019 01:26    Procedures Procedures   Medications Ordered in ED Medications  sodium chloride flush (NS) 0.9 % injection 3 mL (3 mLs Intravenous Not Given 08/30/19 2218)  sodium chloride (PF) 0.9 % injection (has no administration in time range)  HYDROmorphone (DILAUDID) injection 1 mg (1 mg Intravenous Given 08/30/19 2354)  iohexol (OMNIPAQUE) 300 MG/ML solution 100 mL (100 mLs Intravenous Contrast Given 08/31/19 0059)  HYDROmorphone (DILAUDID) injection 1 mg (1 mg Intravenous Given 08/31/19 0111)  ondansetron (ZOFRAN) injection 4 mg (4 mg Intravenous Given 08/31/19 0230)  amoxicillin-clavulanate (AUGMENTIN) 875-125 MG per tablet 1 tablet (1 tablet Oral Given 08/31/19 0228)  HYDROmorphone (DILAUDID) injection 1 mg (1 mg Intravenous Given 08/31/19 0421)    ED Course  I have reviewed the triage vital signs and the nursing notes.  Pertinent labs/imaging results that were available during my care of the patient were reviewed by me and considered in my medical decision making (see chart for details).    MDM Rules/Calculators/A&P                      11:49 PM Plan for CT imaging due to extensive cancer history.  Patient agreed with plan    This patient presents to the ED for concern of abdominal pain, this involves an extensive number of treatment options, and is a complaint that carries with it a high risk of complications and morbidity.  The differential diagnosis includes appendicitis, small bowel obstruction, bowel perforation, ovarian cyst   Lab Tests:   I Ordered, reviewed, and interpreted labs, which included cbc/cmp/urinalysis  Medicines ordered:   I ordered medication dilaudid  For pain   Imaging Studies ordered:   I ordered imaging studies which included ct abd/pelvis   I independently visualized and interpreted imaging which showed ?periappendiceal fat stranding  Additional history obtained:    Previous records obtained and reviewed   Consultations  Obtained:   I consulted general surgery  and discussed lab and imaging findings  Reevaluation:  After the interventions stated above, I reevaluated the patient and found she is improving.   2:19 AM Patient is improving, but still has right lower quadrant abdominal pain.  Discussed CT scan findings with Dr. Marcello Moores with general surgery.  No convincing signs of acute appendicitis at this time. No need for surgery  However due to immune suppressed status, antibiotics could be started in case this is an early case of appendicitis that has not fully developed on CT Will give oral augmentin 5:23 AM Patient reports her pain is now well controlled.  No vomiting.  No fever.  No elevated white count.  Patient would like to be discharged home.  She was started on oral antibiotics. We discussed strict return precautions.  Patient is appropriate for d/c home.  I doubt acute abdominal emergency at this time.  We discussed strict ER return precautions including abdominal pain that migrates to RLQ, fever >100.43F with repetitive vomiting over next 8-12 hours    Final Clinical Impression(s) / ED Diagnoses Final diagnoses:  Right lower quadrant abdominal pain    Rx / DC Orders ED Discharge Orders         Ordered    amoxicillin-clavulanate (AUGMENTIN) 875-125 MG tablet  2 times daily     08/31/19 0237           Ripley Fraise, MD 08/31/19 [phone]

## 2019-08-30 NOTE — Telephone Encounter (Signed)
Needs refill on hydromorphone 4 mg (only has #6 tablets on hand). Also has noted some blood in her urine today. Dr. Benay Spice notified and said to continue to monitor and can f/u next week. Called pharmacy to f/u on her two scripts for hydromorphone in system. She picked up the # 15 tablets from Dr. Ashok Cordia on 08/25/19. She has #20 tabs ready to pick up that was sent in by Dr. Roderic Palau on 08/24/19. In addition she has a script from Dr. Benay Spice dated 07/29/19 for #60 tablets hydromorphone 4 mg.  Patient notified to pick up her #20 tablets today and then can call pharmacy to activate her 07/29/19 script when she finishes these based on 4/day dosing. Continue to monitor urine. She reports that she has been sleeping less today--thinks she is getting used to the new meds and that she started taking her Adderoll again.

## 2019-08-30 NOTE — ED Triage Notes (Signed)
Onset of RLQ pain about 8:30 tonight, pain continues to increase. No other symptoms with pain. Chemo every other week. Cancer -Duodenal with mets.

## 2019-08-31 ENCOUNTER — Emergency Department (HOSPITAL_COMMUNITY): Payer: 59

## 2019-08-31 ENCOUNTER — Encounter (HOSPITAL_COMMUNITY): Payer: Self-pay | Admitting: Emergency Medicine

## 2019-08-31 LAB — COMPREHENSIVE METABOLIC PANEL
ALT: 33 U/L (ref 0–44)
AST: 27 U/L (ref 15–41)
Albumin: 4 g/dL (ref 3.5–5.0)
Alkaline Phosphatase: 110 U/L (ref 38–126)
Anion gap: 8 (ref 5–15)
BUN: 11 mg/dL (ref 6–20)
CO2: 27 mmol/L (ref 22–32)
Calcium: 8.9 mg/dL (ref 8.9–10.3)
Chloride: 102 mmol/L (ref 98–111)
Creatinine, Ser: 0.75 mg/dL (ref 0.44–1.00)
GFR calc Af Amer: >60 mL/min (ref >60)
GFR calc non Af Amer: >60 mL/min (ref >60)
Glucose, Bld: 112 mg/dL — ABNORMAL HIGH (ref 70–99)
Potassium: 3.3 mmol/L — ABNORMAL LOW (ref 3.5–5.1)
Sodium: 137 mmol/L (ref 135–145)
Total Bilirubin: 0.5 mg/dL (ref 0.3–1.2)
Total Protein: 6.7 g/dL (ref 6.5–8.1)

## 2019-08-31 LAB — CBC
HCT: 31.4 % — ABNORMAL LOW (ref 36.0–46.0)
Hemoglobin: 10 g/dL — ABNORMAL LOW (ref 12.0–15.0)
MCH: 31.1 pg (ref 26.0–34.0)
MCHC: 31.8 g/dL (ref 30.0–36.0)
MCV: 97.5 fL (ref 80.0–100.0)
Platelets: 221 10*3/uL (ref 150–400)
RBC: 3.22 MIL/uL — ABNORMAL LOW (ref 3.87–5.11)
RDW: 15.9 % — ABNORMAL HIGH (ref 11.5–15.5)
WBC: 4.7 10*3/uL (ref 4.0–10.5)
nRBC: 0 % (ref 0.0–0.2)

## 2019-08-31 LAB — I-STAT BETA HCG BLOOD, ED (MC, WL, AP ONLY): I-stat hCG, quantitative: <5 m[IU]/mL (ref <5)

## 2019-08-31 LAB — URINALYSIS, ROUTINE W REFLEX MICROSCOPIC
Bilirubin Urine: NEGATIVE
Glucose, UA: NEGATIVE mg/dL
Ketones, ur: NEGATIVE mg/dL
Leukocytes,Ua: NEGATIVE
Nitrite: NEGATIVE
Protein, ur: NEGATIVE mg/dL
Specific Gravity, Urine: 1.013 (ref 1.005–1.030)
pH: 5 (ref 5.0–8.0)

## 2019-08-31 LAB — LIPASE, BLOOD: Lipase: 18 U/L (ref 11–51)

## 2019-08-31 MED ORDER — AMOXICILLIN-POT CLAVULANATE 875-125 MG PO TABS
1.0000 | ORAL_TABLET | Freq: Once | ORAL | Status: AC
  Administered 2019-08-31: 02:00:00 1 via ORAL
  Filled 2019-08-31: qty 1

## 2019-08-31 MED ORDER — AMOXICILLIN-POT CLAVULANATE 875-125 MG PO TABS: 1.0000 | ORAL_TABLET | Freq: Two times a day (BID) | ORAL | 0 refills | Status: DC

## 2019-08-31 MED ORDER — HYDROMORPHONE HCL 1 MG/ML IJ SOLN
1.0000 mg | Freq: Once | INTRAMUSCULAR | Status: AC
  Administered 2019-08-31: 01:00:00 1 mg via INTRAVENOUS
  Filled 2019-08-31: qty 1

## 2019-08-31 MED ORDER — HYDROMORPHONE HCL 1 MG/ML IJ SOLN
1.0000 mg | Freq: Once | INTRAMUSCULAR | Status: AC
  Administered 2019-08-31: 04:00:00 1 mg via INTRAVENOUS
  Filled 2019-08-31: qty 1

## 2019-08-31 MED ORDER — SODIUM CHLORIDE (PF) 0.9 % IJ SOLN
INTRAMUSCULAR | Status: AC
  Filled 2019-08-31: qty 50

## 2019-08-31 MED ORDER — IOHEXOL 300 MG/ML  SOLN
100.0000 mL | Freq: Once | INTRAMUSCULAR | Status: AC | PRN
  Administered 2019-08-31: 01:00:00 100 mL via INTRAVENOUS

## 2019-08-31 MED ORDER — ONDANSETRON HCL 4 MG/2ML IJ SOLN
4.0000 mg | Freq: Once | INTRAMUSCULAR | Status: AC
  Administered 2019-08-31: 03:00:00 4 mg via INTRAVENOUS
  Filled 2019-08-31: qty 2

## 2019-08-31 NOTE — ED Notes (Signed)
Pt sitting up in bed. Denies any needs. Pt ready for d/c

## 2019-08-31 NOTE — ED Notes (Signed)
Pt sitting up in bed. Full monitor on. Family at bedside. Will continue to monitor. Pt denies any other needs. Pt updated on plan of care.

## 2019-08-31 NOTE — Discharge Instructions (Addendum)

## 2019-09-01 ENCOUNTER — Other Ambulatory Visit: Payer: Self-pay | Admitting: Oncology

## 2019-09-04 ENCOUNTER — Inpatient Hospital Stay (HOSPITAL_BASED_OUTPATIENT_CLINIC_OR_DEPARTMENT_OTHER): Payer: 59 | Admitting: Nurse Practitioner

## 2019-09-04 ENCOUNTER — Telehealth: Payer: Self-pay | Admitting: *Deleted

## 2019-09-04 ENCOUNTER — Inpatient Hospital Stay: Payer: 59

## 2019-09-04 ENCOUNTER — Other Ambulatory Visit: Payer: Self-pay

## 2019-09-04 ENCOUNTER — Encounter: Payer: Self-pay | Admitting: Nurse Practitioner

## 2019-09-04 VITALS — BP 117/76 | HR 100 | Temp 97.8°F | Resp 17 | Ht 71.0 in | Wt 217.1 lb

## 2019-09-04 DIAGNOSIS — C170 Malignant neoplasm of duodenum: Secondary | ICD-10-CM

## 2019-09-04 DIAGNOSIS — Z5111 Encounter for antineoplastic chemotherapy: Secondary | ICD-10-CM | POA: Diagnosis not present

## 2019-09-04 DIAGNOSIS — Z95828 Presence of other vascular implants and grafts: Secondary | ICD-10-CM

## 2019-09-04 LAB — CBC WITH DIFFERENTIAL (CANCER CENTER ONLY)
Abs Immature Granulocytes: 0.3 10*3/uL — ABNORMAL HIGH (ref 0.00–0.07)
Basophils Absolute: 0.1 10*3/uL (ref 0.0–0.1)
Basophils Relative: 1 %
Eosinophils Absolute: 0 10*3/uL (ref 0.0–0.5)
Eosinophils Relative: 0 %
HCT: 34.8 % — ABNORMAL LOW (ref 36.0–46.0)
Hemoglobin: 11.2 g/dL — ABNORMAL LOW (ref 12.0–15.0)
Immature Granulocytes: 3 %
Lymphocytes Relative: 17 %
Lymphs Abs: 1.5 10*3/uL (ref 0.7–4.0)
MCH: 31.3 pg (ref 26.0–34.0)
MCHC: 32.2 g/dL (ref 30.0–36.0)
MCV: 97.2 fL (ref 80.0–100.0)
Monocytes Absolute: 0.7 10*3/uL (ref 0.1–1.0)
Monocytes Relative: 8 %
Neutro Abs: 6.3 10*3/uL (ref 1.7–7.7)
Neutrophils Relative %: 71 %
Platelet Count: 266 10*3/uL (ref 150–400)
RBC: 3.58 MIL/uL — ABNORMAL LOW (ref 3.87–5.11)
RDW: 16.3 % — ABNORMAL HIGH (ref 11.5–15.5)
WBC Count: 8.9 10*3/uL (ref 4.0–10.5)
nRBC: 0.3 % — ABNORMAL HIGH (ref 0.0–0.2)

## 2019-09-04 LAB — CMP (CANCER CENTER ONLY)
ALT: 25 U/L (ref 0–44)
AST: 23 U/L (ref 15–41)
Albumin: 3.6 g/dL (ref 3.5–5.0)
Alkaline Phosphatase: 134 U/L — ABNORMAL HIGH (ref 38–126)
Anion gap: 11 (ref 5–15)
BUN: 10 mg/dL (ref 6–20)
CO2: 27 mmol/L (ref 22–32)
Calcium: 8.9 mg/dL (ref 8.9–10.3)
Chloride: 104 mmol/L (ref 98–111)
Creatinine: 0.75 mg/dL (ref 0.44–1.00)
GFR, Est AFR Am: >60 mL/min (ref >60)
GFR, Estimated: >60 mL/min (ref >60)
Glucose, Bld: 105 mg/dL — ABNORMAL HIGH (ref 70–99)
Potassium: 3.7 mmol/L (ref 3.5–5.1)
Sodium: 142 mmol/L (ref 135–145)
Total Bilirubin: 0.4 mg/dL (ref 0.3–1.2)
Total Protein: 6.6 g/dL (ref 6.5–8.1)

## 2019-09-04 LAB — CEA (IN HOUSE-CHCC): CEA (CHCC-In House): 175.3 ng/mL — ABNORMAL HIGH (ref 0.00–5.00)

## 2019-09-04 MED ORDER — SODIUM CHLORIDE 0.9 % IV SOLN
2400.0000 mg/m2 | INTRAVENOUS | Status: DC
  Administered 2019-09-04: 5300 mg via INTRAVENOUS
  Filled 2019-09-04: qty 106

## 2019-09-04 MED ORDER — SODIUM CHLORIDE 0.9 % IV SOLN
180.0000 mg/m2 | Freq: Once | INTRAVENOUS | Status: AC
  Administered 2019-09-04: 400 mg via INTRAVENOUS
  Filled 2019-09-04: qty 15

## 2019-09-04 MED ORDER — SODIUM CHLORIDE 0.9 % IV SOLN
150.0000 mg | Freq: Once | INTRAVENOUS | Status: AC
  Administered 2019-09-04: 150 mg via INTRAVENOUS
  Filled 2019-09-04: qty 150

## 2019-09-04 MED ORDER — ATROPINE SULFATE 1 MG/ML IJ SOLN
0.5000 mg | Freq: Once | INTRAMUSCULAR | Status: AC | PRN
  Administered 2019-09-04: 0.5 mg via INTRAVENOUS

## 2019-09-04 MED ORDER — SODIUM CHLORIDE 0.9 % IV SOLN
Freq: Once | INTRAVENOUS | Status: AC
  Filled 2019-09-04: qty 250

## 2019-09-04 MED ORDER — SODIUM CHLORIDE 0.9 % IV SOLN
10.0000 mg | Freq: Once | INTRAVENOUS | Status: AC
  Administered 2019-09-04: 10 mg via INTRAVENOUS
  Filled 2019-09-04: qty 10

## 2019-09-04 MED ORDER — PALONOSETRON HCL INJECTION 0.25 MG/5ML
INTRAVENOUS | Status: AC
  Filled 2019-09-04: qty 5

## 2019-09-04 MED ORDER — SODIUM CHLORIDE 0.9% FLUSH
10.0000 mL | Freq: Once | INTRAVENOUS | Status: AC
  Administered 2019-09-04: 10 mL
  Filled 2019-09-04: qty 10

## 2019-09-04 MED ORDER — PALONOSETRON HCL INJECTION 0.25 MG/5ML
0.2500 mg | Freq: Once | INTRAVENOUS | Status: AC
  Administered 2019-09-04: 0.25 mg via INTRAVENOUS

## 2019-09-04 MED ORDER — SODIUM CHLORIDE 0.9 % IV SOLN
400.0000 mg/m2 | Freq: Once | INTRAVENOUS | Status: AC
  Administered 2019-09-04: 884 mg via INTRAVENOUS
  Filled 2019-09-04: qty 44.2

## 2019-09-04 MED ORDER — FLUOROURACIL CHEMO INJECTION 2.5 GM/50ML
400.0000 mg/m2 | Freq: Once | INTRAVENOUS | Status: AC
  Administered 2019-09-04: 900 mg via INTRAVENOUS
  Filled 2019-09-04: qty 18

## 2019-09-04 MED ORDER — ATROPINE SULFATE 1 MG/ML IJ SOLN
INTRAMUSCULAR | Status: AC
  Filled 2019-09-04: qty 1

## 2019-09-04 NOTE — Progress Notes (Signed)
[facility] OFFICE PROGRESS NOTE   Diagnosis: Small bowel carcinoma  INTERVAL HISTORY:   Hailey Houston returns as scheduled.  She completed cycle 4 FOLFIRI 08/20/2019.  She was seen in the emergency department 08/30/2019 for evaluation of abdominal pain.  Omental caking along the transverse mesocolon slightly more pronounced; a discrete duodenal mass was not seen; adenopathy at the level of the SMA stable and multiple other subcentimeter retroperitoneal lymph nodes stable.  The appendix measures up to 6 mm in diameter with mild periappendiceal fat stranding.  CT findings were discussed with surgery.  There were no convincing signs of acute appendicitis.  Antibiotics were initiated in case of early appendicitis.  Her pain became well controlled in the emergency department and she was discharged home.  She reports the pain at the right lower abdomen is much better.  No fever.  No nausea or vomiting.  No mouth sores.  No diarrhea.  Objective:  Vital signs in last 24 hours:  Blood pressure 117/76, pulse 100, temperature 97.8 F (36.6 C), temperature source Temporal, resp. rate 17, height 5' 11"  (1.803 m), weight 217 lb 1.6 oz (98.5 kg), SpO2 95 %.    HEENT: No thrush or ulcers. Resp: Lungs clear bilaterally. Cardio: Regular rate and rhythm. GI: Abdomen soft.  Mild tenderness at the right upper quadrant.  No significant tenderness at the right lower abdomen.  No hepatomegaly. Vascular: No leg edema. Neuro: Alert and oriented. Skin: Palms without erythema. Port-A-Cath without erythema.   Lab Results:  Lab Results  Component Value Date   WBC 8.9 09/04/2019   HGB 11.2 (L) 09/04/2019   HCT 34.8 (L) 09/04/2019   MCV 97.2 09/04/2019   PLT 266 09/04/2019   NEUTROABS 6.3 09/04/2019    Imaging:  No results found.  Medications: I have reviewed the patient's current medications.  Assessment/Plan: 1.Adenocarcinoma of the duodenum  CT abdomen/pelvis 06/16/2018-changes of  pancreatitis, fullness at the head of the pancreas without a defined mass, multiple subcentimeter peripancreatic lymph nodes, distal common bile duct stent, multiple nodules at the lung bases  EUS 06/14/2018-ulcerated mucosa/mass at the distal duodenal bulb with extension to the pancreas head, portal adenopathy, biopsy of duodenal mass-adenocarcinoma;foundation 1-microsatellite stable, tumor mutational burden 1, KRASG12D mutation  ERCP 06/14/2018-distal duodenal bulb mass, 1 cm proximal to the ampulla causing biliary obstruction, status post placement of a metal common bile duct stent  CT chest 06/20/2018-bilateral pulmonary nodules consistent with metastatic disease, supraclavicular, mediastinal, and hilar lymphadenopathy, changes of pancreatitis  Ultrasound-guided biopsy of a left supraclavicular lymph node 06/22/2018-metastatic adenocarcinoma  Cycle 1 FOLFOX 07/03/2018  Cycle 2 FOLFOX 07/17/2018  Cycle 3 FOLFOX 08/06/2018, Neulasta added  Cycle 4 FOLFOX 08/21/2018  CTs 09/06/2018-numerous small lung nodules generally decreased in size; new rounded masslike subpleural opacity right upper lobe with some evidence of internal cavitation,measuring 4.0 cm. Minimal fat stranding about the pancreas and duodenum which is decreased. Discrete mass is not noted. Previously seen common bile duct stent absent. Gallbladder wall thickening with faintly calcified gallstones. No biliary ductal dilatation.  Cycle 5 FOLFOX 09/10/2018(oxaliplatin dose reduceddue to elevated liver enzymes)  Cycle 6 FOLFOX 09/24/2018(oxaliplatin further dose reduced due to elevated liver enzymes)  Cycle 7 FOLFOX 10/09/2018  Cycle 8 FOLFOX 10/22/2018  CTs 11/12/2018-continued improvement in lung metastases.  Maintenance Xeloda, daily dosing 11/25/2018  CTs 02/04/2019-decrease in bilateral lung nodules, many have resolved, no evidence of disease progression, no evidence of metastatic disease to the abdomen or pelvis  Xeloda  continued  Xeloda placed  on hold 02/26/2019 due to hand-foot syndrome, resumed 10/21/2020at a dose of 1000 mg twice daily  05/13/2019-mixed response of (increased size of left upper lobe nodule, decreased size of right lower lobe nodule), new "shotty "abdominal retroperitoneal lymph nodes, new tiny nodules in the left abdominal omentum  Elevated CEA 05/13/2019  CTs12/28/2020-increased size of the left upper lobe nodule, decreased right lower lobe nodule, new small retroperitoneal nodes, new subcentimeter nodules in the left omental fat  Xeloda continued  Xeloda placed on hold 06/28/2019  CTs 07/09/2019-when enlarged pulmonary nodules, new right hilar and pretracheal lymph nodes, enlargement of retroperitoneal lymph nodes, increased omental nodularity and stranding, gallbladder wall thickening-new, increase in ascites  Cycle 1 FOLFIRI 07/15/2019  Cycle 2 FOLFIRI 08/06/2019, Neulasta  Cycle 3 FOLFIRI 08/20/2019, Neulasta  CT scan 08/30/2019-omental caking along the transverse mesocolon slightly more pronounced; discrete duodenal mass not seen; adenopathy at the level of the SMA stable and other subcentimeter retroperitoneal lymph nodes stable.  Mild periappendiceal fat stranding.  Cycle 4 FOLFIRI 09/04/2019, Neulasta 2.Biliary obstruction secondary to the duodenal mass, status post placement of a bile duct stent 06/14/2018 3.Post ERCP pancreatitis 06/16/2018 4.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 Abdominal pain-secondary to the small bowel carcinoma versus pancreatitis  9. Rash-improved 10. Neutropenia secondary to chemotherapy 07/31/2018 11.Elevated transaminases;improved 09/07/2018; improved 09/10/2018. Question related to chemotherapy. 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 appears stable.  She has completed 3 cycles of FOLFIRI.  Plan to proceed with cycle 4 today as scheduled.  We reviewed the CBC from today.  Counts adequate to proceed as above.  We discussed the CT scan she had in the emergency department to evaluate abdominal pain on 08/30/2019.  The pain she was experiencing is much better.  She will contact the office with poorly controlled pain.  She will return for lab, follow-up, cycle 5 FOLFIRI in 2 weeks.  She will contact the office in the interim as outlined above or with any other problems.  Plan reviewed with Dr. Benay Spice.    Ned Card ANP/GNP-BC   09/04/2019  12:17 PM

## 2019-09-04 NOTE — Patient Instructions (Signed)
[facility] Discharge Instructions for Patients Receiving Chemotherapy  Today you received the following chemotherapy agents: irinotecan, leucovorin, and fluorouracil.  To help prevent nausea and vomiting after your treatment, we encourage you to take your nausea medication as directed.   If you develop nausea and vomiting that is not controlled by your nausea medication, call the clinic.   BELOW ARE SYMPTOMS THAT SHOULD BE REPORTED IMMEDIATELY:  *FEVER GREATER THAN 100.5 F  *CHILLS WITH OR WITHOUT FEVER  NAUSEA AND VOMITING THAT IS NOT CONTROLLED WITH YOUR NAUSEA MEDICATION  *UNUSUAL SHORTNESS OF BREATH  *UNUSUAL BRUISING OR BLEEDING  TENDERNESS IN MOUTH AND THROAT WITH OR WITHOUT PRESENCE OF ULCERS  *URINARY PROBLEMS  *BOWEL PROBLEMS  UNUSUAL RASH Items with * indicate a potential emergency and should be followed up as soon as possible.  Feel free to call the clinic should you have any questions or concerns. The clinic phone number is [phone].  Please show the CHEMO ALERT CARD at check-in to the Emergency Department and triage nurse.   

## 2019-09-04 NOTE — Telephone Encounter (Signed)
Left VM asking [employer] claims specialist if the answers to the following questions can be stated in a dictated by physician: Date patient advised to stop working Barriers preventing work Estimated return to work date

## 2019-09-05 ENCOUNTER — Telehealth: Payer: Self-pay | Admitting: Nurse Practitioner

## 2019-09-05 NOTE — Telephone Encounter (Signed)
Scheduled per los. Called and spoke with patient. Confirmed appt 

## 2019-09-06 ENCOUNTER — Other Ambulatory Visit: Payer: Self-pay

## 2019-09-06 ENCOUNTER — Inpatient Hospital Stay: Payer: 59

## 2019-09-06 ENCOUNTER — Telehealth: Payer: Self-pay

## 2019-09-06 ENCOUNTER — Other Ambulatory Visit: Payer: Self-pay | Admitting: Genetic Counselor

## 2019-09-06 VITALS — BP 118/72 | HR 98 | Temp 98.2°F | Resp 18

## 2019-09-06 DIAGNOSIS — C170 Malignant neoplasm of duodenum: Secondary | ICD-10-CM

## 2019-09-06 DIAGNOSIS — Z5111 Encounter for antineoplastic chemotherapy: Secondary | ICD-10-CM | POA: Diagnosis not present

## 2019-09-06 MED ORDER — HEPARIN SOD (PORK) LOCK FLUSH 100 UNIT/ML IV SOLN
500.0000 [IU] | Freq: Once | INTRAVENOUS | Status: AC | PRN
  Administered 2019-09-06: 14:00:00 500 [IU]
  Filled 2019-09-06: qty 5

## 2019-09-06 MED ORDER — SODIUM CHLORIDE 0.9% FLUSH
10.0000 mL | INTRAVENOUS | Status: DC | PRN
  Administered 2019-09-06: 10 mL
  Filled 2019-09-06: qty 10

## 2019-09-06 MED ORDER — PEGFILGRASTIM INJECTION 6 MG/0.6ML ~~LOC~~
6.0000 mg | PREFILLED_SYRINGE | Freq: Once | SUBCUTANEOUS | Status: AC
  Administered 2019-09-06: 14:00:00 6 mg via SUBCUTANEOUS

## 2019-09-06 MED ORDER — PEGFILGRASTIM INJECTION 6 MG/0.6ML ~~LOC~~
PREFILLED_SYRINGE | SUBCUTANEOUS | Status: AC
  Filled 2019-09-06: qty 0.6

## 2019-09-06 NOTE — Patient Instructions (Signed)

## 2019-09-06 NOTE — Telephone Encounter (Signed)
Call back placed as requested by patient. VM left, no answer

## 2019-09-09 ENCOUNTER — Inpatient Hospital Stay: Payer: 59

## 2019-09-09 ENCOUNTER — Inpatient Hospital Stay: Payer: 59 | Admitting: Genetic Counselor

## 2019-09-11 NOTE — Progress Notes (Signed)
Pharmacist Chemotherapy Monitoring - Follow Up Assessment    I verify that I have reviewed each item in the below checklist:  . Regimen for the patient is scheduled for the appropriate day and plan matches scheduled date. Marland Kitchen Appropriate non-routine labs are ordered dependent on drug ordered. . If applicable, additional medications reviewed and ordered per protocol based on lifetime cumulative doses and/or treatment regimen.   Plan for follow-up and/or issues identified: No . I-vent associated with next due treatment: No . MD and/or nursing notified: No  Juliann Olesky D 09/11/2019 2:37 PM

## 2019-09-15 ENCOUNTER — Other Ambulatory Visit: Payer: Self-pay | Admitting: Oncology

## 2019-09-15 ENCOUNTER — Other Ambulatory Visit: Payer: Self-pay | Admitting: Nurse Practitioner

## 2019-09-15 DIAGNOSIS — C170 Malignant neoplasm of duodenum: Secondary | ICD-10-CM

## 2019-09-16 ENCOUNTER — Other Ambulatory Visit: Payer: Self-pay | Admitting: Nurse Practitioner

## 2019-09-16 ENCOUNTER — Encounter: Payer: Self-pay | Admitting: Nurse Practitioner

## 2019-09-16 ENCOUNTER — Other Ambulatory Visit: Payer: Self-pay | Admitting: *Deleted

## 2019-09-17 ENCOUNTER — Inpatient Hospital Stay: Payer: 59

## 2019-09-17 ENCOUNTER — Other Ambulatory Visit: Payer: Self-pay

## 2019-09-17 ENCOUNTER — Inpatient Hospital Stay: Payer: 59 | Attending: Oncology | Admitting: Nurse Practitioner

## 2019-09-17 ENCOUNTER — Encounter: Payer: Self-pay | Admitting: Nurse Practitioner

## 2019-09-17 VITALS — BP 127/86 | HR 96 | Temp 98.0°F | Resp 18 | Ht 71.0 in | Wt 216.8 lb

## 2019-09-17 DIAGNOSIS — C170 Malignant neoplasm of duodenum: Secondary | ICD-10-CM

## 2019-09-17 DIAGNOSIS — D649 Anemia, unspecified: Secondary | ICD-10-CM | POA: Insufficient documentation

## 2019-09-17 DIAGNOSIS — Z5111 Encounter for antineoplastic chemotherapy: Secondary | ICD-10-CM | POA: Diagnosis not present

## 2019-09-17 DIAGNOSIS — D701 Agranulocytosis secondary to cancer chemotherapy: Secondary | ICD-10-CM | POA: Insufficient documentation

## 2019-09-17 DIAGNOSIS — G620 Drug-induced polyneuropathy: Secondary | ICD-10-CM | POA: Diagnosis not present

## 2019-09-17 DIAGNOSIS — Z5189 Encounter for other specified aftercare: Secondary | ICD-10-CM | POA: Diagnosis not present

## 2019-09-17 DIAGNOSIS — Z95828 Presence of other vascular implants and grafts: Secondary | ICD-10-CM

## 2019-09-17 DIAGNOSIS — Z452 Encounter for adjustment and management of vascular access device: Secondary | ICD-10-CM | POA: Insufficient documentation

## 2019-09-17 DIAGNOSIS — C7800 Secondary malignant neoplasm of unspecified lung: Secondary | ICD-10-CM | POA: Insufficient documentation

## 2019-09-17 LAB — CMP (CANCER CENTER ONLY)
ALT: 33 U/L (ref 0–44)
AST: 28 U/L (ref 15–41)
Albumin: 3.8 g/dL (ref 3.5–5.0)
Alkaline Phosphatase: 133 U/L — ABNORMAL HIGH (ref 38–126)
Anion gap: 9 (ref 5–15)
BUN: 8 mg/dL (ref 6–20)
CO2: 29 mmol/L (ref 22–32)
Calcium: 9.1 mg/dL (ref 8.9–10.3)
Chloride: 103 mmol/L (ref 98–111)
Creatinine: 0.77 mg/dL (ref 0.44–1.00)
GFR, Est AFR Am: >60 mL/min (ref >60)
GFR, Estimated: >60 mL/min (ref >60)
Glucose, Bld: 95 mg/dL (ref 70–99)
Potassium: 4 mmol/L (ref 3.5–5.1)
Sodium: 141 mmol/L (ref 135–145)
Total Bilirubin: 0.4 mg/dL (ref 0.3–1.2)
Total Protein: 6.7 g/dL (ref 6.5–8.1)

## 2019-09-17 LAB — CBC WITH DIFFERENTIAL (CANCER CENTER ONLY)
Abs Immature Granulocytes: 0.01 10*3/uL (ref 0.00–0.07)
Basophils Absolute: 0 10*3/uL (ref 0.0–0.1)
Basophils Relative: 1 %
Eosinophils Absolute: 0.1 10*3/uL (ref 0.0–0.5)
Eosinophils Relative: 2 %
HCT: 33.3 % — ABNORMAL LOW (ref 36.0–46.0)
Hemoglobin: 10.5 g/dL — ABNORMAL LOW (ref 12.0–15.0)
Immature Granulocytes: 0 %
Lymphocytes Relative: 42 %
Lymphs Abs: 1.3 10*3/uL (ref 0.7–4.0)
MCH: 30.1 pg (ref 26.0–34.0)
MCHC: 31.5 g/dL (ref 30.0–36.0)
MCV: 95.4 fL (ref 80.0–100.0)
Monocytes Absolute: 0.5 10*3/uL (ref 0.1–1.0)
Monocytes Relative: 16 %
Neutro Abs: 1.2 10*3/uL — ABNORMAL LOW (ref 1.7–7.7)
Neutrophils Relative %: 39 %
Platelet Count: 290 10*3/uL (ref 150–400)
RBC: 3.49 MIL/uL — ABNORMAL LOW (ref 3.87–5.11)
RDW: 16.7 % — ABNORMAL HIGH (ref 11.5–15.5)
WBC Count: 3 10*3/uL — ABNORMAL LOW (ref 4.0–10.5)
nRBC: 0 % (ref 0.0–0.2)

## 2019-09-17 LAB — CEA (IN HOUSE-CHCC): CEA (CHCC-In House): 146.8 ng/mL — ABNORMAL HIGH (ref 0.00–5.00)

## 2019-09-17 MED ORDER — SODIUM CHLORIDE 0.9 % IV SOLN
140.0000 mg/m2 | Freq: Once | INTRAVENOUS | Status: AC
  Administered 2019-09-17: 300 mg via INTRAVENOUS
  Filled 2019-09-17: qty 15

## 2019-09-17 MED ORDER — FLUOROURACIL CHEMO INJECTION 2.5 GM/50ML
300.0000 mg/m2 | Freq: Once | INTRAVENOUS | Status: AC
  Administered 2019-09-17: 650 mg via INTRAVENOUS
  Filled 2019-09-17: qty 13

## 2019-09-17 MED ORDER — SODIUM CHLORIDE 0.9 % IV SOLN
2000.0000 mg/m2 | INTRAVENOUS | Status: DC
  Administered 2019-09-17: 4400 mg via INTRAVENOUS
  Filled 2019-09-17: qty 88

## 2019-09-17 MED ORDER — PALONOSETRON HCL INJECTION 0.25 MG/5ML
0.2500 mg | Freq: Once | INTRAVENOUS | Status: AC
  Administered 2019-09-17: 0.25 mg via INTRAVENOUS

## 2019-09-17 MED ORDER — SODIUM CHLORIDE 0.9% FLUSH
10.0000 mL | Freq: Once | INTRAVENOUS | Status: AC
  Administered 2019-09-17: 10 mL
  Filled 2019-09-17: qty 10

## 2019-09-17 MED ORDER — ATROPINE SULFATE 1 MG/ML IJ SOLN
INTRAMUSCULAR | Status: AC
  Filled 2019-09-17: qty 1

## 2019-09-17 MED ORDER — SODIUM CHLORIDE 0.9 % IV SOLN
10.0000 mg | Freq: Once | INTRAVENOUS | Status: AC
  Administered 2019-09-17: 13:00:00 10 mg via INTRAVENOUS
  Filled 2019-09-17: qty 10

## 2019-09-17 MED ORDER — SODIUM CHLORIDE 0.9 % IV SOLN
150.0000 mg | Freq: Once | INTRAVENOUS | Status: AC
  Administered 2019-09-17: 13:00:00 150 mg via INTRAVENOUS
  Filled 2019-09-17: qty 150

## 2019-09-17 MED ORDER — ATROPINE SULFATE 1 MG/ML IJ SOLN
0.5000 mg | Freq: Once | INTRAMUSCULAR | Status: AC | PRN
  Administered 2019-09-17: 0.5 mg via INTRAVENOUS

## 2019-09-17 MED ORDER — HYDROMORPHONE HCL 4 MG PO TABS: 4.0000 mg | ORAL_TABLET | Freq: Three times a day (TID) | ORAL | 0 refills | Status: DC | PRN

## 2019-09-17 MED ORDER — SODIUM CHLORIDE 0.9 % IV SOLN
300.0000 mg/m2 | Freq: Once | INTRAVENOUS | Status: AC
  Administered 2019-09-17: 14:00:00 664 mg via INTRAVENOUS
  Filled 2019-09-17: qty 33.2

## 2019-09-17 MED ORDER — SODIUM CHLORIDE 0.9 % IV SOLN
Freq: Once | INTRAVENOUS | Status: AC
  Filled 2019-09-17: qty 250

## 2019-09-17 MED ORDER — PALONOSETRON HCL INJECTION 0.25 MG/5ML
INTRAVENOUS | Status: AC
  Filled 2019-09-17: qty 5

## 2019-09-17 NOTE — Patient Instructions (Signed)
[facility] Discharge Instructions for Patients Receiving Chemotherapy  Today you received the following chemotherapy agents: irinotecan, leucovorin, and 5FU.  To help prevent nausea and vomiting after your treatment, we encourage you to take your nausea medication as directed.   If you develop nausea and vomiting that is not controlled by your nausea medication, call the clinic.   BELOW ARE SYMPTOMS THAT SHOULD BE REPORTED IMMEDIATELY:  *FEVER GREATER THAN 100.5 F  *CHILLS WITH OR WITHOUT FEVER  NAUSEA AND VOMITING THAT IS NOT CONTROLLED WITH YOUR NAUSEA MEDICATION  *UNUSUAL SHORTNESS OF BREATH  *UNUSUAL BRUISING OR BLEEDING  TENDERNESS IN MOUTH AND THROAT WITH OR WITHOUT PRESENCE OF ULCERS  *URINARY PROBLEMS  *BOWEL PROBLEMS  UNUSUAL RASH Items with * indicate a potential emergency and should be followed up as soon as possible.  Feel free to call the clinic should you have any questions or concerns. The clinic phone number is [phone].  Please show the CHEMO ALERT CARD at check-in to the Emergency Department and triage nurse.   

## 2019-09-17 NOTE — Progress Notes (Addendum)
[facility] OFFICE PROGRESS NOTE   Diagnosis: Small bowel carcinoma  INTERVAL HISTORY:   Hailey Houston returns as scheduled.  She completed cycle 5 FOLFIRI 09/04/2019.  She denies nausea/vomiting.  No mouth sores.  No diarrhea.  Overall abdominal pain is well controlled with MS Contin.  She occasionally takes breakthrough Dilaudid.  Objective:  Vital signs in last 24 hours:  Blood pressure 127/86, pulse 96, temperature 98 F (36.7 C), resp. rate 18, height 5' 11"  (1.803 m), weight 216 lb 12.8 oz (98.3 kg), SpO2 96 %.    HEENT: No thrush or ulcers. GI: Abdomen soft and nontender.  No hepatomegaly.  No mass. Vascular: No leg edema. Neuro: Alert and oriented. Skin: Palms without erythema. Port-A-Cath without erythema.  Lab Results:  Lab Results  Component Value Date   WBC 3.0 (L) 09/17/2019   HGB 10.5 (L) 09/17/2019   HCT 33.3 (L) 09/17/2019   MCV 95.4 09/17/2019   PLT 290 09/17/2019   NEUTROABS 1.2 (L) 09/17/2019    Imaging:  No results found.  Medications: I have reviewed the patient's current medications.  Assessment/Plan: 1.Adenocarcinoma of the duodenum  CT abdomen/pelvis 06/16/2018-changes of pancreatitis, fullness at the head of the pancreas without a defined mass, multiple subcentimeter peripancreatic lymph nodes, distal common bile duct stent, multiple nodules at the lung bases  EUS 06/14/2018-ulcerated mucosa/mass at the distal duodenal bulb with extension to the pancreas head, portal adenopathy, biopsy of duodenal mass-adenocarcinoma;foundation 1-microsatellite stable, tumor mutational burden 1, KRASG12D mutation  ERCP 06/14/2018-distal duodenal bulb mass, 1 cm proximal to the ampulla causing biliary obstruction, status post placement of a metal common bile duct stent  CT chest 06/20/2018-bilateral pulmonary nodules consistent with metastatic disease, supraclavicular, mediastinal, and hilar lymphadenopathy, changes of  pancreatitis  Ultrasound-guided biopsy of a left supraclavicular lymph node 06/22/2018-metastatic adenocarcinoma  Cycle 1 FOLFOX 07/03/2018  Cycle 2 FOLFOX 07/17/2018  Cycle 3 FOLFOX 08/06/2018, Neulasta added  Cycle 4 FOLFOX 08/21/2018  CTs 09/06/2018-numerous small lung nodules generally decreased in size; new rounded masslike subpleural opacity right upper lobe with some evidence of internal cavitation,measuring 4.0 cm. Minimal fat stranding about the pancreas and duodenum which is decreased. Discrete mass is not noted. Previously seen common bile duct stent absent. Gallbladder wall thickening with faintly calcified gallstones. No biliary ductal dilatation.  Cycle 5 FOLFOX 09/10/2018(oxaliplatin dose reduceddue to elevated liver enzymes)  Cycle 6 FOLFOX 09/24/2018(oxaliplatin further dose reduced due to elevated liver enzymes)  Cycle 7 FOLFOX 10/09/2018  Cycle 8 FOLFOX 10/22/2018  CTs 11/12/2018-continued improvement in lung metastases.  Maintenance Xeloda, daily dosing 11/25/2018  CTs 02/04/2019-decrease in bilateral lung nodules, many have resolved, no evidence of disease progression, no evidence of metastatic disease to the abdomen or pelvis  Xeloda continued  Xeloda placed on hold 02/26/2019 due to hand-foot syndrome, resumed 10/21/2020at a dose of 1000 mg twice daily  05/13/2019-mixed response of (increased size of left upper lobe nodule, decreased size of right lower lobe nodule), new "shotty "abdominal retroperitoneal lymph nodes, new tiny nodules in the left abdominal omentum  Elevated CEA 05/13/2019  CTs12/28/2020-increased size of the left upper lobe nodule, decreased right lower lobe nodule, new small retroperitoneal nodes, new subcentimeter nodules in the left omental fat  Xeloda continued  Xeloda placed on hold 06/28/2019  CTs 07/09/2019-when enlarged pulmonary nodules, new right hilar and pretracheal lymph nodes, enlargement of retroperitoneal lymph nodes,  increased omental nodularity and stranding, gallbladder wall thickening-new, increase in ascites  Cycle 1 FOLFIRI 07/15/2019  Cycle 2 FOLFIRI 08/06/2019,  Neulasta  Cycle 3 FOLFIRI 08/20/2019, Neulasta  CT scan 08/30/2019-omental caking along the transverse mesocolon slightly more pronounced; discrete duodenal mass not seen; adenopathy at the level of the SMA stable and other subcentimeter retroperitoneal lymph nodes stable.  Mild periappendiceal fat stranding.  Cycle 4 FOLFIRI 09/04/2019, Neulasta  Cycle 5 FOLFIRI 09/17/2019, Irinotecan dose reduced due to neutropenia; Neulasta 2.Biliary obstruction secondary to the duodenal mass, status post placement of a bile duct stent 06/14/2018 3.Post ERCP pancreatitis 06/16/2018 4.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 Abdominal pain-secondary to the small bowel carcinoma versus pancreatitis  9. Rash-improved 10. Neutropenia secondary to chemotherapy 07/31/2018 11.Elevated transaminases;improved 09/07/2018; improved 09/10/2018. Question related to chemotherapy. 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 Bunney appears stable.  She has completed 4 cycles of FOLFIRI.  We reviewed the CBC from today.  She has moderate neutropenia.  We discussed options to include delaying treatment for 1 week versus proceeding with treatment today as scheduled with a dose reduction of irinotecan, Neulasta on day of pump discontinuation.  She would like to proceed with treatment today as scheduled (cycle 5 FOLFIRI).  She understands the risk of infection.  She will contact the office with fever, chills, other signs of infection.  Plan for restaging CTs after she has completed 6 cycles of FOLFIRI.  A new Dilaudid prescription was sent to  her pharmacy.  She will return for lab, follow-up, cycle 6 FOLFIRI in 2 weeks.  She will contact the office in the interim as outlined above or with any other problems.  Patient seen with Dr. Benay Spice.    Ned Card ANP/GNP-BC   09/17/2019  11:35 AM  This was a shared visit with Ned Card.  Ms. Wortley appears well today.  Her pain is under better control with the current narcotic regimen.  She has mild neutropenia.  We discussed the risk of proceeding with chemotherapy.  She understands potential for infection.  She would like to proceed with chemotherapy today as opposed to a treatment delay.  The irinotecan and 5-FU will be dose reduced. she will receive G-CSF support.  Julieanne Manson, MD

## 2019-09-19 ENCOUNTER — Other Ambulatory Visit: Payer: Self-pay

## 2019-09-19 ENCOUNTER — Inpatient Hospital Stay: Payer: 59

## 2019-09-19 VITALS — BP 115/72 | HR 96 | Temp 98.5°F | Resp 18

## 2019-09-19 DIAGNOSIS — Z5111 Encounter for antineoplastic chemotherapy: Secondary | ICD-10-CM | POA: Diagnosis not present

## 2019-09-19 DIAGNOSIS — C170 Malignant neoplasm of duodenum: Secondary | ICD-10-CM

## 2019-09-19 DIAGNOSIS — Z95828 Presence of other vascular implants and grafts: Secondary | ICD-10-CM

## 2019-09-19 MED ORDER — HEPARIN SOD (PORK) LOCK FLUSH 100 UNIT/ML IV SOLN
500.0000 [IU] | Freq: Once | INTRAVENOUS | Status: AC
  Administered 2019-09-19: 500 [IU]
  Filled 2019-09-19: qty 5

## 2019-09-19 MED ORDER — PEGFILGRASTIM INJECTION 6 MG/0.6ML ~~LOC~~
6.0000 mg | PREFILLED_SYRINGE | Freq: Once | SUBCUTANEOUS | Status: AC
  Administered 2019-09-19: 6 mg via SUBCUTANEOUS

## 2019-09-19 MED ORDER — PEGFILGRASTIM INJECTION 6 MG/0.6ML ~~LOC~~
PREFILLED_SYRINGE | SUBCUTANEOUS | Status: AC
  Filled 2019-09-19: qty 0.6

## 2019-09-19 MED ORDER — SODIUM CHLORIDE 0.9% FLUSH
10.0000 mL | Freq: Once | INTRAVENOUS | Status: AC
  Administered 2019-09-19: 10 mL
  Filled 2019-09-19: qty 10

## 2019-09-19 NOTE — Patient Instructions (Signed)
Pegfilgrastim injection What is this medicine? PEGFILGRASTIM (PEG fil gra stim) is a long-acting granulocyte colony-stimulating factor that stimulates the growth of neutrophils, a type of white blood cell important in the body's fight against infection. It is used to reduce the incidence of fever and infection in patients with certain types of cancer who are receiving chemotherapy that affects the bone marrow, and to increase survival after being exposed to high doses of radiation. This medicine may be used for other purposes; ask your health care provider or pharmacist if you have questions. COMMON BRAND NAME(S): Fulphila, Neulasta, UDENYCA, Ziextenzo What should I tell my health care provider before I take this medicine? They need to know if you have any of these conditions:  kidney disease  latex allergy  ongoing radiation therapy  sickle cell disease  skin reactions to acrylic adhesives (On-Body Injector only)  an unusual or allergic reaction to pegfilgrastim, filgrastim, other medicines, foods, dyes, or preservatives  pregnant or trying to get pregnant  breast-feeding How should I use this medicine? This medicine is for injection under the skin. If you get this medicine at home, you will be taught how to prepare and give the pre-filled syringe or how to use the On-body Injector. Refer to the patient Instructions for Use for detailed instructions. Use exactly as directed. Tell your healthcare provider immediately if you suspect that the On-body Injector may not have performed as intended or if you suspect the use of the On-body Injector resulted in a missed or partial dose. It is important that you put your used needles and syringes in a special sharps container. Do not put them in a trash can. If you do not have a sharps container, call your pharmacist or healthcare provider to get one. Talk to your pediatrician regarding the use of this medicine in children. While this drug may be  prescribed for selected conditions, precautions do apply. Overdosage: If you think you have taken too much of this medicine contact a poison control center or emergency room at once. NOTE: This medicine is only for you. Do not share this medicine with others. What if I miss a dose? It is important not to miss your dose. Call your doctor or health care professional if you miss your dose. If you miss a dose due to an On-body Injector failure or leakage, a new dose should be administered as soon as possible using a single prefilled syringe for manual use. What may interact with this medicine? Interactions have not been studied. Give your health care provider a list of all the medicines, herbs, non-prescription drugs, or dietary supplements you use. Also tell them if you smoke, drink alcohol, or use illegal drugs. Some items may interact with your medicine. This list may not describe all possible interactions. Give your health care provider a list of all the medicines, herbs, non-prescription drugs, or dietary supplements you use. Also tell them if you smoke, drink alcohol, or use illegal drugs. Some items may interact with your medicine. What should I watch for while using this medicine? You may need blood work done while you are taking this medicine. If you are going to need a MRI, CT scan, or other procedure, tell your doctor that you are using this medicine (On-Body Injector only). What side effects may I notice from receiving this medicine? Side effects that you should report to your doctor or health care professional as soon as possible:  allergic reactions like skin rash, itching or hives, swelling of the   face, lips, or tongue  back pain  dizziness  fever  pain, redness, or irritation at site where injected  pinpoint red spots on the skin  red or dark-brown urine  shortness of breath or breathing problems  stomach or side pain, or pain at the  shoulder  swelling  tiredness  trouble passing urine or change in the amount of urine Side effects that usually do not require medical attention (report to your doctor or health care professional if they continue or are bothersome):  bone pain  muscle pain This list may not describe all possible side effects. Call your doctor for medical advice about side effects. You may report side effects to FDA at 1-800-FDA-1088. Where should I keep my medicine? Keep out of the reach of children. If you are using this medicine at home, you will be instructed on how to store it. Throw away any unused medicine after the expiration date on the label. NOTE: This sheet is a summary. It may not cover all possible information. If you have questions about this medicine, talk to your doctor, pharmacist, or health care provider.  2020 Elsevier/Gold Standard (2017-08-07 16:57:08) Implanted Port Home Guide An implanted port is a device that is placed under the skin. It is usually placed in the chest. The device can be used to give IV medicine, to take blood, or for dialysis. You may have an implanted port if:  You need IV medicine that would be irritating to the small veins in your hands or arms.  You need IV medicines, such as antibiotics, for a long period of time.  You need IV nutrition for a long period of time.  You need dialysis. Having a port means that your health care provider will not need to use the veins in your arms for these procedures. You may have fewer limitations when using a port than you would if you used other types of long-term IVs, and you will likely be able to return to normal activities after your incision heals. An implanted port has two main parts:  Reservoir. The reservoir is the part where a needle is inserted to give medicines or draw blood. The reservoir is round. After it is placed, it appears as a small, raised area under your skin.  Catheter. The catheter is a thin,  flexible tube that connects the reservoir to a vein. Medicine that is inserted into the reservoir goes into the catheter and then into the vein. How is my port accessed? To access your port:  A numbing cream may be placed on the skin over the port site.  Your health care provider will put on a mask and sterile gloves.  The skin over your port will be cleaned carefully with a germ-killing soap and allowed to dry.  Your health care provider will gently pinch the port and insert a needle into it.  Your health care provider will check for a blood return to make sure the port is in the vein and is not clogged.  If your port needs to remain accessed to get medicine continuously (constant infusion), your health care provider will place a clear bandage (dressing) over the needle site. The dressing and needle will need to be changed every week, or as told by your health care provider. What is flushing? Flushing helps keep the port from getting clogged. Follow instructions from your health care provider about how and when to flush the port. Ports are usually flushed with saline solution or a medicine   called heparin. The need for flushing will depend on how the port is used:  If the port is only used from time to time to give medicines or draw blood, the port may need to be flushed: ? Before and after medicines have been given. ? Before and after blood has been drawn. ? As part of routine maintenance. Flushing may be recommended every 4-6 weeks.  If a constant infusion is running, the port may not need to be flushed.  Throw away any syringes in a disposal container that is meant for sharp items (sharps container). You can buy a sharps container from a pharmacy, or you can make one by using an empty hard plastic bottle with a cover. How long will my port stay implanted? The port can stay in for as long as your health care provider thinks it is needed. When it is time for the port to come out, a  surgery will be done to remove it. The surgery will be similar to the procedure that was done to put the port in. Follow these instructions at home:   Flush your port as told by your health care provider.  If you need an infusion over several days, follow instructions from your health care provider about how to take care of your port site. Make sure you: ? Wash your hands with soap and water before you change your dressing. If soap and water are not available, use alcohol-based hand sanitizer. ? Change your dressing as told by your health care provider. ? Place any used dressings or infusion bags into a plastic bag. Throw that bag in the trash. ? Keep the dressing that covers the needle clean and dry. Do not get it wet. ? Do not use scissors or sharp objects near the tube. ? Keep the tube clamped, unless it is being used.  Check your port site every day for signs of infection. Check for: ? Redness, swelling, or pain. ? Fluid or blood. ? Pus or a bad smell.  Protect the skin around the port site. ? Avoid wearing bra straps that rub or irritate the site. ? Protect the skin around your port from seat belts. Place a soft pad over your chest if needed.  Bathe or shower as told by your health care provider. The site may get wet as long as you are not actively receiving an infusion.  Return to your normal activities as told by your health care provider. Ask your health care provider what activities are safe for you.  Carry a medical alert card or wear a medical alert bracelet at all times. This will let health care providers know that you have an implanted port in case of an emergency. Get help right away if:  You have redness, swelling, or pain at the port site.  You have fluid or blood coming from your port site.  You have pus or a bad smell coming from the port site.  You have a fever. Summary  Implanted ports are usually placed in the chest for long-term IV access.  Follow  instructions from your health care provider about flushing the port and changing bandages (dressings).  Take care of the area around your port by avoiding clothing that puts pressure on the area, and by watching for signs of infection.  Protect the skin around your port from seat belts. Place a soft pad over your chest if needed.  Get help right away if you have a fever or you have redness,   swelling, pain, drainage, or a bad smell at the port site. This information is not intended to replace advice given to you by your health care provider. Make sure you discuss any questions you have with your health care provider. Document Revised: 08/24/2018 Document Reviewed: 06/04/2016 Elsevier Patient Education  2020 Elsevier Inc.  

## 2019-09-20 ENCOUNTER — Other Ambulatory Visit: Payer: Self-pay | Admitting: Nurse Practitioner

## 2019-09-20 ENCOUNTER — Telehealth: Payer: Self-pay | Admitting: *Deleted

## 2019-09-20 DIAGNOSIS — C170 Malignant neoplasm of duodenum: Secondary | ICD-10-CM

## 2019-09-20 MED ORDER — MORPHINE SULFATE ER 30 MG PO TBCR: 30.0000 mg | EXTENDED_RELEASE_TABLET | Freq: Two times a day (BID) | ORAL | 0 refills | Status: DC

## 2019-09-20 MED FILL — MORPHINE SULF ER 30 MG TAB: 30 | 30 days supply | Qty: 60 | Fill #0

## 2019-09-20 NOTE — Telephone Encounter (Signed)
Patient had called [facility] regarding finding a pharmacy that will deliver her narcotics since she can no longer drive taking the MS Contin. Determined that [facility] deliver narcotics via [facility] with patient sign for delivery. She needs refill on MS Contin soon--has #10 left. Ned Card, NP will refill and send to [facility].

## 2019-09-23 ENCOUNTER — Telehealth: Payer: Self-pay | Admitting: *Deleted

## 2019-09-23 NOTE — Telephone Encounter (Signed)
Left VM requesting to pick up her MS Contin at [facility] instead of shipping it to her.  Called back and left VM that the script was sent to [facility] on 09/20/19 and most likely has already been shipped. Provided telephone # to call pharmacy to f/u on status.

## 2019-09-24 ENCOUNTER — Encounter: Payer: Self-pay | Admitting: *Deleted

## 2019-09-24 ENCOUNTER — Other Ambulatory Visit: Payer: Self-pay | Admitting: Oncology

## 2019-09-24 DIAGNOSIS — C170 Malignant neoplasm of duodenum: Secondary | ICD-10-CM

## 2019-09-24 NOTE — Progress Notes (Signed)
Received refill requests from pharmacy for KCL, Lexapro and Protonix. All were at least 2 months too early to fill. Spoke with pharmacist and she reports patient just wanted some on file for the future. Informed pharmacy, we will not refill these so early. Confirmed that the KCL 20 meq was refilled for #90 on 08/01/19.

## 2019-09-29 ENCOUNTER — Other Ambulatory Visit: Payer: Self-pay | Admitting: Oncology

## 2019-10-01 ENCOUNTER — Inpatient Hospital Stay: Payer: 59

## 2019-10-01 ENCOUNTER — Other Ambulatory Visit: Payer: Self-pay

## 2019-10-01 ENCOUNTER — Inpatient Hospital Stay (HOSPITAL_BASED_OUTPATIENT_CLINIC_OR_DEPARTMENT_OTHER): Payer: 59 | Admitting: Oncology

## 2019-10-01 VITALS — HR 98

## 2019-10-01 VITALS — BP 124/86 | HR 105 | Temp 98.0°F | Resp 17 | Ht 71.0 in | Wt 217.2 lb

## 2019-10-01 DIAGNOSIS — C170 Malignant neoplasm of duodenum: Secondary | ICD-10-CM

## 2019-10-01 DIAGNOSIS — Z95828 Presence of other vascular implants and grafts: Secondary | ICD-10-CM

## 2019-10-01 DIAGNOSIS — Z5111 Encounter for antineoplastic chemotherapy: Secondary | ICD-10-CM | POA: Diagnosis not present

## 2019-10-01 LAB — CBC WITH DIFFERENTIAL (CANCER CENTER ONLY)
Abs Immature Granulocytes: 0.38 10*3/uL — ABNORMAL HIGH (ref 0.00–0.07)
Basophils Absolute: 0.1 10*3/uL (ref 0.0–0.1)
Basophils Relative: 1 %
Eosinophils Absolute: 0.1 10*3/uL (ref 0.0–0.5)
Eosinophils Relative: 1 %
HCT: 35.6 % — ABNORMAL LOW (ref 36.0–46.0)
Hemoglobin: 11.3 g/dL — ABNORMAL LOW (ref 12.0–15.0)
Immature Granulocytes: 5 %
Lymphocytes Relative: 23 %
Lymphs Abs: 1.9 10*3/uL (ref 0.7–4.0)
MCH: 29.6 pg (ref 26.0–34.0)
MCHC: 31.7 g/dL (ref 30.0–36.0)
MCV: 93.2 fL (ref 80.0–100.0)
Monocytes Absolute: 0.8 10*3/uL (ref 0.1–1.0)
Monocytes Relative: 10 %
Neutro Abs: 4.9 10*3/uL (ref 1.7–7.7)
Neutrophils Relative %: 60 %
Platelet Count: 279 10*3/uL (ref 150–400)
RBC: 3.82 MIL/uL — ABNORMAL LOW (ref 3.87–5.11)
RDW: 17.4 % — ABNORMAL HIGH (ref 11.5–15.5)
WBC Count: 8.2 10*3/uL (ref 4.0–10.5)
nRBC: 0 % (ref 0.0–0.2)

## 2019-10-01 LAB — CMP (CANCER CENTER ONLY)
ALT: 53 U/L — ABNORMAL HIGH (ref 0–44)
AST: 48 U/L — ABNORMAL HIGH (ref 15–41)
Albumin: 3.9 g/dL (ref 3.5–5.0)
Alkaline Phosphatase: 147 U/L — ABNORMAL HIGH (ref 38–126)
Anion gap: 14 (ref 5–15)
BUN: 12 mg/dL (ref 6–20)
CO2: 25 mmol/L (ref 22–32)
Calcium: 9.4 mg/dL (ref 8.9–10.3)
Chloride: 101 mmol/L (ref 98–111)
Creatinine: 0.8 mg/dL (ref 0.44–1.00)
GFR, Est AFR Am: >60 mL/min (ref >60)
GFR, Estimated: >60 mL/min (ref >60)
Glucose, Bld: 111 mg/dL — ABNORMAL HIGH (ref 70–99)
Potassium: 3.5 mmol/L (ref 3.5–5.1)
Sodium: 140 mmol/L (ref 135–145)
Total Bilirubin: 0.3 mg/dL (ref 0.3–1.2)
Total Protein: 6.8 g/dL (ref 6.5–8.1)

## 2019-10-01 LAB — CEA (IN HOUSE-CHCC): CEA (CHCC-In House): 140.78 ng/mL — ABNORMAL HIGH (ref 0.00–5.00)

## 2019-10-01 MED ORDER — SODIUM CHLORIDE 0.9 % IV SOLN
140.0000 mg/m2 | Freq: Once | INTRAVENOUS | Status: AC
  Administered 2019-10-01: 300 mg via INTRAVENOUS
  Filled 2019-10-01: qty 15

## 2019-10-01 MED ORDER — SODIUM CHLORIDE 0.9 % IV SOLN
300.0000 mg/m2 | Freq: Once | INTRAVENOUS | Status: AC
  Administered 2019-10-01: 664 mg via INTRAVENOUS
  Filled 2019-10-01: qty 33.2

## 2019-10-01 MED ORDER — ATROPINE SULFATE 1 MG/ML IJ SOLN: 0.5000 mg | Freq: Once | INTRAMUSCULAR | Status: DC | PRN

## 2019-10-01 MED ORDER — ATROPINE SULFATE 0.4 MG/ML IJ SOLN
0.4000 mg | Freq: Once | INTRAMUSCULAR | Status: AC | PRN
  Administered 2019-10-01: 0.4 mg via INTRAVENOUS

## 2019-10-01 MED ORDER — PALONOSETRON HCL INJECTION 0.25 MG/5ML
INTRAVENOUS | Status: AC
  Filled 2019-10-01: qty 5

## 2019-10-01 MED ORDER — SODIUM CHLORIDE 0.9 % IV SOLN
Freq: Once | INTRAVENOUS | Status: AC
  Filled 2019-10-01: qty 250

## 2019-10-01 MED ORDER — FLUOROURACIL CHEMO INJECTION 2.5 GM/50ML
300.0000 mg/m2 | Freq: Once | INTRAVENOUS | Status: AC
  Administered 2019-10-01: 650 mg via INTRAVENOUS
  Filled 2019-10-01: qty 13

## 2019-10-01 MED ORDER — SODIUM CHLORIDE 0.9 % IV SOLN
150.0000 mg | Freq: Once | INTRAVENOUS | Status: AC
  Administered 2019-10-01: 150 mg via INTRAVENOUS
  Filled 2019-10-01: qty 150

## 2019-10-01 MED ORDER — SODIUM CHLORIDE 0.9 % IV SOLN
10.0000 mg | Freq: Once | INTRAVENOUS | Status: AC
  Administered 2019-10-01: 10 mg via INTRAVENOUS
  Filled 2019-10-01: qty 10

## 2019-10-01 MED ORDER — SODIUM CHLORIDE 0.9 % IV SOLN
2000.0000 mg/m2 | INTRAVENOUS | Status: DC
  Administered 2019-10-01: 4400 mg via INTRAVENOUS
  Filled 2019-10-01: qty 88

## 2019-10-01 MED ORDER — ATROPINE SULFATE 0.4 MG/ML IJ SOLN
INTRAMUSCULAR | Status: AC
  Filled 2019-10-01: qty 1

## 2019-10-01 MED ORDER — PALONOSETRON HCL INJECTION 0.25 MG/5ML
0.2500 mg | Freq: Once | INTRAVENOUS | Status: AC
  Administered 2019-10-01: 0.25 mg via INTRAVENOUS

## 2019-10-01 MED ORDER — SODIUM CHLORIDE 0.9% FLUSH
10.0000 mL | Freq: Once | INTRAVENOUS | Status: AC
  Administered 2019-10-01: 10 mL
  Filled 2019-10-01: qty 10

## 2019-10-01 NOTE — Progress Notes (Signed)
[facility] OFFICE PROGRESS NOTE   Diagnosis: Small bowel carcinoma  INTERVAL HISTORY:   [name] complete another cycle of FOLFIRI on 09/17/2019.  She reports mild nausea following chemotherapy.  She had diarrhea approximately once daily.  Abdominal pain is under good control with the current MS Contin/Dilaudid regimen.  She does not need to use the breakthrough medication often.  Her appetite has improved.  She has noted increased numbness in the periphery.  This does not interfere with activity.  Objective:  Vital signs in last 24 hours:  Blood pressure 124/86, pulse (!) 105, temperature 98 F (36.7 C), temperature source Temporal, resp. rate 17, height _0  (1.803 m), weight 217 lb 3.2 oz (98.5 kg), SpO2 99 %.    Resp: Lungs clear bilaterally Cardio: Regular rate and rhythm GI: No hepatomegaly, nontender Vascular: No leg edema, left lower leg is larger than the right side  Skin: Palms without erythema  Portacath/PICC-without erythema  Lab Results:  Lab Results  Component Value Date   WBC 8.2 10/01/2019   HGB 11.3 (L) 10/01/2019   HCT 35.6 (L) 10/01/2019   MCV 93.2 10/01/2019   PLT 279 10/01/2019   NEUTROABS 4.9 10/01/2019    CMP  Lab Results  Component Value Date   NA 140 10/01/2019   K 3.5 10/01/2019   CL 101 10/01/2019   CO2 25 10/01/2019   GLUCOSE 111 (H) 10/01/2019   BUN 12 10/01/2019   CREATININE 0.80 10/01/2019   CALCIUM 9.4 10/01/2019   PROT 6.8 10/01/2019   ALBUMIN 3.9 10/01/2019   AST 48 (H) 10/01/2019   ALT 53 (H) 10/01/2019   ALKPHOS 147 (H) 10/01/2019   BILITOT 0.3 10/01/2019   GFRNONAA >60 10/01/2019   GFRAA >60 10/01/2019    Lab Results  Component Value Date   CEA1 146.80 (H) 09/17/2019     Medications: I have reviewed the patient's current medications.   Assessment/Plan: 1.Adenocarcinoma of the duodenum  CT abdomen/pelvis 06/16/2018-changes of pancreatitis, fullness at the head of the pancreas without a  defined mass, multiple subcentimeter peripancreatic lymph nodes, distal common bile duct stent, multiple nodules at the lung bases  EUS 06/14/2018-ulcerated mucosa/mass at the distal duodenal bulb with extension to the pancreas head, portal adenopathy, biopsy of duodenal mass-adenocarcinoma;foundation 1-microsatellite stable, tumor mutational burden 1, KRASG12D mutation  ERCP 06/14/2018-distal duodenal bulb mass, 1 cm proximal to the ampulla causing biliary obstruction, status post placement of a metal common bile duct stent  CT chest 06/20/2018-bilateral pulmonary nodules consistent with metastatic disease, supraclavicular, mediastinal, and hilar lymphadenopathy, changes of pancreatitis  Ultrasound-guided biopsy of a left supraclavicular lymph node 06/22/2018-metastatic adenocarcinoma  Cycle 1 FOLFOX 07/03/2018  Cycle 2 FOLFOX 07/17/2018  Cycle 3 FOLFOX 08/06/2018, Neulasta added  Cycle 4 FOLFOX 08/21/2018  CTs 09/06/2018-numerous small lung nodules generally decreased in size; new rounded masslike subpleural opacity right upper lobe with some evidence of internal cavitation,measuring 4.0 cm. Minimal fat stranding about the pancreas and duodenum which is decreased. Discrete mass is not noted. Previously seen common bile duct stent absent. Gallbladder wall thickening with faintly calcified gallstones. No biliary ductal dilatation.  Cycle 5 FOLFOX 09/10/2018(oxaliplatin dose reduceddue to elevated liver enzymes)  Cycle 6 FOLFOX 09/24/2018(oxaliplatin further dose reduced due to elevated liver enzymes)  Cycle 7 FOLFOX 10/09/2018  Cycle 8 FOLFOX 10/22/2018  CTs 11/12/2018-continued improvement in lung metastases.  Maintenance Xeloda, daily dosing 11/25/2018  CTs 02/04/2019-decrease in bilateral lung nodules, many have resolved, no evidence of disease progression, no evidence of  metastatic disease to the abdomen or pelvis  Xeloda continued  Xeloda placed on hold 02/26/2019 due to  hand-foot syndrome, resumed 10/21/2020at a dose of 1000 mg twice daily  05/13/2019-mixed response of (increased size of left upper lobe nodule, decreased size of right lower lobe nodule), new "shotty "abdominal retroperitoneal lymph nodes, new tiny nodules in the left abdominal omentum  Elevated CEA 05/13/2019  CTs12/28/2020-increased size of the left upper lobe nodule, decreased right lower lobe nodule, new small retroperitoneal nodes, new subcentimeter nodules in the left omental fat  Xeloda continued  Xeloda placed on hold 06/28/2019  CTs 07/09/2019-when enlarged pulmonary nodules, new right hilar and pretracheal lymph nodes, enlargement of retroperitoneal lymph nodes, increased omental nodularity and stranding, gallbladder wall thickening-new, increase in ascites  Cycle 1 FOLFIRI 07/15/2019  Cycle 2 FOLFIRI 08/06/2019, Neulasta  Cycle 3 FOLFIRI 08/20/2019, Neulasta  CT scan 08/30/2019-omental caking along the transverse mesocolon slightly more pronounced; discrete duodenal mass not seen; adenopathy at the level of the SMA stable and other subcentimeter retroperitoneal lymph nodes stable.  Mild periappendiceal fat stranding.  Cycle 4 FOLFIRI 09/04/2019, Neulasta  Cycle 5 FOLFIRI 09/17/2019, Irinotecan dose reduced due to neutropenia; Neulasta  Cycle 6 FOLFIRI 10/01/2019, Neulasta 2.Biliary obstruction secondary to the duodenal mass, status post placement of a bile duct stent 06/14/2018 3.Post ERCP pancreatitis 06/16/2018 4.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 Abdominal pain-secondary to the small bowel carcinoma versus pancreatitis  9. Rash-improved 10. Neutropenia secondary to chemotherapy 07/31/2018 11.Elevated transaminases 12.Oxaliplatin neuropathy 13.History of neutropenia  secondary to chemotherapy    Disposition: [name] appears stable.  She will complete another cycle of FOLFIRI today.  She will undergo restaging CTs on 10/18/2019.  She will be scheduled for an office visit 10/22/2019.  She will continue the current narcotic regimen.  Hailey Coder, MD  10/01/2019  9:41 AM

## 2019-10-01 NOTE — Patient Instructions (Signed)
[facility] Discharge Instructions for Patients Receiving Chemotherapy  Today you received the following chemotherapy agents: irinotecan, leucovorin, and 5FU.  To help prevent nausea and vomiting after your treatment, we encourage you to take your nausea medication as directed.   If you develop nausea and vomiting that is not controlled by your nausea medication, call the clinic.   BELOW ARE SYMPTOMS THAT SHOULD BE REPORTED IMMEDIATELY:  *FEVER GREATER THAN 100.5 F  *CHILLS WITH OR WITHOUT FEVER  NAUSEA AND VOMITING THAT IS NOT CONTROLLED WITH YOUR NAUSEA MEDICATION  *UNUSUAL SHORTNESS OF BREATH  *UNUSUAL BRUISING OR BLEEDING  TENDERNESS IN MOUTH AND THROAT WITH OR WITHOUT PRESENCE OF ULCERS  *URINARY PROBLEMS  *BOWEL PROBLEMS  UNUSUAL RASH Items with * indicate a potential emergency and should be followed up as soon as possible.  Feel free to call the clinic should you have any questions or concerns. The clinic phone number is [phone].  Please show the CHEMO ALERT CARD at check-in to the Emergency Department and triage nurse.   

## 2019-10-01 NOTE — Patient Instructions (Signed)

## 2019-10-02 ENCOUNTER — Telehealth: Payer: Self-pay | Admitting: Oncology

## 2019-10-02 NOTE — Telephone Encounter (Signed)
Scheduled per 5/18 los. Unable to reach pt. Left voicemail- appts made on 6/4 and 6/8.

## 2019-10-03 ENCOUNTER — Inpatient Hospital Stay: Payer: 59

## 2019-10-03 ENCOUNTER — Other Ambulatory Visit: Payer: Self-pay

## 2019-10-03 VITALS — BP 124/82 | Resp 18

## 2019-10-03 DIAGNOSIS — C170 Malignant neoplasm of duodenum: Secondary | ICD-10-CM

## 2019-10-03 DIAGNOSIS — Z5111 Encounter for antineoplastic chemotherapy: Secondary | ICD-10-CM | POA: Diagnosis not present

## 2019-10-03 MED ORDER — PEGFILGRASTIM INJECTION 6 MG/0.6ML ~~LOC~~
6.0000 mg | PREFILLED_SYRINGE | Freq: Once | SUBCUTANEOUS | Status: AC
  Administered 2019-10-03: 6 mg via SUBCUTANEOUS

## 2019-10-03 MED ORDER — HEPARIN SOD (PORK) LOCK FLUSH 100 UNIT/ML IV SOLN
500.0000 [IU] | Freq: Once | INTRAVENOUS | Status: AC | PRN
  Administered 2019-10-03: 500 [IU]
  Filled 2019-10-03: qty 5

## 2019-10-03 MED ORDER — PEGFILGRASTIM INJECTION 6 MG/0.6ML ~~LOC~~
PREFILLED_SYRINGE | SUBCUTANEOUS | Status: AC
  Filled 2019-10-03: qty 0.6

## 2019-10-03 MED ORDER — SODIUM CHLORIDE 0.9% FLUSH
10.0000 mL | INTRAVENOUS | Status: DC | PRN
  Administered 2019-10-03: 10 mL
  Filled 2019-10-03: qty 10

## 2019-10-03 NOTE — Patient Instructions (Signed)

## 2019-10-15 ENCOUNTER — Other Ambulatory Visit: Payer: Self-pay | Admitting: Oncology

## 2019-10-17 ENCOUNTER — Telehealth: Payer: Self-pay | Admitting: *Deleted

## 2019-10-17 NOTE — Telephone Encounter (Signed)
Called to request her lab/flush be moved from 6/4 to 6/7, day of scan. High priority scheduling message sent.

## 2019-10-18 ENCOUNTER — Inpatient Hospital Stay: Payer: 59

## 2019-10-21 ENCOUNTER — Ambulatory Visit (HOSPITAL_COMMUNITY)
Admission: RE | Admit: 2019-10-21 | Discharge: 2019-10-21 | Disposition: A | Discharge status: Home / Self Care | Payer: 59 | Source: Ambulatory Visit | Attending: Oncology | Admitting: Oncology

## 2019-10-21 ENCOUNTER — Inpatient Hospital Stay: Payer: 59

## 2019-10-21 ENCOUNTER — Inpatient Hospital Stay: Payer: 59 | Attending: Oncology

## 2019-10-21 ENCOUNTER — Other Ambulatory Visit: Payer: Self-pay

## 2019-10-21 DIAGNOSIS — Z8719 Personal history of other diseases of the digestive system: Secondary | ICD-10-CM | POA: Diagnosis not present

## 2019-10-21 DIAGNOSIS — D649 Anemia, unspecified: Secondary | ICD-10-CM | POA: Insufficient documentation

## 2019-10-21 DIAGNOSIS — Z5111 Encounter for antineoplastic chemotherapy: Secondary | ICD-10-CM | POA: Insufficient documentation

## 2019-10-21 DIAGNOSIS — C170 Malignant neoplasm of duodenum: Secondary | ICD-10-CM

## 2019-10-21 DIAGNOSIS — T451X5A Adverse effect of antineoplastic and immunosuppressive drugs, initial encounter: Secondary | ICD-10-CM | POA: Insufficient documentation

## 2019-10-21 DIAGNOSIS — C7800 Secondary malignant neoplasm of unspecified lung: Secondary | ICD-10-CM | POA: Diagnosis not present

## 2019-10-21 DIAGNOSIS — Z5189 Encounter for other specified aftercare: Secondary | ICD-10-CM | POA: Insufficient documentation

## 2019-10-21 DIAGNOSIS — G620 Drug-induced polyneuropathy: Secondary | ICD-10-CM | POA: Insufficient documentation

## 2019-10-21 DIAGNOSIS — Z95828 Presence of other vascular implants and grafts: Secondary | ICD-10-CM

## 2019-10-21 DIAGNOSIS — D701 Agranulocytosis secondary to cancer chemotherapy: Secondary | ICD-10-CM | POA: Insufficient documentation

## 2019-10-21 DIAGNOSIS — Z452 Encounter for adjustment and management of vascular access device: Secondary | ICD-10-CM | POA: Diagnosis not present

## 2019-10-21 LAB — CBC WITH DIFFERENTIAL (CANCER CENTER ONLY)
Abs Immature Granulocytes: 0.07 10*3/uL (ref 0.00–0.07)
Basophils Absolute: 0.1 10*3/uL (ref 0.0–0.1)
Basophils Relative: 1 %
Eosinophils Absolute: 0.1 10*3/uL (ref 0.0–0.5)
Eosinophils Relative: 1 %
HCT: 38.8 % (ref 36.0–46.0)
Hemoglobin: 12.3 g/dL (ref 12.0–15.0)
Immature Granulocytes: 1 %
Lymphocytes Relative: 20 %
Lymphs Abs: 1.8 10*3/uL (ref 0.7–4.0)
MCH: 28.5 pg (ref 26.0–34.0)
MCHC: 31.7 g/dL (ref 30.0–36.0)
MCV: 89.8 fL (ref 80.0–100.0)
Monocytes Absolute: 1 10*3/uL (ref 0.1–1.0)
Monocytes Relative: 11 %
Neutro Abs: 5.9 10*3/uL (ref 1.7–7.7)
Neutrophils Relative %: 66 %
Platelet Count: 437 10*3/uL — ABNORMAL HIGH (ref 150–400)
RBC: 4.32 MIL/uL (ref 3.87–5.11)
RDW: 17.9 % — ABNORMAL HIGH (ref 11.5–15.5)
WBC Count: 8.9 10*3/uL (ref 4.0–10.5)
nRBC: 0 % (ref 0.0–0.2)

## 2019-10-21 LAB — CMP (CANCER CENTER ONLY)
ALT: 32 U/L (ref 0–44)
AST: 29 U/L (ref 15–41)
Albumin: 4.2 g/dL (ref 3.5–5.0)
Alkaline Phosphatase: 142 U/L — ABNORMAL HIGH (ref 38–126)
Anion gap: 12 (ref 5–15)
BUN: 10 mg/dL (ref 6–20)
CO2: 26 mmol/L (ref 22–32)
Calcium: 9.9 mg/dL (ref 8.9–10.3)
Chloride: 102 mmol/L (ref 98–111)
Creatinine: 0.85 mg/dL (ref 0.44–1.00)
GFR, Est AFR Am: >60 mL/min (ref >60)
GFR, Estimated: >60 mL/min (ref >60)
Glucose, Bld: 123 mg/dL — ABNORMAL HIGH (ref 70–99)
Potassium: 4 mmol/L (ref 3.5–5.1)
Sodium: 140 mmol/L (ref 135–145)
Total Bilirubin: 0.5 mg/dL (ref 0.3–1.2)
Total Protein: 7.8 g/dL (ref 6.5–8.1)

## 2019-10-21 MED ORDER — SODIUM CHLORIDE 0.9% FLUSH
10.0000 mL | Freq: Once | INTRAVENOUS | Status: AC
  Administered 2019-10-21: 10 mL
  Filled 2019-10-21: qty 10

## 2019-10-21 MED ORDER — IOHEXOL 350 MG/ML SOLN: 100.0000 mL | Freq: Once | INTRAVENOUS | Status: DC | PRN

## 2019-10-21 MED ORDER — HEPARIN SOD (PORK) LOCK FLUSH 100 UNIT/ML IV SOLN
INTRAVENOUS | Status: AC
  Administered 2019-10-21: 500 [IU] via INTRAVENOUS
  Filled 2019-10-21: qty 5

## 2019-10-21 MED ORDER — SODIUM CHLORIDE (PF) 0.9 % IJ SOLN
INTRAMUSCULAR | Status: AC
  Filled 2019-10-21: qty 50

## 2019-10-21 MED ORDER — HEPARIN SOD (PORK) LOCK FLUSH 100 UNIT/ML IV SOLN
500.0000 [IU] | Freq: Once | INTRAVENOUS | Status: AC
  Administered 2019-10-21: 500 [IU]
  Filled 2019-10-21: qty 5

## 2019-10-21 MED ORDER — HEPARIN SOD (PORK) LOCK FLUSH 100 UNIT/ML IV SOLN: 500.0000 [IU] | Freq: Once | INTRAVENOUS | Status: AC

## 2019-10-21 MED ORDER — IOHEXOL 300 MG/ML  SOLN
100.0000 mL | Freq: Once | INTRAMUSCULAR | Status: AC | PRN
  Administered 2019-10-21: 100 mL via INTRAVENOUS

## 2019-10-22 ENCOUNTER — Other Ambulatory Visit: Payer: 59

## 2019-10-22 ENCOUNTER — Inpatient Hospital Stay: Payer: 59

## 2019-10-22 ENCOUNTER — Inpatient Hospital Stay (HOSPITAL_BASED_OUTPATIENT_CLINIC_OR_DEPARTMENT_OTHER): Payer: 59 | Admitting: Nurse Practitioner

## 2019-10-22 ENCOUNTER — Encounter: Payer: Self-pay | Admitting: Nurse Practitioner

## 2019-10-22 ENCOUNTER — Other Ambulatory Visit: Payer: Self-pay

## 2019-10-22 VITALS — HR 96

## 2019-10-22 VITALS — BP 129/95 | HR 113 | Temp 97.5°F | Resp 20 | Ht 71.0 in | Wt 216.4 lb

## 2019-10-22 DIAGNOSIS — C170 Malignant neoplasm of duodenum: Secondary | ICD-10-CM

## 2019-10-22 DIAGNOSIS — Z5111 Encounter for antineoplastic chemotherapy: Secondary | ICD-10-CM | POA: Diagnosis not present

## 2019-10-22 MED ORDER — SODIUM CHLORIDE 0.9 % IV SOLN
140.0000 mg/m2 | Freq: Once | INTRAVENOUS | Status: AC
  Administered 2019-10-22: 300 mg via INTRAVENOUS
  Filled 2019-10-22: qty 15

## 2019-10-22 MED ORDER — SODIUM CHLORIDE 0.9% FLUSH
10.0000 mL | INTRAVENOUS | Status: DC | PRN
  Filled 2019-10-22: qty 10

## 2019-10-22 MED ORDER — HYDROMORPHONE HCL 4 MG PO TABS: 4.0000 mg | ORAL_TABLET | Freq: Three times a day (TID) | ORAL | 0 refills | Status: DC | PRN

## 2019-10-22 MED ORDER — ATROPINE SULFATE 1 MG/ML IJ SOLN
INTRAMUSCULAR | Status: AC
  Filled 2019-10-22: qty 1

## 2019-10-22 MED ORDER — HYDROXYZINE HCL 25 MG PO TABS: 25.0000 mg | ORAL_TABLET | Freq: Three times a day (TID) | ORAL | 1 refills | Status: DC | PRN

## 2019-10-22 MED ORDER — ATROPINE SULFATE 1 MG/ML IJ SOLN
0.5000 mg | Freq: Once | INTRAMUSCULAR | Status: AC | PRN
  Administered 2019-10-22: 0.5 mg via INTRAVENOUS

## 2019-10-22 MED ORDER — SODIUM CHLORIDE 0.9 % IV SOLN
300.0000 mg/m2 | Freq: Once | INTRAVENOUS | Status: AC
  Administered 2019-10-22: 664 mg via INTRAVENOUS
  Filled 2019-10-22: qty 33.2

## 2019-10-22 MED ORDER — SODIUM CHLORIDE 0.9 % IV SOLN
150.0000 mg | Freq: Once | INTRAVENOUS | Status: AC
  Administered 2019-10-22: 150 mg via INTRAVENOUS
  Filled 2019-10-22: qty 150

## 2019-10-22 MED ORDER — SODIUM CHLORIDE 0.9 % IV SOLN
10.0000 mg | Freq: Once | INTRAVENOUS | Status: AC
  Administered 2019-10-22: 10 mg via INTRAVENOUS
  Filled 2019-10-22: qty 10

## 2019-10-22 MED ORDER — PALONOSETRON HCL INJECTION 0.25 MG/5ML
INTRAVENOUS | Status: AC
  Filled 2019-10-22: qty 5

## 2019-10-22 MED ORDER — SODIUM CHLORIDE 0.9 % IV SOLN
2000.0000 mg/m2 | INTRAVENOUS | Status: DC
  Administered 2019-10-22: 4400 mg via INTRAVENOUS
  Filled 2019-10-22: qty 88

## 2019-10-22 MED ORDER — SODIUM CHLORIDE 0.9 % IV SOLN
Freq: Once | INTRAVENOUS | Status: AC
  Filled 2019-10-22: qty 250

## 2019-10-22 MED ORDER — HEPARIN SOD (PORK) LOCK FLUSH 100 UNIT/ML IV SOLN
500.0000 [IU] | Freq: Once | INTRAVENOUS | Status: DC | PRN
  Filled 2019-10-22: qty 5

## 2019-10-22 MED ORDER — MORPHINE SULFATE ER 30 MG PO TBCR: 30.0000 mg | EXTENDED_RELEASE_TABLET | Freq: Two times a day (BID) | ORAL | 0 refills | Status: DC

## 2019-10-22 MED ORDER — FLUOROURACIL CHEMO INJECTION 2.5 GM/50ML
300.0000 mg/m2 | Freq: Once | INTRAVENOUS | Status: AC
  Administered 2019-10-22: 650 mg via INTRAVENOUS
  Filled 2019-10-22: qty 13

## 2019-10-22 MED ORDER — PALONOSETRON HCL INJECTION 0.25 MG/5ML
0.2500 mg | Freq: Once | INTRAVENOUS | Status: AC
  Administered 2019-10-22: 0.25 mg via INTRAVENOUS

## 2019-10-22 NOTE — Progress Notes (Addendum)
[place] OFFICE PROGRESS NOTE   Diagnosis: Small bowel carcinoma  INTERVAL HISTORY:   [name] returns as scheduled.  She completed cycle 6 FOLFIRI 10/01/2019.  She denies nausea/vomiting.  No mouth sores.  No diarrhea.  No hand or foot pain or redness.  She notes feet are dry.  Progressive fatigue.  Objective:  Vital signs in last 24 hours:  Blood pressure (!) 129/95, pulse (!) 113, temperature (!) 97.5 F (36.4 C), temperature source Temporal, resp. rate 20, height _0  (1.803 m), weight 216 lb 6.4 oz (98.2 kg), SpO2 96 %.    HEENT: No thrush or ulcers. GI: Abdomen soft and nontender.  No hepatomegaly. Vascular: No leg edema.  Calves soft and nontender. Neuro: Alert and oriented. Skin: Heels dry appearing. Port-A-Cath without erythema.   Lab Results:  Lab Results  Component Value Date   WBC 8.9 10/21/2019   HGB 12.3 10/21/2019   HCT 38.8 10/21/2019   MCV 89.8 10/21/2019   PLT 437 (H) 10/21/2019   NEUTROABS 5.9 10/21/2019    Imaging:  CT CHEST W CONTRAST  Result Date: 10/21/2019 CLINICAL DATA:  Restaging of small bowel carcinoma. Bilateral lower abdominal pain. EXAM: CT CHEST, ABDOMEN, AND PELVIS WITH CONTRAST TECHNIQUE: Multidetector CT imaging of the chest, abdomen and pelvis was performed following the standard protocol during bolus administration of intravenous contrast. CONTRAST:  139m OMNIPAQUE IOHEXOL 300 MG/ML  SOLN COMPARISON:  08/31/2019 abdominopelvic CT. CTA of the chest of 08/24/2019. FINDINGS: CT CHEST FINDINGS Cardiovascular: Right Port-A-Cath tip at high right atrium. Bovine arch. Normal heart size, without pericardial effusion. No central pulmonary embolism, on this non-dedicated study. Mediastinum/Nodes: No supraclavicular adenopathy. Node within the azygoesophageal recess measures 1.0 cm on 36/2 versus 1.4 cm on the prior exam (when remeasured). Right infrahilar adenopathy at 1.4 x 2.2 cm on 39/2. Compare 1.6 x 2.2 cm on the prior.  Lungs/Pleura: trace right pleural thickening. Bilateral pulmonary nodules, again suspicious for metastatic disease. An index left upper lobe pulmonary nodule anteriorly measures 6 mm on 57/7 versus 10 mm on the 08/24/2019 exam. Right lower lobe pulmonary nodules, including on images 93-94 of series 7 measure on the order of up to 4 mm. These measured on the order of 5 mm on the prior exam, and demonstrate new cavitation today, suggesting response to therapy. Other pulmonary nodules are similar and identified on series 7. No progressive pulmonary nodularity. Musculoskeletal: No acute osseous abnormality. CT ABDOMEN PELVIS FINDINGS Hepatobiliary: No focal liver lesion. No calcified gallstones. Mild gallbladder wall thickening is chronic. No pericholecystic edema. The intrahepatic ducts are upper normal. There is also periportal edema. No common duct dilatation or obstructive stone. Pancreas: Pancreatic atrophy. No duct dilatation or acute inflammation. Spleen: Normal in size, without focal abnormality. Adrenals/Urinary Tract: Normal adrenal glands. Normal left kidney. 4 mm upper pole right renal collecting system calculus. Normal urinary bladder. Stomach/Bowel: Normal stomach, without wall thickening. Colonic stool burden suggests constipation. Normal terminal ileum. Normal imaged appendix. No duodenal mass is seen.  No bowel obstruction. Vascular/Lymphatic: Aortic atherosclerosis. A preaortic node at the level of the SMA measures 1.2 cm on 74/2 versus 1.4 cm on the prior exam. 1.5 cm portacaval node on 69/2, similar (when remeasured). A jejunal mesenteric node measures 6 mm on 82/2 and is similar to on the prior (when remeasured). Reproductive: Intrauterine device. Small uterine fibroid. Bilateral ovarian follicles and cysts. Other: Similar small volume perihepatic ascites. Omental/peritoneal metastasis again identified. Example left abdominal omental somewhat ill-defined lesion measures  2.3 x 2.0 cm on 75/2 versus  2.9 x 2.1 cm on the prior exam (when remeasured). More inferior and diffuse caking within the omentum on 87/2 is favored to represent similar disease as was seen within right abdominal omentum on the prior exam. Musculoskeletal: No acute osseous abnormality. Degenerative disc disease is most significant at L2-3, presuming a transitional S1 vertebral body. IMPRESSION: CT CHEST IMPRESSION 1. Decrease in size of and progressive cavitation cavitation within pulmonary nodules, consistent with response to therapy of metastatic disease. 2. Response to therapy of thoracic adenopathy. CT ABDOMEN AND PELVIS IMPRESSION 1. Minimal improvement in abdominopelvic adenopathy/metastasis. 2. Minimal improvement in extensive peritoneal metastasis. 3. No bowel obstruction or other acute complication. 4. Similar trace perihepatic ascites. 5. Chronic nonspecific gallbladder wall thickening. 6.  Possible constipation. 7. Right nephrolithiasis. Electronically Signed   By: Abigail Miyamoto M.D.   On: 10/21/2019 20:59   CT ABDOMEN PELVIS W CONTRAST  Result Date: 10/21/2019 CLINICAL DATA:  Restaging of small bowel carcinoma. Bilateral lower abdominal pain. EXAM: CT CHEST, ABDOMEN, AND PELVIS WITH CONTRAST TECHNIQUE: Multidetector CT imaging of the chest, abdomen and pelvis was performed following the standard protocol during bolus administration of intravenous contrast. CONTRAST:  172m OMNIPAQUE IOHEXOL 300 MG/ML  SOLN COMPARISON:  08/31/2019 abdominopelvic CT. CTA of the chest of 08/24/2019. FINDINGS: CT CHEST FINDINGS Cardiovascular: Right Port-A-Cath tip at high right atrium. Bovine arch. Normal heart size, without pericardial effusion. No central pulmonary embolism, on this non-dedicated study. Mediastinum/Nodes: No supraclavicular adenopathy. Node within the azygoesophageal recess measures 1.0 cm on 36/2 versus 1.4 cm on the prior exam (when remeasured). Right infrahilar adenopathy at 1.4 x 2.2 cm on 39/2. Compare 1.6 x 2.2 cm on the  prior. Lungs/Pleura: trace right pleural thickening. Bilateral pulmonary nodules, again suspicious for metastatic disease. An index left upper lobe pulmonary nodule anteriorly measures 6 mm on 57/7 versus 10 mm on the 08/24/2019 exam. Right lower lobe pulmonary nodules, including on images 93-94 of series 7 measure on the order of up to 4 mm. These measured on the order of 5 mm on the prior exam, and demonstrate new cavitation today, suggesting response to therapy. Other pulmonary nodules are similar and identified on series 7. No progressive pulmonary nodularity. Musculoskeletal: No acute osseous abnormality. CT ABDOMEN PELVIS FINDINGS Hepatobiliary: No focal liver lesion. No calcified gallstones. Mild gallbladder wall thickening is chronic. No pericholecystic edema. The intrahepatic ducts are upper normal. There is also periportal edema. No common duct dilatation or obstructive stone. Pancreas: Pancreatic atrophy. No duct dilatation or acute inflammation. Spleen: Normal in size, without focal abnormality. Adrenals/Urinary Tract: Normal adrenal glands. Normal left kidney. 4 mm upper pole right renal collecting system calculus. Normal urinary bladder. Stomach/Bowel: Normal stomach, without wall thickening. Colonic stool burden suggests constipation. Normal terminal ileum. Normal imaged appendix. No duodenal mass is seen.  No bowel obstruction. Vascular/Lymphatic: Aortic atherosclerosis. A preaortic node at the level of the SMA measures 1.2 cm on 74/2 versus 1.4 cm on the prior exam. 1.5 cm portacaval node on 69/2, similar (when remeasured). A jejunal mesenteric node measures 6 mm on 82/2 and is similar to on the prior (when remeasured). Reproductive: Intrauterine device. Small uterine fibroid. Bilateral ovarian follicles and cysts. Other: Similar small volume perihepatic ascites. Omental/peritoneal metastasis again identified. Example left abdominal omental somewhat ill-defined lesion measures 2.3 x 2.0 cm on 75/2  versus 2.9 x 2.1 cm on the prior exam (when remeasured). More inferior and diffuse caking within the omentum on 87/2 is  favored to represent similar disease as was seen within right abdominal omentum on the prior exam. Musculoskeletal: No acute osseous abnormality. Degenerative disc disease is most significant at L2-3, presuming a transitional S1 vertebral body. IMPRESSION: CT CHEST IMPRESSION 1. Decrease in size of and progressive cavitation cavitation within pulmonary nodules, consistent with response to therapy of metastatic disease. 2. Response to therapy of thoracic adenopathy. CT ABDOMEN AND PELVIS IMPRESSION 1. Minimal improvement in abdominopelvic adenopathy/metastasis. 2. Minimal improvement in extensive peritoneal metastasis. 3. No bowel obstruction or other acute complication. 4. Similar trace perihepatic ascites. 5. Chronic nonspecific gallbladder wall thickening. 6.  Possible constipation. 7. Right nephrolithiasis. Electronically Signed   By: Abigail Miyamoto M.D.   On: 10/21/2019 20:59    Medications: I have reviewed the patient's current medications.  Assessment/Plan: 1.Adenocarcinoma of the duodenum  CT abdomen/pelvis 06/16/2018-changes of pancreatitis, fullness at the head of the pancreas without a defined mass, multiple subcentimeter peripancreatic lymph nodes, distal common bile duct stent, multiple nodules at the lung bases  EUS 06/14/2018-ulcerated mucosa/mass at the distal duodenal bulb with extension to the pancreas head, portal adenopathy, biopsy of duodenal mass-adenocarcinoma;foundation 1-microsatellite stable, tumor mutational burden 1, KRASG12D mutation  ERCP 06/14/2018-distal duodenal bulb mass, 1 cm proximal to the ampulla causing biliary obstruction, status post placement of a metal common bile duct stent  CT chest 06/20/2018-bilateral pulmonary nodules consistent with metastatic disease, supraclavicular, mediastinal, and hilar lymphadenopathy, changes of  pancreatitis  Ultrasound-guided biopsy of a left supraclavicular lymph node 06/22/2018-metastatic adenocarcinoma  Cycle 1 FOLFOX 07/03/2018  Cycle 2 FOLFOX 07/17/2018  Cycle 3 FOLFOX 08/06/2018, Neulasta added  Cycle 4 FOLFOX 08/21/2018  CTs 09/06/2018-numerous small lung nodules generally decreased in size; new rounded masslike subpleural opacity right upper lobe with some evidence of internal cavitation,measuring 4.0 cm. Minimal fat stranding about the pancreas and duodenum which is decreased. Discrete mass is not noted. Previously seen common bile duct stent absent. Gallbladder wall thickening with faintly calcified gallstones. No biliary ductal dilatation.  Cycle 5 FOLFOX 09/10/2018(oxaliplatin dose reduceddue to elevated liver enzymes)  Cycle 6 FOLFOX 09/24/2018(oxaliplatin further dose reduced due to elevated liver enzymes)  Cycle 7 FOLFOX 10/09/2018  Cycle 8 FOLFOX 10/22/2018  CTs 11/12/2018-continued improvement in lung metastases.  Maintenance Xeloda, daily dosing 11/25/2018  CTs 02/04/2019-decrease in bilateral lung nodules, many have resolved, no evidence of disease progression, no evidence of metastatic disease to the abdomen or pelvis  Xeloda continued  Xeloda placed on hold 02/26/2019 due to hand-foot syndrome, resumed 10/21/2020at a dose of 1000 mg twice daily  05/13/2019-mixed response of (increased size of left upper lobe nodule, decreased size of right lower lobe nodule), new "shotty "abdominal retroperitoneal lymph nodes, new tiny nodules in the left abdominal omentum  Elevated CEA 05/13/2019  CTs12/28/2020-increased size of the left upper lobe nodule, decreased right lower lobe nodule, new small retroperitoneal nodes, new subcentimeter nodules in the left omental fat  Xeloda continued  Xeloda placed on hold 06/28/2019  CTs 07/09/2019-when enlarged pulmonary nodules, new right hilar and pretracheal lymph nodes, enlargement of retroperitoneal lymph nodes,  increased omental nodularity and stranding, gallbladder wall thickening-new, increase in ascites  Cycle 1 FOLFIRI 07/15/2019  Cycle 2 FOLFIRI 08/06/2019, Neulasta  Cycle 3 FOLFIRI 08/20/2019, Neulasta  CT scan 08/30/2019-omental caking along the transverse mesocolon slightly more pronounced; discrete duodenal mass not seen; adenopathy at the level of the SMA stable and other subcentimeter retroperitoneal lymph nodes stable. Mild periappendiceal fat stranding.  Cycle 4 FOLFIRI 09/04/2019, Neulasta  Cycle 5 FOLFIRI 09/17/2019, Irinotecan  dose reduced due to neutropenia; Neulasta  Cycle 6 FOLFIRI 10/01/2019, Neulasta  CTs 10/21/2019-decrease in size of and progressive cavitation within pulmonary nodules. Decreased thoracic adenopathy. Minimal improvement in abdominopelvic adenopathy/metastasis. Minimal improvement in extensive peritoneal metastasis. 2.Biliary obstruction secondary to the duodenal mass, status post placement of a bile duct stent 06/14/2018 3.Post ERCP pancreatitis 06/16/2018 4.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 Abdominal pain-secondary to the small bowel carcinoma versus pancreatitis  9. Rash-improved 10. Neutropenia secondary to chemotherapy 07/31/2018 11.Elevated transaminases 12.Oxaliplatin neuropathy 13.History of neutropenia secondary to chemotherapy   Disposition: [name] appears stable.  She has completed 6 cycles of FOLFIRI.  Overall she is tolerating treatment well.  Restaging CT scans from yesterday shows stable to improved disease.  Results/images reviewed with her at today's appointment.  Dr. Benay Spice recommends continuation of FOLFIRI.  She agrees with this plan.  Proceed with cycle 7 FOLFIRI today as scheduled.  Treatment schedule will be adjusted to every 3 weeks  going forward.  We reviewed the CBC and chemistry panel from yesterday.  Labs adequate to proceed.  She will return for lab, follow-up, cycle 8 FOLFIRI in 3 weeks.  She will contact the office in the interim with any problems.  Patient seen with Dr. Benay Spice.  Ned Card ANP/GNP-BC   10/22/2019  10:25 AM This was a shared visit with Ned Card.  The restaging CTs are consistent with a response to therapy.  We reviewed the CT images with Ms. Oregel.  I recommend continuing FOLFIRI.  She agrees.  Julieanne Manson, MD

## 2019-10-22 NOTE — Patient Instructions (Signed)
[facility] Discharge Instructions for Patients Receiving Chemotherapy  Today you received the following chemotherapy agents Irinotecan (CAMPTOSAR), Leucovorin & Flourouracil (ADRUCIL).  To help prevent nausea and vomiting after your treatment, we encourage you to take your nausea medication as prescribed.    If you develop nausea and vomiting that is not controlled by your nausea medication, call the clinic.   BELOW ARE SYMPTOMS THAT SHOULD BE REPORTED IMMEDIATELY:  *FEVER GREATER THAN 100.5 F  *CHILLS WITH OR WITHOUT FEVER  NAUSEA AND VOMITING THAT IS NOT CONTROLLED WITH YOUR NAUSEA MEDICATION  *UNUSUAL SHORTNESS OF BREATH  *UNUSUAL BRUISING OR BLEEDING  TENDERNESS IN MOUTH AND THROAT WITH OR WITHOUT PRESENCE OF ULCERS  *URINARY PROBLEMS  *BOWEL PROBLEMS  UNUSUAL RASH Items with * indicate a potential emergency and should be followed up as soon as possible.  Feel free to call the clinic should you have any questions or concerns. The clinic phone number is [phone].  Please show the CHEMO ALERT CARD at check-in to the Emergency Department and triage nurse.   

## 2019-10-23 ENCOUNTER — Telehealth: Payer: Self-pay | Admitting: Oncology

## 2019-10-23 ENCOUNTER — Other Ambulatory Visit: Payer: Self-pay | Admitting: Oncology

## 2019-10-23 ENCOUNTER — Encounter: Payer: Self-pay | Admitting: *Deleted

## 2019-10-23 NOTE — Telephone Encounter (Signed)
Scheduled per 6/8 los. Unable to reach pt. Left voicemail. appts added on 6/10, 6/29, 7/1.

## 2019-10-23 NOTE — Progress Notes (Signed)
Received today a request for last office note and date of next appointment. Faxed note and appointment calendar with claim #[id number] to her account representative, [place] Crane at [phone].

## 2019-10-24 ENCOUNTER — Other Ambulatory Visit: Payer: Self-pay

## 2019-10-24 ENCOUNTER — Other Ambulatory Visit: Payer: Self-pay | Admitting: Oncology

## 2019-10-24 ENCOUNTER — Inpatient Hospital Stay: Payer: 59

## 2019-10-24 VITALS — BP 128/72 | HR 78 | Temp 98.2°F | Resp 18

## 2019-10-24 DIAGNOSIS — C170 Malignant neoplasm of duodenum: Secondary | ICD-10-CM

## 2019-10-24 DIAGNOSIS — Z5111 Encounter for antineoplastic chemotherapy: Secondary | ICD-10-CM | POA: Diagnosis not present

## 2019-10-24 MED ORDER — PEGFILGRASTIM INJECTION 6 MG/0.6ML ~~LOC~~
PREFILLED_SYRINGE | SUBCUTANEOUS | Status: AC
  Filled 2019-10-24: qty 0.6

## 2019-10-24 MED ORDER — HEPARIN SOD (PORK) LOCK FLUSH 100 UNIT/ML IV SOLN
500.0000 [IU] | Freq: Once | INTRAVENOUS | Status: AC | PRN
  Administered 2019-10-24: 500 [IU]
  Filled 2019-10-24: qty 5

## 2019-10-24 MED ORDER — SODIUM CHLORIDE 0.9% FLUSH
10.0000 mL | INTRAVENOUS | Status: DC | PRN
  Administered 2019-10-24: 10 mL
  Filled 2019-10-24: qty 10

## 2019-10-24 MED ORDER — PEGFILGRASTIM INJECTION 6 MG/0.6ML ~~LOC~~
6.0000 mg | PREFILLED_SYRINGE | Freq: Once | SUBCUTANEOUS | Status: AC
  Administered 2019-10-24: 6 mg via SUBCUTANEOUS

## 2019-10-24 NOTE — Patient Instructions (Signed)

## 2019-11-01 ENCOUNTER — Other Ambulatory Visit: Payer: Self-pay | Admitting: Oncology

## 2019-11-04 ENCOUNTER — Telehealth: Payer: Self-pay | Admitting: *Deleted

## 2019-11-04 ENCOUNTER — Other Ambulatory Visit: Payer: Self-pay | Admitting: Nurse Practitioner

## 2019-11-04 DIAGNOSIS — C170 Malignant neoplasm of duodenum: Secondary | ICD-10-CM

## 2019-11-04 MED ORDER — HYDROMORPHONE HCL 4 MG PO TABS: 4.0000 mg | ORAL_TABLET | Freq: Three times a day (TID) | ORAL | 0 refills | Status: DC | PRN

## 2019-11-04 NOTE — Telephone Encounter (Signed)
Called to report her current pain regimen is no longer providing relief. She is having more breakthrough pain. Asking to talk w/MD or Lattie Haw, NP.

## 2019-11-05 ENCOUNTER — Other Ambulatory Visit: Payer: Self-pay | Admitting: Oncology

## 2019-11-05 ENCOUNTER — Telehealth: Payer: Self-pay | Admitting: *Deleted

## 2019-11-05 ENCOUNTER — Telehealth: Payer: Self-pay | Admitting: Nurse Practitioner

## 2019-11-05 DIAGNOSIS — C170 Malignant neoplasm of duodenum: Secondary | ICD-10-CM

## 2019-11-05 MED ORDER — HYDROMORPHONE HCL 4 MG PO TABS
8.0000 mg | ORAL_TABLET | Freq: Four times a day (QID) | ORAL | 0 refills | Status: DC | PRN
Start: 2019-11-05 — End: 2019-11-19

## 2019-11-05 NOTE — Telephone Encounter (Signed)
Returned call to discuss pain management.  Message left to please call us back.

## 2019-11-05 NOTE — Telephone Encounter (Signed)
Reports she is needing to take her breakthrough pain med more often and more tablets now. Takes 2-3 every 4-6 hours for pain and has run out and pharmacy will not refill script sent on 11/04/19 due to "too early" based on directions. Does not think the morphine is not holding her as it had. Spoke w/pharmacy and the deactivated script from 6/21 and will await new order from MD. Per Dr. Benay Spice: He will reorder the hydromorphone with new directions, but prefers to keep the MS Contin at 30 mg q 12 hours.

## 2019-11-05 NOTE — Telephone Encounter (Signed)
Notified patient that MD agreed to increase her hydromorphone dose, but did not want to increase the morphine at this time. Script has been sent in for her.

## 2019-11-06 ENCOUNTER — Telehealth: Payer: Self-pay | Admitting: *Deleted

## 2019-11-06 NOTE — Telephone Encounter (Signed)
Pharmacy will not refill the hydromorphone. Is out of medication and needs help. Called pharmacy and spoke with Clair Gulling, PharmD. The order from 6/21 had not been deactivated. He has done this now and she may pick up the refill soon. Called patient back and informed her she should be able to get it now.

## 2019-11-07 ENCOUNTER — Ambulatory Visit (HOSPITAL_BASED_OUTPATIENT_CLINIC_OR_DEPARTMENT_OTHER): Payer: 59 | Admitting: Medical

## 2019-11-07 ENCOUNTER — Encounter (HOSPITAL_COMMUNITY): Payer: Self-pay | Admitting: Emergency Medicine

## 2019-11-07 ENCOUNTER — Other Ambulatory Visit: Payer: Self-pay

## 2019-11-07 ENCOUNTER — Ambulatory Visit: Payer: 59

## 2019-11-07 ENCOUNTER — Emergency Department (HOSPITAL_COMMUNITY): Payer: 59

## 2019-11-07 ENCOUNTER — Telehealth: Payer: Self-pay | Admitting: *Deleted

## 2019-11-07 ENCOUNTER — Emergency Department (HOSPITAL_COMMUNITY)
Admission: EM | Admit: 2019-11-07 | Discharge: 2019-11-07 | Disposition: A | Discharge status: Home / Self Care | Payer: 59 | Attending: Emergency Medicine | Admitting: Emergency Medicine

## 2019-11-07 DIAGNOSIS — N200 Calculus of kidney: Secondary | ICD-10-CM | POA: Insufficient documentation

## 2019-11-07 DIAGNOSIS — R1030 Lower abdominal pain, unspecified: Secondary | ICD-10-CM

## 2019-11-07 DIAGNOSIS — C170 Malignant neoplasm of duodenum: Secondary | ICD-10-CM

## 2019-11-07 DIAGNOSIS — F17200 Nicotine dependence, unspecified, uncomplicated: Secondary | ICD-10-CM | POA: Diagnosis not present

## 2019-11-07 LAB — CBC
HCT: 40.1 % (ref 36.0–46.0)
Hemoglobin: 12.8 g/dL (ref 12.0–15.0)
MCH: 29 pg (ref 26.0–34.0)
MCHC: 31.9 g/dL (ref 30.0–36.0)
MCV: 90.7 fL (ref 80.0–100.0)
Platelets: 378 10*3/uL (ref 150–400)
RBC: 4.42 MIL/uL (ref 3.87–5.11)
RDW: 18.5 % — ABNORMAL HIGH (ref 11.5–15.5)
WBC: 10.1 10*3/uL (ref 4.0–10.5)
nRBC: 0 % (ref 0.0–0.2)

## 2019-11-07 LAB — URINALYSIS, ROUTINE W REFLEX MICROSCOPIC
Bilirubin Urine: NEGATIVE
Glucose, UA: NEGATIVE mg/dL
Hgb urine dipstick: NEGATIVE
Ketones, ur: NEGATIVE mg/dL
Leukocytes,Ua: NEGATIVE
Nitrite: NEGATIVE
Protein, ur: NEGATIVE mg/dL
Specific Gravity, Urine: 1.008 (ref 1.005–1.030)
pH: 6 (ref 5.0–8.0)

## 2019-11-07 LAB — COMPREHENSIVE METABOLIC PANEL
ALT: 32 U/L (ref 0–44)
AST: 25 U/L (ref 15–41)
Albumin: 4.8 g/dL (ref 3.5–5.0)
Alkaline Phosphatase: 119 U/L (ref 38–126)
Anion gap: 11 (ref 5–15)
BUN: 8 mg/dL (ref 6–20)
CO2: 28 mmol/L (ref 22–32)
Calcium: 9.4 mg/dL (ref 8.9–10.3)
Chloride: 98 mmol/L (ref 98–111)
Creatinine, Ser: 0.68 mg/dL (ref 0.44–1.00)
GFR calc Af Amer: >60 mL/min (ref >60)
GFR calc non Af Amer: >60 mL/min (ref >60)
Glucose, Bld: 119 mg/dL — ABNORMAL HIGH (ref 70–99)
Potassium: 4.2 mmol/L (ref 3.5–5.1)
Sodium: 137 mmol/L (ref 135–145)
Total Bilirubin: 0.4 mg/dL (ref 0.3–1.2)
Total Protein: 8 g/dL (ref 6.5–8.1)

## 2019-11-07 LAB — I-STAT BETA HCG BLOOD, ED (MC, WL, AP ONLY): I-stat hCG, quantitative: <5 m[IU]/mL (ref <5)

## 2019-11-07 LAB — LIPASE, BLOOD: Lipase: 19 U/L (ref 11–51)

## 2019-11-07 MED ORDER — IOHEXOL 300 MG/ML  SOLN
100.0000 mL | Freq: Once | INTRAMUSCULAR | Status: AC | PRN
  Administered 2019-11-07: 100 mL via INTRAVENOUS

## 2019-11-07 MED ORDER — ONDANSETRON HCL 4 MG/2ML IJ SOLN
4.0000 mg | Freq: Once | INTRAMUSCULAR | Status: AC
  Administered 2019-11-07: 4 mg via INTRAVENOUS
  Filled 2019-11-07: qty 2

## 2019-11-07 MED ORDER — HYDROMORPHONE HCL 1 MG/ML IJ SOLN
1.0000 mg | Freq: Once | INTRAMUSCULAR | Status: AC
  Administered 2019-11-07: 1 mg via INTRAVENOUS
  Filled 2019-11-07: qty 1

## 2019-11-07 MED ORDER — FENTANYL CITRATE (PF) 100 MCG/2ML IJ SOLN
100.0000 ug | Freq: Once | INTRAMUSCULAR | Status: AC
  Administered 2019-11-07: 100 ug via INTRAVENOUS
  Filled 2019-11-07: qty 2

## 2019-11-07 MED ORDER — SODIUM CHLORIDE (PF) 0.9 % IJ SOLN
INTRAMUSCULAR | Status: AC
  Filled 2019-11-07: qty 50

## 2019-11-07 MED ORDER — SODIUM CHLORIDE 0.9% FLUSH
3.0000 mL | Freq: Once | INTRAVENOUS | Status: AC
  Administered 2019-11-07: 3 mL via INTRAVENOUS

## 2019-11-07 NOTE — Telephone Encounter (Signed)
Sister called to report she has started vomiting and having diarrhea with extreme abdominal pain beginning last night. Wants to take her to ER , but patient said she was told not to go to ER. Per Benay Spice: She is high risk for bowel obstruction. Will not have time to turn her around here today. Needs to go to ER. Notified patient and she agrees to go.

## 2019-11-07 NOTE — Discharge Instructions (Signed)
As we discussed, your work-up today looked reassuring.  Call your oncologist tomorrow and arrange for a follow-up appointment to discuss your pain medication regimen.  Return to the emergency department for any worsening pain, vomiting, fever or any other worsening concerning symptoms.

## 2019-11-07 NOTE — ED Provider Notes (Signed)
[facility] DEPT Provider Note   CSN: [id number] Arrival date & time: 11/07/19  1323     History Chief Complaint  Patient presents with  . Abdominal Pain  . Dysuria  . Emesis  . Diarrhea    Hailey Houston is a 49 y.o. 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  She reports that last night, she started developing some lower domino pain, particularly on the left.  She states that she has Dilaudid at home and she took her pain medication with no improvement. She also reports she has had nausea/vomiting/diarrhea since last night. She reports emesis is NBNB and no blood noted in stool. She has had some dysuria but denies any hematuria. She called the cancer center and was advised to come to the emergency department for further evaluation.  She denies any fevers, chest pain, difficulty breathing.  The history is provided by the patient.       Past Medical History:  Diagnosis Date  . ADD (attention deficit disorder)   . Adenocarcinoma of duodenum ([place]) 06/16/2018  . Anxiety   . Family history of adverse reaction to anesthesia    mom goes into afib  . Wears glasses    driving    Patient Active Problem List   Diagnosis Date Noted  . Port-A-Cath in place 07/09/2018  . Goals of care, counseling/discussion 06/26/2018  . Epigastric pain   . Malnutrition of moderate degree 06/22/2018  . Adenocarcinoma of duodenum ([place]) 06/16/2018  . Pancreatitis 06/16/2018  . Hypokalemia 06/16/2018  . Dehydration 06/16/2018  . Nausea and vomiting 06/16/2018  . Acute pancreatitis 06/16/2018  . Pulmonary nodules 06/16/2018  . Abnormal CT scan   . Common bile duct (CBD) stricture   . Dilation of biliary tract   . Duodenal mass   . Attention deficit hyperactivity disorder (ADHD) 05/27/2018  . Tibia fracture 08/29/2013    Past Surgical History:  Procedure Laterality Date  .  BILIARY STENT PLACEMENT N/A 06/14/2018   Procedure: BILIARY STENT PLACEMENT;  Surgeon: Milus Banister, MD;  Location: [facility];  Service: Endoscopy;  Laterality: N/A;  . BIOPSY  06/14/2018   Procedure: BIOPSY;  Surgeon: Milus Banister, MD;  Location: [facility];  Service: Endoscopy;;  . ENDOSCOPIC RETROGRADE CHOLANGIOPANCREATOGRAPHY (ERCP) WITH PROPOFOL N/A 06/14/2018   Procedure: ENDOSCOPIC RETROGRADE CHOLANGIOPANCREATOGRAPHY (ERCP) WITH PROPOFOL;  Surgeon: Milus Banister, MD;  Location: [facility];  Service: Endoscopy;  Laterality: N/A;  . ESOPHAGOGASTRODUODENOSCOPY N/A 06/14/2018   Procedure: ESOPHAGOGASTRODUODENOSCOPY (EGD);  Surgeon: Milus Banister, MD;  Location: [facility];  Service: Endoscopy;  Laterality: N/A;  . EUS N/A 06/14/2018   Procedure: UPPER ENDOSCOPIC ULTRASOUND (EUS) RADIAL;  Surgeon: Milus Banister, MD;  Location: [facility];  Service: Endoscopy;  Laterality: N/A;  . IR IMAGING GUIDED PORT INSERTION  07/06/2018  . ORIF ANKLE FRACTURE Left 08/29/2013   Procedure: LEFT FIBULA -FRACTURE OPEN TREATMENT DISTAL FIBULAR (LATERALERAL MALLEOLUS) INCLUDES INTERNAL FIXATION, ;  Surgeon: [name], MD;  Location: [facility];  Service: Orthopedics;  Laterality: Left;  . PERINEAL LACERATION REPAIR     post childbirth  . SPHINCTEROTOMY  06/14/2018   Procedure: SPHINCTEROTOMY;  Surgeon: Milus Banister, MD;  Location: [facility];  Service: Endoscopy;;  . TIBIA IM NAIL INSERTION Left 08/29/2013   Procedure: FRACTURE TREATMENT TIBIAL SHAFT BY INTRAMEDULLARY IMPLANT WITH SCREWS ;  Surgeon: [name], MD;  Location: [facility];  Service: Orthopedics;  Laterality: Left;     OB History   No obstetric history on file.     Family History  Problem Relation Age of Onset  . Heart disease Mother   . Heart disease Father     Social History   Tobacco Use  . Smoking status: Current Some Day Smoker  . Smokeless tobacco: Never Used    Substance Use Topics  . Alcohol use: Yes    Comment: occ  . Drug use: No    Home Medications Prior to Admission medications   Medication Sig Start Date End Date Taking? Authorizing Provider  amphetamine-dextroamphetamine (ADDERALL) 30 MG tablet Take 1 tablet by mouth 2 (two) times daily. 07/01/19  Yes Cottle, Billey Co., MD  cetirizine (ZYRTEC) 10 MG tablet Take 10 mg by mouth daily as needed for allergies.   Yes [provider]  dexamethasone (DECADRON) 4 MG tablet TAKE 2 TABLETS TWICE A DAY, START DAY AFTER CHEMOTHERAPY. Patient taking differently: Take 4 mg by mouth 2 (two) times daily. Take 2 tablets twice a day, start day after chemotherapy. 10/24/19  Yes Ladell Pier, MD  docusate sodium (COLACE) 100 MG capsule Take 2 capsules (200 mg total) by mouth 2 (two) times daily as needed for mild constipation. 07/17/19  Yes Ladell Pier, MD  escitalopram (LEXAPRO) 5 MG tablet Take 1 tablet (5 mg total) by mouth daily. 08/20/19  Yes Ladell Pier, MD  famotidine ([employer] ACID CONTROLLER MAX ST) 20 MG tablet TAKE 1 TABLET BY MOUTH TWICE A DAY *INS PREFERS OTC* Patient taking differently: Take 20 mg by mouth 2 (two) times daily.  11/01/19  Yes Ladell Pier, MD  HYDROmorphone (DILAUDID) 4 MG tablet Take 2-3 tablets (8-12 mg total) by mouth every 6 (six) hours as needed for severe pain. Patient taking differently: Take 8 mg by mouth every 6 (six) hours as needed for moderate pain or severe pain.  11/05/19  Yes Ladell Pier, MD  hydrOXYzine (ATARAX/VISTARIL) 25 MG tablet Take 1 tablet (25 mg total) by mouth every 8 (eight) hours as needed. Patient taking differently: Take 25 mg by mouth every 8 (eight) hours as needed for anxiety or itching.  10/22/19  Yes Owens Shark, NP  LORazepam (ATIVAN) 0.5 MG tablet TAKE 1 TABLET EVERY 6 HOURS AS NEEDED FOR ANXIETY OR NAUSEA Patient taking differently: Take 0.5 mg by mouth every 6 (six) hours as needed for anxiety (nausea). TAKE 1 TABLET EVERY  6 HOURS AS NEEDED FOR ANXIETY OR NAUSEA 08/20/19  Yes Ladell Pier, MD  morphine (MS CONTIN) 30 MG 12 hr tablet Take 1 tablet (30 mg total) by mouth every 12 (twelve) hours. 10/22/19  Yes Owens Shark, NP  ondansetron (ZOFRAN ODT) 8 MG disintegrating tablet Take 1 tablet (8 mg total) by mouth every 8 (eight) hours as needed for nausea or vomiting. 08/24/19  Yes Lajean Saver, MD  pantoprazole (PROTONIX) 40 MG tablet TAKE 1 TABLET BY MOUTH TWICE A DAY Patient taking differently: Take 40 mg by mouth 2 (two) times daily.  07/24/19  Yes Ladell Pier, MD  Polyethylene Glycol 3350 (MIRALAX PO) Take 17 g by mouth daily as needed (constipation).  07/03/18  Yes [provider]  potassium chloride SA (KLOR-CON) 20 MEQ tablet Take 1 tablet (20 mEq total) by mouth daily. 07/09/19  Yes Owens Shark, NP  prochlorperazine (COMPAZINE) 10 MG tablet TAKE 1 TABLET BY MOUTH EVERY 6 HOURS  AS NEEDED FOR NAUSEA AND VOMITING Patient taking differently: Take 10 mg by mouth every 6 (six) hours as needed for nausea.  10/25/19  Yes Ladell Pier, MD  zaleplon (SONATA) 10 MG capsule Take 1 capsule (10 mg total) by mouth at bedtime as needed for sleep. 07/01/19  Yes Cottle, Billey Co., MD  ondansetron (ZOFRAN) 8 MG tablet Take 1 tablet every 8 hours as needed for nausea. Patient not taking: Reported on 08/31/2019 04/09/19   Ladell Pier, MD    Allergies    Oxycodone  Review of Systems   Review of Systems  Constitutional: Negative for fever.  Respiratory: Negative for cough and shortness of breath.   Cardiovascular: Negative for chest pain.  Gastrointestinal: Positive for abdominal pain, diarrhea, nausea and vomiting.  Genitourinary: Positive for dysuria. Negative for hematuria.  Neurological: Negative for headaches.  All other systems reviewed and are negative.   Physical Exam Updated Vital Signs BP (!) 168/97 (BP Location: Right Arm)   Pulse 91   Temp 98.3 F (36.8 C) (Oral)   Resp 17   SpO2  98%   Physical Exam Vitals and nursing note reviewed.  Constitutional:      Appearance: Normal appearance. She is well-developed.  HENT:     Head: Normocephalic and atraumatic.  Eyes:     General: Lids are normal.     Conjunctiva/sclera: Conjunctivae normal.     Pupils: Pupils are equal, round, and reactive to light.  Cardiovascular:     Rate and Rhythm: Normal rate and regular rhythm.     Pulses: Normal pulses.     Heart sounds: Normal heart sounds. No murmur heard.  No friction rub. No gallop.   Pulmonary:     Effort: Pulmonary effort is normal.     Breath sounds: Normal breath sounds.     Comments: Lungs clear to auscultation bilaterally.  Symmetric chest rise.  No wheezing, rales, rhonchi. Abdominal:     General: Bowel sounds are decreased.     Palpations: Abdomen is soft. Abdomen is not rigid.     Tenderness: There is abdominal tenderness in the left lower quadrant. There is no guarding.     Comments: Abdomen is soft, non-distended. Slightly decreased bowel sounds. Tenderness to palpation noted to the LLQ. No rigidity, guarding. No CVA tenderness noted bilaterally.   Musculoskeletal:        General: Normal range of motion.     Cervical back: Full passive range of motion without pain.  Skin:    General: Skin is warm and dry.     Capillary Refill: Capillary refill takes less than 2 seconds.  Neurological:     Mental Status: She is alert and oriented to person, place, and time.  Psychiatric:        Speech: Speech normal.     ED Results / Procedures / Treatments   Labs (all labs ordered are listed, but only abnormal results are displayed) Labs Reviewed  COMPREHENSIVE METABOLIC PANEL - Abnormal; Notable for the following components:      Result Value   Glucose, Bld 119 (*)    All other components within normal limits  CBC - Abnormal; Notable for the following components:   RDW 18.5 (*)    All other components within normal limits  LIPASE, BLOOD  URINALYSIS, ROUTINE  W REFLEX MICROSCOPIC  I-STAT BETA HCG BLOOD, ED ([facility], [facility], [facility] ONLY)    EKG None  Radiology CT ABDOMEN PELVIS W CONTRAST  Result Date: 11/07/2019  CLINICAL DATA:  49 year old female with abdominal pain. History of small-bowel carcinoma. EXAM: CT ABDOMEN AND PELVIS WITH CONTRAST TECHNIQUE: Multidetector CT imaging of the abdomen and pelvis was performed using the standard protocol following bolus administration of intravenous contrast. CONTRAST:  172mL OMNIPAQUE IOHEXOL 300 MG/ML  SOLN COMPARISON:  CT abdomen pelvis dated 08/31/2019. FINDINGS: Lower chest: Small focal right lung base subpleural scarring. The visualized lung bases are otherwise clear. Partially visualized central venous line with tip at the cavoatrial junction. No intra-abdominal free air. Small ascites similar to prior CT. Hepatobiliary: Fatty infiltration of the liver. There is mild intrahepatic biliary ductal dilatation versus mild periportal edema as seen previously. Correlation with LFTs recommended. Diffuse gallbladder wall thickening as seen on the prior CTs. There is a noncalcified stone in the neck of the gallbladder. Ultrasound may provide better evaluation of the gallbladder if there is clinical concern for acute cholecystitis. Pancreas: The pancreas is atrophic for age. No dilatation of the main pancreatic duct. Spleen: Normal in size without focal abnormality. Adrenals/Urinary Tract: The adrenal glands are unremarkable. There is a 5 mm nonobstructing right renal upper pole calculus. There is no hydronephrosis on either side. There is symmetric enhancement and excretion of contrast by both kidneys. The visualized ureters and urinary bladder are unremarkable. Stomach/Bowel: There is moderate stool throughout the colon. There is no bowel obstruction. The appendix is normal. Vascular/Lymphatic: The abdominal aorta and IVC are unremarkable. No portal venous gas. A 1 cm nodular density anterior to the SMV (40/2) may represent a mildly  enlarged lymph node or mesenteric implant. An enlarged lymph node anterior to the left renal vein (32/2) measures approximately 12 mm similar to prior CT. There is diffuse nodularity of the omentum similar to prior CT consistent with omental implants. Reproductive: The uterus is anteverted. An intrauterine device is noted. Suboptimal visualization the ovaries. Probable bilateral ovarian follicles. Ultrasound may provide better evaluation of the pelvic structures. Other: None Musculoskeletal: Mild degenerative changes of the spine. No acute osseous pathology. IMPRESSION: 1. Diffuse gallbladder wall thickening with a noncalcified stone in the neck of the gallbladder. Findings may be chronic and related to underlying systemic process and ascites. Ultrasound may provide better evaluation of the gallbladder if there is clinical concern for acute cholecystitis. 2. Fatty liver. 3. Small ascites similar to prior CT. 4. Omental implants similar to prior CT. 5. No bowel obstruction. Normal appendix. 6. A 5 mm nonobstructing right renal upper pole calculus. No hydronephrosis. 7. No interval change in the retroperitoneal adenopathy. Electronically Signed   By: Anner Crete M.D.   On: 11/07/2019 17:30    Procedures Procedures (including critical care time)  Medications Ordered in ED Medications  sodium chloride (PF) 0.9 % injection (has no administration in time range)  sodium chloride flush (NS) 0.9 % injection 3 mL (3 mLs Intravenous Given 11/07/19 1637)  ondansetron (ZOFRAN) injection 4 mg (4 mg Intravenous Given 11/07/19 1612)  fentaNYL (SUBLIMAZE) injection 100 mcg (100 mcg Intravenous Given 11/07/19 1611)  HYDROmorphone (DILAUDID) injection 1 mg (1 mg Intravenous Given 11/07/19 1637)  iohexol (OMNIPAQUE) 300 MG/ML solution 100 mL (100 mLs Intravenous Contrast Given 11/07/19 1701)  HYDROmorphone (DILAUDID) injection 1 mg (1 mg Intravenous Given 11/07/19 1823)  ondansetron (ZOFRAN) injection 4 mg (4 mg  Intravenous Given 11/07/19 1831)    ED Course  I have reviewed the triage vital signs and the nursing notes.  Pertinent labs & imaging results that were available during my care of the patient were reviewed by  me and considered in my medical decision making (see chart for details).    MDM Rules/Calculators/A&P                          49 year old female with past medical history of adenocarcinoma of her duodenum who presents for evaluation of abdominal pain, nausea/vomiting, diarrhea and dysuria that began last night.  States she took her at home pain medications with no improvement in pain, prompting ED visit.  On initially arrival, she is afebrile, nontoxic-appearing.  Vital signs are stable.  She does have some abdominal tenderness noted particularly in the left lower quadrant.  Concern for bowel obstruction versus diverticulitis versus infectious etiology.  Plan for labs, CT scan.  CMP unremarkable.  CBC shows no leukocytosis or anemia.  Lipase is normal. I-stat beta negative. UA negative for any infectious etiology.   CT scan shows diffuse gallbladder wall thickening with noncalcified stone in the neck of the gallbladder.  She does not have any other surrounding inflammatory signs.  She has small ascites similar to prior CT.  No bowel obstruction.  Appendix is normal.  She has a 5 mm nonobstructing right renal stone.  No interval change in retroperitoneal adenopathy.  Patient still having some pain.  She is requesting additional dose of analgesics.  We will give her pain medication, nausea medication.  I discussed results with patient.  She reports her pain is improved.  She does appear more comfortable.  She states she still has some discomfort but states it is overall improved.  I discussed with her that her work-up today does not show any obvious reason for her pain.  I did explain that this could be pain related to her cancer.  We discussed options here in the emergency department for  discharge home and offered her follow-up with oncology tomorrow versus admission for IV pain control.  After extensive discussion, patient opted for discharge home with follow-up with oncology tomorrow.  Given her overall well appearance, I feel that this is reasonable. Discussed patient with Dr. Darl Householder who is agreeable to plan. At this time, patient exhibits no emergent life-threatening condition that require further evaluation in ED or admission. Patient had ample opportunity for questions and discussion. All patient's questions were answered with full understanding. Strict return precautions discussed. Patient expresses understanding and agreement to plan.   Portions of this note were generated with [employer]. Dictation errors may occur despite best attempts at proofreading.   Final Clinical Impression(s) / ED Diagnoses Final diagnoses:  Lower abdominal pain    Rx / DC Orders ED Discharge Orders    None       Desma Mcgregor 11/07/19 1916    Drenda Freeze, MD 11/07/19 [phone]

## 2019-11-07 NOTE — ED Triage Notes (Signed)
Pt c/o lower abd pains with n/v/d and dysuria that started last night.

## 2019-11-08 ENCOUNTER — Inpatient Hospital Stay (HOSPITAL_BASED_OUTPATIENT_CLINIC_OR_DEPARTMENT_OTHER): Payer: 59 | Admitting: Oncology

## 2019-11-08 ENCOUNTER — Telehealth: Payer: Self-pay | Admitting: *Deleted

## 2019-11-08 DIAGNOSIS — C170 Malignant neoplasm of duodenum: Secondary | ICD-10-CM

## 2019-11-08 MED ORDER — MORPHINE SULFATE ER 60 MG PO TBCR: 60.0000 mg | EXTENDED_RELEASE_TABLET | Freq: Two times a day (BID) | ORAL | 0 refills | Status: DC

## 2019-11-08 NOTE — Telephone Encounter (Addendum)
Left VM to f/u on her pain status today and inquire if she feels she needs to be seen today. Requested return call. Patient called back and would like a telephone visit w/Dr. Benay Spice today to review her ER visit and discuss pain management before the weekend.  MD agrees to 2:15 pm today.

## 2019-11-08 NOTE — Progress Notes (Signed)
[facility] OFFICE VISIT PROGRESS NOTE  I connected with Hailey Houston on 11/08/19 at  3:15 PM EDT by telephone and verified that I am speaking with the correct person using two identifiers.   I discussed the limitations, risks, security and privacy concerns of performing an evaluation and management service by telemedicine and the availability of in-person appointments. I also discussed with the patient that there may be a patient responsible charge related to this service. The patient expressed understanding and agreed to proceed.  Other persons participating in the visit and their role in the encounter: Sister  Patient's location: Home Provider's location: Office   Diagnosis: Small bowel carcinoma  INTERVAL HISTORY:   Hailey Houston completed another cycle of FOLFIRI on 10/22/2019.  She developed increased abdominal pain approximately 2 days prior to presenting to the emergency room yesterday.  No fever.  She reports nausea and one episode of vomiting.  She is having bowel movements.  She was treated with intravenous narcotics in the emergency room and the pain improved.  A CT revealed no acute change. Hailey Houston reports taking MS Contin at a dose of 30 mg twice daily.  She is taking Dilaudid every 4 hours for breakthrough pain.  Objective:  Lab Results:  Lab Results  Component Value Date   WBC 10.1 11/07/2019   HGB 12.8 11/07/2019   HCT 40.1 11/07/2019   MCV 90.7 11/07/2019   PLT 378 11/07/2019   NEUTROABS 5.9 10/21/2019    Imaging:  CT ABDOMEN PELVIS W CONTRAST  Result Date: 11/07/2019 CLINICAL DATA:  49 year old female with abdominal pain. History of small-bowel carcinoma. EXAM: CT ABDOMEN AND PELVIS WITH CONTRAST TECHNIQUE: Multidetector CT imaging of the abdomen and pelvis was performed using the standard protocol following bolus administration of intravenous contrast. CONTRAST:  164m OMNIPAQUE IOHEXOL 300 MG/ML  SOLN  COMPARISON:  CT abdomen pelvis dated 08/31/2019. FINDINGS: Lower chest: Small focal right lung base subpleural scarring. The visualized lung bases are otherwise clear. Partially visualized central venous line with tip at the cavoatrial junction. No intra-abdominal free air. Small ascites similar to prior CT. Hepatobiliary: Fatty infiltration of the liver. There is mild intrahepatic biliary ductal dilatation versus mild periportal edema as seen previously. Correlation with LFTs recommended. Diffuse gallbladder wall thickening as seen on the prior CTs. There is a noncalcified stone in the neck of the gallbladder. Ultrasound may provide better evaluation of the gallbladder if there is clinical concern for acute cholecystitis. Pancreas: The pancreas is atrophic for age. No dilatation of the main pancreatic duct. Spleen: Normal in size without focal abnormality. Adrenals/Urinary Tract: The adrenal glands are unremarkable. There is a 5 mm nonobstructing right renal upper pole calculus. There is no hydronephrosis on either side. There is symmetric enhancement and excretion of contrast by both kidneys. The visualized ureters and urinary bladder are unremarkable. Stomach/Bowel: There is moderate stool throughout the colon. There is no bowel obstruction. The appendix is normal. Vascular/Lymphatic: The abdominal aorta and IVC are unremarkable. No portal venous gas. A 1 cm nodular density anterior to the SMV (40/2) may represent a mildly enlarged lymph node or mesenteric implant. An enlarged lymph node anterior to the left renal vein (32/2) measures approximately 12 mm similar to prior CT. There is diffuse nodularity of the omentum similar to prior CT consistent with omental implants. Reproductive: The uterus is anteverted. An intrauterine device is noted. Suboptimal visualization the ovaries. Probable bilateral ovarian follicles. Ultrasound may provide better evaluation of  the pelvic structures. Other: None Musculoskeletal:  Mild degenerative changes of the spine. No acute osseous pathology. IMPRESSION: 1. Diffuse gallbladder wall thickening with a noncalcified stone in the neck of the gallbladder. Findings may be chronic and related to underlying systemic process and ascites. Ultrasound may provide better evaluation of the gallbladder if there is clinical concern for acute cholecystitis. 2. Fatty liver. 3. Small ascites similar to prior CT. 4. Omental implants similar to prior CT. 5. No bowel obstruction. Normal appendix. 6. A 5 mm nonobstructing right renal upper pole calculus. No hydronephrosis. 7. No interval change in the retroperitoneal adenopathy. Electronically Signed   By: Anner Crete M.D.   On: 11/07/2019 17:30    Medications: I have reviewed the patient's current medications.  Assessment/Plan: 1.Adenocarcinoma of the duodenum  CT abdomen/pelvis 06/16/2018-changes of pancreatitis, fullness at the head of the pancreas without a defined mass, multiple subcentimeter peripancreatic lymph nodes, distal common bile duct stent, multiple nodules at the lung bases  EUS 06/14/2018-ulcerated mucosa/mass at the distal duodenal bulb with extension to the pancreas head, portal adenopathy, biopsy of duodenal mass-adenocarcinoma;foundation 1-microsatellite stable, tumor mutational burden 1, KRASG12D mutation  ERCP 06/14/2018-distal duodenal bulb mass, 1 cm proximal to the ampulla causing biliary obstruction, status post placement of a metal common bile duct stent  CT chest 06/20/2018-bilateral pulmonary nodules consistent with metastatic disease, supraclavicular, mediastinal, and hilar lymphadenopathy, changes of pancreatitis  Ultrasound-guided biopsy of a left supraclavicular lymph node 06/22/2018-metastatic adenocarcinoma  Cycle 1 FOLFOX 07/03/2018  Cycle 2 FOLFOX 07/17/2018  Cycle 3 FOLFOX 08/06/2018, Neulasta added  Cycle 4 FOLFOX 08/21/2018  CTs 09/06/2018-numerous small lung nodules generally decreased in size;  new rounded masslike subpleural opacity right upper lobe with some evidence of internal cavitation,measuring 4.0 cm. Minimal fat stranding about the pancreas and duodenum which is decreased. Discrete mass is not noted. Previously seen common bile duct stent absent. Gallbladder wall thickening with faintly calcified gallstones. No biliary ductal dilatation.  Cycle 5 FOLFOX 09/10/2018(oxaliplatin dose reduceddue to elevated liver enzymes)  Cycle 6 FOLFOX 09/24/2018(oxaliplatin further dose reduced due to elevated liver enzymes)  Cycle 7 FOLFOX 10/09/2018  Cycle 8 FOLFOX 10/22/2018  CTs 11/12/2018-continued improvement in lung metastases.  Maintenance Xeloda, daily dosing 11/25/2018  CTs 02/04/2019-decrease in bilateral lung nodules, many have resolved, no evidence of disease progression, no evidence of metastatic disease to the abdomen or pelvis  Xeloda continued  Xeloda placed on hold 02/26/2019 due to hand-foot syndrome, resumed 10/21/2020at a dose of 1000 mg twice daily  05/13/2019-mixed response of (increased size of left upper lobe nodule, decreased size of right lower lobe nodule), new "shotty "abdominal retroperitoneal lymph nodes, new tiny nodules in the left abdominal omentum  Elevated CEA 05/13/2019  CTs12/28/2020-increased size of the left upper lobe nodule, decreased right lower lobe nodule, new small retroperitoneal nodes, new subcentimeter nodules in the left omental fat  Xeloda continued  Xeloda placed on hold 06/28/2019  CTs 07/09/2019-when enlarged pulmonary nodules, new right hilar and pretracheal lymph nodes, enlargement of retroperitoneal lymph nodes, increased omental nodularity and stranding, gallbladder wall thickening-new, increase in ascites  Cycle 1 FOLFIRI 07/15/2019  Cycle 2 FOLFIRI 08/06/2019, Neulasta  Cycle 3 FOLFIRI 08/20/2019, Neulasta  CT scan 08/30/2019-omental caking along the transverse mesocolon slightly more pronounced; discrete duodenal mass  not seen; adenopathy at the level of the SMA stable and other subcentimeter retroperitoneal lymph nodes stable. Mild periappendiceal fat stranding.  Cycle 4 FOLFIRI 09/04/2019, Neulasta  Cycle 5 FOLFIRI 09/17/2019, Irinotecan dose reduced due to neutropenia; Neulasta  Cycle 6 FOLFIRI 10/01/2019, Neulasta  CTs 10/21/2019-decrease in size of and progressive cavitation within pulmonary nodules. Decreased thoracic adenopathy. Minimal improvement in abdominopelvic adenopathy/metastasis. Minimal improvement in extensive peritoneal metastasis.  Cycle 7 FOLFIRI 10/22/2019, Neulasta  CT abdomen/pelvis 11/07/2019-gallbladder wall thickening, stable omental implants, stable small volume ascites, no change in retroperitoneal adenopathy 2.Biliary obstruction secondary to the duodenal mass, status post placement of a bile duct stent 06/14/2018 3.Post ERCP pancreatitis 06/16/2018 4.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 Abdominal pain-secondary to the small bowel carcinoma versus pancreatitis  9. Rash-improved 10. Neutropenia secondary to chemotherapy 07/31/2018 11.Elevated transaminases 12.Oxaliplatin neuropathy 13.History of neutropenia secondary to chemotherapy   Disposition: Hailey Houston is currently being treated with FOLFIRI chemotherapy.  She has completed 7 cycles.  A restaging CT on 11/07/2019 revealed stable disease after a CT earlier this month revealed a decrease in the size of lung nodules, chest adenopathy, and peritoneal disease.  I reviewed the CT images.  I discussed the CT findings from yesterday with Hailey Houston.  The etiology of the increased pain over the past several days is unclear.  She is taking Dilaudid frequently.  We decided to increase the MS Contin to 60 mg every 12 hours.  Hailey Houston  will return for an office visit as scheduled on 11/12/2019.   I discussed the assessment and treatment plan with the patient. The patient was provided an opportunity to ask questions and all were answered. The patient agreed with the plan and demonstrated an understanding of the instructions.   The patient was advised to call back or seek an in-person evaluation if the symptoms worsen or if the condition fails to improve as anticipated.   Betsy Coder ANP/GNP-BC   11/08/2019 3:09 PM

## 2019-11-10 ENCOUNTER — Other Ambulatory Visit: Payer: Self-pay | Admitting: Oncology

## 2019-11-11 ENCOUNTER — Other Ambulatory Visit: Payer: Self-pay | Admitting: *Deleted

## 2019-11-11 ENCOUNTER — Other Ambulatory Visit: Payer: Self-pay | Admitting: Oncology

## 2019-11-11 ENCOUNTER — Telehealth: Payer: Self-pay | Admitting: Oncology

## 2019-11-11 DIAGNOSIS — C170 Malignant neoplasm of duodenum: Secondary | ICD-10-CM

## 2019-11-11 MED ORDER — ONDANSETRON 8 MG PO TBDP: 8.0000 mg | ORAL_TABLET | Freq: Three times a day (TID) | ORAL | 1 refills | Status: AC | PRN

## 2019-11-11 MED ORDER — ESCITALOPRAM OXALATE 5 MG PO TABS: 5.0000 mg | ORAL_TABLET | Freq: Every day | ORAL | 0 refills | Status: AC

## 2019-11-11 MED ORDER — PANTOPRAZOLE SODIUM 40 MG PO TBEC: 40.0000 mg | DELAYED_RELEASE_TABLET | Freq: Two times a day (BID) | ORAL | 0 refills | Status: AC

## 2019-11-11 NOTE — Progress Notes (Signed)
Patient requesting refills on the following: Adderol, Lexapro, Atarax, Protonix and ODT Zofran. Still had refill on Atarax and pharmacy will fill now. All other approved except Adderol--Dr. Benay Spice prefers Dr. Clovis Pu refill this one. Patient aware and agrees. Reports she is having a good day today.

## 2019-11-11 NOTE — Telephone Encounter (Signed)
Per 6/29 los no changes made to pt's schedule.

## 2019-11-12 ENCOUNTER — Inpatient Hospital Stay: Payer: 59

## 2019-11-12 ENCOUNTER — Other Ambulatory Visit: Payer: Self-pay | Admitting: *Deleted

## 2019-11-12 ENCOUNTER — Inpatient Hospital Stay (HOSPITAL_BASED_OUTPATIENT_CLINIC_OR_DEPARTMENT_OTHER): Payer: 59 | Admitting: Oncology

## 2019-11-12 ENCOUNTER — Other Ambulatory Visit: Payer: Self-pay

## 2019-11-12 VITALS — BP 133/94 | HR 96

## 2019-11-12 VITALS — BP 152/94 | HR 103 | Temp 97.8°F | Resp 18 | Ht 71.0 in | Wt 216.9 lb

## 2019-11-12 DIAGNOSIS — C170 Malignant neoplasm of duodenum: Secondary | ICD-10-CM

## 2019-11-12 DIAGNOSIS — Z5111 Encounter for antineoplastic chemotherapy: Secondary | ICD-10-CM | POA: Diagnosis not present

## 2019-11-12 DIAGNOSIS — Z95828 Presence of other vascular implants and grafts: Secondary | ICD-10-CM

## 2019-11-12 LAB — CBC WITH DIFFERENTIAL (CANCER CENTER ONLY)
Abs Immature Granulocytes: 0.03 10*3/uL (ref 0.00–0.07)
Basophils Absolute: 0.1 10*3/uL (ref 0.0–0.1)
Basophils Relative: 1 %
Eosinophils Absolute: 0.2 10*3/uL (ref 0.0–0.5)
Eosinophils Relative: 2 %
HCT: 36.4 % (ref 36.0–46.0)
Hemoglobin: 11.7 g/dL — ABNORMAL LOW (ref 12.0–15.0)
Immature Granulocytes: 0 %
Lymphocytes Relative: 14 %
Lymphs Abs: 1.3 10*3/uL (ref 0.7–4.0)
MCH: 29.1 pg (ref 26.0–34.0)
MCHC: 32.1 g/dL (ref 30.0–36.0)
MCV: 90.5 fL (ref 80.0–100.0)
Monocytes Absolute: 0.9 10*3/uL (ref 0.1–1.0)
Monocytes Relative: 10 %
Neutro Abs: 6.6 10*3/uL (ref 1.7–7.7)
Neutrophils Relative %: 73 %
Platelet Count: 347 10*3/uL (ref 150–400)
RBC: 4.02 MIL/uL (ref 3.87–5.11)
RDW: 18.4 % — ABNORMAL HIGH (ref 11.5–15.5)
WBC Count: 9 10*3/uL (ref 4.0–10.5)
nRBC: 0 % (ref 0.0–0.2)

## 2019-11-12 LAB — CMP (CANCER CENTER ONLY)
ALT: 37 U/L (ref 0–44)
AST: 28 U/L (ref 15–41)
Albumin: 3.9 g/dL (ref 3.5–5.0)
Alkaline Phosphatase: 129 U/L — ABNORMAL HIGH (ref 38–126)
Anion gap: 10 (ref 5–15)
BUN: 9 mg/dL (ref 6–20)
CO2: 28 mmol/L (ref 22–32)
Calcium: 9.5 mg/dL (ref 8.9–10.3)
Chloride: 100 mmol/L (ref 98–111)
Creatinine: 0.76 mg/dL (ref 0.44–1.00)
GFR, Est AFR Am: >60 mL/min (ref >60)
GFR, Estimated: >60 mL/min (ref >60)
Glucose, Bld: 126 mg/dL — ABNORMAL HIGH (ref 70–99)
Potassium: 3.6 mmol/L (ref 3.5–5.1)
Sodium: 138 mmol/L (ref 135–145)
Total Bilirubin: 0.4 mg/dL (ref 0.3–1.2)
Total Protein: 7.2 g/dL (ref 6.5–8.1)

## 2019-11-12 LAB — CEA (IN HOUSE-CHCC): CEA (CHCC-In House): 94.26 ng/mL — ABNORMAL HIGH (ref 0.00–5.00)

## 2019-11-12 MED ORDER — HEPARIN SOD (PORK) LOCK FLUSH 100 UNIT/ML IV SOLN
500.0000 [IU] | Freq: Once | INTRAVENOUS | Status: DC | PRN
  Filled 2019-11-12: qty 5

## 2019-11-12 MED ORDER — ATROPINE SULFATE 1 MG/ML IJ SOLN
0.5000 mg | Freq: Once | INTRAMUSCULAR | Status: AC | PRN
  Administered 2019-11-12: 0.5 mg via INTRAVENOUS

## 2019-11-12 MED ORDER — PALONOSETRON HCL INJECTION 0.25 MG/5ML
0.2500 mg | Freq: Once | INTRAVENOUS | Status: AC
  Administered 2019-11-12: 0.25 mg via INTRAVENOUS

## 2019-11-12 MED ORDER — SODIUM CHLORIDE 0.9 % IV SOLN
Freq: Once | INTRAVENOUS | Status: AC
  Filled 2019-11-12: qty 250

## 2019-11-12 MED ORDER — SODIUM CHLORIDE 0.9 % IV SOLN
150.0000 mg | Freq: Once | INTRAVENOUS | Status: AC
  Administered 2019-11-12: 150 mg via INTRAVENOUS
  Filled 2019-11-12: qty 150

## 2019-11-12 MED ORDER — SODIUM CHLORIDE 0.9 % IV SOLN
2000.0000 mg/m2 | INTRAVENOUS | Status: DC
  Administered 2019-11-12: 4400 mg via INTRAVENOUS
  Filled 2019-11-12: qty 88

## 2019-11-12 MED ORDER — FLUOROURACIL CHEMO INJECTION 2.5 GM/50ML
300.0000 mg/m2 | Freq: Once | INTRAVENOUS | Status: AC
  Administered 2019-11-12: 650 mg via INTRAVENOUS
  Filled 2019-11-12: qty 13

## 2019-11-12 MED ORDER — SODIUM CHLORIDE 0.9 % IV SOLN
140.0000 mg/m2 | Freq: Once | INTRAVENOUS | Status: AC
  Administered 2019-11-12: 300 mg via INTRAVENOUS
  Filled 2019-11-12: qty 15

## 2019-11-12 MED ORDER — SODIUM CHLORIDE 0.9% FLUSH
10.0000 mL | Freq: Once | INTRAVENOUS | Status: AC
  Administered 2019-11-12: 10 mL
  Filled 2019-11-12: qty 10

## 2019-11-12 MED ORDER — PALONOSETRON HCL INJECTION 0.25 MG/5ML
INTRAVENOUS | Status: AC
  Filled 2019-11-12: qty 5

## 2019-11-12 MED ORDER — DEXAMETHASONE 4 MG PO TABS: 4.0000 mg | ORAL_TABLET | Freq: Two times a day (BID) | ORAL | 1 refills | Status: AC

## 2019-11-12 MED ORDER — SODIUM CHLORIDE 0.9 % IV SOLN
10.0000 mg | Freq: Once | INTRAVENOUS | Status: AC
  Administered 2019-11-12: 10 mg via INTRAVENOUS
  Filled 2019-11-12: qty 10

## 2019-11-12 MED ORDER — SODIUM CHLORIDE 0.9% FLUSH
10.0000 mL | INTRAVENOUS | Status: DC | PRN
  Administered 2019-11-12: 10 mL
  Filled 2019-11-12: qty 10

## 2019-11-12 MED ORDER — SODIUM CHLORIDE 0.9 % IV SOLN
300.0000 mg/m2 | Freq: Once | INTRAVENOUS | Status: AC
  Administered 2019-11-12: 664 mg via INTRAVENOUS
  Filled 2019-11-12: qty 33.2

## 2019-11-12 MED ORDER — ATROPINE SULFATE 1 MG/ML IJ SOLN
INTRAMUSCULAR | Status: AC
  Filled 2019-11-12: qty 1

## 2019-11-12 NOTE — Progress Notes (Signed)
Per Dr. Benay Spice: OK to treat w/pulse 103 and elevated BP. Please recheck v/s when patient gets settled.

## 2019-11-12 NOTE — Patient Instructions (Signed)
[facility] Discharge Instructions for Patients Receiving Chemotherapy  Today you received the following chemotherapy agents Irinotecan (CAMPTOSAR), Leucovorin & Flourouracil (ADRUCIL).  To help prevent nausea and vomiting after your treatment, we encourage you to take your nausea medication as prescribed.    If you develop nausea and vomiting that is not controlled by your nausea medication, call the clinic.   BELOW ARE SYMPTOMS THAT SHOULD BE REPORTED IMMEDIATELY:  *FEVER GREATER THAN 100.5 F  *CHILLS WITH OR WITHOUT FEVER  NAUSEA AND VOMITING THAT IS NOT CONTROLLED WITH YOUR NAUSEA MEDICATION  *UNUSUAL SHORTNESS OF BREATH  *UNUSUAL BRUISING OR BLEEDING  TENDERNESS IN MOUTH AND THROAT WITH OR WITHOUT PRESENCE OF ULCERS  *URINARY PROBLEMS  *BOWEL PROBLEMS  UNUSUAL RASH Items with * indicate a potential emergency and should be followed up as soon as possible.  Feel free to call the clinic should you have any questions or concerns. The clinic phone number is [phone].  Please show the CHEMO ALERT CARD at check-in to the Emergency Department and triage nurse.   

## 2019-11-12 NOTE — Progress Notes (Signed)
Erroneous encounter

## 2019-11-12 NOTE — Progress Notes (Signed)
[facility] OFFICE PROGRESS NOTE   Diagnosis: Small bowel carcinoma  INTERVAL HISTORY:   Hailey Houston returns as scheduled.  She continues to have lower abdominal pain.  She takes Dilaudid (8-12 mg) every 4 hours during the day.  The MS Contin was increased to 60 mg twice daily on 11/08/2019.  She is having bowel movements.  She reports difficulty starting a urine stream.  Good appetite.  She is ambulatory in the home, but stays on the couch most of the day.  She rarely takes breakthrough medication during the night.  She has intermittent nausea.  She relates nausea to taking pain medication.  Objective:  Vital signs in last 24 hours:  Blood pressure (!) 171/105, pulse (!) 103, temperature 97.8 F (36.6 C), resp. rate 18, height 5' 11"  (1.803 m), weight 216 lb 14.4 oz (98.4 kg), SpO2 93 %.    Resp: Lungs clear bilaterally Cardio: Regular rate and rhythm GI: Soft, no hepatosplenomegaly, no mass, nontender Vascular: No leg edema    Portacath/PICC-without erythema  Lab Results:  Lab Results  Component Value Date   WBC 9.0 11/12/2019   HGB 11.7 (L) 11/12/2019   HCT 36.4 11/12/2019   MCV 90.5 11/12/2019   PLT 347 11/12/2019   NEUTROABS 6.6 11/12/2019    CMP  Lab Results  Component Value Date   NA 138 11/12/2019   K 3.6 11/12/2019   CL 100 11/12/2019   CO2 28 11/12/2019   GLUCOSE 126 (H) 11/12/2019   BUN 9 11/12/2019   CREATININE 0.76 11/12/2019   CALCIUM 9.5 11/12/2019   PROT 7.2 11/12/2019   ALBUMIN 3.9 11/12/2019   AST 28 11/12/2019   ALT 37 11/12/2019   ALKPHOS 129 (H) 11/12/2019   BILITOT 0.4 11/12/2019   GFRNONAA >60 11/12/2019   GFRAA >60 11/12/2019    Lab Results  Component Value Date   CEA1 94.26 (H) 11/12/2019    Medications: I have reviewed the patient's current medications.   Assessment/Plan: 1. Adenocarcinoma of the duodenum  CT abdomen/pelvis 06/16/2018-changes of pancreatitis, fullness at the head of the pancreas without a  defined mass, multiple subcentimeter peripancreatic lymph nodes, distal common bile duct stent, multiple nodules at the lung bases  EUS 06/14/2018-ulcerated mucosa/mass at the distal duodenal bulb with extension to the pancreas head, portal adenopathy, biopsy of duodenal mass-adenocarcinoma;foundation 1-microsatellite stable, tumor mutational burden 1, KRASG12D mutation  ERCP 06/14/2018-distal duodenal bulb mass, 1 cm proximal to the ampulla causing biliary obstruction, status post placement of a metal common bile duct stent  CT chest 06/20/2018-bilateral pulmonary nodules consistent with metastatic disease, supraclavicular, mediastinal, and hilar lymphadenopathy, changes of pancreatitis  Ultrasound-guided biopsy of a left supraclavicular lymph node 06/22/2018-metastatic adenocarcinoma  Cycle 1 FOLFOX 07/03/2018  Cycle 2 FOLFOX 07/17/2018  Cycle 3 FOLFOX 08/06/2018, Neulasta added  Cycle 4 FOLFOX 08/21/2018  CTs 09/06/2018-numerous small lung nodules generally decreased in size; new rounded masslike subpleural opacity right upper lobe with some evidence of internal cavitation,measuring 4.0 cm. Minimal fat stranding about the pancreas and duodenum which is decreased. Discrete mass is not noted. Previously seen common bile duct stent absent. Gallbladder wall thickening with faintly calcified gallstones. No biliary ductal dilatation.  Cycle 5 FOLFOX 09/10/2018(oxaliplatin dose reduceddue to elevated liver enzymes)  Cycle 6 FOLFOX 09/24/2018(oxaliplatin further dose reduced due to elevated liver enzymes)  Cycle 7 FOLFOX 10/09/2018  Cycle 8 FOLFOX 10/22/2018  CTs 11/12/2018-continued improvement in lung metastases.  Maintenance Xeloda, daily dosing 11/25/2018  CTs 02/04/2019-decrease in bilateral lung nodules,  many have resolved, no evidence of disease progression, no evidence of metastatic disease to the abdomen or pelvis  Xeloda continued  Xeloda placed on hold 02/26/2019 due to  hand-foot syndrome, resumed 10/21/2020at a dose of 1000 mg twice daily  05/13/2019-mixed response of (increased size of left upper lobe nodule, decreased size of right lower lobe nodule), new "shotty "abdominal retroperitoneal lymph nodes, new tiny nodules in the left abdominal omentum  Elevated CEA 05/13/2019  CTs12/28/2020-increased size of the left upper lobe nodule, decreased right lower lobe nodule, new small retroperitoneal nodes, new subcentimeter nodules in the left omental fat  Xeloda continued  Xeloda placed on hold 06/28/2019  CTs 07/09/2019-when enlarged pulmonary nodules, new right hilar and pretracheal lymph nodes, enlargement of retroperitoneal lymph nodes, increased omental nodularity and stranding, gallbladder wall thickening-new, increase in ascites  Cycle 1 FOLFIRI 07/15/2019  Cycle 2 FOLFIRI 08/06/2019, Neulasta  Cycle 3 FOLFIRI 08/20/2019, Neulasta  CT scan 08/30/2019-omental caking along the transverse mesocolon slightly more pronounced; discrete duodenal mass not seen; adenopathy at the level of the SMA stable and other subcentimeter retroperitoneal lymph nodes stable. Mild periappendiceal fat stranding.  Cycle 4 FOLFIRI 09/04/2019, Neulasta  Cycle 5 FOLFIRI 09/17/2019, Irinotecan dose reduced due to neutropenia; Neulasta  Cycle 6 FOLFIRI 10/01/2019, Neulasta  CTs 10/21/2019-decrease in size of and progressive cavitation within pulmonary nodules. Decreased thoracic adenopathy. Minimal improvement in abdominopelvic adenopathy/metastasis. Minimal improvement in extensive peritoneal metastasis.  Cycle 7 FOLFIRI 10/22/2019, Neulasta  CT abdomen/pelvis 11/07/2019-gallbladder wall thickening, stable omental implants, stable small volume ascites, no change in retroperitoneal adenopathy  Cycle 8 FOLFIRI 11/12/2019, Neulasta 2.Biliary obstruction secondary to the duodenal mass, status post placement of a bile duct stent 06/14/2018 3.Post ERCP pancreatitis 06/16/2018 4.Anemia  secondary to phlebotomy and multiple procedures 5.G2, P2 6.Nausea secondary to #1  Upper GI 06/27/2018-moderate proximal duodenal narrowing, no evidence of gastric outlet obstruction 7.Attention deficit disorder 8. Abdominal pain-secondary to the small bowel carcinoma versus pancreatitis  9. Rash-improved 10. Neutropenia secondary to chemotherapy 07/31/2018 11.Elevated transaminases 12.Oxaliplatin neuropathy 13.History of neutropenia secondary to chemotherapy    Disposition: Hailey Houston continues to have severe abdominal pain.  The pain is most likely related to carcinomatosis.  The CT on 11/07/2019 revealed no evidence of disease progression.  She will continue MS Contin and Dilaudid for pain.  The MS Contin will be increased to 120 mg in the morning and 60 mg in the evening.  She will call if this does not help the pain.  Hailey Houston will return for an office visit and the next cycle of chemotherapy in 3 weeks.  I will review the CT images with radiology.  Betsy Coder, MD  11/12/2019  10:23 AM

## 2019-11-13 ENCOUNTER — Telehealth: Payer: Self-pay | Admitting: Oncology

## 2019-11-13 NOTE — Telephone Encounter (Signed)
Scheduled appts per 6/29 los. Left voicemail with appt date and times.

## 2019-11-14 ENCOUNTER — Inpatient Hospital Stay: Payer: 59 | Attending: Oncology

## 2019-11-14 ENCOUNTER — Telehealth: Payer: Self-pay | Admitting: Nurse Practitioner

## 2019-11-14 ENCOUNTER — Telehealth: Payer: Self-pay | Admitting: *Deleted

## 2019-11-14 ENCOUNTER — Other Ambulatory Visit: Payer: Self-pay

## 2019-11-14 DIAGNOSIS — Z452 Encounter for adjustment and management of vascular access device: Secondary | ICD-10-CM | POA: Insufficient documentation

## 2019-11-14 DIAGNOSIS — Z5111 Encounter for antineoplastic chemotherapy: Secondary | ICD-10-CM | POA: Diagnosis present

## 2019-11-14 DIAGNOSIS — G620 Drug-induced polyneuropathy: Secondary | ICD-10-CM | POA: Diagnosis not present

## 2019-11-14 DIAGNOSIS — C7800 Secondary malignant neoplasm of unspecified lung: Secondary | ICD-10-CM | POA: Diagnosis not present

## 2019-11-14 DIAGNOSIS — E860 Dehydration: Secondary | ICD-10-CM | POA: Insufficient documentation

## 2019-11-14 DIAGNOSIS — D701 Agranulocytosis secondary to cancer chemotherapy: Secondary | ICD-10-CM | POA: Diagnosis not present

## 2019-11-14 DIAGNOSIS — C170 Malignant neoplasm of duodenum: Secondary | ICD-10-CM | POA: Diagnosis present

## 2019-11-14 DIAGNOSIS — C786 Secondary malignant neoplasm of retroperitoneum and peritoneum: Secondary | ICD-10-CM | POA: Insufficient documentation

## 2019-11-14 MED ORDER — HEPARIN SOD (PORK) LOCK FLUSH 100 UNIT/ML IV SOLN
250.0000 [IU] | Freq: Once | INTRAVENOUS | Status: AC | PRN
  Administered 2019-11-14: 250 [IU]
  Filled 2019-11-14: qty 5

## 2019-11-14 MED ORDER — PEGFILGRASTIM INJECTION 6 MG/0.6ML ~~LOC~~
6.0000 mg | PREFILLED_SYRINGE | Freq: Once | SUBCUTANEOUS | Status: AC
  Administered 2019-11-14: 6 mg via SUBCUTANEOUS

## 2019-11-14 MED ORDER — PEGFILGRASTIM INJECTION 6 MG/0.6ML ~~LOC~~
PREFILLED_SYRINGE | SUBCUTANEOUS | Status: AC
  Filled 2019-11-14: qty 0.6

## 2019-11-14 MED ORDER — SODIUM CHLORIDE 0.9% FLUSH
10.0000 mL | INTRAVENOUS | Status: DC | PRN
  Administered 2019-11-14: 10 mL
  Filled 2019-11-14: qty 10

## 2019-11-14 NOTE — Telephone Encounter (Signed)
Returned call to Hailey Houston regarding message from earlier today.  Dr. Benay Spice reviewed the CT images with the radiologist.  No further evaluation of the ovaries recommended.  Findings stable as compared to previous CT.

## 2019-11-14 NOTE — Patient Instructions (Signed)

## 2019-11-14 NOTE — Telephone Encounter (Signed)
Received vm call from pt asking for update on CT results regarding ovaries & Dr Benay Spice was to review.  Message to Dr Benay Spice.

## 2019-11-18 ENCOUNTER — Encounter (HOSPITAL_COMMUNITY): Payer: Self-pay

## 2019-11-18 ENCOUNTER — Other Ambulatory Visit: Payer: Self-pay

## 2019-11-18 ENCOUNTER — Emergency Department (HOSPITAL_COMMUNITY): Payer: 59

## 2019-11-18 ENCOUNTER — Emergency Department (HOSPITAL_COMMUNITY)
Admission: EM | Admit: 2019-11-18 | Discharge: 2019-11-19 | Disposition: A | Discharge status: Home / Self Care | Payer: 59 | Attending: Emergency Medicine | Admitting: Emergency Medicine

## 2019-11-18 DIAGNOSIS — R1030 Lower abdominal pain, unspecified: Secondary | ICD-10-CM | POA: Diagnosis not present

## 2019-11-18 DIAGNOSIS — C170 Malignant neoplasm of duodenum: Secondary | ICD-10-CM | POA: Insufficient documentation

## 2019-11-18 DIAGNOSIS — F17200 Nicotine dependence, unspecified, uncomplicated: Secondary | ICD-10-CM | POA: Insufficient documentation

## 2019-11-18 LAB — CBC WITH DIFFERENTIAL/PLATELET
Abs Immature Granulocytes: 0.65 10*3/uL — ABNORMAL HIGH (ref 0.00–0.07)
Basophils Absolute: 0.1 10*3/uL (ref 0.0–0.1)
Basophils Relative: 1 %
Eosinophils Absolute: 0 10*3/uL (ref 0.0–0.5)
Eosinophils Relative: 0 %
HCT: 36.1 % (ref 36.0–46.0)
Hemoglobin: 11.8 g/dL — ABNORMAL LOW (ref 12.0–15.0)
Immature Granulocytes: 3 %
Lymphocytes Relative: 12 %
Lymphs Abs: 2.8 10*3/uL (ref 0.7–4.0)
MCH: 29.1 pg (ref 26.0–34.0)
MCHC: 32.7 g/dL (ref 30.0–36.0)
MCV: 88.9 fL (ref 80.0–100.0)
Monocytes Absolute: 1.2 10*3/uL — ABNORMAL HIGH (ref 0.1–1.0)
Monocytes Relative: 5 %
Neutro Abs: 18.4 10*3/uL — ABNORMAL HIGH (ref 1.7–7.7)
Neutrophils Relative %: 79 %
Platelets: 318 10*3/uL (ref 150–400)
RBC: 4.06 MIL/uL (ref 3.87–5.11)
RDW: 17.9 % — ABNORMAL HIGH (ref 11.5–15.5)
WBC: 23.2 10*3/uL — ABNORMAL HIGH (ref 4.0–10.5)
nRBC: 0.1 % (ref 0.0–0.2)

## 2019-11-18 LAB — I-STAT BETA HCG BLOOD, ED (MC, WL, AP ONLY): I-stat hCG, quantitative: <5 m[IU]/mL (ref <5)

## 2019-11-18 LAB — PROTIME-INR
INR: 1 (ref 0.8–1.2)
Prothrombin Time: 12.8 seconds (ref 11.4–15.2)

## 2019-11-18 LAB — COMPREHENSIVE METABOLIC PANEL
ALT: 27 U/L (ref 0–44)
AST: 19 U/L (ref 15–41)
Albumin: 4.4 g/dL (ref 3.5–5.0)
Alkaline Phosphatase: 162 U/L — ABNORMAL HIGH (ref 38–126)
Anion gap: 14 (ref 5–15)
BUN: 11 mg/dL (ref 6–20)
CO2: 25 mmol/L (ref 22–32)
Calcium: 9.7 mg/dL (ref 8.9–10.3)
Chloride: 97 mmol/L — ABNORMAL LOW (ref 98–111)
Creatinine, Ser: 0.55 mg/dL (ref 0.44–1.00)
GFR calc Af Amer: >60 mL/min (ref >60)
GFR calc non Af Amer: >60 mL/min (ref >60)
Glucose, Bld: 108 mg/dL — ABNORMAL HIGH (ref 70–99)
Potassium: 3.2 mmol/L — ABNORMAL LOW (ref 3.5–5.1)
Sodium: 136 mmol/L (ref 135–145)
Total Bilirubin: 0.8 mg/dL (ref 0.3–1.2)
Total Protein: 7.7 g/dL (ref 6.5–8.1)

## 2019-11-18 LAB — URINALYSIS, ROUTINE W REFLEX MICROSCOPIC
Bacteria, UA: NONE SEEN
Bilirubin Urine: NEGATIVE
Glucose, UA: NEGATIVE mg/dL
Ketones, ur: NEGATIVE mg/dL
Nitrite: NEGATIVE
Protein, ur: NEGATIVE mg/dL
Specific Gravity, Urine: 1.012 (ref 1.005–1.030)
pH: 6 (ref 5.0–8.0)

## 2019-11-18 LAB — APTT: aPTT: 28 seconds (ref 24–36)

## 2019-11-18 LAB — LACTIC ACID, PLASMA: Lactic Acid, Venous: 1.2 mmol/L (ref 0.5–1.9)

## 2019-11-18 MED ORDER — ONDANSETRON HCL 4 MG/2ML IJ SOLN
4.0000 mg | Freq: Once | INTRAMUSCULAR | Status: AC
  Administered 2019-11-18: 4 mg via INTRAVENOUS
  Filled 2019-11-18: qty 2

## 2019-11-18 MED ORDER — SODIUM CHLORIDE (PF) 0.9 % IJ SOLN
INTRAMUSCULAR | Status: AC
  Filled 2019-11-18: qty 50

## 2019-11-18 MED ORDER — FENTANYL CITRATE (PF) 100 MCG/2ML IJ SOLN
50.0000 ug | Freq: Once | INTRAMUSCULAR | Status: AC
  Administered 2019-11-19: 50 ug via INTRAVENOUS
  Filled 2019-11-18: qty 2

## 2019-11-18 MED ORDER — METOCLOPRAMIDE HCL 5 MG/ML IJ SOLN
5.0000 mg | Freq: Once | INTRAMUSCULAR | Status: AC
  Administered 2019-11-19: 5 mg via INTRAVENOUS
  Filled 2019-11-18: qty 2

## 2019-11-18 MED ORDER — IOHEXOL 300 MG/ML  SOLN
100.0000 mL | Freq: Once | INTRAMUSCULAR | Status: AC | PRN
  Administered 2019-11-18: 100 mL via INTRAVENOUS

## 2019-11-18 MED ORDER — DICYCLOMINE HCL 20 MG PO TABS: 20.0000 mg | ORAL_TABLET | Freq: Two times a day (BID) | ORAL | 0 refills | Status: DC

## 2019-11-18 MED ORDER — SODIUM CHLORIDE 0.9 % IV SOLN
1000.0000 mL | INTRAVENOUS | Status: DC
  Administered 2019-11-18: 1000 mL via INTRAVENOUS

## 2019-11-18 MED ORDER — MECLIZINE HCL 25 MG PO TABS
25.0000 mg | ORAL_TABLET | Freq: Three times a day (TID) | ORAL | 0 refills | Status: DC | PRN
Start: 2019-11-18 — End: 2019-12-03

## 2019-11-18 MED ORDER — HYDROMORPHONE HCL 1 MG/ML IJ SOLN
0.5000 mg | Freq: Once | INTRAMUSCULAR | Status: AC
  Administered 2019-11-18: 0.5 mg via INTRAVENOUS
  Filled 2019-11-18: qty 1

## 2019-11-18 NOTE — ED Provider Notes (Signed)
Suprapubic AP, H/O colon CA High WBC - just got neulasta Wanting pain meds Head CT pending  Home with Bentyl and meclizine At discharge give 1 mg Dilaudid  1:00 - Re-eval after Reglan and Fentanyl and patient is no better. Will give Dilaudid and Zofran, which she states has worked for her in the past. She tells me her pain is not what she would associate with her CA.   Reviewed her abdominal CT, labs. Will continue pain management with goal of discharge home. BP reported as 156/129 - will obtain manual and recheck.   3:00 - she feels much better. Walks to bathroom. Per mom at bedside she looks much better than earlier. She can be discharged home per plan of previous treatment team.    Charlann Lange, PA-C 11/19/19 [id number]    Charlesetta Shanks, MD 11/19/19 [phone]

## 2019-11-18 NOTE — ED Provider Notes (Signed)
[facility] DEPT Provider Note   CSN: [id number] Arrival date & time: 11/18/19  1817     History Chief Complaint  Patient presents with  . Abdominal Pain    Hailey Houston is a 49 y.o. female.  With adeno carcinoma of the duodenum who presents emergency department with chief complaint of abdominal pain.  Patient states that her pain began this morning.  She has associated dysuria and urinary frequency.  She complains of lower abdominal pain which is worse with any movement or coughing.  She states she has never had pain like this before.  She denies a history of abdominal surgeries.  She has had nausea without vomiting.  She had a couple episodes of loose stool.  She denies fever or chills.  She denies flank pain.  She rates her pain at 9 out of 10  HPI     Past Medical History:  Diagnosis Date  . ADD (attention deficit disorder)   . Adenocarcinoma of duodenum ([place]) 06/16/2018  . Anxiety   . Family history of adverse reaction to anesthesia    mom goes into afib  . Wears glasses    driving    Patient Active Problem List   Diagnosis Date Noted  . Port-A-Cath in place 07/09/2018  . Goals of care, counseling/discussion 06/26/2018  . Epigastric pain   . Malnutrition of moderate degree 06/22/2018  . Adenocarcinoma of duodenum ([place]) 06/16/2018  . Pancreatitis 06/16/2018  . Hypokalemia 06/16/2018  . Dehydration 06/16/2018  . Nausea and vomiting 06/16/2018  . Acute pancreatitis 06/16/2018  . Pulmonary nodules 06/16/2018  . Abnormal CT scan   . Common bile duct (CBD) stricture   . Dilation of biliary tract   . Duodenal mass   . Attention deficit hyperactivity disorder (ADHD) 05/27/2018  . Tibia fracture 08/29/2013    Past Surgical History:  Procedure Laterality Date  . BILIARY STENT PLACEMENT N/A 06/14/2018   Procedure: BILIARY STENT PLACEMENT;  Surgeon: Milus Banister, MD;  Location: [facility];  Service: Endoscopy;  Laterality: N/A;  .  BIOPSY  06/14/2018   Procedure: BIOPSY;  Surgeon: [facility] Banister, MD;  [facility]: [facility];  Service: Endoscopy;;  . ENDOSCOPIC RETROGRADE CHOLANGIOPANCREATOGRAPHY (ERCP) WITH PROPOFOL N/A 06/14/2018   Procedure: ENDOSCOPIC RETROGRADE CHOLANGIOPANCREATOGRAPHY (ERCP) WITH PROPOFOL;  Surgeon: [facility] Banister, MD;  [facility];  Service: Endoscopy;  Laterality: N/A;  . ESOPHAGOGASTRODUODENOSCOPY N/A 06/14/2018   Procedure: ESOPHAGOGASTRODUODENOSCOPY (EGD);  Surgeon: [facility] Banister, MD;  Location: [facility];  Service: Endoscopy;  Laterality: N/A;  . EUS N/A 06/14/2018   Procedure: UPPER ENDOSCOPIC ULTRASOUND (EUS) RADIAL;  Surgeon: [facility] Banister, MD;  [facility];  Service: Endoscopy;  Laterality: N/A;  . IR IMAGING GUIDED PORT INSERTION  07/06/2018  . ORIF ANKLE FRACTURE Left 08/29/2013   Procedure: LEFT FIBULA -FRACTURE OPEN TREATMENT DISTAL FIBULAR (LATERALERAL MALLEOLUS) INCLUDES INTERNAL FIXATION, ;  Surgeon: [name], MD;  Location: [facility];  Service: Orthopedics;  Laterality: Left;  . PERINEAL LACERATION REPAIR     post childbirth  . SPHINCTEROTOMY  06/14/2018   Procedure: SPHINCTEROTOMY;  Surgeon: [facility] Banister, MD;  Location: [facility];  Service: Endoscopy;;  . TIBIA IM NAIL INSERTION Left 08/29/2013   Procedure: FRACTURE TREATMENT TIBIAL SHAFT BY INTRAMEDULLARY IMPLANT WITH SCREWS ;  Surgeon: [name], MD;  Location: [facility];  Service: Orthopedics;  Laterality: Left;     OB History   No obstetric history on  file.     Family History  Problem Relation Age of Onset  . Heart disease Mother   . Heart disease Father     Social History   Tobacco Use  . Smoking status: Current Some Day Smoker  . Smokeless tobacco: Never Used  Substance Use Topics  . Alcohol use: Yes    Comment: occ  . Drug use: No    Home Medications Prior to Admission medications   Medication Sig Start Date End Date Taking?  Authorizing Provider  amphetamine-dextroamphetamine (ADDERALL) 30 MG tablet Take 1 tablet by mouth 2 (two) times daily. 07/01/19  Yes Cottle, Billey Co., MD  cetirizine (ZYRTEC) 10 MG tablet Take 10 mg by mouth daily as needed for allergies.   Yes [provider]  dexamethasone (DECADRON) 4 MG tablet Take 1 tablet (4 mg total) by mouth 2 (two) times daily. Take for 3 days after each chemotherapy starting on day 2 11/12/19  Yes Ladell Pier, MD  docusate sodium (COLACE) 100 MG capsule Take 2 capsules (200 mg total) by mouth 2 (two) times daily as needed for mild constipation. 07/17/19  Yes Ladell Pier, MD  escitalopram (LEXAPRO) 5 MG tablet Take 1 tablet (5 mg total) by mouth daily. 11/11/19  Yes Ladell Pier, MD  famotidine ([employer] ACID CONTROLLER MAX ST) 20 MG tablet TAKE 1 TABLET BY MOUTH TWICE A DAY *INS PREFERS OTC* Patient taking differently: Take 20 mg by mouth 2 (two) times daily.  11/01/19  Yes Ladell Pier, MD  HYDROmorphone (DILAUDID) 4 MG tablet Take 2-3 tablets (8-12 mg total) by mouth every 6 (six) hours as needed for severe pain. Patient taking differently: Take 8 mg by mouth every 6 (six) hours as needed for moderate pain or severe pain.  11/05/19  Yes Ladell Pier, MD  hydrOXYzine (ATARAX/VISTARIL) 25 MG tablet Take 1 tablet (25 mg total) by mouth every 8 (eight) hours as needed. Patient taking differently: Take 25 mg by mouth every 8 (eight) hours as needed for anxiety or itching.  10/22/19  Yes Owens Shark, NP  KLOR-CON M20 20 MEQ tablet TAKE 1 TABLET BY MOUTH EVERY DAY 11/11/19  Yes Ladell Pier, MD  LORazepam (ATIVAN) 0.5 MG tablet TAKE 1 TABLET EVERY 6 HOURS AS NEEDED FOR ANXIETY OR NAUSEA Patient taking differently: Take 0.5 mg by mouth every 6 (six) hours as needed for anxiety (nausea). TAKE 1 TABLET EVERY 6 HOURS AS NEEDED FOR ANXIETY OR NAUSEA 08/20/19  Yes Ladell Pier, MD  morphine (MS CONTIN) 60 MG 12 hr tablet Take 1 tablet (60 mg total) by mouth  every 12 (twelve) hours. 11/08/19  Yes Ladell Pier, MD  ondansetron (ZOFRAN ODT) 8 MG disintegrating tablet Take 1 tablet (8 mg total) by mouth every 8 (eight) hours as needed for nausea or vomiting. 11/11/19  Yes Ladell Pier, MD  pantoprazole (PROTONIX) 40 MG tablet Take 1 tablet (40 mg total) by mouth 2 (two) times daily. 11/11/19  Yes Ladell Pier, MD  Polyethylene Glycol 3350 (MIRALAX PO) Take 17 g by mouth daily as needed (constipation).  07/03/18  Yes [provider]  zaleplon (SONATA) 10 MG capsule Take 1 capsule (10 mg total) by mouth at bedtime as needed for sleep. 07/01/19  Yes Cottle, Billey Co., MD  dicyclomine (BENTYL) 20 MG tablet Take 1 tablet (20 mg total) by mouth 2 (two) times daily. 11/18/19   Margarita Mail, PA-C  meclizine (ANTIVERT) 25 MG  tablet Take 1 tablet (25 mg total) by mouth 3 (three) times daily as needed for dizziness. 11/18/19   Rigby Swamy, PA-C  prochlorperazine (COMPAZINE) 10 MG tablet TAKE 1 TABLET BY MOUTH EVERY 6 HOURS AS NEEDED FOR NAUSEA AND VOMITING Patient not taking: Reported on 11/12/2019 10/25/19   Ladell Pier, MD    Allergies    Oxycodone  Review of Systems   Review of Systems Ten systems reviewed and are negative for acute change, except as noted in the HPI.   Physical Exam Updated Vital Signs BP (!) 160/98   Pulse 94   Temp (!) 97.3 F (36.3 C)   Resp 16   Ht 5' 10" (1.778 m)   Wt 98.4 kg   SpO2 99%   BMI 31.14 kg/m   Physical Exam Vitals and nursing note reviewed.  Constitutional:      General: She is not in acute distress.    Appearance: She is well-developed. She is not diaphoretic.  HENT:     Head: Normocephalic and atraumatic.  Eyes:     General: No scleral icterus.    Conjunctiva/sclera: Conjunctivae normal.  Cardiovascular:     Rate and Rhythm: Normal rate and regular rhythm.     Heart sounds: Normal heart sounds. No murmur heard.  No friction rub. No gallop.   Pulmonary:     Effort: Pulmonary  effort is normal. No respiratory distress.     Breath sounds: Normal breath sounds.  Abdominal:     General: Bowel sounds are normal. There is no distension.     Palpations: Abdomen is soft. There is no mass.     Tenderness: There is abdominal tenderness in the right lower quadrant, suprapubic area and left lower quadrant. There is no guarding.     Hernia: No hernia is present.  Musculoskeletal:     Cervical back: Normal range of motion.  Skin:    General: Skin is warm and dry.  Neurological:     Mental Status: She is alert and oriented to person, place, and time.  Psychiatric:        Behavior: Behavior normal.     ED Results / Procedures / Treatments   Labs (all labs ordered are listed, but only abnormal results are displayed) Labs Reviewed  COMPREHENSIVE METABOLIC PANEL - Abnormal; Notable for the following components:      Result Value   Potassium 3.2 (*)    Chloride 97 (*)    Glucose, Bld 108 (*)    Alkaline Phosphatase 162 (*)    All other components within normal limits  CBC WITH DIFFERENTIAL/PLATELET - Abnormal; Notable for the following components:   WBC 23.2 (*)    Hemoglobin 11.8 (*)    RDW 17.9 (*)    Neutro Abs 18.4 (*)    Monocytes Absolute 1.2 (*)    Abs Immature Granulocytes 0.65 (*)    All other components within normal limits  URINALYSIS, ROUTINE W REFLEX MICROSCOPIC - Abnormal; Notable for the following components:   Hgb urine dipstick MODERATE (*)    Leukocytes,Ua TRACE (*)    All other components within normal limits  CULTURE, BLOOD (ROUTINE X 2)  CULTURE, BLOOD (ROUTINE X 2)  URINE CULTURE  LACTIC ACID, PLASMA  APTT  PROTIME-INR  I-STAT BETA HCG BLOOD, ED ([facility], [facility], [facility] ONLY)    EKG None  Radiology CT Head Wo Contrast  Result Date: 11/19/2019 CLINICAL DATA:  Status post fall. EXAM: CT HEAD WITHOUT CONTRAST TECHNIQUE: Contiguous axial images  were obtained from the base of the skull through the vertex without intravenous contrast. COMPARISON:   None. FINDINGS: Brain: No evidence of acute infarction, hemorrhage, hydrocephalus, extra-axial collection or mass lesion/mass effect. Vascular: No hyperdense vessel or unexpected calcification. Skull: Normal. Negative for fracture or focal lesion. Sinuses/Orbits: No acute finding. Other: None. IMPRESSION: No acute intracranial pathology. Electronically Signed   By: Virgina Norfolk M.D.   On: 11/19/2019 00:00   CT ABDOMEN PELVIS W CONTRAST  Result Date: 11/18/2019 CLINICAL DATA:  Abdominal pain and dysuria, history of small-bowel cancer EXAM: CT ABDOMEN AND PELVIS WITH CONTRAST TECHNIQUE: Multidetector CT imaging of the abdomen and pelvis was performed using the standard protocol following bolus administration of intravenous contrast. CONTRAST:  116m OMNIPAQUE IOHEXOL 300 MG/ML  SOLN COMPARISON:  11/07/2019 FINDINGS: Lower chest: No focal infiltrate or sizable effusion is seen. Hepatobiliary: Gallbladder is decompressed with apparent multiple gallstones within. No biliary ductal dilatation is seen. Mild fatty infiltration of the liver is noted. Gallbladder wall thickening is again noted and stable. Pancreas: Unremarkable. No pancreatic ductal dilatation or surrounding inflammatory changes. Spleen: Normal in size without focal abnormality. Adrenals/Urinary Tract: Adrenal glands are within normal limits. Kidneys demonstrate a normal enhancement pattern bilaterally. Small nonobstructing right renal stone is again seen and stable. The bladder is decompressed. Stomach/Bowel: No obstructive or inflammatory changes of the colon are seen. The appendix is within normal limits. No inflammatory changes to suggest appendicitis are noted. Small-bowel and stomach appear within normal limits. Vascular/Lymphatic: No vascular calcifications are seen. Stable soft tissue nodules are noted anterior to the superior mesenteric vein as well as anterior to the left renal vein. Diffuse nodularity of the omentum is noted consistent with  omental implants. The overall appearance is stable. Reproductive: IUD is noted in place. Uterus is stable in appearance. Ovaries demonstrate bilateral cystic change stable from the prior exam. Other: Mild ascites is noted. Musculoskeletal: Degenerative changes of lumbar spine are noted. IMPRESSION: Decompressed gallbladder with persistent wall thickening and stones within. No acute biliary dilatation is noted. The gallbladder changes may be related to the underlying mild ascites. Ascites and omental implants stable from the prior exam. Stable adenopathy in the region of the mesentery and retroperitoneum similar to that seen on prior exam. Stable nonobstructing right renal stone. No new focal abnormality is noted. Electronically Signed   By: MInez CatalinaM.D.   On: 11/18/2019 21:17   DG Chest Port 1 View  Result Date: 11/18/2019 CLINICAL DATA:  Abdominal pain, history of abdominal malignancy EXAM: PORTABLE CHEST 1 VIEW COMPARISON:  CT chest 10/21/2019, radiograph 08/24/2019 FINDINGS: Several small pulmonary nodule seen on comparison CT are not well visualized on this radiographic examination. There is some mild basilar atelectatic change. Lungs are otherwise clear. No focal consolidation, convincing features of edema, pneumothorax or effusion. Right IJ approach Port-A-Cath tip terminates at the superior cavoatrial junction. Stable cardiomediastinal contours accounting for differences in technique and lung volumes. No acute osseous or soft tissue abnormality. IMPRESSION: 1. No acute cardiopulmonary abnormality. 2. Several small pulmonary nodule seen on comparison CT are not well visualized on this radiographic examination. Electronically Signed   By: PLovena LeM.D.   On: 11/18/2019 19:39    Procedures Procedures (including critical care time)  Medications Ordered in ED Medications  0.9 %  sodium chloride infusion (0 mLs Intravenous Stopped 11/19/19 0342)  HYDROmorphone (DILAUDID) injection 0.5 mg (0.5 mg  Intravenous Given 11/18/19 2001)  ondansetron (ZOFRAN) injection 4 mg (4 mg Intravenous Given 11/18/19 2001)  iohexol (OMNIPAQUE) 300 MG/ML solution 100 mL (100 mLs Intravenous Contrast Given 11/18/19 2051)  sodium chloride (PF) 0.9 % injection (  Given by Other 11/18/19 2128)  HYDROmorphone (DILAUDID) injection 0.5 mg (0.5 mg Intravenous Given 11/18/19 2240)  metoCLOPramide (REGLAN) injection 5 mg (5 mg Intravenous Given 11/19/19 0011)  fentaNYL (SUBLIMAZE) injection 50 mcg (50 mcg Intravenous Given 11/19/19 0011)  HYDROmorphone (DILAUDID) injection 1 mg (1 mg Intravenous Given 11/19/19 0126)  ondansetron (ZOFRAN) injection 4 mg (4 mg Intravenous Given 11/19/19 0126)  HYDROmorphone (DILAUDID) injection 1 mg (1 mg Intravenous Given 11/19/19 0225)    ED Course  I have reviewed the triage vital signs and the nursing notes.  Pertinent labs & imaging results that were available during my care of the patient were reviewed by me and considered in my medical decision making (see chart for details).  Clinical Course as of Nov 19 839  Mon Nov 18, 2019  2014 WBC(!): 23.2 [AH]    Clinical Course User Index [AH] Margarita Mail, PA-C   MDM Rules/Calculators/A&P                           This patient complains of abdominal pain , this involves an extensive number of treatment options, and is a complaint that carries with it a high risk of complications and morbidity.  The differential diagnosis includes The differential diagnosis for generalized abdominal pain includes, but is not limited to AAA, gastroenteritis, appendicitis, Bowel obstruction, Bowel perforation. Gastroparesis, DKA, Hernia, Inflammatory bowel disease, mesenteric ischemia, pancreatitis, peritonitis SBP, volvulus.   I Ordered, reviewed, and interpreted labs, which included CBC with leukocytosis (neulasta)/ mild anemia  UA negative Negative pregnancy Mildly elevated alk phos with stable cholelithiasis and gall bladder wall thickening in CT imaging  which I personally reviewed I ordered medication fluidsl reglan, zofran, narcotics for nausea and pain  Additionally I ordered imaging studies which included CXR and CT head and I independently visualized and interpreted imaging which showed No acute abnormalities on CXR- CT head results pending Additional history obtained from mother Previous records obtained and reviewed  Patient seen in shared visit with attending physician. Who agrees with assessment, work up , treatment, and plan for continued pain management as patient is on high doses of pain meds at home.    Critical interventions: fluids, pain meds, antiemetics  After the interventions stated above, I reevaluated the patient and found to still be in sig pain   Patient had recent neulasta shot- likely cause of Leukocytosis. Mother at bedside states she has had fall. Will get CT head. Expect d/c-I doubt her pain is related to her cholecystitis as pain is below her umbilicus. She does not appear to have a UTI. On CT imaging there does appear to be a large stool burden primarily in the ascending colon. She will continue with pain management here in the ER. Sign out given to PA upstill. Final Clinical Impression(s) / ED Diagnoses Final diagnoses:  Lower abdominal pain    Rx / DC Orders ED Discharge Orders         Ordered    dicyclomine (BENTYL) 20 MG tablet  2 times daily     Discontinue  Reprint     11/18/19 2348    meclizine (ANTIVERT) 25 MG tablet  3 times daily PRN     Discontinue  Reprint     11/18/19 2348           Margarita Mail,  PA-C 11/19/19 0277    Charlesetta Shanks, MD 11/19/19 [phone]

## 2019-11-18 NOTE — ED Triage Notes (Signed)
Pt with hx of abd cancer. Reports pain started this am in LLQ and RLQ with N/D and dysuria.

## 2019-11-18 NOTE — Discharge Instructions (Addendum)

## 2019-11-19 ENCOUNTER — Telehealth: Payer: Self-pay | Admitting: *Deleted

## 2019-11-19 ENCOUNTER — Other Ambulatory Visit: Payer: Self-pay | Admitting: Oncology

## 2019-11-19 DIAGNOSIS — C170 Malignant neoplasm of duodenum: Secondary | ICD-10-CM

## 2019-11-19 DIAGNOSIS — F900 Attention-deficit hyperactivity disorder, predominantly inattentive type: Secondary | ICD-10-CM

## 2019-11-19 MED ORDER — HYDROMORPHONE HCL 1 MG/ML IJ SOLN
1.0000 mg | Freq: Once | INTRAMUSCULAR | Status: AC
  Administered 2019-11-19: 1 mg via INTRAVENOUS
  Filled 2019-11-19: qty 1

## 2019-11-19 MED ORDER — HYDROMORPHONE HCL 4 MG PO TABS: 8.0000 mg | ORAL_TABLET | Freq: Four times a day (QID) | ORAL | 0 refills | Status: DC | PRN

## 2019-11-19 MED ORDER — ONDANSETRON HCL 4 MG/2ML IJ SOLN
4.0000 mg | Freq: Once | INTRAMUSCULAR | Status: AC
  Administered 2019-11-19: 4 mg via INTRAVENOUS
  Filled 2019-11-19: qty 2

## 2019-11-19 NOTE — Telephone Encounter (Signed)
Called to request refill on hydromorphone--is taking 2-3 tablets every 6 hours. Going to stay w/her sister for awhile in [place] and needs to get refilled before she goes there. Informed her that MD saw her CT results, which were stable. She is feeling better today.

## 2019-11-20 ENCOUNTER — Telehealth: Payer: Self-pay | Admitting: *Deleted

## 2019-11-20 NOTE — Telephone Encounter (Addendum)
Left VM requesting MD write a prescription for home care assistance.  Called and left message for referrals with [facility] to inquire if a face-to-face encounter is required for commericial insurance-[employer]. Noted urine culture results from sample collected in ER returned with 80,000 colonies of streptococcus angionsis (sensitivity not back yet). Will await sensitivity before deciding on treatment.

## 2019-11-21 LAB — URINE CULTURE: Culture: 80000 — AB

## 2019-11-22 ENCOUNTER — Telehealth: Payer: Self-pay

## 2019-11-22 NOTE — Telephone Encounter (Signed)
No treatment for [facility] ED 11/19/19 per Alfredia Client PA

## 2019-11-23 LAB — CULTURE, BLOOD (ROUTINE X 2)
Culture: NO GROWTH
Culture: NO GROWTH
Special Requests: ADEQUATE
Special Requests: ADEQUATE

## 2019-11-25 ENCOUNTER — Telehealth: Payer: Self-pay | Admitting: *Deleted

## 2019-11-25 DIAGNOSIS — C170 Malignant neoplasm of duodenum: Secondary | ICD-10-CM

## 2019-11-25 NOTE — Telephone Encounter (Signed)
Calling to request order for Home Health nurse and would also like a NT/aid as well since she is alone so often and needs assistance with ADLs. Informed her that Dr. Benay Spice will order nurse and once she is seen and evaluated by the agency, they can advise on how to obtain aid/sitter. Per Dr. Benay Spice: Home Health RN for assistance with medications, pain management and side effects of chemotherapy.

## 2019-11-26 ENCOUNTER — Telehealth: Payer: Self-pay | Admitting: *Deleted

## 2019-11-26 MED ORDER — AMOXICILLIN 500 MG PO CAPS: 500.0000 mg | ORAL_CAPSULE | Freq: Three times a day (TID) | ORAL | 0 refills | Status: AC

## 2019-11-26 MED ORDER — ONDANSETRON 8 MG PO TBDP: 8.0000 mg | ORAL_TABLET | Freq: Three times a day (TID) | ORAL | 0 refills | Status: AC | PRN

## 2019-11-26 NOTE — Telephone Encounter (Signed)
Notified patient of UTI per labs from her recent hospital encounter and it needs to be treated w/Amoxicillin 500 mg every 8 hours x 5 days. Her mother said to send to [facility] in [place] where she is staying for the week. Patient also requesting refill on her zofran be sent there as well. Informed mother that [facility] could not admit her due to staffing and referral now sent to [facility]. Mom requested to cancel [facility] and they prefer [facility] and the representative there will be calling our office next week.  [facility] and cancelled the referral.

## 2019-11-26 NOTE — Telephone Encounter (Signed)
Left message that due to staffing restraints they will not be able to service this patient. Faxed referral to [facility] at [phone]

## 2019-11-27 ENCOUNTER — Telehealth: Payer: Self-pay

## 2019-11-27 NOTE — Telephone Encounter (Signed)
Freddie Breech left a voicemail in reference to this patient stating " a verbal order is needed tfor an in home eval for hospice services a verbal order can be called to [phone] and kelly/Amy/Nikeeya(sp) any one of the three are able to accept the verbal order in addition  if neccessary Freddie Breech can be reached at [phone] social service." Md made aware

## 2019-11-29 ENCOUNTER — Other Ambulatory Visit: Payer: Self-pay | Admitting: Nurse Practitioner

## 2019-11-29 ENCOUNTER — Telehealth: Payer: Self-pay

## 2019-11-29 DIAGNOSIS — C170 Malignant neoplasm of duodenum: Secondary | ICD-10-CM

## 2019-11-29 MED ORDER — HYDROMORPHONE HCL 4 MG PO TABS: 12.0000 mg | ORAL_TABLET | Freq: Four times a day (QID) | ORAL | 0 refills | Status: DC | PRN

## 2019-11-29 NOTE — Telephone Encounter (Signed)
Patient called and stated that she is in [place] right now with her mother. She stated that she is going to run out of the Dilaudid tomorrow and is requesting that a refill be sent to [facility]. Ned Card NP is sending refill in for 5 days and patient is aware as she has a follow up in the office on 7/20.

## 2019-12-01 ENCOUNTER — Other Ambulatory Visit: Payer: Self-pay | Admitting: Oncology

## 2019-12-02 ENCOUNTER — Telehealth: Payer: Self-pay | Admitting: *Deleted

## 2019-12-02 MED FILL — Dexamethasone Sodium Phosphate Inj 100 MG/10ML: INTRAMUSCULAR | Qty: 1 | Status: AC

## 2019-12-02 MED FILL — Fosaprepitant Dimeglumine For IV Infusion 150 MG (Base Eq): INTRAVENOUS | Qty: 5 | Status: AC

## 2019-12-02 NOTE — Telephone Encounter (Signed)
Representative called to report she does not qualify for their hospice team admission due to patient wishing to continue chemotherapy. Informed her we were not aware of a hospice referral request. Patient's mother told this RN she just wanted [facility] referral made when they return to [place] from beach trip. Referral and chart information faxed to [phone]

## 2019-12-03 ENCOUNTER — Inpatient Hospital Stay: Payer: 59

## 2019-12-03 ENCOUNTER — Telehealth: Payer: Self-pay | Admitting: *Deleted

## 2019-12-03 ENCOUNTER — Inpatient Hospital Stay (HOSPITAL_BASED_OUTPATIENT_CLINIC_OR_DEPARTMENT_OTHER): Payer: 59 | Admitting: Nurse Practitioner

## 2019-12-03 ENCOUNTER — Encounter: Payer: Self-pay | Admitting: Nurse Practitioner

## 2019-12-03 ENCOUNTER — Other Ambulatory Visit: Payer: Self-pay

## 2019-12-03 VITALS — BP 159/114 | HR 119

## 2019-12-03 VITALS — BP 147/98 | HR 111 | Temp 98.7°F | Resp 18 | Wt 204.8 lb

## 2019-12-03 DIAGNOSIS — Z95828 Presence of other vascular implants and grafts: Secondary | ICD-10-CM

## 2019-12-03 DIAGNOSIS — C170 Malignant neoplasm of duodenum: Secondary | ICD-10-CM

## 2019-12-03 DIAGNOSIS — E860 Dehydration: Secondary | ICD-10-CM

## 2019-12-03 LAB — CMP (CANCER CENTER ONLY)
ALT: 27 U/L (ref 0–44)
AST: 24 U/L (ref 15–41)
Albumin: 3.9 g/dL (ref 3.5–5.0)
Alkaline Phosphatase: 161 U/L — ABNORMAL HIGH (ref 38–126)
Anion gap: 12 (ref 5–15)
BUN: 5 mg/dL — ABNORMAL LOW (ref 6–20)
CO2: 31 mmol/L (ref 22–32)
Calcium: 10.1 mg/dL (ref 8.9–10.3)
Chloride: 92 mmol/L — ABNORMAL LOW (ref 98–111)
Creatinine: 0.72 mg/dL (ref 0.44–1.00)
GFR, Est AFR Am: >60 mL/min (ref >60)
GFR, Estimated: >60 mL/min (ref >60)
Glucose, Bld: 119 mg/dL — ABNORMAL HIGH (ref 70–99)
Potassium: 3.2 mmol/L — ABNORMAL LOW (ref 3.5–5.1)
Sodium: 135 mmol/L (ref 135–145)
Total Bilirubin: 0.5 mg/dL (ref 0.3–1.2)
Total Protein: 7.7 g/dL (ref 6.5–8.1)

## 2019-12-03 LAB — CBC WITH DIFFERENTIAL (CANCER CENTER ONLY)
Abs Immature Granulocytes: 0.05 10*3/uL (ref 0.00–0.07)
Basophils Absolute: 0.1 10*3/uL (ref 0.0–0.1)
Basophils Relative: 1 %
Eosinophils Absolute: 0 10*3/uL (ref 0.0–0.5)
Eosinophils Relative: 0 %
HCT: 38.6 % (ref 36.0–46.0)
Hemoglobin: 12.6 g/dL (ref 12.0–15.0)
Immature Granulocytes: 1 %
Lymphocytes Relative: 19 %
Lymphs Abs: 1.7 10*3/uL (ref 0.7–4.0)
MCH: 28.1 pg (ref 26.0–34.0)
MCHC: 32.6 g/dL (ref 30.0–36.0)
MCV: 86.2 fL (ref 80.0–100.0)
Monocytes Absolute: 1 10*3/uL (ref 0.1–1.0)
Monocytes Relative: 11 %
Neutro Abs: 6.3 10*3/uL (ref 1.7–7.7)
Neutrophils Relative %: 68 %
Platelet Count: 356 10*3/uL (ref 150–400)
RBC: 4.48 MIL/uL (ref 3.87–5.11)
RDW: 16.7 % — ABNORMAL HIGH (ref 11.5–15.5)
WBC Count: 9.1 10*3/uL (ref 4.0–10.5)
nRBC: 0 % (ref 0.0–0.2)

## 2019-12-03 LAB — CEA (IN HOUSE-CHCC): CEA (CHCC-In House): 101.44 ng/mL — ABNORMAL HIGH (ref 0.00–5.00)

## 2019-12-03 MED ORDER — SODIUM CHLORIDE 0.9 % IV SOLN
Freq: Once | INTRAVENOUS | Status: AC
  Filled 2019-12-03: qty 250

## 2019-12-03 MED ORDER — SODIUM CHLORIDE 0.9% FLUSH
10.0000 mL | Freq: Once | INTRAVENOUS | Status: AC
  Administered 2019-12-03: 10 mL
  Filled 2019-12-03: qty 10

## 2019-12-03 MED ORDER — LORAZEPAM 0.5 MG PO TABS: 0.5000 mg | ORAL_TABLET | Freq: Three times a day (TID) | ORAL | 0 refills | Status: AC | PRN

## 2019-12-03 MED ORDER — MORPHINE SULFATE ER 100 MG PO TBCR: 100.0000 mg | EXTENDED_RELEASE_TABLET | Freq: Two times a day (BID) | ORAL | 0 refills | Status: AC

## 2019-12-03 MED ORDER — DICYCLOMINE HCL 20 MG PO TABS: 20.0000 mg | ORAL_TABLET | Freq: Two times a day (BID) | ORAL | 0 refills | Status: AC

## 2019-12-03 MED ORDER — MECLIZINE HCL 25 MG PO TABS: 25.0000 mg | ORAL_TABLET | Freq: Three times a day (TID) | ORAL | 0 refills | Status: AC | PRN

## 2019-12-03 MED ORDER — HYDROXYZINE HCL 25 MG PO TABS: 25.0000 mg | ORAL_TABLET | Freq: Three times a day (TID) | ORAL | 1 refills | Status: AC | PRN

## 2019-12-03 MED ORDER — HEPARIN SOD (PORK) LOCK FLUSH 100 UNIT/ML IV SOLN
500.0000 [IU] | Freq: Once | INTRAVENOUS | Status: AC
  Administered 2019-12-03: 500 [IU]
  Filled 2019-12-03: qty 5

## 2019-12-03 MED ORDER — HYDROMORPHONE HCL 4 MG PO TABS: 12.0000 mg | ORAL_TABLET | Freq: Four times a day (QID) | ORAL | 0 refills | Status: DC | PRN

## 2019-12-03 NOTE — Progress Notes (Signed)
Patiented with near syncope episode and dizziness. Denies vomiting at this time but felt nauseas. Vitals were taken and patient was seen in flush where I accessed her and started fluids. Lattie Haw also examined her and she was moved to the doctor side. Also made aware that she would need to have dressing changed if she went home with pump today.

## 2019-12-03 NOTE — Progress Notes (Signed)
Medication refill Vistaril  Bentyl and Antivert all refilled pt called message left to make aware also left message to restart potassium 40meq

## 2019-12-03 NOTE — Telephone Encounter (Addendum)
Call from [facility] referral coordinator: They are not able to accept patient due to staffing shortage. Currently only accepting Medicare patients until staffing is back to normal. Patient notified and agrees to all nurse to refer her to [facility]. Referral faxed to # [phone]. Received call from [facility] w/[facility]. Their staffing does not allow them to accept the patient either.

## 2019-12-03 NOTE — Progress Notes (Addendum)
[facility] OFFICE PROGRESS NOTE   Diagnosis: Small bowel carcinoma  INTERVAL HISTORY:   Hailey Houston returns as scheduled.  She completed cycle 8 FOLFIRI 11/12/2019.  She is having nausea.  No vomiting.  No mouth sores.  No diarrhea.  She feels lightheaded/faint today.  She reports overall good fluid intake except today she has had nothing to eat or drink thus far.  Her sister reports multiple falls over the past few weeks and does not feel fluid intake has been adequate.  Intermittent shortness of breath, not exertion related.  No chest pain.  No leg swelling or calf pain.  No fever or cough.  Pain is poorly controlled.  She indicates the pain is located at the low abdomen, upper abdomen and back.  She is taking Dilaudid every 6 hours and MS Contin 60 mg every 12 hours.  Objective:  Vital signs in last 24 hours:  Blood pressure (!) 147/98, pulse (!) 111, temperature 98.7 F (37.1 C), temperature source Oral, resp. rate 18, weight 204 lb 12.8 oz (92.9 kg), SpO2 99 %.    HEENT: No thrush or ulcers. Resp: Lungs clear bilaterally. Cardio: Regular, tachycardic. GI: No hepatomegaly.  No mass.  Tender across the upper abdomen and the very low mid abdomen.   Vascular: No leg edema.  Calves soft and nontender. Neuro: Alert, follows commands. Skin: Warm and dry.  Skin turgor is intact. Port-A-Cath without erythema.   Lab Results:  Lab Results  Component Value Date   WBC 9.1 12/03/2019   HGB 12.6 12/03/2019   HCT 38.6 12/03/2019   MCV 86.2 12/03/2019   PLT 356 12/03/2019   NEUTROABS 6.3 12/03/2019    Imaging:  No results found.  Medications: I have reviewed the patient's current medications.  Assessment/Plan: 1. Adenocarcinoma of the duodenum  CT abdomen/pelvis 06/16/2018-changes of pancreatitis, fullness at the head of the pancreas without a defined mass, multiple subcentimeter peripancreatic lymph nodes, distal common bile duct stent, multiple nodules at the  lung bases  EUS 06/14/2018-ulcerated mucosa/mass at the distal duodenal bulb with extension to the pancreas head, portal adenopathy, biopsy of duodenal mass-adenocarcinoma;foundation 1-microsatellite stable, tumor mutational burden 1, [id number] mutation  ERCP 06/14/2018-distal duodenal bulb mass, 1 cm proximal to the ampulla causing biliary obstruction, status post placement of a metal common bile duct stent  CT chest 06/20/2018-bilateral pulmonary nodules consistent with metastatic disease, supraclavicular, mediastinal, and hilar lymphadenopathy, changes of pancreatitis  Ultrasound-guided biopsy of a left supraclavicular lymph node 06/22/2018-metastatic adenocarcinoma  Cycle 1 FOLFOX 07/03/2018  Cycle 2 FOLFOX 07/17/2018  Cycle 3 FOLFOX 08/06/2018, Neulasta added  Cycle 4 FOLFOX 08/21/2018  CTs 09/06/2018-numerous small lung nodules generally decreased in size; new rounded masslike subpleural opacity right upper lobe with some evidence of internal cavitation,measuring 4.0 cm. Minimal fat stranding about the pancreas and duodenum which is decreased. Discrete mass is not noted. Previously seen common bile duct stent absent. Gallbladder wall thickening with faintly calcified gallstones. No biliary ductal dilatation.  Cycle 5 FOLFOX 09/10/2018(oxaliplatin dose reduceddue to elevated liver enzymes)  Cycle 6 FOLFOX 09/24/2018(oxaliplatin further dose reduced due to elevated liver enzymes)  Cycle 7 FOLFOX 10/09/2018  Cycle 8 FOLFOX 10/22/2018  CTs 11/12/2018-continued improvement in lung metastases.  Maintenance Xeloda, daily dosing 11/25/2018  CTs 02/04/2019-decrease in bilateral lung nodules, many have resolved, no evidence of disease progression, no evidence of metastatic disease to the abdomen or pelvis  Xeloda continued  Xeloda placed on hold 02/26/2019 due to hand-foot syndrome, resumed 10/21/2020at a dose  of 1000 mg twice daily  05/13/2019-mixed response of (increased size of left  upper lobe nodule, decreased size of right lower lobe nodule), new "shotty "abdominal retroperitoneal lymph nodes, new tiny nodules in the left abdominal omentum  Elevated CEA 05/13/2019  CTs12/28/2020-increased size of the left upper lobe nodule, decreased right lower lobe nodule, new small retroperitoneal nodes, new subcentimeter nodules in the left omental fat  Xeloda continued  Xeloda placed on hold 06/28/2019  CTs 07/09/2019-when enlarged pulmonary nodules, new right hilar and pretracheal lymph nodes, enlargement of retroperitoneal lymph nodes, increased omental nodularity and stranding, gallbladder wall thickening-new, increase in ascites  Cycle 1 FOLFIRI 07/15/2019  Cycle 2 FOLFIRI 08/06/2019, Neulasta  Cycle 3 FOLFIRI 08/20/2019, Neulasta  CT scan 08/30/2019-omental caking along the transverse mesocolon slightly more pronounced; discrete duodenal mass not seen; adenopathy at the level of the SMA stable and other subcentimeter retroperitoneal lymph nodes stable. Mild periappendiceal fat stranding.  Cycle 4 FOLFIRI 09/04/2019, Neulasta  Cycle 5 FOLFIRI 09/17/2019, Irinotecan dose reduced due to neutropenia; Neulasta  Cycle 6 FOLFIRI 10/01/2019, Neulasta  CTs 10/21/2019-decrease in size of and progressive cavitation within pulmonary nodules. Decreased thoracic adenopathy. Minimal improvement in abdominopelvic adenopathy/metastasis. Minimal improvement in extensive peritoneal metastasis.  Cycle 7 FOLFIRI 10/22/2019, Neulasta  CT abdomen/pelvis 11/07/2019-gallbladder wall thickening, stable omental implants, stable small volume ascites, no change in retroperitoneal adenopathy  Cycle 8 FOLFIRI 11/12/2019, Neulasta  CT abdomen/pelvis 11/18/2019-ascites and omental implants stable from the prior exam.  Stable adenopathy in the region of the mesentery and retroperitoneum.  No new focal abnormality.  Brain CT 11/18/2019-no acute intracranial pathology. 2.Biliary obstruction secondary to the duodenal  mass, status post placement of a bile duct stent 06/14/2018 3.Post ERCP pancreatitis 06/16/2018 4.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 Abdominal pain-secondary to the small bowel carcinoma versus pancreatitis  9. Rash-improved 10. 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  She has worsening abdominal pain and her performance status is declining.  She understands the pain is most likely due to carcinomatosis.  Dr. Benay Spice discussed supportive/comfort care with a hospice referral versus a follow-up appointment with Dr. Aleatha Borer at [facility] for other treatment options, clinical trial availability.  She would like to proceed with the appointment with Dr. Aleatha Borer.  We made a referral today.  Today she appears dehydrated.  She is receiving IV fluids.  Her pain is poorly controlled.  She will increase MS Contin to 100 mg every 12 hours and continue Dilaudid every 6 hours as needed.  We discussed a referral to palliative care.  She agrees with this plan.  The referral has been initiated.  She will return for a follow-up appointment in approximately 2 weeks.  We are available to see her sooner if needed.  Patient seen with Dr. Benay Spice.      Ned Card ANP/GNP-BC   12/03/2019  2:48 PM This was a shared visit with Ned Card.  Hailey Houston was interviewed and examined.  She has completed 8 cycles of FOLFIRI.  The abdominal pain has worsened.  I suspect the pain is due to carcinomatosis.  We reviewed recent CT images again today.  I recommend discontinuing FOLFIRI.  We adjusted the narcotic analgesic and made a home palliative care  referral for pain management.  She does not wish to enter hospice care at present.  I will refer her to Dr. Aleatha Borer to discuss treatment options.  We can add bevacizumab, but I think there is only a small chance of clinical benefit.  Julieanne Manson, MD

## 2019-12-04 ENCOUNTER — Telehealth: Payer: Self-pay | Admitting: *Deleted

## 2019-12-04 NOTE — Telephone Encounter (Signed)
Sister left VM that patient has not been able to void today and they have been pushing fluids.  @ 1420: Called back to inquire last void time and how much po she has had? Have they tried to run water for her to hear or put in shower/tub or put hands in warm water, which can sometimes help> There was no answer, so had to leave VM.

## 2019-12-04 NOTE — Telephone Encounter (Signed)
Sees Dr. Aleatha Borer on 12/09/19 at 3 pm. Patient has been notified.

## 2019-12-05 ENCOUNTER — Telehealth: Payer: Self-pay | Admitting: Nurse Practitioner

## 2019-12-05 ENCOUNTER — Telehealth: Payer: Self-pay | Admitting: *Deleted

## 2019-12-05 NOTE — Telephone Encounter (Signed)
Called Denise at [facility] [phone]) for referral to Palliative Team. They will be able to reach out to patient on 7/23 or 7/26. Notified sister, Lanelle Bal of above and that [facility] also not able to accept her due to staffing. Suggested she call [facility] and [facility] to see if they are accepting new commericial insurance patients. They are also trying to mobilize family to take turns staying with her. Have appointment with Dr. Aleatha Borer on Monday as well.

## 2019-12-05 NOTE — Telephone Encounter (Signed)
Scheduled per 7/20 los. Unable to reach pt. Left voicemail with appt time and date. Sent referral to HIM pool.

## 2019-12-06 ENCOUNTER — Telehealth: Payer: Self-pay | Admitting: Internal Medicine

## 2019-12-06 NOTE — Telephone Encounter (Signed)
Spoke with patient regarding Palliative services and all questions were answered and she was in agreement with scheduling a visit.  I have scheduled an In-person Consult for 12/24/19 @ 11 AM.

## 2019-12-10 ENCOUNTER — Telehealth: Payer: Self-pay | Admitting: *Deleted

## 2019-12-10 NOTE — Telephone Encounter (Signed)
Saw Dr. Aleatha Borer at [facility] this week. Are requesting referral to Hospice as soon as possible. Need help w/her physical care and medication management. Called [facility] and spoke with Lanelle Bal and requested admission to full hospice services. They will reach out to the sister. Notified Lanelle Bal that [facility] will be calling them.

## 2019-12-11 ENCOUNTER — Telehealth: Payer: Self-pay | Admitting: *Deleted

## 2019-12-11 ENCOUNTER — Other Ambulatory Visit: Payer: Self-pay | Admitting: Nurse Practitioner

## 2019-12-11 DIAGNOSIS — C170 Malignant neoplasm of duodenum: Secondary | ICD-10-CM

## 2019-12-11 MED ORDER — HYDROMORPHONE HCL 4 MG PO TABS: 12.0000 mg | ORAL_TABLET | Freq: Four times a day (QID) | ORAL | 0 refills | Status: AC | PRN

## 2019-12-11 NOTE — Telephone Encounter (Signed)
Called to report they received referral and are planning to visit her tomorrow (today is her birthday). Intake nurse will be calling later today for a verbal order. They have already reached out to sister.

## 2019-12-17 ENCOUNTER — Inpatient Hospital Stay: Payer: 59 | Attending: Oncology | Admitting: Oncology

## 2019-12-17 ENCOUNTER — Other Ambulatory Visit: Payer: Self-pay | Admitting: Oncology

## 2019-12-23 ENCOUNTER — Telehealth: Payer: Self-pay | Admitting: Internal Medicine

## 2019-12-23 NOTE — Telephone Encounter (Signed)
Rec'd a call from patient's daughter wanting to cancel the Palliative Consult that was scheduled for 8/10, she said that patient has been admitted to [facility]

## 2019-12-24 ENCOUNTER — Other Ambulatory Visit: Payer: Self-pay | Admitting: Internal Medicine

## 2019-12-30 ENCOUNTER — Ambulatory Visit: Payer: 59 | Admitting: Psychiatry

## 2019-12-30 ENCOUNTER — Telehealth: Payer: Self-pay | Admitting: *Deleted

## 2019-12-31 ENCOUNTER — Telehealth: Payer: Self-pay | Admitting: *Deleted

## 2019-12-31 NOTE — Telephone Encounter (Signed)
Call from hospice that patient died in her home last night at 10:12 pm. Body to [employer].

## 2020-01-01 ENCOUNTER — Other Ambulatory Visit: Payer: Self-pay | Admitting: Oncology

## 2020-01-15 NOTE — Telephone Encounter (Signed)
Called sister, Hailey Houston to determine if Hailey Houston would want a telephone/video visit with Dr.Sherrill tomorrow at 4 pm? Sister reports that they think Hailey Houston will die in the next 24 hours. She is caring for her as well as her mother, who was diagnosed with COVID 2 weeks ago and still having trouble. Hospice RN bringing full PPE today for her sons to put on to visit. Wanted to transfer to [facility], but it was felt she would not survive the transport.

## 2020-01-15 DEATH — deceased

## 2020-02-19 IMAGING — CT CT CHEST W/ CM
2 of 5 series · 13 of 36 positions shown, 16 images · IV contrast (omnipaque)
Comparison: 02/04/2019

CLINICAL DATA: Follow-up metastatic duodenal carcinoma. Ongoing
chemotherapy. Restaging.

EXAM:
CT CHEST, ABDOMEN, AND PELVIS WITH CONTRAST
TECHNIQUE: Multidetector CT imaging of the chest, abdomen and pelvis was
performed following the standard protocol during bolus
administration of intravenous contrast.
CONTRAST:  100mL OMNIPAQUE IOHEXOL 300 MG/ML  SOLN

[Series 2: cap with · axial · 0.98mm/px · z∈[-709,-144]mm · 10 of 139 slices shown, 13 images]
[im 13/139  mediastinal]
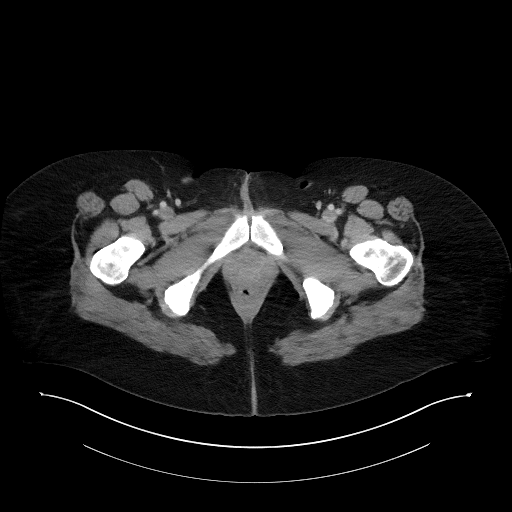
[im 13/139  lung]
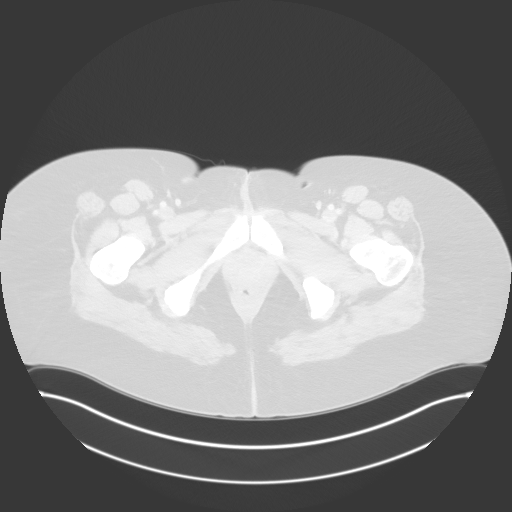
[im 26/139  lung]
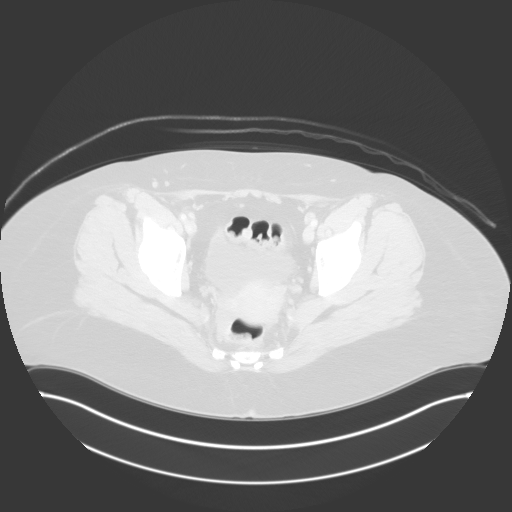
[im 38/139  lung]
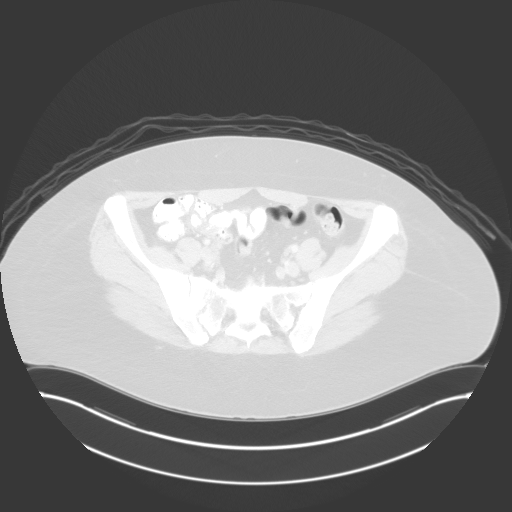
[im 51/139  lung]
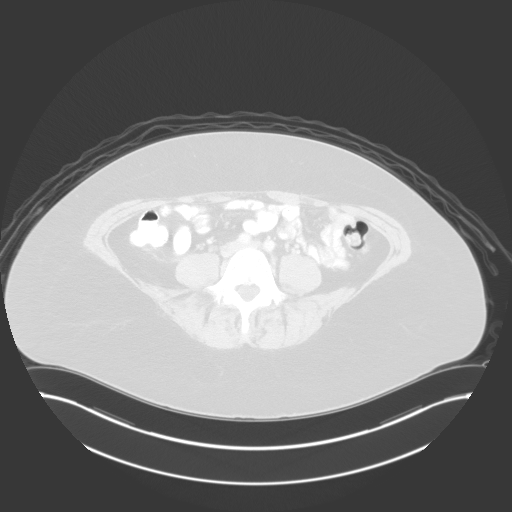
[im 63/139  mediastinal]
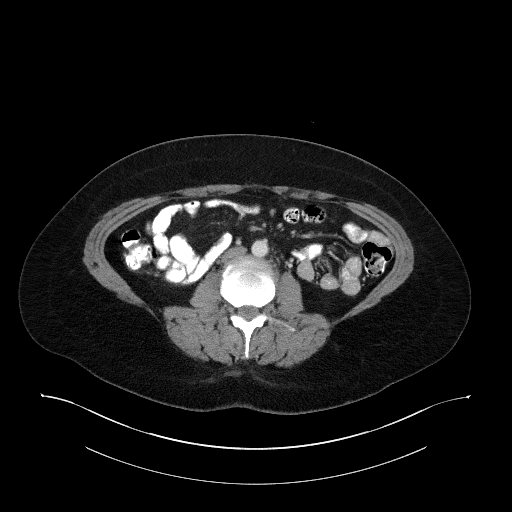
[im 63/139  lung]
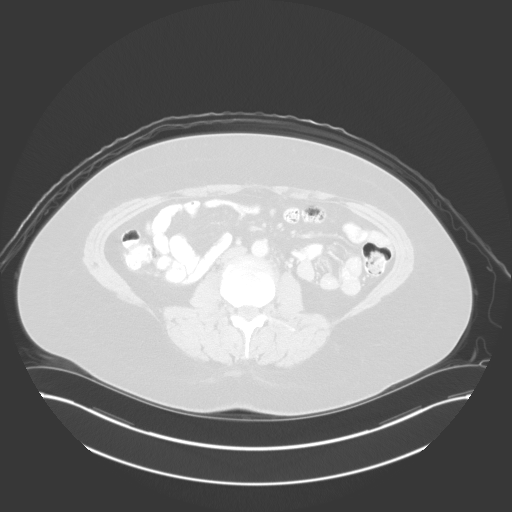
[im 76/139  lung]
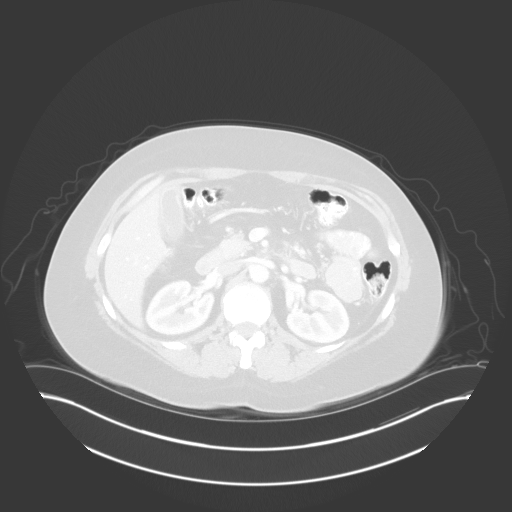
[im 88/139  lung]
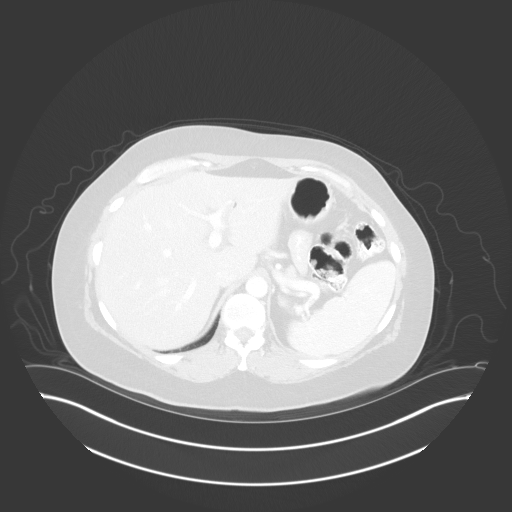
[im 101/139  lung]
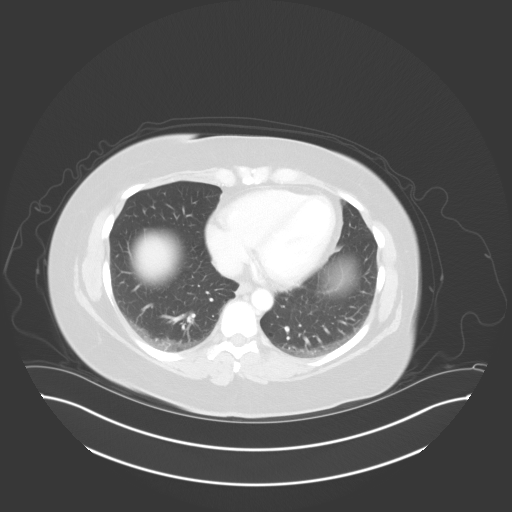
[im 113/139  mediastinal]
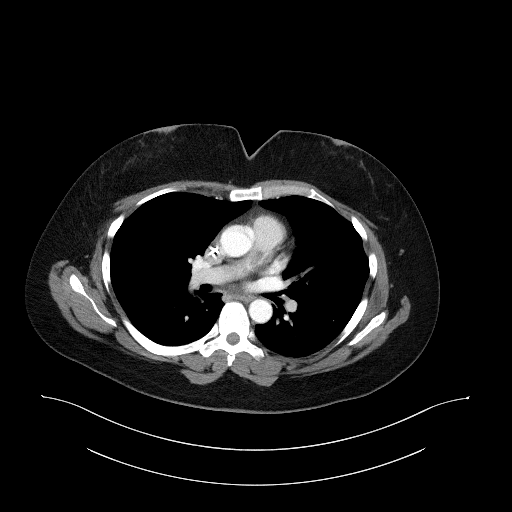
[im 113/139  lung]
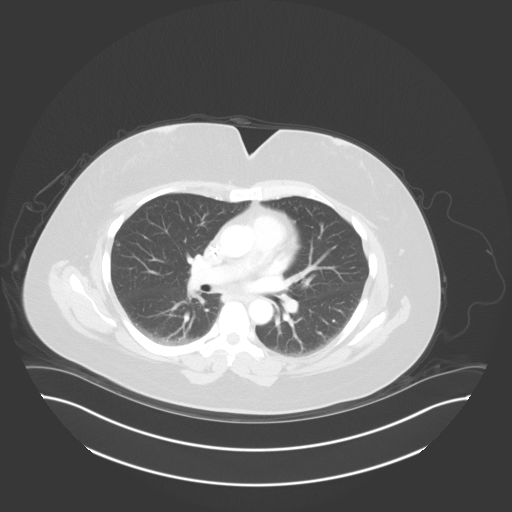
[im 126/139  lung]
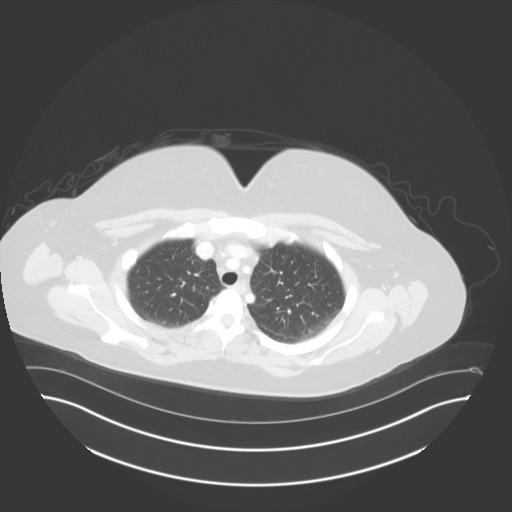

[Series 5: coronals · coronal · 0.83mm/px · 3 of 154 slices shown]
[im 31/154  lung]
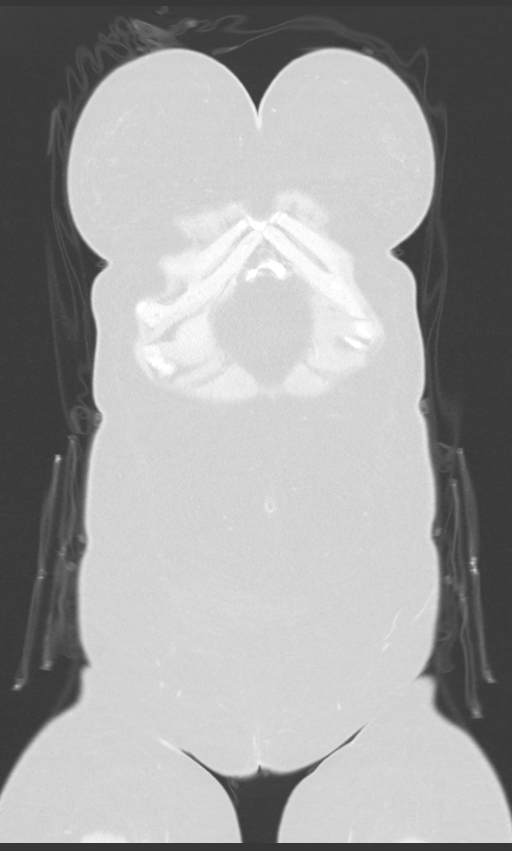
[im 62/154  lung]
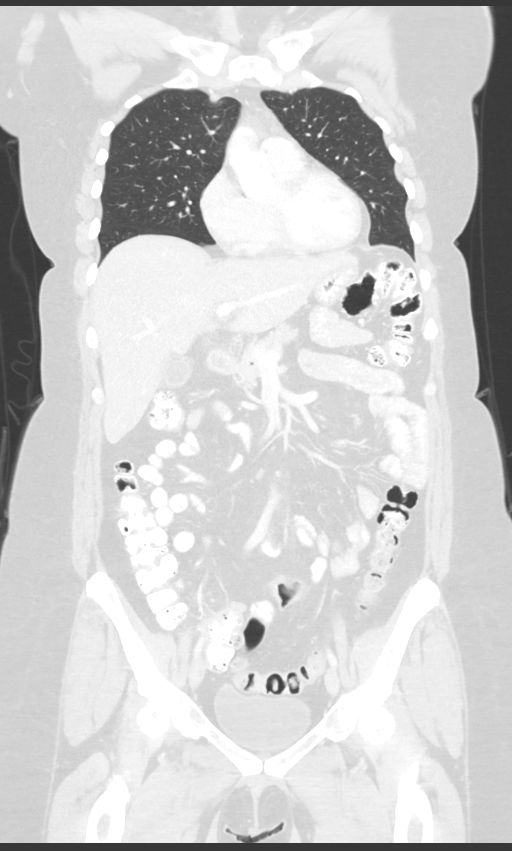
[im 92/154  lung]
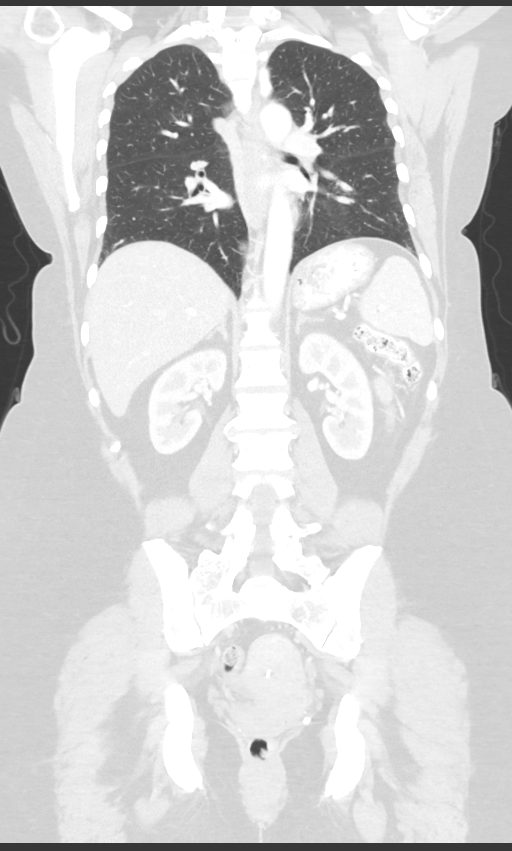

[13 of 36 positions shown; findings below may reference images not displayed]

FINDINGS: CT CHEST FINDINGS

Cardiovascular: No acute findings.

Mediastinum/Lymph Nodes: No masses or pathologically enlarged lymph
nodes identified.

Lungs/Pleura: Right lower lobe nodule in the posterior costophrenic
sulcus now measures 11 x 8 mm compared to 15 x 9 mm previously. 5 mm
pulmonary nodule in the posterior right upper lobe on image 73/7 is
stable since prior exam. A pulmonary nodule in the anterior left
upper lobe on image 35/7 now measures 7 mm compared to 4 mm
previously. No other enlarging or new pulmonary nodules are
identified. No evidence of pulmonary infiltrate or pleural effusion.

Musculoskeletal:  No suspicious bone lesions identified.

CT ABDOMEN AND PELVIS FINDINGS

Hepatobiliary: No masses identified. Gallbladder is unremarkable. No
evidence of biliary ductal dilatation.

Pancreas:  No mass or inflammatory changes.

Spleen:  Within normal limits in size and appearance.

Adrenals/Urinary tract: No masses or hydronephrosis. Stable 3 mm
calculus in upper pole of right kidney.

Stomach/Bowel: No evidence of obstruction, inflammatory process, or
abnormal fluid collections. Normal appendix visualized.

Vascular/Lymphatic: New 11 mm retroperitoneal lymph node seen
between the SMA and left renal vein on image 63/2. Two new left
paraaortic lymph nodes are also seen, largest measuring 10 mm (image
71/2). No abdominal aortic aneurysm.

Reproductive: No mass or other significant abnormality identified.
IUD again seen in expected position in the uterus.

Other: Several tiny sub-cm nodules are seen within the left
abdominal omental fat (e.g. Images 69-75/series 2 which are new,
suspicious for early peritoneal carcinoma.

Musculoskeletal:  No suspicious bone lesions identified.
IMPRESSION: 1. Mixed response of pulmonary metastases, with increased size of
left upper lobe nodule and decreased size of posterior right lower
lobe nodule.
2. New shotty abdominal retroperitoneal lymph nodes, suspicious for
metastatic disease.
3. New tiny sub-cm nodules in left abdominal omental fat. Early
peritoneal carcinomatosis cannot be excluded.

## 2020-05-23 IMAGING — US US ABDOMEN LIMITED
1 series · 14 of 25 positions shown · non-contrast
Comparison: CT abdomen and pelvis 07/09/2019

CLINICAL DATA: Small-bowel cancer question ascites sufficient for
paracentesis

EXAM:
LIMITED ABDOMEN ULTRASOUND FOR ASCITES
TECHNIQUE: Limited ultrasound survey for ascites was performed in all four
abdominal quadrants.

[Series 1: us abdomen limited · 14 of 34 slices shown]
[im 1/34]
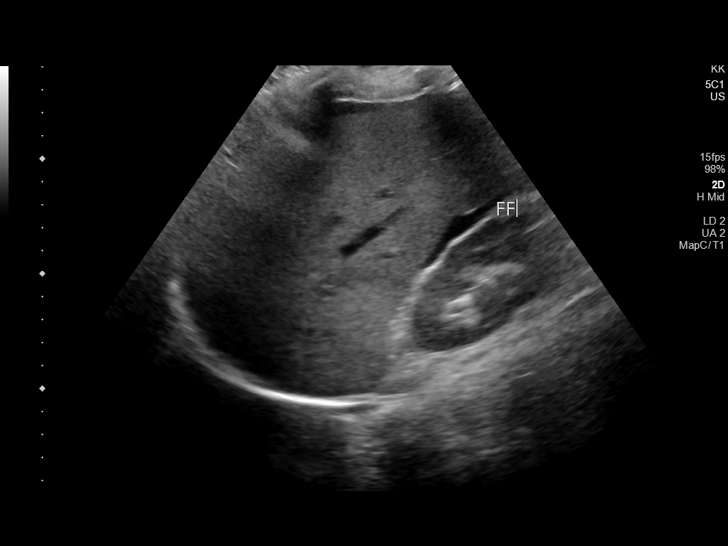
[im 3/34]
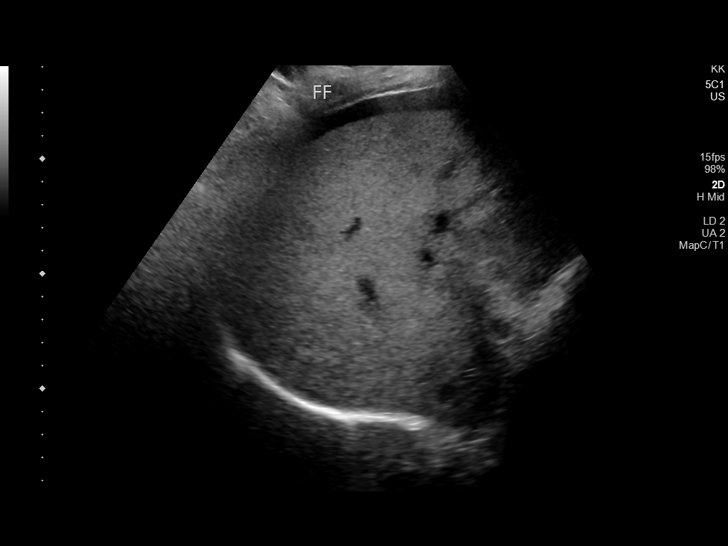
[im 6/34]
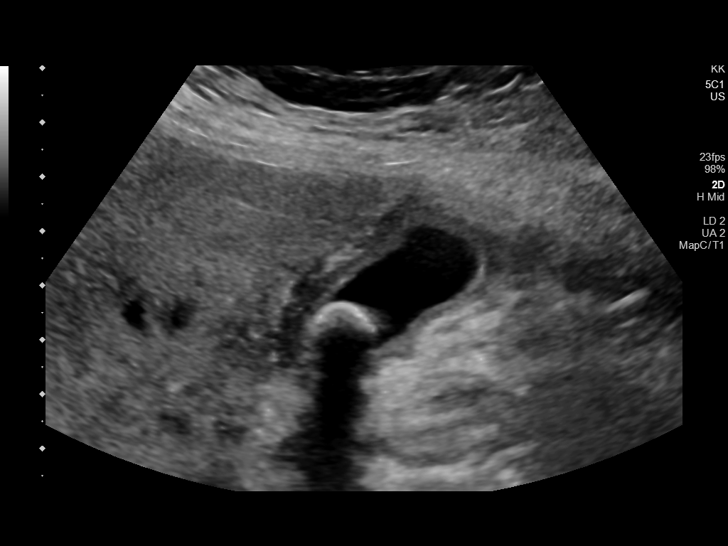
[im 9/34]
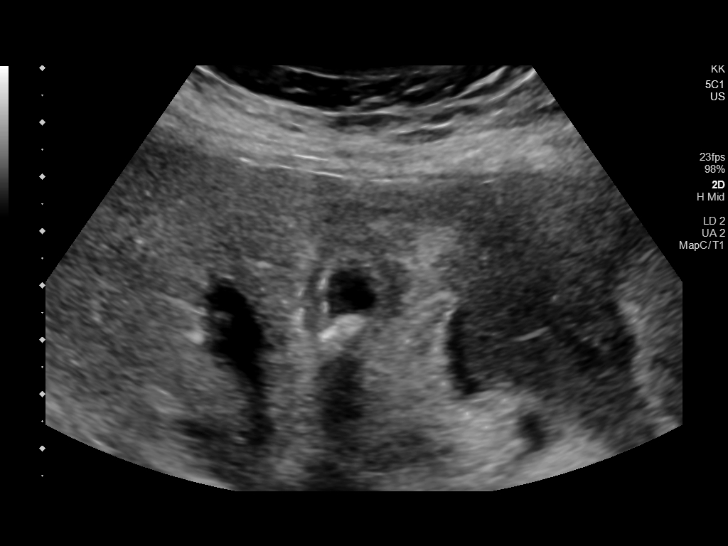
[im 12/34]
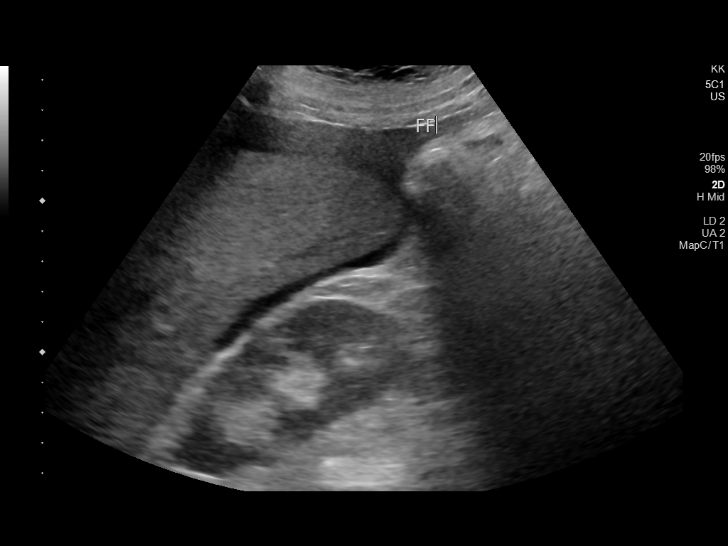
[im 13/34]
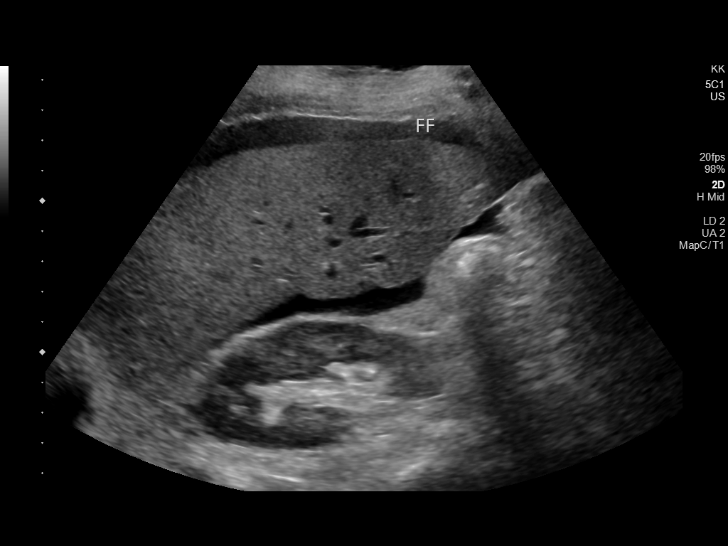
[im 16/34]
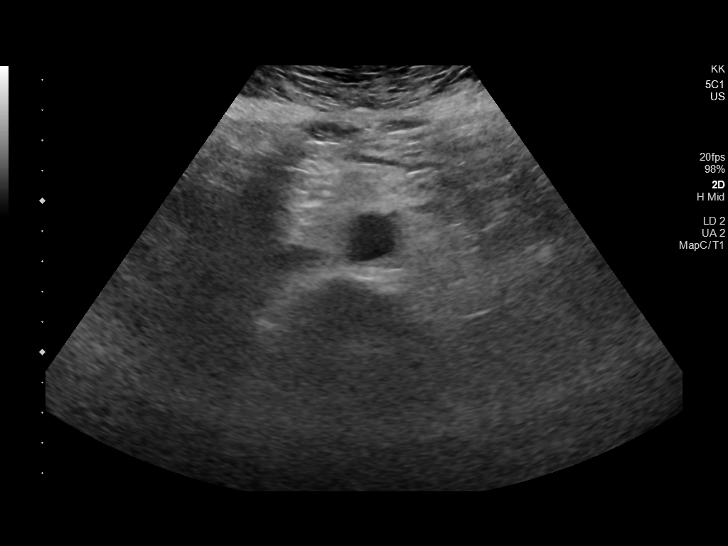
[im 18/34]
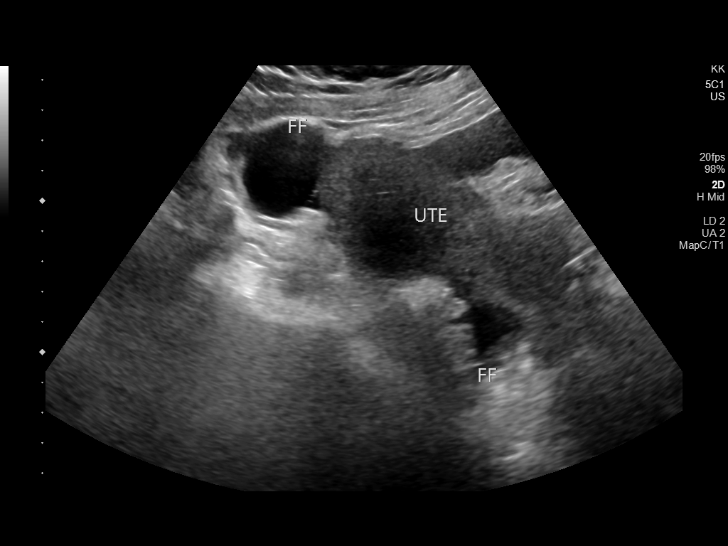
[im 21/34]
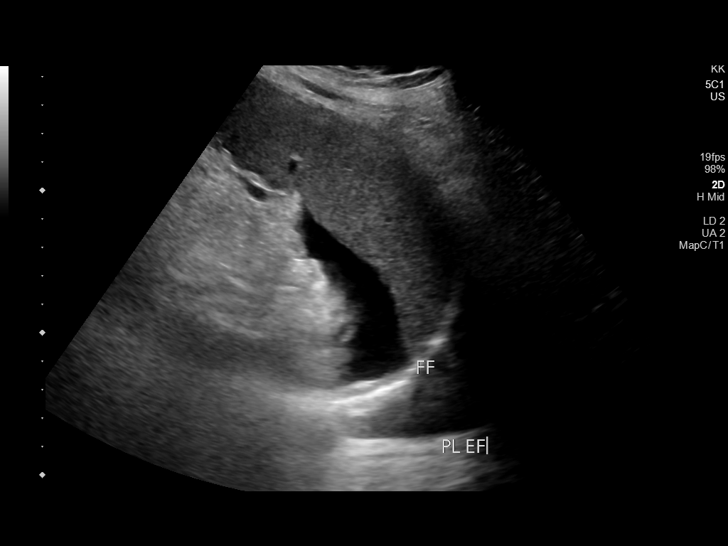
[im 23/34]
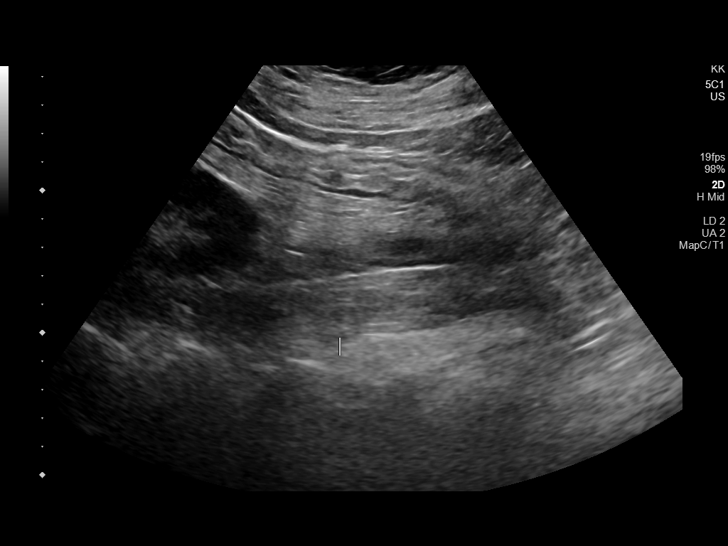
[im 25/34]
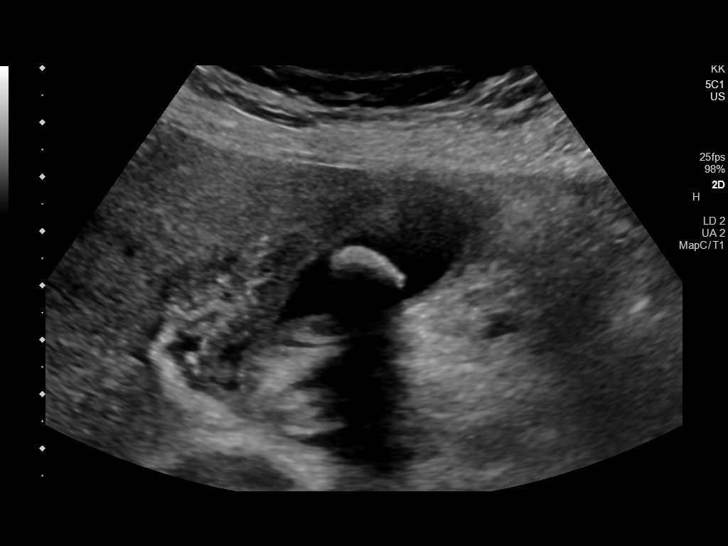
[im 28/34]
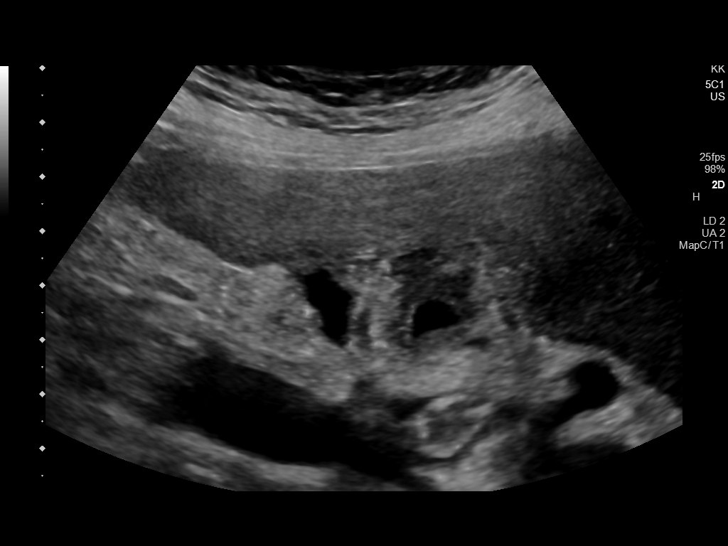
[im 31/34]
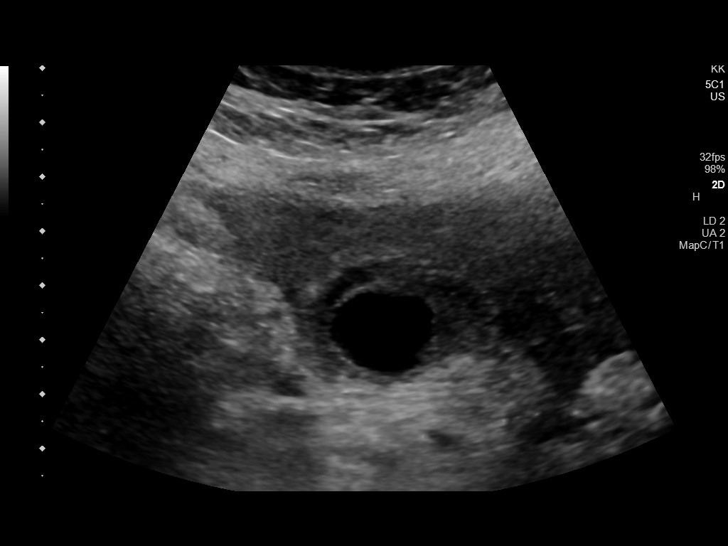
[im 34/34]
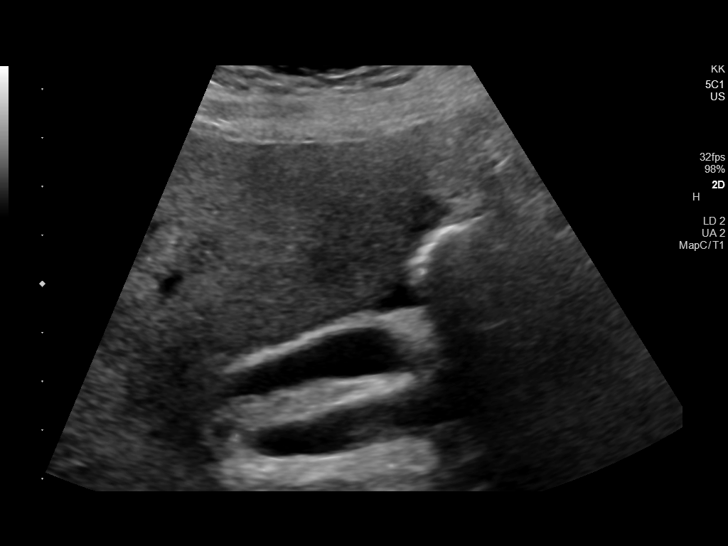

[14 of 25 positions shown; findings below may reference images not displayed]

FINDINGS: Minimal ascites identified perihepatic, perisplenic and in pelvis.

Volume of ascites is insufficient for paracentesis.

Incidentally noted 14 mm calcified shadowing gallstone with
thickened gallbladder wall, a nonspecific finding in the setting of
ascites.

Small LEFT pleural effusion noted.
IMPRESSION: Low volume ascites insufficient for paracentesis.

Cholelithiasis with nonspecific thickening of the gallbladder wall.

## 2020-06-08 IMAGING — CT CT ABD-PELV W/ CM
2 of 5 series · 16 of 46 positions shown, 18 images · IV contrast (omnipaque)
Comparison: 07/09/2019, 08/24/2019

CLINICAL DATA: Right lower quadrant abdominal pain, history of
metastatic duodenal cancer

EXAM:
CT ABDOMEN AND PELVIS WITH CONTRAST
TECHNIQUE: Multidetector CT imaging of the abdomen and pelvis was performed
using the standard protocol following bolus administration of
intravenous contrast.
CONTRAST:  100mL OMNIPAQUE IOHEXOL 300 MG/ML  SOLN

[Series 2: axial st · axial · 0.81mm/px · z∈[-512,-82]mm · 13 of 100 slices shown, 15 images]
[im 7/100  soft-tissue]
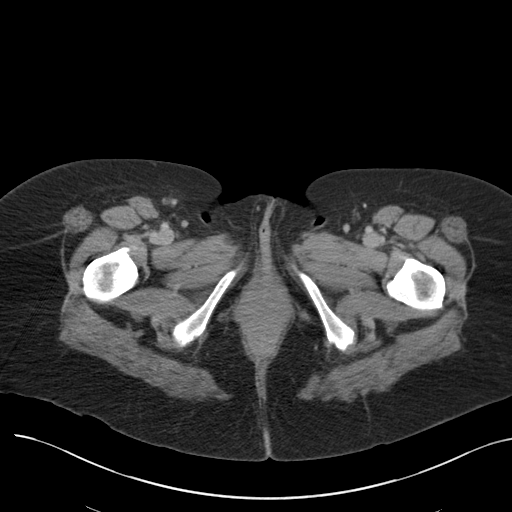
[im 7/100  bone]
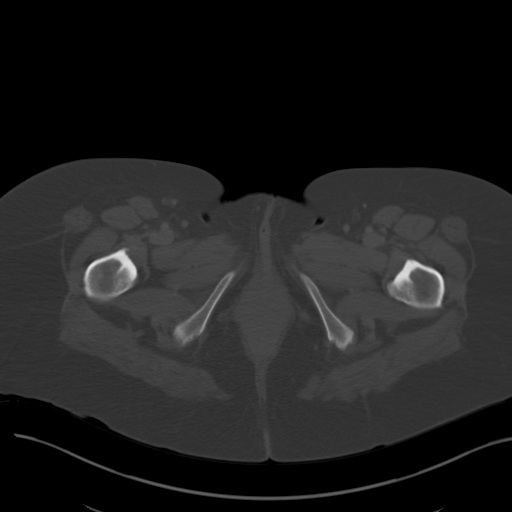
[im 13/100  soft-tissue]
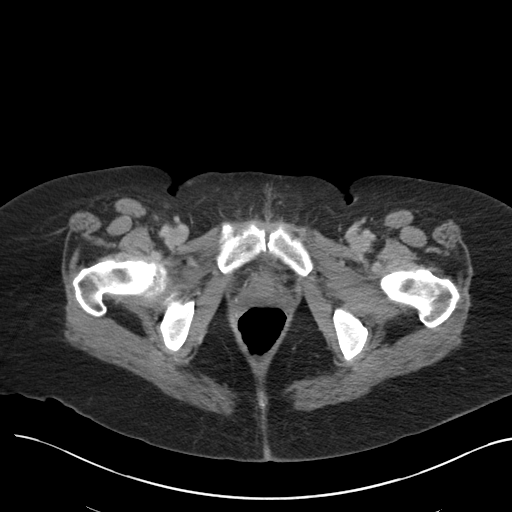
[im 19/100  soft-tissue]
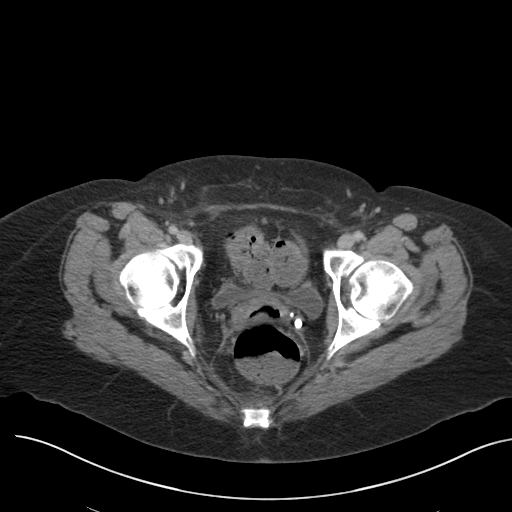
[im 31/100  soft-tissue]
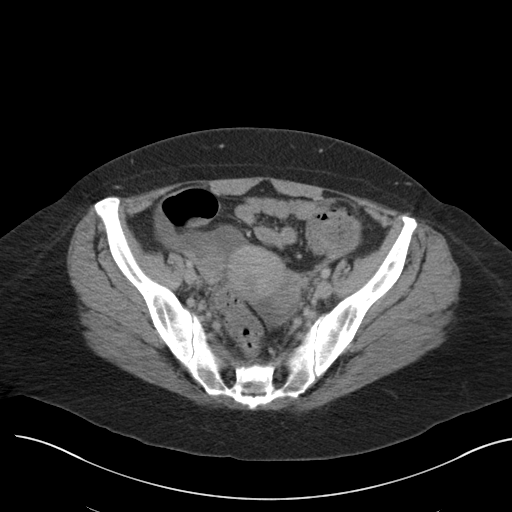
[im 38/100  soft-tissue]
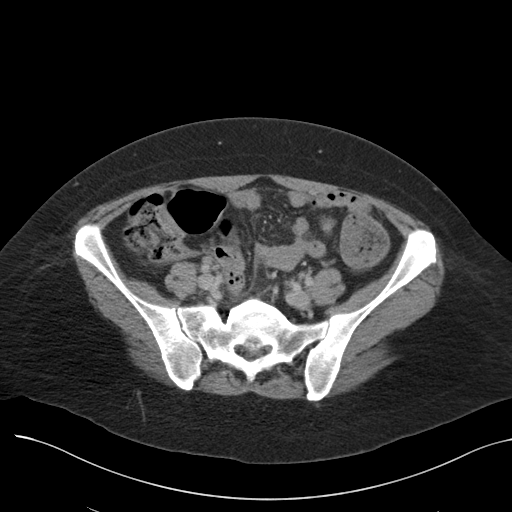
[im 44/100  soft-tissue]
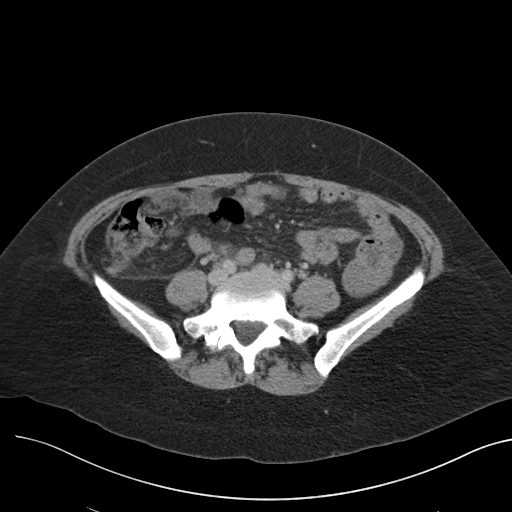
[im 50/100  soft-tissue]
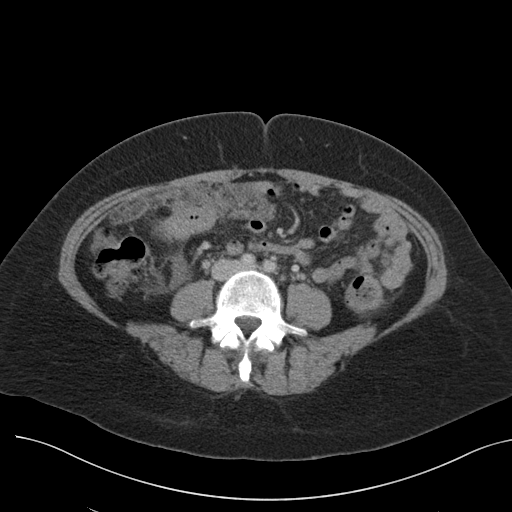
[im 56/100  soft-tissue]
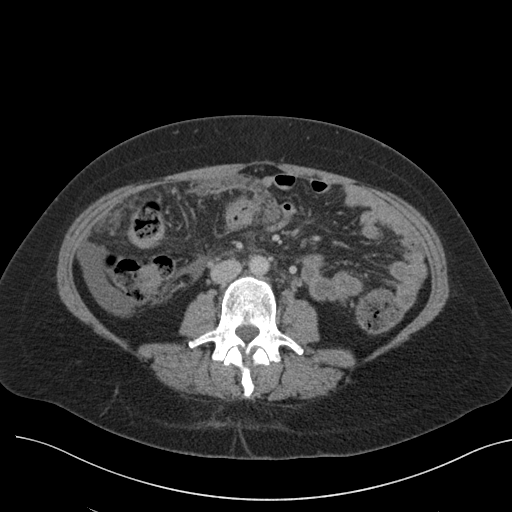
[im 62/100  soft-tissue]
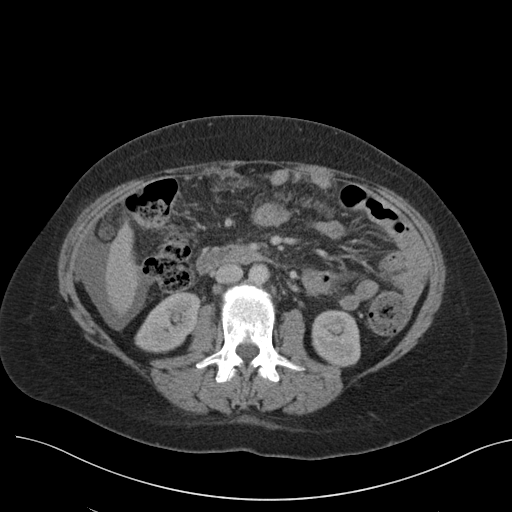
[im 62/100  bone]
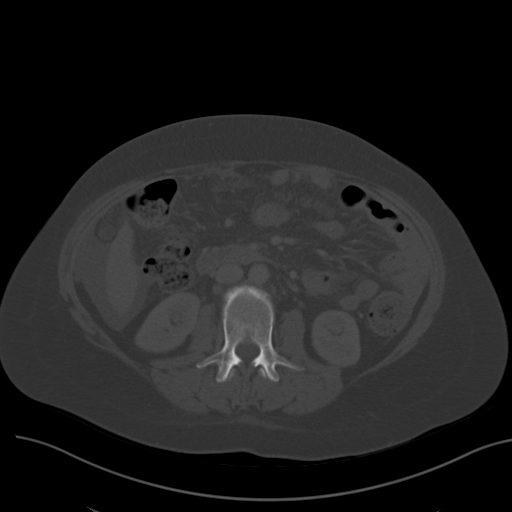
[im 69/100  soft-tissue]
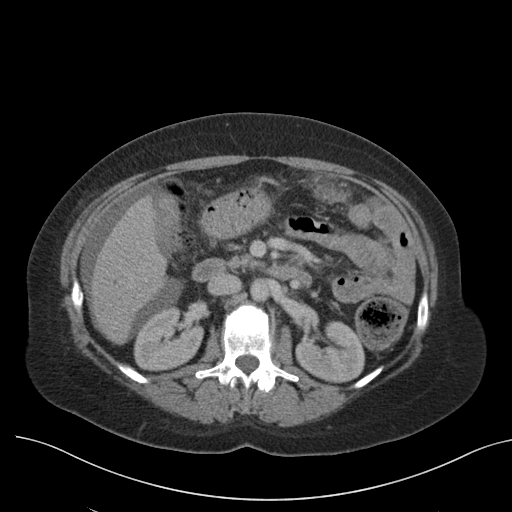
[im 81/100  soft-tissue]
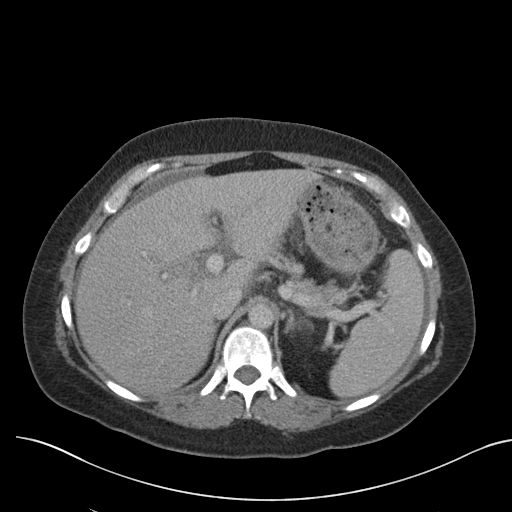
[im 87/100  soft-tissue]
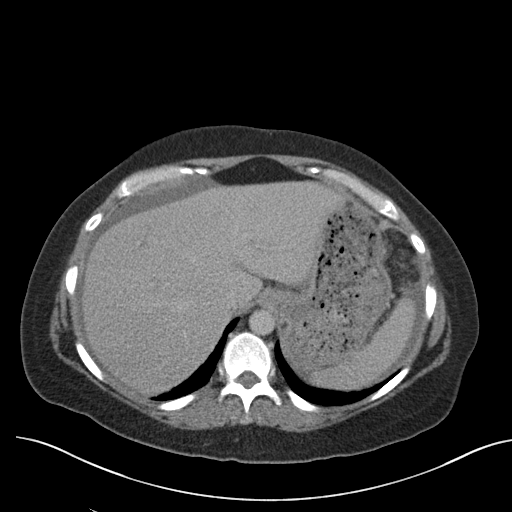
[im 93/100  soft-tissue]
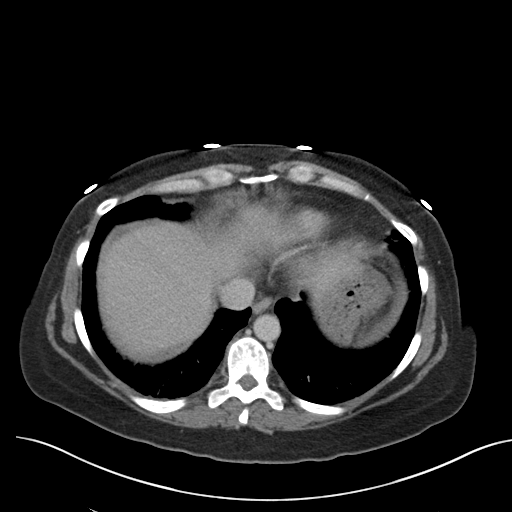

[Series 5: coronal st · coronal · 0.78mm/px · 3 of 138 slices shown]
[im 46/138  soft-tissue]
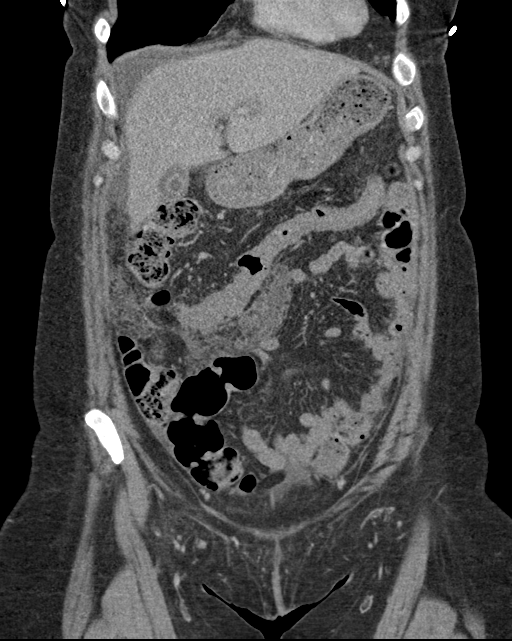
[im 61/138  soft-tissue]
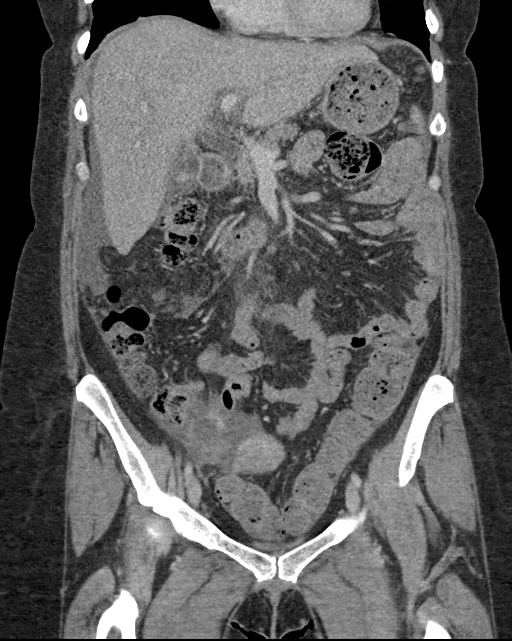
[im 77/138  soft-tissue]
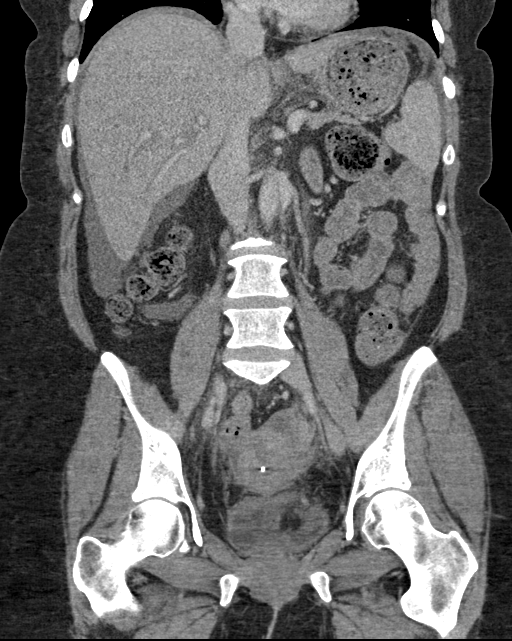

[16 of 46 positions shown; findings below may reference images not displayed]

FINDINGS: Lower chest: Stable scarring right lung base, otherwise the lung
bases are clear.

Hepatobiliary: Stable diffuse wall thickening of the gallbladder. No
evidence of cholelithiasis. The liver is unremarkable.

Pancreas: Unremarkable. No pancreatic ductal dilatation or
surrounding inflammatory changes.

Spleen: Normal in size without focal abnormality.

Adrenals/Urinary Tract: There is a nonobstructing 4 mm calculus
upper pole right kidney, stable. No obstructive uropathy. The
bladder is decompressed which limits evaluation. The adrenals are
normal.

Stomach/Bowel: No bowel obstruction or ileus. Nonspecific wall
thickening across the transverse colon is likely due to peristalsis
and under distension.

The appendix measures up to 6 mm in diameter, with mild
periappendiceal fat stranding. The appearance is equivocal for
appendicitis.

I do not see a discrete duodenal mass.

Vascular/Lymphatic: Adenopathy at the level of the SMA reference
image 29 measures 14 x 15 mm, stable. Multiple other subcentimeter
retroperitoneal lymph nodes are stable.

Vascular structures are grossly unremarkable.

Reproductive: IUD again seen within the uterus. There are no uterine
or adnexal masses.

Other: Small amount of free fluid is seen within the right
hemiabdomen and pelvis. No free gas.

The omental caking seen along the transverse mesocolon is slightly
more pronounced, consistent with peritoneal carcinomatosis.

Musculoskeletal: No acute or destructive bony lesions. Reconstructed
images demonstrate no additional findings.
IMPRESSION: 1. Progressive omental caking consistent with peritoneal metastatic
disease.
2. Periappendiceal fat stranding, though the appendix remains normal
in caliber. Findings are equivocal for appendicitis. Please
correlate with physical exam findings and laboratory evaluation.
3. Stable retroperitoneal lymphadenopathy.
4. Small volume ascites.
